# Patient Record
Sex: Male | Born: 1965 | Race: Black or African American | Hispanic: No | Smoking: Current every day smoker
Health system: Southern US, Community
[De-identification: ages and names within clinical notes are randomized; demographics above are authoritative.]

## PROBLEM LIST (undated history)

## (undated) DIAGNOSIS — J449 Chronic obstructive pulmonary disease, unspecified: Secondary | ICD-10-CM

## (undated) DIAGNOSIS — F32A Depression, unspecified: Secondary | ICD-10-CM

## (undated) DIAGNOSIS — M75101 Unspecified rotator cuff tear or rupture of right shoulder, not specified as traumatic: Secondary | ICD-10-CM

## (undated) DIAGNOSIS — J189 Pneumonia, unspecified organism: Secondary | ICD-10-CM

## (undated) DIAGNOSIS — M7750 Other enthesopathy of unspecified foot: Secondary | ICD-10-CM

## (undated) DIAGNOSIS — I2510 Atherosclerotic heart disease of native coronary artery without angina pectoris: Secondary | ICD-10-CM

## (undated) DIAGNOSIS — K219 Gastro-esophageal reflux disease without esophagitis: Secondary | ICD-10-CM

## (undated) DIAGNOSIS — I10 Essential (primary) hypertension: Secondary | ICD-10-CM

## (undated) DIAGNOSIS — G4730 Sleep apnea, unspecified: Secondary | ICD-10-CM

## (undated) DIAGNOSIS — I219 Acute myocardial infarction, unspecified: Secondary | ICD-10-CM

## (undated) DIAGNOSIS — E119 Type 2 diabetes mellitus without complications: Secondary | ICD-10-CM

## (undated) DIAGNOSIS — M1990 Unspecified osteoarthritis, unspecified site: Secondary | ICD-10-CM

## (undated) DIAGNOSIS — IMO0001 Reserved for inherently not codable concepts without codable children: Secondary | ICD-10-CM

## (undated) DIAGNOSIS — I509 Heart failure, unspecified: Secondary | ICD-10-CM

## (undated) DIAGNOSIS — J45909 Unspecified asthma, uncomplicated: Secondary | ICD-10-CM

## (undated) DIAGNOSIS — F329 Major depressive disorder, single episode, unspecified: Secondary | ICD-10-CM

## (undated) DIAGNOSIS — E785 Hyperlipidemia, unspecified: Secondary | ICD-10-CM

## (undated) DIAGNOSIS — D649 Anemia, unspecified: Secondary | ICD-10-CM

## (undated) HISTORY — DX: Type 2 diabetes mellitus without complications: E11.9

## (undated) HISTORY — PX: CORONARY ANGIOPLASTY: SHX604

## (undated) HISTORY — DX: Other enthesopathy of unspecified foot and ankle: M77.50

## (undated) HISTORY — PX: KNEE SURGERY: SHX244

---

## 2002-12-15 ENCOUNTER — Emergency Department (HOSPITAL_COMMUNITY): Admission: EM | Admit: 2002-12-15 | Discharge: 2002-12-15 | Payer: Self-pay | Admitting: Emergency Medicine

## 2005-01-27 ENCOUNTER — Emergency Department (HOSPITAL_COMMUNITY): Admission: EM | Admit: 2005-01-27 | Discharge: 2005-01-28 | Payer: Self-pay | Admitting: Emergency Medicine

## 2006-05-20 ENCOUNTER — Emergency Department (HOSPITAL_COMMUNITY): Admission: EM | Admit: 2006-05-20 | Discharge: 2006-05-20 | Payer: Self-pay | Admitting: Emergency Medicine

## 2006-08-16 ENCOUNTER — Emergency Department (HOSPITAL_COMMUNITY): Admission: EM | Admit: 2006-08-16 | Discharge: 2006-08-16 | Payer: Self-pay | Admitting: Emergency Medicine

## 2006-09-18 ENCOUNTER — Emergency Department (HOSPITAL_COMMUNITY): Admission: EM | Admit: 2006-09-18 | Discharge: 2006-09-18 | Payer: Self-pay | Admitting: Emergency Medicine

## 2006-09-25 ENCOUNTER — Emergency Department (HOSPITAL_COMMUNITY): Admission: EM | Admit: 2006-09-25 | Discharge: 2006-09-25 | Payer: Self-pay | Admitting: Emergency Medicine

## 2013-09-15 ENCOUNTER — Emergency Department (HOSPITAL_COMMUNITY): Payer: 59

## 2013-09-15 ENCOUNTER — Emergency Department (HOSPITAL_COMMUNITY)
Admission: EM | Admit: 2013-09-15 | Discharge: 2013-09-15 | Disposition: A | Discharge status: Home / Self Care | Payer: 59 | Attending: Emergency Medicine | Admitting: Emergency Medicine

## 2013-09-15 ENCOUNTER — Encounter (HOSPITAL_COMMUNITY): Payer: Self-pay | Admitting: Emergency Medicine

## 2013-09-15 DIAGNOSIS — X500XXA Overexertion from strenuous movement or load, initial encounter: Secondary | ICD-10-CM | POA: Insufficient documentation

## 2013-09-15 DIAGNOSIS — M1710 Unilateral primary osteoarthritis, unspecified knee: Secondary | ICD-10-CM

## 2013-09-15 DIAGNOSIS — M179 Osteoarthritis of knee, unspecified: Secondary | ICD-10-CM

## 2013-09-15 DIAGNOSIS — IMO0002 Reserved for concepts with insufficient information to code with codable children: Secondary | ICD-10-CM | POA: Insufficient documentation

## 2013-09-15 DIAGNOSIS — Y939 Activity, unspecified: Secondary | ICD-10-CM | POA: Insufficient documentation

## 2013-09-15 DIAGNOSIS — S025XXA Fracture of tooth (traumatic), initial encounter for closed fracture: Secondary | ICD-10-CM | POA: Insufficient documentation

## 2013-09-15 DIAGNOSIS — K0889 Other specified disorders of teeth and supporting structures: Secondary | ICD-10-CM

## 2013-09-15 DIAGNOSIS — S8990XA Unspecified injury of unspecified lower leg, initial encounter: Secondary | ICD-10-CM | POA: Insufficient documentation

## 2013-09-15 DIAGNOSIS — Y929 Unspecified place or not applicable: Secondary | ICD-10-CM | POA: Insufficient documentation

## 2013-09-15 DIAGNOSIS — Z79899 Other long term (current) drug therapy: Secondary | ICD-10-CM | POA: Insufficient documentation

## 2013-09-15 DIAGNOSIS — R51 Headache: Secondary | ICD-10-CM | POA: Insufficient documentation

## 2013-09-15 MED ORDER — TRAMADOL HCL 50 MG PO TABS
ORAL_TABLET | ORAL | Status: DC
Start: 2013-09-15 — End: 2014-12-29

## 2013-09-15 MED ORDER — IBUPROFEN 800 MG PO TABS: 800.0000 mg | ORAL_TABLET | Freq: Three times a day (TID) | ORAL | Status: DC

## 2013-09-15 MED ORDER — AMOXICILLIN 500 MG PO CAPS: ORAL_CAPSULE | ORAL | Status: DC

## 2013-09-15 NOTE — ED Provider Notes (Signed)
CSN: [id number]     Arrival date & time 09/15/13  1030 History   First MD Initiated Contact with Patient 09/15/13 1111     Chief Complaint  Patient presents with  . Dental Injury  . Knee Injury   (Consider location/radiation/quality/duration/timing/severity/associated sxs/prior Treatment) HPI Comments: Patient states that he is here not only because of a tooth that had a cavity in and broke, but he also twisted his knee on yesterday December 23. It is of note that the patient has had 2 previous operations on the knee. Patient wants to be reassured that he did not do any new damage to his knee.  Patient is a 47 y.o. male presenting with dental injury. The history is provided by the patient.  Dental Injury The current episode started 1 to 4 weeks ago. The problem occurs intermittently. The problem has been gradually worsening. Associated symptoms include arthralgias and headaches. Pertinent negatives include no abdominal pain, chest pain, chills, coughing, fever or neck pain. Exacerbated by: Eating and chewing. He has tried nothing for the symptoms. The treatment provided no relief.    No past medical history on file. No past surgical history on file. No family history on file. History  Substance Use Topics  . Smoking status: Not on file  . Smokeless tobacco: Not on file  . Alcohol Use: Not on file    Review of Systems  Constitutional: Negative for fever, chills and activity change.       All ROS Neg except as noted in HPI  HENT: Negative for nosebleeds.   Eyes: Negative for photophobia and discharge.  Respiratory: Negative for cough, shortness of breath and wheezing.   Cardiovascular: Negative for chest pain and palpitations.  Gastrointestinal: Negative for abdominal pain and blood in stool.  Genitourinary: Negative for dysuria, frequency and hematuria.  Musculoskeletal: Positive for arthralgias. Negative for back pain and neck pain.  Skin: Negative.   Neurological: Positive for  headaches. Negative for dizziness, seizures and speech difficulty.  Psychiatric/Behavioral: Negative for hallucinations and confusion.    Allergies  Review of patient's allergies indicates no known allergies.  Home Medications   Current Outpatient Rx  Name  Route  Sig  Dispense  Refill  . cloNIDine (CATAPRES) 0.3 MG tablet   Oral   Take 0.3 mg by mouth 2 (two) times daily.         Marland Kitchen FLUoxetine (PROZAC) 20 MG capsule   Oral   Take 20 mg by mouth daily.         . hydrALAZINE (APRESOLINE) 25 MG tablet   Oral   Take 20 mg by mouth 2 (two) times daily.         . hydrochlorothiazide (HYDRODIURIL) 25 MG tablet   Oral   Take 25 mg by mouth daily.         Marland Kitchen lisinopril (PRINIVIL,ZESTRIL) 40 MG tablet   Oral   Take 40 mg by mouth daily.         . metoprolol tartrate (LOPRESSOR) 25 MG tablet   Oral   Take 25 mg by mouth 2 (two) times daily.         Marland Kitchen amoxicillin (AMOXIL) 500 MG capsule      2 po bid with food   28 capsule   0   . ibuprofen (ADVIL,MOTRIN) 800 MG tablet   Oral   Take 1 tablet (800 mg total) by mouth 3 (three) times daily.   21 tablet   0   . traMADol (ULTRAM)  50 MG tablet      1 or 2 po q6h prn pain   20 tablet   0    BP 168/101  Pulse 72  Temp(Src) 98.1 F (36.7 C) (Oral)  Resp 19  Ht 5\' 7"  (1.702 m)  Wt 330 lb (149.687 kg)  BMI 51.67 kg/m2  SpO2 98% Physical Exam  Nursing note and vitals reviewed. Constitutional: He is oriented to person, place, and time. He appears well-developed and well-nourished.  Non-toxic appearance.  HENT:  Head: Normocephalic.  Right Ear: Tympanic membrane and external ear normal.  Left Ear: Tympanic membrane and external ear normal.  The left first premolar is decayed to the gum line. There is mild swelling of the gum there, but no abscess present.  No swelling under the tongue. The airway is patent.  Eyes: EOM and lids are normal. Pupils are equal, round, and reactive to light.  Neck: Normal range of  motion. Neck supple. Carotid bruit is not present.  Cardiovascular: Normal rate, regular rhythm, normal heart sounds, intact distal pulses and normal pulses.   Pulmonary/Chest: Breath sounds normal. No respiratory distress.  Abdominal: Soft. Bowel sounds are normal. There is no tenderness. There is no guarding.  Musculoskeletal: Normal range of motion.  There is soreness with flexion and extension of the left knee. There is crepitus present. There is no noted deformity. The knee is not hot.  Lymphadenopathy:       Head (right side): No submandibular adenopathy present.       Head (left side): No submandibular adenopathy present.    He has no cervical adenopathy.  Neurological: He is alert and oriented to person, place, and time. He has normal strength. No cranial nerve deficit or sensory deficit.  Skin: Skin is warm and dry.  Psychiatric: He has a normal mood and affect. His speech is normal.    ED Course  Procedures (including critical care time) Labs Review Labs Reviewed - No data to display Imaging Review Dg Knee Complete 4 Views Left  09/15/2013   CLINICAL DATA:  Left knee pain following injury  EXAM: LEFT KNEE - COMPLETE 4+ VIEW  COMPARISON:  None.  FINDINGS: Postsurgical changes are noted consistent with prior anterior cruciate repair. Severe degenerative changes are noted with medial joint space narrowing and osteophytic change. No sizable effusion is noted.  IMPRESSION: Chronic changes without acute abnormality.   Electronically Signed   By: Alcide Clever M.D.   On: 09/15/2013 11:30    EKG Interpretation   None       MDM   1. 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 There is decayed to the gum line at the first premolar on the left lower jaw area. No visible abscess. No evidence forLudwig's  angina. The airway is patent.  The patient has had 2 previous surgeries on the left knee, the x-ray of the left knee reveals degenerative changes, but no fracture, dislocation, or effusion.  The patient is treated with Amoxil, ibuprofen, and Ultram. Patient is advised to see his dentist in his primary physician when he returns to [place].  Kathie Dike, PA-C 09/15/13 1222

## 2013-09-15 NOTE — ED Provider Notes (Signed)
Medical screening examination/treatment/procedure(s) were performed by non-physician practitioner and as supervising physician I was immediately available for consultation/collaboration.  EKG Interpretation   None         Renette Hsu L Iria Jamerson, MD 09/15/13 1541 

## 2013-09-15 NOTE — ED Notes (Signed)
Patient reports: -he broke his tooth 2 weeks ago -he has not sought treatment for his tooth -he twisted his knee yesterday -pain to his tooth is 12/10 -pain to his (L) knee is 8/10

## 2014-12-12 ENCOUNTER — Telehealth: Payer: Self-pay | Admitting: Family Medicine

## 2014-12-13 NOTE — Telephone Encounter (Signed)
First available new patient appointment with Chris Powell is in June. There are available appointments with other providers towards the end of April. Left message for patient to call back with his preference.

## 2014-12-15 NOTE — Telephone Encounter (Signed)
Pt given new pt appt with Jannifer Rodneyhristy hawks 5/4 at 10:25. Pt needs to establish care and he is aware to arrive 15 minutes prior to appt with a copy of insurance card and a current list of medications.

## 2014-12-21 ENCOUNTER — Encounter (HOSPITAL_COMMUNITY): Payer: Self-pay | Admitting: Emergency Medicine

## 2014-12-21 ENCOUNTER — Inpatient Hospital Stay (HOSPITAL_COMMUNITY)
Admission: EM | Admit: 2014-12-21 | Discharge: 2014-12-23 | DRG: 247 | Disposition: A | Discharge status: Home / Self Care | Payer: Medicaid Other | Attending: Cardiology | Admitting: Cardiology

## 2014-12-21 DIAGNOSIS — Z79899 Other long term (current) drug therapy: Secondary | ICD-10-CM

## 2014-12-21 DIAGNOSIS — Z6841 Body Mass Index (BMI) 40.0 and over, adult: Secondary | ICD-10-CM

## 2014-12-21 DIAGNOSIS — Z791 Long term (current) use of non-steroidal anti-inflammatories (NSAID): Secondary | ICD-10-CM

## 2014-12-21 DIAGNOSIS — J449 Chronic obstructive pulmonary disease, unspecified: Secondary | ICD-10-CM | POA: Diagnosis present

## 2014-12-21 DIAGNOSIS — F17200 Nicotine dependence, unspecified, uncomplicated: Secondary | ICD-10-CM | POA: Diagnosis present

## 2014-12-21 DIAGNOSIS — Z8249 Family history of ischemic heart disease and other diseases of the circulatory system: Secondary | ICD-10-CM

## 2014-12-21 DIAGNOSIS — I4729 Other ventricular tachycardia: Secondary | ICD-10-CM

## 2014-12-21 DIAGNOSIS — R079 Chest pain, unspecified: Secondary | ICD-10-CM

## 2014-12-21 DIAGNOSIS — J441 Chronic obstructive pulmonary disease with (acute) exacerbation: Secondary | ICD-10-CM | POA: Diagnosis present

## 2014-12-21 DIAGNOSIS — F17210 Nicotine dependence, cigarettes, uncomplicated: Secondary | ICD-10-CM | POA: Diagnosis present

## 2014-12-21 DIAGNOSIS — I249 Acute ischemic heart disease, unspecified: Secondary | ICD-10-CM | POA: Diagnosis present

## 2014-12-21 DIAGNOSIS — J45909 Unspecified asthma, uncomplicated: Secondary | ICD-10-CM | POA: Diagnosis present

## 2014-12-21 DIAGNOSIS — M1990 Unspecified osteoarthritis, unspecified site: Secondary | ICD-10-CM | POA: Diagnosis present

## 2014-12-21 DIAGNOSIS — I10 Essential (primary) hypertension: Secondary | ICD-10-CM | POA: Diagnosis present

## 2014-12-21 DIAGNOSIS — Z9861 Coronary angioplasty status: Secondary | ICD-10-CM

## 2014-12-21 DIAGNOSIS — I214 Non-ST elevation (NSTEMI) myocardial infarction: Principal | ICD-10-CM | POA: Diagnosis present

## 2014-12-21 DIAGNOSIS — E876 Hypokalemia: Secondary | ICD-10-CM | POA: Diagnosis not present

## 2014-12-21 DIAGNOSIS — I472 Ventricular tachycardia: Secondary | ICD-10-CM | POA: Diagnosis present

## 2014-12-21 DIAGNOSIS — Z955 Presence of coronary angioplasty implant and graft: Secondary | ICD-10-CM

## 2014-12-21 DIAGNOSIS — I2510 Atherosclerotic heart disease of native coronary artery without angina pectoris: Secondary | ICD-10-CM | POA: Diagnosis present

## 2014-12-21 HISTORY — DX: Unspecified asthma, uncomplicated: J45.909

## 2014-12-21 HISTORY — DX: Atherosclerotic heart disease of native coronary artery without angina pectoris: I25.10

## 2014-12-21 HISTORY — DX: Unspecified osteoarthritis, unspecified site: M19.90

## 2014-12-21 HISTORY — DX: Gastro-esophageal reflux disease without esophagitis: K21.9

## 2014-12-21 HISTORY — DX: Pneumonia, unspecified organism: J18.9

## 2014-12-21 HISTORY — DX: Reserved for inherently not codable concepts without codable children: IMO0001

## 2014-12-21 HISTORY — DX: Chronic obstructive pulmonary disease, unspecified: J44.9

## 2014-12-21 HISTORY — DX: Essential (primary) hypertension: I10

## 2014-12-21 HISTORY — DX: Depression, unspecified: F32.A

## 2014-12-21 HISTORY — DX: Major depressive disorder, single episode, unspecified: F32.9

## 2014-12-21 HISTORY — DX: Sleep apnea, unspecified: G47.30

## 2014-12-21 NOTE — ED Notes (Signed)
Pt. C/o chest pain starting Saturday. Pt. Reports starting on Vit D 50,000U x3 weeks that he is to take once a week. Pt. Reports being instructed by [facility] to stop taking medication.

## 2014-12-22 ENCOUNTER — Encounter (HOSPITAL_COMMUNITY)
Admission: EM | Disposition: A | Discharge status: Home / Self Care | Payer: Self-pay | Source: Home / Self Care | Attending: Cardiology

## 2014-12-22 ENCOUNTER — Emergency Department (HOSPITAL_COMMUNITY): Payer: Medicaid Other

## 2014-12-22 ENCOUNTER — Encounter (HOSPITAL_COMMUNITY): Payer: Self-pay | Admitting: Cardiovascular Disease

## 2014-12-22 DIAGNOSIS — R7989 Other specified abnormal findings of blood chemistry: Secondary | ICD-10-CM | POA: Diagnosis not present

## 2014-12-22 DIAGNOSIS — M1990 Unspecified osteoarthritis, unspecified site: Secondary | ICD-10-CM | POA: Diagnosis present

## 2014-12-22 DIAGNOSIS — Z8249 Family history of ischemic heart disease and other diseases of the circulatory system: Secondary | ICD-10-CM | POA: Diagnosis not present

## 2014-12-22 DIAGNOSIS — I2510 Atherosclerotic heart disease of native coronary artery without angina pectoris: Secondary | ICD-10-CM | POA: Diagnosis present

## 2014-12-22 DIAGNOSIS — Z791 Long term (current) use of non-steroidal anti-inflammatories (NSAID): Secondary | ICD-10-CM | POA: Diagnosis not present

## 2014-12-22 DIAGNOSIS — I2102 ST elevation (STEMI) myocardial infarction involving left anterior descending coronary artery: Secondary | ICD-10-CM | POA: Diagnosis not present

## 2014-12-22 DIAGNOSIS — Z79899 Other long term (current) drug therapy: Secondary | ICD-10-CM | POA: Diagnosis not present

## 2014-12-22 DIAGNOSIS — I214 Non-ST elevation (NSTEMI) myocardial infarction: Secondary | ICD-10-CM | POA: Diagnosis present

## 2014-12-22 DIAGNOSIS — E876 Hypokalemia: Secondary | ICD-10-CM | POA: Diagnosis present

## 2014-12-22 DIAGNOSIS — I472 Ventricular tachycardia: Secondary | ICD-10-CM | POA: Diagnosis present

## 2014-12-22 DIAGNOSIS — I209 Angina pectoris, unspecified: Secondary | ICD-10-CM

## 2014-12-22 DIAGNOSIS — R9431 Abnormal electrocardiogram [ECG] [EKG]: Secondary | ICD-10-CM

## 2014-12-22 DIAGNOSIS — J449 Chronic obstructive pulmonary disease, unspecified: Secondary | ICD-10-CM | POA: Diagnosis present

## 2014-12-22 DIAGNOSIS — F17210 Nicotine dependence, cigarettes, uncomplicated: Secondary | ICD-10-CM | POA: Diagnosis present

## 2014-12-22 DIAGNOSIS — I213 ST elevation (STEMI) myocardial infarction of unspecified site: Secondary | ICD-10-CM | POA: Diagnosis not present

## 2014-12-22 DIAGNOSIS — J45909 Unspecified asthma, uncomplicated: Secondary | ICD-10-CM | POA: Diagnosis present

## 2014-12-22 DIAGNOSIS — R079 Chest pain, unspecified: Secondary | ICD-10-CM | POA: Diagnosis present

## 2014-12-22 DIAGNOSIS — I249 Acute ischemic heart disease, unspecified: Secondary | ICD-10-CM | POA: Diagnosis present

## 2014-12-22 DIAGNOSIS — I10 Essential (primary) hypertension: Secondary | ICD-10-CM | POA: Diagnosis present

## 2014-12-22 DIAGNOSIS — Z6841 Body Mass Index (BMI) 40.0 and over, adult: Secondary | ICD-10-CM | POA: Diagnosis not present

## 2014-12-22 HISTORY — PX: CORONARY STENT PLACEMENT: SHX1402

## 2014-12-22 HISTORY — PX: CARDIAC CATHETERIZATION: SHX172

## 2014-12-22 HISTORY — PX: LEFT HEART CATHETERIZATION WITH CORONARY ANGIOGRAM: SHX5451

## 2014-12-22 LAB — CBC
HCT: 45.7 % (ref 39.0–52.0)
Hemoglobin: 15.5 g/dL (ref 13.0–17.0)
MCH: 30.8 pg (ref 26.0–34.0)
MCHC: 33.9 g/dL (ref 30.0–36.0)
MCV: 90.7 fL (ref 78.0–100.0)
PLATELETS: 265 10*3/uL (ref 150–400)
RBC: 5.04 MIL/uL (ref 4.22–5.81)
RDW: 14.3 % (ref 11.5–15.5)
WBC: 9.9 10*3/uL (ref 4.0–10.5)

## 2014-12-22 LAB — BASIC METABOLIC PANEL
Anion gap: 9 (ref 5–15)
BUN: 11 mg/dL (ref 6–23)
CHLORIDE: 107 mmol/L (ref 96–112)
CO2: 25 mmol/L (ref 19–32)
CREATININE: 1.15 mg/dL (ref 0.50–1.35)
Calcium: 8.9 mg/dL (ref 8.4–10.5)
GFR calc Af Amer: 85 mL/min — ABNORMAL LOW (ref >90)
GFR calc non Af Amer: 74 mL/min — ABNORMAL LOW (ref >90)
GLUCOSE: 118 mg/dL — AB (ref 70–99)
POTASSIUM: 3.3 mmol/L — AB (ref 3.5–5.1)
Sodium: 141 mmol/L (ref 135–145)

## 2014-12-22 LAB — PROTIME-INR
INR: 1.03 (ref 0.00–1.49)
Prothrombin Time: 13.7 seconds (ref 11.6–15.2)

## 2014-12-22 LAB — TROPONIN I
Troponin I: 0.05 ng/mL — ABNORMAL HIGH (ref <0.031)
Troponin I: 0.07 ng/mL — ABNORMAL HIGH (ref <0.031)

## 2014-12-22 LAB — PLATELET COUNT: PLATELETS: 284 10*3/uL (ref 150–400)

## 2014-12-22 LAB — POCT ACTIVATED CLOTTING TIME
ACTIVATED CLOTTING TIME: 245 s
Activated Clotting Time: 300 seconds

## 2014-12-22 SURGERY — LEFT HEART CATHETERIZATION WITH CORONARY ANGIOGRAM

## 2014-12-22 MED ORDER — VERAPAMIL HCL 2.5 MG/ML IV SOLN
INTRAVENOUS | Status: AC
  Filled 2014-12-22: qty 2

## 2014-12-22 MED ORDER — TIROFIBAN HCL IV 12.5 MG/250 ML
INTRAVENOUS | Status: AC
  Administered 2014-12-22: 12:00:00 22.92 ug/min via INTRAVENOUS
  Filled 2014-12-22: qty 250

## 2014-12-22 MED ORDER — SODIUM CHLORIDE 0.9 % IJ SOLN: 3.0000 mL | INTRAMUSCULAR | Status: DC | PRN

## 2014-12-22 MED ORDER — ASPIRIN 81 MG PO CHEW
81.0000 mg | CHEWABLE_TABLET | ORAL | Status: DC
Start: 2014-12-23 — End: 2014-12-22

## 2014-12-22 MED ORDER — HEPARIN SODIUM (PORCINE) 1000 UNIT/ML IJ SOLN
INTRAMUSCULAR | Status: AC
  Filled 2014-12-22: qty 1

## 2014-12-22 MED ORDER — ONDANSETRON HCL 4 MG/2ML IJ SOLN: 4.0000 mg | Freq: Four times a day (QID) | INTRAMUSCULAR | Status: DC | PRN

## 2014-12-22 MED ORDER — ACETAMINOPHEN 325 MG PO TABS: 650.0000 mg | ORAL_TABLET | ORAL | Status: DC | PRN

## 2014-12-22 MED ORDER — TRAMADOL HCL 50 MG PO TABS
50.0000 mg | ORAL_TABLET | Freq: Four times a day (QID) | ORAL | Status: DC | PRN
  Administered 2014-12-22 (×2): 50 mg via ORAL
  Filled 2014-12-22 (×2): qty 1

## 2014-12-22 MED ORDER — HYDRALAZINE HCL 20 MG/ML IJ SOLN
INTRAMUSCULAR | Status: AC
  Filled 2014-12-22: qty 1

## 2014-12-22 MED ORDER — CLONIDINE HCL 0.3 MG PO TABS
0.3000 mg | ORAL_TABLET | Freq: Two times a day (BID) | ORAL | Status: DC
  Administered 2014-12-22 – 2014-12-23 (×2): 0.3 mg via ORAL
  Filled 2014-12-22 (×3): qty 1

## 2014-12-22 MED ORDER — LIDOCAINE HCL (PF) 1 % IJ SOLN
INTRAMUSCULAR | Status: AC
  Filled 2014-12-22: qty 30

## 2014-12-22 MED ORDER — GI COCKTAIL ~~LOC~~
30.0000 mL | Freq: Once | ORAL | Status: AC
  Administered 2014-12-22: 30 mL via ORAL
  Filled 2014-12-22: qty 30

## 2014-12-22 MED ORDER — SODIUM CHLORIDE 0.9 % IV SOLN: 250.0000 mL | INTRAVENOUS | Status: DC | PRN

## 2014-12-22 MED ORDER — SODIUM CHLORIDE 0.9 % IV SOLN
1.0000 mL/kg/h | INTRAVENOUS | Status: AC
  Administered 2014-12-22: 12:00:00 0.636 mL/kg/h via INTRAVENOUS

## 2014-12-22 MED ORDER — TIROFIBAN HCL IV 5 MG/100ML
22.9200 ug/min | INTRAVENOUS | Status: AC
  Administered 2014-12-22: 22.92 ug/min via INTRAVENOUS
  Filled 2014-12-22: qty 100

## 2014-12-22 MED ORDER — FLUOXETINE HCL 20 MG PO CAPS: 20.0000 mg | ORAL_CAPSULE | Freq: Every day | ORAL | Status: DC

## 2014-12-22 MED ORDER — HEPARIN (PORCINE) IN NACL 2-0.9 UNIT/ML-% IJ SOLN
INTRAMUSCULAR | Status: AC
  Filled 2014-12-22: qty 1000

## 2014-12-22 MED ORDER — SODIUM CHLORIDE 0.9 % IJ SOLN: 3.0000 mL | Freq: Two times a day (BID) | INTRAMUSCULAR | Status: DC

## 2014-12-22 MED ORDER — MIDAZOLAM HCL 2 MG/2ML IJ SOLN
INTRAMUSCULAR | Status: AC
  Filled 2014-12-22: qty 2

## 2014-12-22 MED ORDER — HYDROCHLOROTHIAZIDE 25 MG PO TABS
25.0000 mg | ORAL_TABLET | Freq: Every day | ORAL | Status: DC
  Administered 2014-12-22 – 2014-12-23 (×2): 25 mg via ORAL
  Filled 2014-12-22 (×2): qty 1

## 2014-12-22 MED ORDER — PRASUGREL HCL 10 MG PO TABS
ORAL_TABLET | ORAL | Status: AC
  Filled 2014-12-22: qty 6

## 2014-12-22 MED ORDER — POTASSIUM CHLORIDE CRYS ER 20 MEQ PO TBCR: 40.0000 meq | EXTENDED_RELEASE_TABLET | Freq: Once | ORAL | Status: DC

## 2014-12-22 MED ORDER — FENTANYL CITRATE 0.05 MG/ML IJ SOLN
INTRAMUSCULAR | Status: AC
  Filled 2014-12-22: qty 2

## 2014-12-22 MED ORDER — ANGIOPLASTY BOOK
Freq: Once | Status: AC
  Administered 2014-12-22: 21:00:00
  Filled 2014-12-22: qty 1

## 2014-12-22 MED ORDER — HYDRALAZINE HCL 10 MG PO TABS
20.0000 mg | ORAL_TABLET | Freq: Two times a day (BID) | ORAL | Status: DC
  Administered 2014-12-22 – 2014-12-23 (×2): 20 mg via ORAL
  Filled 2014-12-22 (×3): qty 2

## 2014-12-22 MED ORDER — SODIUM CHLORIDE 0.9 % IV SOLN: INTRAVENOUS | Status: DC

## 2014-12-22 MED ORDER — PRASUGREL HCL 10 MG PO TABS
10.0000 mg | ORAL_TABLET | Freq: Every day | ORAL | Status: DC
  Administered 2014-12-23: 10 mg via ORAL
  Filled 2014-12-22: qty 1

## 2014-12-22 MED ORDER — METOPROLOL TARTRATE 25 MG PO TABS
25.0000 mg | ORAL_TABLET | Freq: Two times a day (BID) | ORAL | Status: DC
  Administered 2014-12-22 – 2014-12-23 (×3): 25 mg via ORAL
  Filled 2014-12-22 (×3): qty 1

## 2014-12-22 MED ORDER — ASPIRIN 81 MG PO CHEW
81.0000 mg | CHEWABLE_TABLET | Freq: Every day | ORAL | Status: DC
  Administered 2014-12-23: 11:00:00 81 mg via ORAL
  Filled 2014-12-22: qty 1

## 2014-12-22 MED ORDER — ASPIRIN 81 MG PO CHEW
162.0000 mg | CHEWABLE_TABLET | Freq: Once | ORAL | Status: AC
  Administered 2014-12-22: 162 mg via ORAL
  Filled 2014-12-22: qty 2

## 2014-12-22 MED ORDER — NITROGLYCERIN 1 MG/10 ML FOR IR/CATH LAB
INTRA_ARTERIAL | Status: AC
  Filled 2014-12-22: qty 10

## 2014-12-22 NOTE — Progress Notes (Signed)
Utilization review completed.  

## 2014-12-22 NOTE — ED Notes (Signed)
Pt. Sleeping. Even rise and fall of chest noted. No distress noted.

## 2014-12-22 NOTE — ED Notes (Signed)
Pt. Given urinal. 

## 2014-12-22 NOTE — ED Provider Notes (Signed)
CSN: [id number]     Arrival date & time 12/21/14  2351 History  This chart was scribed for Chris Racer, MD by [name], ED Scribe. This patient was seen in room [facility]04/[facility]04 and the patient's care was started at 12:11 AM.    Chief Complaint  Patient presents with  . Chest Pain   The history is provided by the patient. No language interpreter was used.    HPI Comments: Chris Powell is a 49 y.o. male with a history of HTN, COPD, asthma and arthritis who presents to the Emergency Department complaining of intermittent, gradually worsening, non-radiating, tight and stabbing chest pain that started 4 days ago and became worse tonight. Pt reports 5 episodes of CP PTA. He states SOB and feeling warm during the episodes as associated symptoms. Pt reports pain becomes worse with lying down. He also notes that initial episodes of pain lasted a few minutes before resolving without treatment, but that they have increased in length and severity in the last few days. Pt's last meal was a hamburger 8 hours ago. Pt reports that he was taking Vitamin D for 2 weeks, but stopped treatment 2 days ago because of the onset of chest tightness. He denies prior evaluations by a cardiologist. Pt denies a family history of CAD <55 y.o., recent surgeries or long travel. He smokes 1/2 ppd. Pt denies nausea as an associated symptoms.  Past Medical History  Diagnosis Date  . Hypertension   . COPD (chronic obstructive pulmonary disease)   . Asthma   . Arthritis    Past Surgical History  Procedure Laterality Date  . Knee surgery     No family history on file. History  Substance Use Topics  . Smoking status: Current Every Day Smoker -- 0.50 packs/day    Types: Cigarettes  . Smokeless tobacco: Not on file  . Alcohol Use: Yes     Comment: occ.    Review of Systems  Constitutional: Negative for fever and chills.  Respiratory: Positive for shortness of breath. Negative for cough.   Cardiovascular:  Positive for chest pain. Negative for palpitations and leg swelling.  Gastrointestinal: Positive for abdominal pain. Negative for nausea, vomiting and diarrhea.  Musculoskeletal: Negative for back pain, neck pain and neck stiffness.  Skin: Negative for wound.  Neurological: Negative for dizziness, weakness, light-headedness, numbness and headaches.  All other systems reviewed and are negative.     Allergies  Review of patient's allergies indicates no known allergies.  Home Medications   Prior to Admission medications   Medication Sig Start Date End Date Taking? Authorizing Provider  cloNIDine (CATAPRES) 0.3 MG tablet Take 0.3 mg by mouth 2 (two) times daily.   Yes Historical Provider, MD  hydrALAZINE (APRESOLINE) 25 MG tablet Take 20 mg by mouth 2 (two) times daily.   Yes Historical Provider, MD  hydrochlorothiazide (HYDRODIURIL) 25 MG tablet Take 25 mg by mouth daily.   Yes Historical Provider, MD  lisinopril (PRINIVIL,ZESTRIL) 40 MG tablet Take 40 mg by mouth daily.   Yes Historical Provider, MD  metoprolol tartrate (LOPRESSOR) 25 MG tablet Take 25 mg by mouth 2 (two) times daily.   Yes Historical Provider, MD  amoxicillin (AMOXIL) 500 MG capsule 2 po bid with food 09/15/13   [name], PA-C  FLUoxetine (PROZAC) 20 MG capsule Take 20 mg by mouth daily.    Historical Provider, MD  ibuprofen (ADVIL,MOTRIN) 800 MG tablet Take 1 tablet (800 mg total) by mouth 3 (three) times daily. 09/15/13  Ivery QualeHobson Bryant, PA-C  traMADol (ULTRAM) 50 MG tablet 1 or 2 po q6h prn pain 09/15/13   Ivery QualeHobson Bryant, PA-C   BP 122/82 mmHg  Pulse 60  Temp(Src) 98 F (36.7 C) (Oral)  Resp 21  Ht 5\' 7"  (1.702 m)  Wt 330 lb (149.687 kg)  BMI 51.67 kg/m2  SpO2 94% Physical Exam  Constitutional: He is oriented to person, place, and time. He appears well-developed and well-nourished. No distress.  Obese  HENT:  Head: Normocephalic and atraumatic.  Mouth/Throat: Oropharynx is clear and moist.  Eyes: EOM  are normal. Pupils are equal, round, and reactive to light.  Neck: Normal range of motion. Neck supple.  Cardiovascular: Normal rate and regular rhythm.   Pulmonary/Chest: Effort normal and breath sounds normal. No respiratory distress. He has no wheezes. He has no rales. He exhibits no tenderness.  Abdominal: Soft. Bowel sounds are normal. He exhibits no distension and no mass. There is tenderness (epigastric tenderness with palpation.). There is no rebound and no guarding.  Musculoskeletal: Normal range of motion. He exhibits no edema or tenderness.  No calf swelling or tenderness.  Neurological: He is alert and oriented to person, place, and time.  Moves all extremities without deficit. Sensation is grossly intact.  Skin: Skin is warm and dry. No rash noted. No erythema.  Psychiatric: He has a normal mood and affect. His behavior is normal.  Nursing note and vitals reviewed.   ED Course  Procedures   DIAGNOSTIC STUDIES: Oxygen Saturation is 96% on RA, normal by my interpretation.    COORDINATION OF CARE: 12:17 AM Discussed treatment plan with pt at bedside and pt agreed to plan.  Labs Review Labs Reviewed  BASIC METABOLIC PANEL - Abnormal; Notable for the following:    Potassium 3.3 (*)    Glucose, Bld 118 (*)    GFR calc non Af Amer 74 (*)    GFR calc Af Amer 85 (*)    All other components within normal limits  TROPONIN I - Abnormal; Notable for the following:    Troponin I 0.05 (*)    All other components within normal limits  TROPONIN I - Abnormal; Notable for the following:    Troponin I 0.07 (*)    All other components within normal limits  CBC    Imaging Review Dg Chest 2 View (if Patient Has Fever And/or Copd)  12/22/2014   CLINICAL DATA:  Sharp central chest pain for 1 week. Initial encounter.  EXAM: CHEST  2 VIEW  COMPARISON:  Chest radiograph performed 05/20/2006  FINDINGS: The lungs are well-aerated. Mild bibasilar opacities may reflect mild interstitial edema,  given underlying vascular congestion. There is no evidence of pleural effusion or pneumothorax.  The heart is borderline normal in size. No acute osseous abnormalities are seen.  IMPRESSION: Mild bibasilar airspace opacities may reflect mild interstitial edema, given underlying vascular congestion.   Electronically Signed   By: Roanna RaiderJeffery  Chang M.D.   On: 12/22/2014 01:14     EKG Interpretation   Date/Time:  Wednesday December 21 2014 23:55:08 EDT Ventricular Rate:  72 PR Interval:  127 QRS Duration: 103 QT Interval:  365 QTC Calculation: 399 R Axis:   64 Text Interpretation:  Sinus rhythm Borderline T wave abnormalities  Confirmed by Ranae PalmsYELVERTON  MD, Keyri Salberg ([id number]) on 12/22/2014 12:49:58 AM      MDM   Final diagnoses:  Chest pain, unspecified chest pain type    I personally performed the services described in this documentation,  which was scribed in my presence. The recorded information has been reviewed and is accurate.  Patient remains asymptomatic in the emergency department. Mild elevation in initial troponin and repeat at 3 hours. EKG with nonspecific T wave abnormalities. Patient has heart score of 5. Discuss with cardiology on-call, Dr. Tresa Endo. Will accept in transfer to telemetry bed for rule out and likely stress testing. Patient is in agreement with plan.   Chris Racer, MD 12/22/14 [id number]

## 2014-12-22 NOTE — Interval H&P Note (Signed)
Cath Lab Visit (complete for each Cath Lab visit)  Clinical Evaluation Leading to the Procedure:   ACS: Yes.    Non-ACS:    Anginal Classification: CCS IV  Anti-ischemic medical therapy: No Therapy  Non-Invasive Test Results: No non-invasive testing performed  Prior CABG: No previous CABG   TIMI Score  Patient Information:  TIMI Score is 4   UA/NSTEMI and intermediate-risk features (e.g., TIMI score 3-4) for short-term risk of death or nonfatal MI  Revascularization of the presumed culprit artery   A (8)  Indication: 10; Score: 8    History and Physical Interval Note:  12/22/2014 9:45 AM  Chris Powell  has presented today for surgery, with the diagnosis of cp  The various methods of treatment have been discussed with the patient and family. After consideration of risks, benefits and other options for treatment, the patient has consented to  Procedure(s): LEFT HEART CATHETERIZATION WITH CORONARY ANGIOGRAM (N/A) as a surgical intervention .  The patient's history has been reviewed, patient examined, no change in status, stable for surgery.  I have reviewed the patient's chart and labs.  Questions were answered to the patient's satisfaction.     [name] S.

## 2014-12-22 NOTE — H&P (Signed)
ADMISSION HISTORY AND PHYSICAL   Date: 12/22/2014               Patient Name:  Chris Powell MRN: [id number]  DOB: [birth year] Age / Sex: 49 y.o., male        PCP: No PCP Per Patient Primary Cardiologist: New / [place]         History of Present Illness: Patient is a 49 y.o. male with a PMHx of HTN, COPD, as, who was admitted to [facility] on 12/21/2014 for evaluation of CHEST PAIN   Pain started 4 days ago, worsened last night. Pain is worse with lying down.  Troponin levels minimally elevated.  ECG shows NSR with TWI in the anterior leads. .   Is on disability for HTN   Pain is worse over the past several days Not worsened with exertion - does not get any regular exercise Not pleuretic, Not positional  Thought it was due to indigestion.  Did not improve with antiacids.  Some increase dyspnea. No sweats.   Smokes 1 ppd - does not intend to quit + family hx of CAD - father died of mi.   Medications: Outpatient medications: Prescriptions prior to admission  Medication Sig Dispense Refill Last Dose  . cloNIDine (CATAPRES) 0.3 MG tablet Take 0.3 mg by mouth 2 (two) times daily.   12/21/2014 at Unknown time  . hydrALAZINE (APRESOLINE) 25 MG tablet Take 20 mg by mouth 2 (two) times daily.   12/21/2014 at Unknown time  . hydrochlorothiazide (HYDRODIURIL) 25 MG tablet Take 25 mg by mouth daily.   12/21/2014 at Unknown time  . lisinopril (PRINIVIL,ZESTRIL) 40 MG tablet Take 40 mg by mouth daily.   12/21/2014 at Unknown time  . metoprolol tartrate (LOPRESSOR) 25 MG tablet Take 25 mg by mouth 2 (two) times daily.   12/21/2014 at Unknown time  . amoxicillin (AMOXIL) 500 MG capsule 2 po bid with food 28 capsule 0   . FLUoxetine (PROZAC) 20 MG capsule Take 20 mg by mouth daily.   09/15/2013  . ibuprofen (ADVIL,MOTRIN) 800 MG tablet Take 1 tablet (800 mg total) by mouth 3 (three) times daily. 21 tablet 0   . traMADol (ULTRAM) 50 MG tablet 1 or 2 po q6h prn pain 20 tablet 0     No Known  Allergies   Past Medical History  Diagnosis Date  . Hypertension   . COPD (chronic obstructive pulmonary disease)   . Asthma   . Arthritis     Past Surgical History  Procedure Laterality Date  . Knee surgery      Family History  Problem Relation Age of Onset  . Heart attack Father     Social History:  reports that he has been smoking Cigarettes.  He has been smoking about 0.50 packs per day. He does not have any smokeless tobacco history on file. He reports that he drinks alcohol. He reports that he uses illicit drugs (Cocaine).   Review of Systems: Constitutional:  denies fever, chills, diaphoresis, appetite change and fatigue.  HEENT: denies photophobia, eye pain, redness, hearing loss, ear pain, congestion, sore throat, rhinorrhea, sneezing, neck pain, neck stiffness and tinnitus.  Respiratory: denies SOB, DOE, cough, chest tightness, and wheezing.  Cardiovascular: admits to chest pain,    Gastrointestinal: denies nausea, vomiting, abdominal pain, diarrhea, constipation, blood in stool.  Genitourinary: denies dysuria, urgency, frequency, hematuria, flank pain and difficulty urinating.  Musculoskeletal: denies  myalgias, back pain, joint swelling, arthralgias and  gait problem.   Skin: denies pallor, rash and wound.  Neurological: denies dizziness, seizures, syncope, weakness, light-headedness, numbness and headaches.   Hematological: denies adenopathy, easy bruising, personal or family bleeding history.  Psychiatric/ Behavioral: denies suicidal ideation, mood changes, confusion, nervousness, sleep disturbance and agitation.    Physical Exam: BP 150/94 mmHg  Pulse 65  Temp(Src) 98.6 F (37 C) (Oral)  Resp 22  Ht 5\' 7"  (1.702 m)  Wt 346 lb 12.8 oz (157.307 kg)  BMI 54.30 kg/m2  SpO2 100%  Wt Readings from Last 3 Encounters:  12/22/14 346 lb 12.8 oz (157.307 kg)  09/15/13 330 lb (149.687 kg)    General: Vital signs reviewed and noted. Well-developed,  well-nourished, in no acute distress; alert,   Head: Normocephalic, atraumatic, sclera anicteric, mucus membranes are moist   Neck: Supple. Negative for carotid bruits. JVD not elevated.   Lungs:  Clear bilaterally to auscultation without wheezes, rales, or rhonchi. Breathing is normal   Heart: RRR with S1 S2. No murmurs, rubs, or gallops.   Abdomen:  Soft, non-tender, non-distended with normoactive bowel sounds. No hepatomegaly. No rebound/guarding. No obvious abdominal masses   MSK: Strength and the appear normal for age.   Extremities: No clubbing or cyanosis. No edema.  Distal pedal pulses are 2+ and equal bilaterally .  Neurologic: Alert and oriented X 3. Moves all extremities spontaneously   Psych:  normal     Lab results: Basic Metabolic Panel:  Recent Labs Lab 12/22/14 0028  NA 141  K 3.3*  CL 107  CO2 25  GLUCOSE 118*  BUN 11  CREATININE 1.15  CALCIUM 8.9    Liver Function Tests: No results for input(s): AST, ALT, ALKPHOS, BILITOT, PROT, ALBUMIN in the last 168 hours. No results for input(s): LIPASE, AMYLASE in the last 168 hours.  CBC:  Recent Labs Lab 12/22/14 0028  WBC 9.9  HGB 15.5  HCT 45.7  MCV 90.7  PLT 265    Cardiac Enzymes:  Recent Labs Lab 12/22/14 0028 12/22/14 0328  TROPONINI 0.05* 0.07*    BNP: Invalid input(s): POCBNP  CBG: No results for input(s): GLUCAP in the last 168 hours.  Coagulation Studies: No results for input(s): LABPROT, INR in the last 72 hours.   Other results: EKG :  Reviewed by me NSR,  TWI in the anterior leads.   Imaging: Dg Chest 2 View (if Patient Has Fever And/or Copd)  12/22/2014   CLINICAL DATA:  Sharp central chest pain for 1 week. Initial encounter.  EXAM: CHEST  2 VIEW  COMPARISON:  Chest radiograph performed 05/20/2006  FINDINGS: The lungs are well-aerated. Mild bibasilar opacities may reflect mild interstitial edema, given underlying vascular congestion. There is no evidence of pleural effusion or  pneumothorax.  The heart is borderline normal in size. No acute osseous abnormalities are seen.  IMPRESSION: Mild bibasilar airspace opacities may reflect mild interstitial edema, given underlying vascular congestion.   Electronically Signed   By: Roanna RaiderJeffery  Chang M.D.   On: 12/22/2014 01:14     Assessment & Plan:  1. Chest discomfort:  Pt has had progressive CP for the past several days.  Now is found to have a minimally + troponin level and mild ECG abn.  Risk factos include smoking, obesity, HTN, family hx.  doesnot get any exercise  I think our best option is to proceed with cath.  I've discussed risks, benefits, options.  He understands and agrees to proceed.   2. Hypokalemia:  Will replace  3. HTN:  Continue meds.  Will adjust as needed.  4. Smoking :  Advised cessation  5.  Morbid obesity:  Will need to work on an improved diet. Hb is on the high side, he may have OSA or obesity hypoventilation.    DVT PPX -    Alvia Grove., MD, [facility] 12/22/2014, 7:56 AM

## 2014-12-22 NOTE — ED Notes (Signed)
EDP at bedside  

## 2014-12-22 NOTE — CV Procedure (Addendum)
PROCEDURE:  Left heart catheterization with selective coronary angiography, PCI LAD.  INDICATIONS:  NSTEMI  The risks, benefits, and details of the procedure were explained to the patient.  The patient verbalized understanding and wanted to proceed.  Informed written consent was obtained.  PROCEDURE TECHNIQUE:  After Xylocaine anesthesia a 29F slender sheath was placed in the right radial artery with a single anterior needle wall stick.   IV Heparin was given.  Right coronary angiography was attempted using a Judkins R4 guide catheter.  We then tried a Williams right catheter but were unsuccessful. We then tried an AL-1 catheter which was unsuccessful. We then tried an AR 1 catheter which was unsuccessful. We then tried an AR-2 catheter which did engage the RCA.  Left coronary angiography was done using an AL1 guide catheter.  Left ventriculography was not done.  Left heart cath was done using the JL3.5 catheter.  The intervention was performed. Please see below for details. A TR band was used for hemostasis.   CONTRAST:  Total of 265 cc.  COMPLICATIONS:  None.    HEMODYNAMICS:  Aortic pressure was 140/94; LV pressure was 138/16; LVEDP 26.  There was no gradient between the left ventricle and aorta.    ANGIOGRAPHIC DATA:   The left main coronary artery is widely patent.  The left anterior descending artery is a large vessel proximally. In the mid vessel, there is a 95% stenosis. There is a segment of more normal vessel followed by an area of moderate stenosis at the origin of the second diagonal which is medium size. The distal LAD appears to have only mild atherosclerosis.  The left circumflex artery is  is a large vessel. There is a large atrial branch which has a mild proximal stenosis. The first significant obtuse marginal is large and widely patent. The remainder of the circumflex is medium-sized and patent.  There appears to be a moderate stenosis before the terminal OM.  The  right coronary artery is  large dominant vessel. It appears to originate from the left cusp. It arises from very close to the left main.  It was best engaged with an AR-2 catheter. There is mild proximal disease. The posterior descending artery is medium-sized and patent. There is only mild disease. The posterior lateral artery is medium-sized and patent  LEFT VENTRICULOGRAM:  Left ventricular angiogram was not done.  LVEDP was 26 mmHg.  IMPRESSIONS:  1. Widely patent  left main coronary artery. 2. Severe disease in the mid  left anterior descending artery with moderate disease further distal. The 95% stenosis was treated with a 2.5 x 24 Synergy drug-eluting stent, postdilated with a 3.0 noncompliant balloon. 3. Mild to moderate disease in the  left circumflex artery and its branches. 4.  Mild to moderate disease in the right coronary artery.  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 5. Left ventricular systolic function not assessed . Elevated LVEDP 26 mmHg.    RECOMMENDATION:  Overall difficult cardiac cath from the right radial approach due to difficulty in locating the left main and RCA. He has a short ascending aorta.  Successful PCI of the culprit vessel LAD. Will use Effient since compliance may be an issue. We stress risk factor modification including avoiding illegal drugs as well as tobacco. He'll need weight loss. He will need an echocardiogram to assess left ventricular function.  He may need Lasix in AM.  Shorter course of  post cath fluid due to elevated LVEDP.  Cardiology follow-up with Dr. Elease HashimotoNahser or at [place] office, depending on his convenience.

## 2014-12-23 ENCOUNTER — Other Ambulatory Visit: Payer: Self-pay | Admitting: Cardiology

## 2014-12-23 DIAGNOSIS — E876 Hypokalemia: Secondary | ICD-10-CM

## 2014-12-23 DIAGNOSIS — I249 Acute ischemic heart disease, unspecified: Secondary | ICD-10-CM

## 2014-12-23 DIAGNOSIS — I10 Essential (primary) hypertension: Secondary | ICD-10-CM | POA: Diagnosis present

## 2014-12-23 DIAGNOSIS — Z9861 Coronary angioplasty status: Secondary | ICD-10-CM

## 2014-12-23 DIAGNOSIS — Z720 Tobacco use: Secondary | ICD-10-CM

## 2014-12-23 DIAGNOSIS — I213 ST elevation (STEMI) myocardial infarction of unspecified site: Secondary | ICD-10-CM

## 2014-12-23 DIAGNOSIS — I4729 Other ventricular tachycardia: Secondary | ICD-10-CM

## 2014-12-23 DIAGNOSIS — I2510 Atherosclerotic heart disease of native coronary artery without angina pectoris: Secondary | ICD-10-CM

## 2014-12-23 DIAGNOSIS — I472 Ventricular tachycardia: Secondary | ICD-10-CM

## 2014-12-23 DIAGNOSIS — J441 Chronic obstructive pulmonary disease with (acute) exacerbation: Secondary | ICD-10-CM | POA: Diagnosis present

## 2014-12-23 DIAGNOSIS — F17200 Nicotine dependence, unspecified, uncomplicated: Secondary | ICD-10-CM | POA: Diagnosis present

## 2014-12-23 DIAGNOSIS — I214 Non-ST elevation (NSTEMI) myocardial infarction: Principal | ICD-10-CM

## 2014-12-23 LAB — BASIC METABOLIC PANEL
Anion gap: 7 (ref 5–15)
BUN: 9 mg/dL (ref 6–23)
CHLORIDE: 103 mmol/L (ref 96–112)
CO2: 29 mmol/L (ref 19–32)
CREATININE: 1.16 mg/dL (ref 0.50–1.35)
Calcium: 8.9 mg/dL (ref 8.4–10.5)
GFR calc Af Amer: 84 mL/min — ABNORMAL LOW (ref >90)
GFR calc non Af Amer: 73 mL/min — ABNORMAL LOW (ref >90)
GLUCOSE: 138 mg/dL — AB (ref 70–99)
Potassium: 2.8 mmol/L — ABNORMAL LOW (ref 3.5–5.1)
Sodium: 139 mmol/L (ref 135–145)

## 2014-12-23 LAB — CBC
HCT: 44.2 % (ref 39.0–52.0)
Hemoglobin: 14.9 g/dL (ref 13.0–17.0)
MCH: 30.3 pg (ref 26.0–34.0)
MCHC: 33.7 g/dL (ref 30.0–36.0)
MCV: 89.8 fL (ref 78.0–100.0)
Platelets: 234 10*3/uL (ref 150–400)
RBC: 4.92 MIL/uL (ref 4.22–5.81)
RDW: 14.4 % (ref 11.5–15.5)
WBC: 8 10*3/uL (ref 4.0–10.5)

## 2014-12-23 LAB — LIPID PANEL
Cholesterol: 114 mg/dL (ref 0–200)
HDL: 31 mg/dL — ABNORMAL LOW (ref >39)
LDL Cholesterol: 65 mg/dL (ref 0–99)
Total CHOL/HDL Ratio: 3.7 RATIO
Triglycerides: 91 mg/dL (ref <150)
VLDL: 18 mg/dL (ref 0–40)

## 2014-12-23 MED ORDER — POTASSIUM CHLORIDE CRYS ER 20 MEQ PO TBCR
40.0000 meq | EXTENDED_RELEASE_TABLET | Freq: Once | ORAL | Status: AC
  Administered 2014-12-23: 11:00:00 40 meq via ORAL
  Filled 2014-12-23: qty 2

## 2014-12-23 MED ORDER — FUROSEMIDE 40 MG PO TABS
40.0000 mg | ORAL_TABLET | Freq: Once | ORAL | Status: AC
  Administered 2014-12-23: 40 mg via ORAL
  Filled 2014-12-23: qty 1

## 2014-12-23 MED ORDER — ACETAMINOPHEN 325 MG PO TABS: 650.0000 mg | ORAL_TABLET | ORAL | Status: DC | PRN

## 2014-12-23 MED ORDER — ASPIRIN 81 MG PO CHEW: 81.0000 mg | CHEWABLE_TABLET | Freq: Every day | ORAL | Status: DC

## 2014-12-23 MED ORDER — POTASSIUM CHLORIDE CRYS ER 20 MEQ PO TBCR
40.0000 meq | EXTENDED_RELEASE_TABLET | Freq: Once | ORAL | Status: AC
Start: 2014-12-23 — End: 2014-12-23
  Administered 2014-12-23: 14:00:00 40 meq via ORAL
  Filled 2014-12-23: qty 2

## 2014-12-23 MED ORDER — IBUPROFEN 800 MG PO TABS: 400.0000 mg | ORAL_TABLET | Freq: Three times a day (TID) | ORAL | Status: DC

## 2014-12-23 MED ORDER — LIVING WELL WITH DIABETES BOOK
Freq: Once | Status: DC
  Filled 2014-12-23: qty 1

## 2014-12-23 MED ORDER — ATORVASTATIN CALCIUM 80 MG PO TABS: 80.0000 mg | ORAL_TABLET | Freq: Every day | ORAL | Status: DC

## 2014-12-23 MED ORDER — NITROGLYCERIN 0.4 MG SL SUBL: 0.4000 mg | SUBLINGUAL_TABLET | SUBLINGUAL | Status: DC | PRN

## 2014-12-23 MED ORDER — ATORVASTATIN CALCIUM 80 MG PO TABS
80.0000 mg | ORAL_TABLET | Freq: Every day | ORAL | Status: DC
  Filled 2014-12-23: qty 1

## 2014-12-23 MED ORDER — PRASUGREL HCL 10 MG PO TABS: 10.0000 mg | ORAL_TABLET | Freq: Every day | ORAL | Status: DC

## 2014-12-23 MED ORDER — POTASSIUM CHLORIDE CRYS ER 20 MEQ PO TBCR: 20.0000 meq | EXTENDED_RELEASE_TABLET | Freq: Two times a day (BID) | ORAL | Status: DC

## 2014-12-23 MED ORDER — LISINOPRIL 40 MG PO TABS
80.0000 mg | ORAL_TABLET | Freq: Every day | ORAL | Status: DC
Start: 2014-12-23 — End: 2015-11-22

## 2014-12-23 MED ORDER — FUROSEMIDE 10 MG/ML IJ SOLN: 40.0000 mg | Freq: Once | INTRAMUSCULAR | Status: DC

## 2014-12-23 MED ORDER — FUROSEMIDE 40 MG PO TABS: 40.0000 mg | ORAL_TABLET | Freq: Every day | ORAL | Status: DC

## 2014-12-23 NOTE — Discharge Instructions (Signed)
Coronary Angiogram With Stent, Care After Refer to this sheet in the next few weeks. These instructions provide you with information on caring for yourself after your procedure. Your health care provider may also give you more specific instructions. Your treatment has been planned according to current medical practices, but problems sometimes occur. Call your health care provider if you have any problems or questions after your procedure.  WHAT TO EXPECT AFTER THE PROCEDURE  The insertion site may be tender for a few days after your procedure. HOME CARE INSTRUCTIONS   Take medicines only as directed by your health care provider. Blood thinners may be prescribed after your procedure to improve blood flow through the stent.  Change any bandages (dressings) as directed by your health care provider.   Check your insertion site every day for redness, swelling, or fluid leaking from the insertion.   Do not take baths, swim, or use a hot tub until your health care provider approves. You may shower. Pat the insertion area dry. Do not rub the insertion area with a washcloth or towel.   Eat a heart-healthy diet. This should include plenty of fresh fruits and vegetables. Meat should be lean cuts. Avoid the following types of food:   Food that is high in salt.   Canned or highly processed food.   Food that is high in saturated fat or sugar.   Fried food.   Make any other lifestyle changes recommended by your health care provider. This may include:   Not using any tobacco products including cigarettes, chewing tobacco, or electronic cigarettes.  Managing your weight.   Getting regular exercise.   Managing your blood pressure.   Limiting your alcohol intake.   Managing other health problems, such as diabetes.   If you need an MRI after your heart stent was placed, be sure to tell the health care provider who orders the MRI that you have a heart stent.   Keep all follow-up  visits as directed by your health care provider.  SEEK IMMEDIATE MEDICAL CARE IF:   You develop chest pain, shortness of breath, feel faint, or pass out.  You have bleeding, swelling larger than a walnut, or drainage from the catheter insertion site.  You develop pain, discoloration, coldness, or severe bruising in the leg or arm that held the catheter.  You develop bleeding from any other place such as from the bowels. There may be bright red blood in the urine or stools, or it may appear as black, tarry stools.  You have a fever or chills. MAKE SURE YOU:  Understand these instructions.  Will watch your condition.  Will get help right away if you are not doing well or get worse. Document Released: 03/29/2005 Document Revised: 01/24/2014 Document Reviewed: 02/10/2013 [facility] Patient Information 2015 [employer] [place]. This information is not intended to replace advice given to you by your health care provider. Make sure you discuss any questions you have with your health care provider.   Have BMP drawn next Thursday

## 2014-12-23 NOTE — Discharge Summary (Signed)
Patient ID: Chris Powell,  MRN: [id number], DOB/AGE: 49/04/1966 49 y.o.  Admit date: 12/21/2014 Discharge date: 12/23/2014  Primary Care Provider: No PCP Per Patient Primary Cardiologist: Dr Elease HashimotoNahser  Discharge Diagnoses Principal Problem:   Acute coronary syndrome Active Problems:   CAD - S/P LAD DES 3/11/22/14   Morbid obesity-BMI 51   COPD (chronic obstructive pulmonary disease)   HTN (hypertension)   Non-sustained ventricular tachycardia   Hypokalemia   Smoking    Procedures: Coronary angiogram and PCI 12/22/14   Hospital Course:  49 y.o. morbidly obese, AA male with a PMHx of HTN, COPD, smoker, admitted 12/21/14 with ACS. Troponin peak 0.07. Cath done 12/22/14 revealed LAD disease treated with DES.  Echo done showed moderate LVH with an EF of 55-65%. Post cath his K+ was low and this was treated. His medications were adjusted and Dr Eldridge DaceVaranasi feels he can be discharged 12/23/14. He'll need a BMP in a week. At some point we'll need to consider an OP sleep study.   Discharge Vitals:  Blood pressure 179/77, pulse 57, temperature 98.4 F (36.9 C), temperature source Oral, resp. rate 18, height 5\' 7"  (1.702 m), weight 346 lb 9 oz (157.2 kg), SpO2 99 %.    Labs: Results for orders placed or performed during the hospital encounter of 12/21/14 (from the past 24 hour(s))  Platelet count     Status: None   Collection Time: 12/22/14  5:21 PM  Result Value Ref Range   Platelets 284 150 - 400 K/uL  Basic metabolic panel     Status: Abnormal   Collection Time: 12/23/14  3:18 AM  Result Value Ref Range   Sodium 139 135 - 145 mmol/L   Potassium 2.8 (L) 3.5 - 5.1 mmol/L   Chloride 103 96 - 112 mmol/L   CO2 29 19 - 32 mmol/L   Glucose, Bld 138 (H) 70 - 99 mg/dL   BUN 9 6 - 23 mg/dL   Creatinine, Ser 1.911.16 0.50 - 1.35 mg/dL   Calcium 8.9 8.4 - 47.810.5 mg/dL   GFR calc non Af Amer 73 (L) >90 mL/min   GFR calc Af Amer 84 (L) >90 mL/min   Anion gap 7 5 - 15  CBC     Status: None   Collection Time: 12/23/14  3:18 AM  Result Value Ref Range   WBC 8.0 4.0 - 10.5 K/uL   RBC 4.92 4.22 - 5.81 MIL/uL   Hemoglobin 14.9 13.0 - 17.0 g/dL   HCT 29.544.2 62.139.0 - 30.852.0 %   MCV 89.8 78.0 - 100.0 fL   MCH 30.3 26.0 - 34.0 pg   MCHC 33.7 30.0 - 36.0 g/dL   RDW 65.714.4 84.611.5 - 96.215.5 %   Platelets 234 150 - 400 K/uL  Lipid panel     Status: Abnormal   Collection Time: 12/23/14  7:30 AM  Result Value Ref Range   Cholesterol 114 0 - 200 mg/dL   Triglycerides 91 <952<150 mg/dL   HDL 31 (L) >84>39 mg/dL   Total CHOL/HDL Ratio 3.7 RATIO   VLDL 18 0 - 40 mg/dL   LDL Cholesterol 65 0 - 99 mg/dL    Disposition:  Follow-up Information    Follow up with Nahser, Deloris PingPhilip J, MD.   Specialty:  Cardiology   Why:  office will call you   Contact information:   [address] [place] [address] [phone]       Discharge Medications:  Medication List    STOP taking these medications        amoxicillin 500 MG capsule  Commonly known as:  AMOXIL     hydrochlorothiazide 25 MG tablet  Commonly known as:  HYDRODIURIL      TAKE these medications        acetaminophen 325 MG tablet  Commonly known as:  TYLENOL  Take 2 tablets (650 mg total) by mouth every 4 (four) hours as needed for headache or mild pain.     aspirin 81 MG chewable tablet  Chew 1 tablet (81 mg total) by mouth daily.     atorvastatin 80 MG tablet  Commonly known as:  LIPITOR  Take 1 tablet (80 mg total) by mouth daily at 6 PM.     cloNIDine 0.3 MG tablet  Commonly known as:  CATAPRES  Take 0.3 mg by mouth 2 (two) times daily.     furosemide 40 MG tablet  Commonly known as:  LASIX  Take 1 tablet (40 mg total) by mouth daily.  Start taking on:  12/24/2014     hydrALAZINE 25 MG tablet  Commonly known as:  APRESOLINE  Take 20 mg by mouth 2 (two) times daily.     ibuprofen 800 MG tablet  Commonly known as:  ADVIL,MOTRIN  Take 0.5 tablets (400 mg total) by mouth 3 (three) times daily.     lisinopril  40 MG tablet  Commonly known as:  PRINIVIL,ZESTRIL  Take 2 tablets (80 mg total) by mouth daily.     metoprolol tartrate 25 MG tablet  Commonly known as:  LOPRESSOR  Take 25 mg by mouth 2 (two) times daily.     nitroGLYCERIN 0.4 MG SL tablet  Commonly known as:  NITROSTAT  Place 1 tablet (0.4 mg total) under the tongue every 5 (five) minutes as needed for chest pain.     potassium chloride SA 20 MEQ tablet  Commonly known as:  K-DUR,KLOR-CON  Take 1 tablet (20 mEq total) by mouth 2 (two) times daily.  Start taking on:  12/24/2014     prasugrel 10 MG Tabs tablet  Commonly known as:  EFFIENT  Take 1 tablet (10 mg total) by mouth daily.     traMADol 50 MG tablet  Commonly known as:  ULTRAM  1 or 2 po q6h prn pain         Duration of Discharge Encounter: Greater than 30 minutes including physician time.  Signed, Corine Shelter PA-C 12/23/2014 12:46 PM    I have examined the patient and reviewed assessment and plan and discussed with patient.  Agree with above as stated.  Cr. Stable post cath. No CP.  Replace potassium. He'll need aggressive secondary prevention. We spoke about the importance of dual antiplatelet therapy. He does not appear volume overloaded. 2+ right radial pulse.  Switch diuretic to Lasix.   Saphyre Cillo S.

## 2014-12-23 NOTE — Progress Notes (Signed)
CARDIAC REHAB PHASE I   PRE:  Rate/Rhythm: 65 SR  BP:  Supine: 171/85  Sitting:   Standing:    SaO2: 98 RA  MODE:  Ambulation: 1000 ft   POST:  Rate/Rhythm: 86 SR  BP:  Supine:   Sitting: 203/108 recheck 187/107  Standing:    SaO2: 98 RA 0930-1043 Pt tolerated ambulation well without c/o of cp or SOB. BP elevated before and after walk. Completed MI and stent education with pt. He voices understanding. Pt agrees to Outpt. CRP in [place], will send referral.Discussed smoking cessation with pt. I gave him cessation information. Pt has been nicotine patches for the last 3 months. He states that he was down to 5 cigarettes per day. He took off the patch and would wait a hour before he smoked. Pt admits that he knows he needs to quit. I strongly encouraged cessation.  Beatrix FettersHughes, Rene Gonsoulin G, RN 12/23/2014 10:51 AM    Melina CopaLisa Cru Kritikos RN 12/23/2014 10:44 AM

## 2014-12-23 NOTE — Progress Notes (Signed)
  Echocardiogram 2D Echocardiogram has been performed.  Chris Powell 12/23/2014, 9:19 AM

## 2014-12-23 NOTE — Progress Notes (Addendum)
    Subjective:  Up in chair, no complaints this am  Objective:  Vital Signs in the last 24 hours: Temp:  [98.2 F (36.8 C)-98.5 F (36.9 C)] 98.5 F (36.9 C) (04/01 91470637) Pulse Rate:  [55-72] 61 (04/01 0637) Resp:  [15-25] 20 (04/01 0637) BP: (149-187)/(73-99) 156/83 mmHg (04/01 0637) SpO2:  [96 %-100 %] 99 % (04/01 0637) Weight:  [346 lb 9 oz (157.2 kg)] 346 lb 9 oz (157.2 kg) (04/01 0034)  Intake/Output from previous day:  Intake/Output Summary (Last 24 hours) at 12/23/14 0652 Last data filed at 12/23/14 0035  Gross per 24 hour  Intake 2374.6 ml  Output   1500 ml  Net  874.6 ml    Physical Exam: General appearance: alert, cooperative, no distress and morbidly obese Lungs: decreased breath sounds, scattered wheezing Heart: regular rate and rhythm Extremities: Rt radial site without hematoma   Rate: 60  Rhythm: normal sinus rhythm and 17 beat run of WCT  Lab Results:  Recent Labs  12/22/14 0028 12/22/14 1721 12/23/14 0318  WBC 9.9  --  8.0  HGB 15.5  --  14.9  PLT 265 284 234    Recent Labs  12/22/14 0028 12/23/14 0318  NA 141 139  K 3.3* 2.8*  CL 107 103  CO2 25 29  GLUCOSE 118* 138*  BUN 11 9  CREATININE 1.15 1.16    Recent Labs  12/22/14 0028 12/22/14 0328  TROPONINI 0.05* 0.07*    Recent Labs  12/22/14 0903  INR 1.03    Imaging: Imaging results have been reviewed  Cardiac Studies:  Assessment/Plan:  49 y.o. Morbidly obese, AA male with a PMHx of HTN, COPD, smoker, admitted 12/21/14 with ACS. Troponin peak 0.07. Cath done 12/22/14 revealed LAD disease treated with DES.    Principal Problem:   Acute coronary syndrome Active Problems:   CAD - S/P LAD DES 3/11/22/14   Morbid obesity-BMI 51   COPD (chronic obstructive pulmonary disease)   HTN (hypertension)   Non-sustained ventricular tachycardia   Hypokalemia   Smoking   PLAN: Pt has K+ 2.8 this am- replacement ordered. Echo pending (no LV gram done at cath). IV Lasix was  mentioned for elevated LVEDP at cath but I don't see that it was ordered. Hard to tell by exam if he if fluid overloaded. Run of NSWCT on telemetry in setting of low K+. He is on beta blocker. Will add statin and check lipids. Possibly home later today after echo reviewed. BS elevated, check Hgb A1c  Quintella BatonLuke Kilroy PA-C [facility] [phone] 12/23/2014, 6:52 AM   I have examined the patient and reviewed assessment and plan and discussed with patient.  Agree with above as stated.  Cr. Stable post cath. No CP. Walk with rehab.  Await echo results. If he does have low EF, we'll give a dose of IV Lasix. Replace potassium. He'll need aggressive secondary prevention. We spoke about the importance of dual antiplatelet therapy. He does not appear volume overloaded.  2+ right radial pulse.  Melissa Tomaselli S.

## 2014-12-24 LAB — HEMOGLOBIN A1C
Hgb A1c MFr Bld: 5.8 % — ABNORMAL HIGH (ref 4.8–5.6)
Mean Plasma Glucose: 120 mg/dL

## 2014-12-29 ENCOUNTER — Encounter (HOSPITAL_COMMUNITY)
Admission: RE | Admit: 2014-12-29 | Discharge: 2014-12-29 | Disposition: A | Discharge status: Home / Self Care | Payer: Medicaid Other | Source: Ambulatory Visit | Attending: Cardiovascular Disease | Admitting: Cardiovascular Disease

## 2014-12-29 ENCOUNTER — Encounter (HOSPITAL_COMMUNITY): Payer: Self-pay

## 2014-12-29 VITALS — BP 116/74 | HR 62 | Ht 67.0 in | Wt 346.4 lb

## 2014-12-29 DIAGNOSIS — Z955 Presence of coronary angioplasty implant and graft: Secondary | ICD-10-CM

## 2014-12-29 DIAGNOSIS — I214 Non-ST elevation (NSTEMI) myocardial infarction: Secondary | ICD-10-CM

## 2014-12-29 NOTE — Progress Notes (Signed)
Cardiac/Pulmonary Rehab Medication Review by a Pharmacist  Does the patient  feel that his/her medications are working for him/her?  yes  Has the patient been experiencing any side effects to the medications prescribed?  no  Does the patient measure his/her own blood pressure or blood glucose at home?  yes   Does the patient have any problems obtaining medications due to transportation or finances?   no  Understanding of regimen: excellent Understanding of indications: excellent Potential of compliance: excellent  Questions asked to Determine Patient Understanding of Medication Regimen:  1. What is the name of the medication?  2. What is the medication used for?  3. When should it be taken?  4. How much should be taken?  5. How will you take it?  6. What side effects should you report?  Understanding Defined as: Excellent: All questions above are correct Good: Questions 1-4 are correct Fair: Questions 1-2 are correct  Poor: 1 or none of the above questions are correct   Pharmacist comments: Pt states he is not having any problems with medication.  No side effects reported.    Valrie HartHall, Lerin Jech A 12/29/2014 3:38 PM

## 2014-12-29 NOTE — Progress Notes (Signed)
Patient referred to Cardiac Rehab by Dr. Elease HashimotoNahser due to s/p stent placement, I 21.4 and NSTEMI Z 95.5.  Dr. Elease HashimotoNahser is his cardiologist and Dr. Christell ConstantMoore is his PCP.  During orientation advised patient on arrival and appointment times what to wear, what to do before, during and after exercise.  Reviewed attendance and class policy.  Talked about inclement weather and class consultation policy. Patient is scheduled to start cardiac Rehab on 01/02/15 at 0930.  Patient was advised to come to class 15 minutes before class starts.  He was also given instructions on meeting with the dietician and attending the Family Structure classes. Pt is eager to get started.  Pt was able to complete 6 min walk test.

## 2014-12-29 NOTE — Progress Notes (Signed)
Pt has finished orientation and is scheduled to start CR on 01/02/15 at 0930. Pt has been instructed to arrive to class 15 minutes early for scheduled class. Pt has been instructed to wear comfortable clothing and shoes with rubber soles. Pt has been told to take their medications 1 hour prior to coming to class.  If the patient is not going to attend class, he has been instructed to call.

## 2015-01-02 ENCOUNTER — Encounter (HOSPITAL_COMMUNITY)
Admission: RE | Admit: 2015-01-02 | Discharge: 2015-01-02 | Disposition: A | Discharge status: Home / Self Care | Payer: Medicaid Other | Source: Ambulatory Visit | Attending: Cardiovascular Disease | Admitting: Cardiovascular Disease

## 2015-01-02 DIAGNOSIS — I214 Non-ST elevation (NSTEMI) myocardial infarction: Secondary | ICD-10-CM | POA: Diagnosis not present

## 2015-01-04 ENCOUNTER — Encounter (HOSPITAL_COMMUNITY)
Admission: RE | Admit: 2015-01-04 | Discharge: 2015-01-04 | Disposition: A | Discharge status: Home / Self Care | Payer: Medicaid Other | Source: Ambulatory Visit | Attending: Cardiovascular Disease | Admitting: Cardiovascular Disease

## 2015-01-04 DIAGNOSIS — I214 Non-ST elevation (NSTEMI) myocardial infarction: Secondary | ICD-10-CM | POA: Diagnosis not present

## 2015-01-06 ENCOUNTER — Encounter (HOSPITAL_COMMUNITY)
Admission: RE | Admit: 2015-01-06 | Discharge: 2015-01-06 | Disposition: A | Discharge status: Home / Self Care | Payer: Medicaid Other | Source: Ambulatory Visit | Attending: Cardiovascular Disease | Admitting: Cardiovascular Disease

## 2015-01-06 DIAGNOSIS — I214 Non-ST elevation (NSTEMI) myocardial infarction: Secondary | ICD-10-CM | POA: Diagnosis not present

## 2015-01-06 NOTE — Progress Notes (Signed)
Cardiac Rehabilitation Program Outcomes Report   Orientation:  12/29/14 Graduate Date:  tbd Discharge Date:  tbd # of sessions completed: 3  Cardiologist: Nahser Family MD:  Keene BreathMoore Class Time:  0930  A.  Exercise Program:  Tolerates exercise @ 3.40 METS for 15 minutes and Walk Test Results:  Pre: 2.89 mets  B.  Mental Health:  Good mental attitude  C.  Education/Instruction/Skills  Knows THR for exercise  Uses Perceived Exertion Scale and/or Dyspnea Scale  D.  Nutrition/Weight Control/Body Composition:  Adherence to prescribed nutrition program: fair    E.  Blood Lipids    Lab Results  Component Value Date   CHOL 114 12/23/2014   HDL 31* 12/23/2014   LDLCALC 65 12/23/2014   TRIG 91 12/23/2014   CHOLHDL 3.7 12/23/2014    F.  Lifestyle Changes:  Continues to smoke  G.  Symptoms noted with exercise:  Asymptomatic  Report Completed By:  Doretha Sou Garland Smouse RN   Comments:  This is patients first week progress note.

## 2015-01-09 ENCOUNTER — Encounter (HOSPITAL_COMMUNITY)
Admission: RE | Admit: 2015-01-09 | Discharge: 2015-01-09 | Disposition: A | Discharge status: Home / Self Care | Payer: Medicaid Other | Source: Ambulatory Visit | Attending: Cardiovascular Disease | Admitting: Cardiovascular Disease

## 2015-01-09 DIAGNOSIS — I214 Non-ST elevation (NSTEMI) myocardial infarction: Secondary | ICD-10-CM | POA: Diagnosis not present

## 2015-01-11 ENCOUNTER — Encounter (HOSPITAL_COMMUNITY)
Admission: RE | Admit: 2015-01-11 | Discharge: 2015-01-11 | Disposition: A | Discharge status: Home / Self Care | Payer: Medicaid Other | Source: Ambulatory Visit | Attending: Cardiovascular Disease | Admitting: Cardiovascular Disease

## 2015-01-11 DIAGNOSIS — I214 Non-ST elevation (NSTEMI) myocardial infarction: Secondary | ICD-10-CM | POA: Diagnosis not present

## 2015-01-13 ENCOUNTER — Encounter (HOSPITAL_COMMUNITY): Payer: Medicaid Other

## 2015-01-16 ENCOUNTER — Encounter (HOSPITAL_COMMUNITY): Payer: Medicaid Other

## 2015-01-16 ENCOUNTER — Ambulatory Visit (INDEPENDENT_AMBULATORY_CARE_PROVIDER_SITE_OTHER): Payer: Medicaid Other | Admitting: Physician Assistant

## 2015-01-16 ENCOUNTER — Encounter: Payer: Self-pay | Admitting: Physician Assistant

## 2015-01-16 VITALS — BP 140/98 | HR 67 | Ht 67.0 in | Wt 339.0 lb

## 2015-01-16 DIAGNOSIS — I25811 Atherosclerosis of native coronary artery of transplanted heart without angina pectoris: Secondary | ICD-10-CM

## 2015-01-16 DIAGNOSIS — I25769 Atherosclerosis of bypass graft of coronary artery of transplanted heart with unspecified angina pectoris: Secondary | ICD-10-CM

## 2015-01-16 DIAGNOSIS — E876 Hypokalemia: Secondary | ICD-10-CM | POA: Diagnosis not present

## 2015-01-16 DIAGNOSIS — I10 Essential (primary) hypertension: Secondary | ICD-10-CM | POA: Diagnosis not present

## 2015-01-16 LAB — BASIC METABOLIC PANEL
BUN: 12 mg/dL (ref 6–23)
CO2: 30 mEq/L (ref 19–32)
Calcium: 9 mg/dL (ref 8.4–10.5)
Chloride: 105 mEq/L (ref 96–112)
Creatinine, Ser: 1.05 mg/dL (ref 0.40–1.50)
GFR: 96.66 mL/min (ref >60.00)
GLUCOSE: 88 mg/dL (ref 70–99)
Potassium: 3.3 mEq/L — ABNORMAL LOW (ref 3.5–5.1)
Sodium: 139 mEq/L (ref 135–145)

## 2015-01-16 MED ORDER — HYDRALAZINE HCL 25 MG PO TABS: 25.0000 mg | ORAL_TABLET | Freq: Three times a day (TID) | ORAL | Status: DC

## 2015-01-16 NOTE — Progress Notes (Signed)
Cardiology Office Note   Date:  01/16/2015   ID:  MACLAIN COHRON, DOB Apr 25, 1966, MRN [id number]  PCP:  Rudi Heap, MD  Cardiologist:  Leodis Sias, M.D.  Chief Complaint: High blood pressure    History of Present Illness: Chris Powell is a 49 y.o. male who presents for post hospital follow-up. He was admitted to the hospital with NSTEMI 12/21/14 treated with drug-eluting stent to the LAD. Echo showed moderate LVH EF 55-65%. He had hypokalemia that was treated. He also has history of COPD, hypertension, obstructive sleep apnea on CPap.  Patient comes in today for follow-up. His blood pressure is elevated. He thinks it's related to allergies. He is doing cardiac rehabilitation at [facility] and has lost 9 pounds. He is having trouble quitting smoking. He is down to half a pack a day but it is very hard for him. He is trying to use nicotine patches. Overall he feels so much better. He denies chest pain, palpitations, dyspnea, dyspnea on exertion, dizziness or presyncope.    Past Medical History  Diagnosis Date  . Hypertension   . COPD (chronic obstructive pulmonary disease)   . Asthma   . Arthritis   . Coronary artery disease   . Sleep apnea     USES CPAP  . Shortness of breath dyspnea   . Pneumonia     HX OF PNA  . Depression   . GERD (gastroesophageal reflux disease)     Past Surgical History  Procedure Laterality Date  . Knee surgery    . Coronary stent placement  12/22/2014    LAD  . Cardiac catheterization  12/22/2014  . Left heart catheterization with coronary angiogram N/A 12/22/2014    Procedure: LEFT HEART CATHETERIZATION WITH CORONARY ANGIOGRAM;  Surgeon: Corky Crafts, MD;  Location: [facility] CATH LAB;  Service: Cardiovascular;  Laterality: N/A;     Current Outpatient Prescriptions  Medication Sig Dispense Refill  . acetaminophen (TYLENOL) 325 MG tablet Take 2 tablets (650 mg total) by mouth every 4 (four) hours as needed for headache or mild pain.    Marland Kitchen  aspirin 81 MG chewable tablet Chew 1 tablet (81 mg total) by mouth daily.    Marland Kitchen atorvastatin (LIPITOR) 80 MG tablet Take 1 tablet (80 mg total) by mouth daily at 6 PM. (Patient taking differently: Take 40 mg by mouth daily at 6 PM. ) 30 tablet 11  . cloNIDine (CATAPRES) 0.3 MG tablet Take 0.3 mg by mouth 2 (two) times daily.    . furosemide (LASIX) 40 MG tablet Take 1 tablet (40 mg total) by mouth daily. 30 tablet 11  . hydrALAZINE (APRESOLINE) 10 MG tablet Take 20 mg by mouth 2 (two) times daily.    Marland Kitchen ibuprofen (ADVIL,MOTRIN) 800 MG tablet Take 0.5 tablets (400 mg total) by mouth 3 (three) times daily. 21 tablet 0  . lisinopril (PRINIVIL,ZESTRIL) 40 MG tablet Take 2 tablets (80 mg total) by mouth daily.    . metoprolol tartrate (LOPRESSOR) 25 MG tablet Take 25 mg by mouth 2 (two) times daily.    . nitroGLYCERIN (NITROSTAT) 0.4 MG SL tablet Place 1 tablet (0.4 mg total) under the tongue every 5 (five) minutes as needed for chest pain. 25 tablet 2  . potassium chloride SA (K-DUR,KLOR-CON) 20 MEQ tablet Take 1 tablet (20 mEq total) by mouth 2 (two) times daily. 30 tablet 11  . prasugrel (EFFIENT) 10 MG TABS tablet Take 1 tablet (10 mg total) by mouth daily. 30 tablet 11  .  tiotropium (SPIRIVA) 18 MCG inhalation capsule Place 18 mcg into inhaler and inhale daily as needed (Pt states he does not use daily but uses as he feels he needs it.).     No current facility-administered medications for this visit.    Allergies:   Review of patient's allergies indicates no known allergies.    Social History:  The patient  reports that he has been smoking Cigarettes.  He has a 15 pack-year smoking history. He has never used smokeless tobacco. He reports that he drinks alcohol. He reports that he uses illicit drugs (Cocaine).   Family History:  The patient's family history includes Cancer in his mother; Heart attack in his father.    ROS:  Please see the history of present illness.   Otherwise, review of  systems are positive for none.   All other systems are reviewed and negative.    PHYSICAL EXAM: VS:  BP 140/98 mmHg  Pulse 67  Ht 5\' 7"  (1.702 m)  Wt 339 lb (153.769 kg)  BMI 53.08 kg/m2 , BMI Body mass index is 53.08 kg/(m^2). GEN: Obese, well developed, in no acute distress Neck: no JVD, HJR, carotid bruits, or masses Cardiac: RRR; distant heart sounds, no murmurs,gallop, rubs, thrill or heave,  Respiratory:  clear to auscultation bilaterally, normal work of breathing GI: soft, nontender, nondistended, + BS MS: no deformity or atrophy Extremities: Right arm at cath site without hematoma or hemorrhage, good radial and brachial pulses, lower extremities without cyanosis, clubbing, edema, good distal pulses bilaterally.  Skin: warm and dry, no rash Neuro:  Strength and sensation are intact    EKG:  EKG is ordered today. The ekg ordered today demonstrates normal sinus rhythm with nonspecific T-wave abnormality, no acute change   Recent Labs: 12/23/2014: BUN 9; Creatinine 1.16; Hemoglobin 14.9; Platelets 234; Potassium 2.8*; Sodium 139    Lipid Panel    Component Value Date/Time   CHOL 114 12/23/2014 0730   TRIG 91 12/23/2014 0730   HDL 31* 12/23/2014 0730   CHOLHDL 3.7 12/23/2014 0730   VLDL 18 12/23/2014 0730   LDLCALC 65 12/23/2014 0730      Wt Readings from Last 3 Encounters:  01/16/15 339 lb (153.769 kg)  12/23/14 346 lb 9 oz (157.2 kg)  09/15/13 330 lb (149.687 kg)      Other studies Reviewed: Additional studies/ records that were reviewed today include and review of the records demonstrates:  2-D echo 12/23/14 Study Conclusions  - Left ventricle: The cavity size was normal. Wall thickness was   increased in a pattern of moderate LVH. Systolic function was   normal. The estimated ejection fraction was in the range of 55%   to 65%. - Left atrium: The atrium was moderately dilated.  Cardiac catheterization 12/22/14 IMPRESSIONS:    1. Widely patent  left main  coronary artery. 2. Severe disease in the mid  left anterior descending artery with moderate disease further distal. The 95% stenosis was treated with a 2.5 x 24 Synergy drug-eluting stent, postdilated with a 3.0 noncompliant balloon. 3. Mild to moderate disease in the  left circumflex artery and its branches. 4. Mild to moderate disease in the right coronary artery.  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 5. Left ventricular systolic function not assessed . Elevated LVEDP 26 mmHg.   RECOMMENDATION:  Overall difficult cardiac cath from the right radial approach due to difficulty in locating the left main and RCA. He has  a short ascending aorta.  Successful PCI of the culprit vessel LAD. Will use Effient since compliance may be an issue. We stress risk factor modification including avoiding illegal drugs as well as tobacco. He'll need weight loss. He will need an echocardiogram to assess left ventricular function.  He may need Lasix in AM.  Shorter course of post cath fluid due to elevated LVEDP.    ASSESSMENT AND PLAN: CAD - S/P LAD DES 3/11/22/14 Patient is doing well after recent MI. He denies any chest pain or cardiac symptoms. He is in cardiac rehabilitation. He has lost 9 pounds. He needs to continue her weight loss program. He also needs to quit smoking. Check fasting lipid panel and LFTs in 6 weeks. Follow-up with Dr.Nahser 2 months.   HTN (hypertension) Blood pressure elevated. Increase hydralazine to 25 mg 3 times a day.   Smoking Smoking cessation discussed. This is very difficult for the patient. He is using nicotine patches and is down to half a pack per day.   Hypokalemia Recheck today   Morbid obesity-BMI 51 Patient is doing cardiac rehabilitation and trying to lose weight.      Elson Clan, PA-C  01/16/2015 10:00 AM    [facility] [address] [place], [place], [place]  [address] Phone: [phone]; Fax: [phone]

## 2015-01-16 NOTE — Assessment & Plan Note (Signed)
Patient is doing well after recent MI. He denies any chest pain or cardiac symptoms. He is in cardiac rehabilitation. He has lost 9 pounds. He needs to continue her weight loss program. He also needs to quit smoking. Check fasting lipid panel and LFTs in 6 weeks. Follow-up with Dr.Nahser 2 months.

## 2015-01-16 NOTE — Assessment & Plan Note (Signed)
Patient is doing cardiac rehabilitation and trying to lose weight.

## 2015-01-16 NOTE — Assessment & Plan Note (Signed)
Smoking cessation discussed. This is very difficult for the patient. He is using nicotine patches and is down to half a pack per day.

## 2015-01-16 NOTE — Assessment & Plan Note (Signed)
Recheck today. 

## 2015-01-16 NOTE — Patient Instructions (Signed)
Medication Instructions:  START TAKING HYDRALAZINE 25 MG THREE TIMES A DAY   Labwork: BMET TODAY IN  6 WEEKS FASTING LFT AND LIPIDS   Testing/Procedures: NONE TODAY   Follow-Up: WITH DR Janece CanterburyNAUSER IN 3 MONTHS   Any Other Special Instructions Will Be Listed Below (If Applicable).  Smoking Cessation Quitting smoking is important to your health and has many advantages. However, it is not always easy to quit since nicotine is a very addictive drug. Oftentimes, people try 3 times or more before being able to quit. This document explains the best ways for you to prepare to quit smoking. Quitting takes hard work and a lot of effort, but you can do it. ADVANTAGES OF QUITTING SMOKING  You will live longer, feel better, and live better.  Your body will feel the impact of quitting smoking almost immediately.  Within 20 minutes, blood pressure decreases. Your pulse returns to its normal level.  After 8 hours, carbon monoxide levels in the blood return to normal. Your oxygen level increases.  After 24 hours, the chance of having a heart attack starts to decrease. Your breath, hair, and body stop smelling like smoke.  After 48 hours, damaged nerve endings begin to recover. Your sense of taste and smell improve.  After 72 hours, the body is virtually free of nicotine. Your bronchial tubes relax and breathing becomes easier.  After 2 to 12 weeks, lungs can hold more air. Exercise becomes easier and circulation improves.  The risk of having a heart attack, stroke, cancer, or lung disease is greatly reduced.  After 1 year, the risk of coronary heart disease is cut in half.  After 5 years, the risk of stroke falls to the same as a nonsmoker.  After 10 years, the risk of lung cancer is cut in half and the risk of other cancers decreases significantly.  After 15 years, the risk of coronary heart disease drops, usually to the level of a nonsmoker.  If you are pregnant, quitting smoking will improve  your chances of having a healthy baby.  The people you live with, especially any children, will be healthier.  You will have extra money to spend on things other than cigarettes. QUESTIONS TO THINK ABOUT BEFORE ATTEMPTING TO QUIT You may want to talk about your answers with your health care provider.  Why do you want to quit?  If you tried to quit in the past, what helped and what did not?  What will be the most difficult situations for you after you quit? How will you plan to handle them?  Who can help you through the tough times? Your family? Friends? A health care provider?  What pleasures do you get from smoking? What ways can you still get pleasure if you quit? Here are some questions to ask your health care provider:  How can you help me to be successful at quitting?  What medicine do you think would be best for me and how should I take it?  What should I do if I need more help?  What is smoking withdrawal like? How can I get information on withdrawal? GET READY  Set a quit date.  Change your environment by getting rid of all cigarettes, ashtrays, matches, and lighters in your home, car, or work. Do not let people smoke in your home.  Review your past attempts to quit. Think about what worked and what did not. GET SUPPORT AND ENCOURAGEMENT You have a better chance of being successful if you  have help. You can get support in many ways.  Tell your family, friends, and coworkers that you are going to quit and need their support. Ask them not to smoke around you.  Get individual, group, or telephone counseling and support. Programs are available at [employer] and health centers. Call your local health department for information about programs in your area.  Spiritual beliefs and practices may help some smokers quit.  Download a "quit meter" on your computer to keep track of quit statistics, such as how long you have gone without smoking, cigarettes not smoked, and  money saved.  Get a self-help book about quitting smoking and staying off tobacco. Summit yourself from urges to smoke. Talk to someone, go for a walk, or occupy your time with a task.  Change your normal routine. Take a different route to work. Drink tea instead of coffee. Eat breakfast in a different place.  Reduce your stress. Take a hot bath, exercise, or read a book.  Plan something enjoyable to do every day. Reward yourself for not smoking.  Explore interactive web-based programs that specialize in helping you quit. GET MEDICINE AND USE IT CORRECTLY Medicines can help you stop smoking and decrease the urge to smoke. Combining medicine with the above behavioral methods and support can greatly increase your chances of successfully quitting smoking.  Nicotine replacement therapy helps deliver nicotine to your body without the negative effects and risks of smoking. Nicotine replacement therapy includes nicotine gum, lozenges, inhalers, nasal sprays, and skin patches. Some may be available over-the-counter and others require a prescription.  Antidepressant medicine helps people abstain from smoking, but how this works is unknown. This medicine is available by prescription.  Nicotinic receptor partial agonist medicine simulates the effect of nicotine in your brain. This medicine is available by prescription. Ask your health care provider for advice about which medicines to use and how to use them based on your health history. Your health care provider will tell you what side effects to look out for if you choose to be on a medicine or therapy. Carefully read the information on the package. Do not use any other product containing nicotine while using a nicotine replacement product.  RELAPSE OR DIFFICULT SITUATIONS Most relapses occur within the first 3 months after quitting. Do not be discouraged if you start smoking again. Remember, most people try several  times before finally quitting. You may have symptoms of withdrawal because your body is used to nicotine. You may crave cigarettes, be irritable, feel very hungry, cough often, get headaches, or have difficulty concentrating. The withdrawal symptoms are only temporary. They are strongest when you first quit, but they will go away within 10-14 days. To reduce the chances of relapse, try to:  Avoid drinking alcohol. Drinking lowers your chances of successfully quitting.  Reduce the amount of caffeine you consume. Once you quit smoking, the amount of caffeine in your body increases and can give you symptoms, such as a rapid heartbeat, sweating, and anxiety.  Avoid smokers because they can make you want to smoke.  Do not let weight gain distract you. Many smokers will gain weight when they quit, usually less than 10 pounds. Eat a healthy diet and stay active. You can always lose the weight gained after you quit.  Find ways to improve your mood other than smoking. FOR MORE INFORMATION  www.smokefree.gov  Document Released: 09/03/2001 Document Revised: 01/24/2014 Document Reviewed: 12/19/2011 [facility] Patient Information 2015 [employer]. This information is not intended to replace advice given to you by your health care provider. Make sure you discuss any questions you have with your health care provider.  

## 2015-01-16 NOTE — Assessment & Plan Note (Signed)
Blood pressure elevated. Increase hydralazine to 25 mg 3 times a day.

## 2015-01-18 ENCOUNTER — Encounter (HOSPITAL_COMMUNITY): Payer: Medicaid Other

## 2015-01-19 ENCOUNTER — Other Ambulatory Visit: Payer: Self-pay | Admitting: *Deleted

## 2015-01-19 ENCOUNTER — Telehealth: Payer: Self-pay | Admitting: *Deleted

## 2015-01-19 DIAGNOSIS — E876 Hypokalemia: Secondary | ICD-10-CM

## 2015-01-20 ENCOUNTER — Other Ambulatory Visit: Payer: Self-pay

## 2015-01-20 ENCOUNTER — Encounter (HOSPITAL_COMMUNITY): Payer: Self-pay

## 2015-01-20 ENCOUNTER — Emergency Department (HOSPITAL_COMMUNITY): Payer: Medicaid Other

## 2015-01-20 ENCOUNTER — Encounter (HOSPITAL_COMMUNITY): Admission: RE | Admit: 2015-01-20 | Payer: Medicaid Other | Source: Ambulatory Visit

## 2015-01-20 ENCOUNTER — Emergency Department (HOSPITAL_COMMUNITY)
Admission: EM | Admit: 2015-01-20 | Discharge: 2015-01-20 | Discharge status: Against Medical Advice | Payer: Medicaid Other | Attending: Emergency Medicine | Admitting: Emergency Medicine

## 2015-01-20 DIAGNOSIS — Z9981 Dependence on supplemental oxygen: Secondary | ICD-10-CM | POA: Diagnosis not present

## 2015-01-20 DIAGNOSIS — J441 Chronic obstructive pulmonary disease with (acute) exacerbation: Secondary | ICD-10-CM | POA: Diagnosis not present

## 2015-01-20 DIAGNOSIS — R079 Chest pain, unspecified: Secondary | ICD-10-CM | POA: Diagnosis present

## 2015-01-20 DIAGNOSIS — Z7982 Long term (current) use of aspirin: Secondary | ICD-10-CM | POA: Diagnosis not present

## 2015-01-20 DIAGNOSIS — Z9889 Other specified postprocedural states: Secondary | ICD-10-CM | POA: Insufficient documentation

## 2015-01-20 DIAGNOSIS — Z8701 Personal history of pneumonia (recurrent): Secondary | ICD-10-CM | POA: Insufficient documentation

## 2015-01-20 DIAGNOSIS — Z8719 Personal history of other diseases of the digestive system: Secondary | ICD-10-CM | POA: Insufficient documentation

## 2015-01-20 DIAGNOSIS — I10 Essential (primary) hypertension: Secondary | ICD-10-CM | POA: Insufficient documentation

## 2015-01-20 DIAGNOSIS — I2510 Atherosclerotic heart disease of native coronary artery without angina pectoris: Secondary | ICD-10-CM | POA: Diagnosis not present

## 2015-01-20 DIAGNOSIS — G4730 Sleep apnea, unspecified: Secondary | ICD-10-CM | POA: Diagnosis not present

## 2015-01-20 DIAGNOSIS — Z79899 Other long term (current) drug therapy: Secondary | ICD-10-CM | POA: Insufficient documentation

## 2015-01-20 DIAGNOSIS — F329 Major depressive disorder, single episode, unspecified: Secondary | ICD-10-CM | POA: Diagnosis not present

## 2015-01-20 LAB — CBC WITH DIFFERENTIAL/PLATELET
BASOS ABS: 0.1 10*3/uL (ref 0.0–0.1)
Basophils Relative: 1 % (ref 0–1)
EOS ABS: 0.1 10*3/uL (ref 0.0–0.7)
Eosinophils Relative: 2 % (ref 0–5)
HCT: 44 % (ref 39.0–52.0)
Hemoglobin: 14.6 g/dL (ref 13.0–17.0)
Lymphocytes Relative: 23 % (ref 12–46)
Lymphs Abs: 1.7 10*3/uL (ref 0.7–4.0)
MCH: 30.5 pg (ref 26.0–34.0)
MCHC: 33.2 g/dL (ref 30.0–36.0)
MCV: 91.9 fL (ref 78.0–100.0)
MONO ABS: 0.5 10*3/uL (ref 0.1–1.0)
Monocytes Relative: 7 % (ref 3–12)
Neutro Abs: 5.2 10*3/uL (ref 1.7–7.7)
Neutrophils Relative %: 67 % (ref 43–77)
PLATELETS: 213 10*3/uL (ref 150–400)
RBC: 4.79 MIL/uL (ref 4.22–5.81)
RDW: 14.5 % (ref 11.5–15.5)
WBC: 7.6 10*3/uL (ref 4.0–10.5)

## 2015-01-20 LAB — BASIC METABOLIC PANEL
Anion gap: 7 (ref 5–15)
BUN: 13 mg/dL (ref 6–23)
CALCIUM: 8.7 mg/dL (ref 8.4–10.5)
CHLORIDE: 108 mmol/L (ref 96–112)
CO2: 26 mmol/L (ref 19–32)
Creatinine, Ser: 1.24 mg/dL (ref 0.50–1.35)
GFR calc Af Amer: 78 mL/min — ABNORMAL LOW (ref >90)
GFR, EST NON AFRICAN AMERICAN: 67 mL/min — AB (ref >90)
GLUCOSE: 128 mg/dL — AB (ref 70–99)
Potassium: 3.5 mmol/L (ref 3.5–5.1)
Sodium: 141 mmol/L (ref 135–145)

## 2015-01-20 LAB — TROPONIN I: Troponin I: 0.08 ng/mL — ABNORMAL HIGH (ref <0.031)

## 2015-01-20 LAB — BRAIN NATRIURETIC PEPTIDE: B Natriuretic Peptide: 55 pg/mL (ref 0.0–100.0)

## 2015-01-20 MED ORDER — ASPIRIN 81 MG PO CHEW
324.0000 mg | CHEWABLE_TABLET | Freq: Once | ORAL | Status: AC
  Administered 2015-01-20: 324 mg via ORAL
  Filled 2015-01-20: qty 4

## 2015-01-20 NOTE — ED Notes (Signed)
Pt here from cardiac rehab for evaluation of left sided chest pain. Pt was walking on the treadmill when his pain began and was short of breath. Pt rated his pain 5/10 at that time. He took 1 of his NTG prior to coming over to the ED. Pain is 0/10 now

## 2015-01-20 NOTE — Progress Notes (Signed)
Consulted on patient in ER, patient left AMA before I was able to see him. We will have him added on to our clinic schedule for next week.   Dominga FerryJ [place] MD

## 2015-01-20 NOTE — ED Provider Notes (Signed)
CSN: [id number]     Arrival date & time 01/20/15  1001 History   First MD Initiated Contact with Patient 01/20/15 1026     Chief Complaint  Patient presents with  . Chest Pain     (Consider location/radiation/quality/duration/timing/severity/associated sxs/prior Treatment) HPI Comments: Patient presents to the ER for evaluation of chest discomfort. Patient was in cardiac rehabilitation when he developed a "awkwardness" in his left chest. He felt short of breath. He took a single nitroglycerin and is 5 out of 10 pain resolved. Patient feels like he is breathing better now as well. He reports that he had blood work performed as an outpatient was called yesterday, told his potassium was still low. He took extra potassium yesterday. Patient is without complaints at arrival to the ER.  Patient is a 49 y.o. male presenting with chest pain.  Chest Pain Associated symptoms: shortness of breath     Past Medical History  Diagnosis Date  . Hypertension   . COPD (chronic obstructive pulmonary disease)   . Asthma   . Arthritis   . Coronary artery disease   . Sleep apnea     USES CPAP  . Shortness of breath dyspnea   . Pneumonia     HX OF PNA  . Depression   . GERD (gastroesophageal reflux disease)    Past Surgical History  Procedure Laterality Date  . Knee surgery    . Coronary stent placement  12/22/2014    LAD  . Cardiac catheterization  12/22/2014  . Left heart catheterization with coronary angiogram N/A 12/22/2014    Procedure: LEFT HEART CATHETERIZATION WITH CORONARY ANGIOGRAM;  Surgeon: Corky Crafts, MD;  Location: [facility] CATH LAB;  Service: Cardiovascular;  Laterality: N/A;   Family History  Problem Relation Age of Onset  . Heart attack Father   . Cancer Mother    History  Substance Use Topics  . Smoking status: Current Every Day Smoker -- 0.50 packs/day for 30 years    Types: Cigarettes  . Smokeless tobacco: Never Used     Comment: " I have  tried vapor cigarettes"  .  Alcohol Use: Yes     Comment: occ.    Review of Systems  Respiratory: Positive for shortness of breath.   Cardiovascular: Positive for chest pain.  All other systems reviewed and are negative.     Allergies  Review of patient's allergies indicates no known allergies.  Home Medications   Prior to Admission medications   Medication Sig Start Date End Date Taking? Authorizing Provider  acetaminophen (TYLENOL) 325 MG tablet Take 2 tablets (650 mg total) by mouth every 4 (four) hours as needed for headache or mild pain. 12/23/14  Yes Abelino Derrick, PA-C  aspirin 81 MG chewable tablet Chew 1 tablet (81 mg total) by mouth daily. 12/23/14  Yes Luke K Kilroy, PA-C  atorvastatin (LIPITOR) 80 MG tablet Take 1 tablet (80 mg total) by mouth daily at 6 PM. Patient taking differently: Take 40 mg by mouth daily at 6 PM.  12/23/14  Yes Abelino Derrick, PA-C  cloNIDine (CATAPRES) 0.3 MG tablet Take 0.3 mg by mouth 2 (two) times daily.   Yes Historical Provider, MD  furosemide (LASIX) 40 MG tablet Take 1 tablet (40 mg total) by mouth daily. 12/24/14  Yes Luke K Kilroy, PA-C  hydrALAZINE (APRESOLINE) 25 MG tablet Take 1 tablet (25 mg total) by mouth 3 (three) times daily. 01/16/15  Yes Dyann Kief, PA-C  ibuprofen (ADVIL,MOTRIN) 800 MG  tablet Take 0.5 tablets (400 mg total) by mouth 3 (three) times daily. 12/23/14  Yes Luke K Kilroy, PA-C  lisinopril (PRINIVIL,ZESTRIL) 40 MG tablet Take 2 tablets (80 mg total) by mouth daily. 12/23/14  Yes Luke K Kilroy, PA-C  metoprolol tartrate (LOPRESSOR) 25 MG tablet Take 25 mg by mouth 2 (two) times daily.   Yes Historical Provider, MD  nitroGLYCERIN (NITROSTAT) 0.4 MG SL tablet Place 1 tablet (0.4 mg total) under the tongue every 5 (five) minutes as needed for chest pain. 12/23/14  Yes Luke K Kilroy, PA-C  potassium chloride SA (K-DUR,KLOR-CON) 20 MEQ tablet Take 1 tablet (20 mEq total) by mouth 2 (two) times daily. 12/24/14  Yes Abelino Derrick, PA-C  prasugrel (EFFIENT) 10 MG TABS  tablet Take 1 tablet (10 mg total) by mouth daily. 12/23/14  Yes Luke K Kilroy, PA-C  tiotropium (SPIRIVA) 18 MCG inhalation capsule Place 18 mcg into inhaler and inhale daily as needed (Pt states he does not use daily but uses as he feels he needs it.).   Yes Historical Provider, MD   BP 124/62 mmHg  Pulse 65  Temp(Src) 98.6 F (37 C) (Oral)  Resp 15  SpO2 96% Physical Exam  Constitutional: He is oriented to person, place, and time. He appears well-developed and well-nourished. No distress.  HENT:  Head: Normocephalic and atraumatic.  Right Ear: Hearing normal.  Left Ear: Hearing normal.  Nose: Nose normal.  Mouth/Throat: Oropharynx is clear and moist and mucous membranes are normal.  Eyes: Conjunctivae and EOM are normal. Pupils are equal, round, and reactive to light.  Neck: Normal range of motion. Neck supple.  Cardiovascular: Regular rhythm, S1 normal and S2 normal.  Exam reveals no gallop and no friction rub.   No murmur heard. Pulmonary/Chest: Effort normal and breath sounds normal. No respiratory distress. He exhibits no tenderness.  Abdominal: Soft. Normal appearance and bowel sounds are normal. There is no hepatosplenomegaly. There is no tenderness. There is no rebound, no guarding, no tenderness at McBurney's point and negative Murphy's sign. No hernia.  Musculoskeletal: Normal range of motion.  Neurological: He is alert and oriented to person, place, and time. He has normal strength. No cranial nerve deficit or sensory deficit. Coordination normal. GCS eye subscore is 4. GCS verbal subscore is 5. GCS motor subscore is 6.  Skin: Skin is warm, dry and intact. No rash noted. No cyanosis.  Psychiatric: He has a normal mood and affect. His speech is normal and behavior is normal. Thought content normal.  Nursing note and vitals reviewed.   ED Course  Procedures (including critical care time) Labs Review Labs Reviewed  CBC WITH DIFFERENTIAL/PLATELET  BASIC METABOLIC PANEL   TROPONIN I  BRAIN NATRIURETIC PEPTIDE    Imaging Review No results found.   EKG Interpretation   Date/Time:  Friday January 20 2015 10:05:01 EDT Ventricular Rate:  64 PR Interval:  126 QRS Duration: 93 QT Interval:  439 QTC Calculation: 453 R Axis:   68 Text Interpretation:  Sinus rhythm No significant change since last  tracing Confirmed by POLLINA  MD, CHRISTOPHER [phone]) on 01/20/2015  10:51:08 AM      MDM   Final diagnoses:  Chest pain   Patient presents to the ER for evaluation of chest pain. Patient was on the treadmill at cardiac rehabilitation when he developed left-sided chest pain. This resolved after nitroglycerin. At arrival to the ER, he is pain-free. He has not had any recurrence of his symptoms. Patient's EKG does  not show any significant change from previous. Troponin is slightly elevated at 0.08. Reviewing his records reveals that he did have a mild elevation one month ago when he had as an STEMI. Cardiac catheterization at that time did show 95% LAD blockage which was stented. I discussed this briefly with Dr. Wyline MoodBranch, cardiology. He did feel the patient would benefit from hospitalization and consultation by cardiology. Unfortunately, however, patient has now declined admission. Patient was informed that we are concerned that his pain is secondary to his heart and he could have another heart attack. He understands he can become permanently disabled or die, he left AGAINST MEDICAL ADVICE.    Gilda Creasehristopher J Pollina, MD 01/20/15 [phone]

## 2015-01-20 NOTE — ED Notes (Signed)
Pain free at present, states he is an hour late for work and ready to go.

## 2015-01-20 NOTE — ED Notes (Signed)
Pt states that he is not having pain currently and feels that he got the pain due to his potassium being low.

## 2015-01-23 ENCOUNTER — Encounter (HOSPITAL_COMMUNITY)
Admission: RE | Admit: 2015-01-23 | Discharge: 2015-01-23 | Disposition: A | Discharge status: Home / Self Care | Payer: Medicaid Other | Source: Ambulatory Visit | Attending: Cardiovascular Disease | Admitting: Cardiovascular Disease

## 2015-01-23 DIAGNOSIS — Z955 Presence of coronary angioplasty implant and graft: Secondary | ICD-10-CM | POA: Insufficient documentation

## 2015-01-23 DIAGNOSIS — I214 Non-ST elevation (NSTEMI) myocardial infarction: Secondary | ICD-10-CM | POA: Diagnosis present

## 2015-01-24 ENCOUNTER — Encounter (HOSPITAL_COMMUNITY): Payer: Self-pay

## 2015-01-24 ENCOUNTER — Other Ambulatory Visit (HOSPITAL_COMMUNITY): Payer: Self-pay

## 2015-01-24 ENCOUNTER — Inpatient Hospital Stay (HOSPITAL_COMMUNITY)
Admission: EM | Admit: 2015-01-24 | Discharge: 2015-01-25 | DRG: 281 | Disposition: A | Discharge status: Home / Self Care | Payer: Medicaid Other | Attending: Cardiovascular Disease | Admitting: Cardiovascular Disease

## 2015-01-24 ENCOUNTER — Encounter (HOSPITAL_COMMUNITY)
Admission: EM | Disposition: A | Discharge status: Home / Self Care | Payer: Self-pay | Source: Home / Self Care | Attending: Cardiovascular Disease

## 2015-01-24 DIAGNOSIS — Z9861 Coronary angioplasty status: Secondary | ICD-10-CM | POA: Diagnosis not present

## 2015-01-24 DIAGNOSIS — I10 Essential (primary) hypertension: Secondary | ICD-10-CM | POA: Diagnosis not present

## 2015-01-24 DIAGNOSIS — F17210 Nicotine dependence, cigarettes, uncomplicated: Secondary | ICD-10-CM | POA: Diagnosis present

## 2015-01-24 DIAGNOSIS — Z79899 Other long term (current) drug therapy: Secondary | ICD-10-CM | POA: Diagnosis not present

## 2015-01-24 DIAGNOSIS — J45909 Unspecified asthma, uncomplicated: Secondary | ICD-10-CM | POA: Diagnosis present

## 2015-01-24 DIAGNOSIS — J449 Chronic obstructive pulmonary disease, unspecified: Secondary | ICD-10-CM | POA: Diagnosis present

## 2015-01-24 DIAGNOSIS — M1990 Unspecified osteoarthritis, unspecified site: Secondary | ICD-10-CM | POA: Diagnosis present

## 2015-01-24 DIAGNOSIS — I214 Non-ST elevation (NSTEMI) myocardial infarction: Secondary | ICD-10-CM

## 2015-01-24 DIAGNOSIS — Z955 Presence of coronary angioplasty implant and graft: Secondary | ICD-10-CM

## 2015-01-24 DIAGNOSIS — F1410 Cocaine abuse, uncomplicated: Secondary | ICD-10-CM | POA: Diagnosis present

## 2015-01-24 DIAGNOSIS — K219 Gastro-esophageal reflux disease without esophagitis: Secondary | ICD-10-CM | POA: Diagnosis present

## 2015-01-24 DIAGNOSIS — Z7982 Long term (current) use of aspirin: Secondary | ICD-10-CM

## 2015-01-24 DIAGNOSIS — I2510 Atherosclerotic heart disease of native coronary artery without angina pectoris: Secondary | ICD-10-CM | POA: Diagnosis present

## 2015-01-24 DIAGNOSIS — F329 Major depressive disorder, single episode, unspecified: Secondary | ICD-10-CM | POA: Diagnosis present

## 2015-01-24 DIAGNOSIS — R0789 Other chest pain: Secondary | ICD-10-CM | POA: Diagnosis not present

## 2015-01-24 DIAGNOSIS — I252 Old myocardial infarction: Secondary | ICD-10-CM | POA: Diagnosis present

## 2015-01-24 DIAGNOSIS — Z6841 Body Mass Index (BMI) 40.0 and over, adult: Secondary | ICD-10-CM | POA: Diagnosis not present

## 2015-01-24 DIAGNOSIS — G4730 Sleep apnea, unspecified: Secondary | ICD-10-CM | POA: Diagnosis present

## 2015-01-24 DIAGNOSIS — J441 Chronic obstructive pulmonary disease with (acute) exacerbation: Secondary | ICD-10-CM | POA: Diagnosis present

## 2015-01-24 DIAGNOSIS — R079 Chest pain, unspecified: Secondary | ICD-10-CM | POA: Diagnosis not present

## 2015-01-24 HISTORY — DX: Acute myocardial infarction, unspecified: I21.9

## 2015-01-24 LAB — I-STAT CHEM 8, ED
BUN: 12 mg/dL (ref 6–20)
Calcium, Ion: 1.1 mmol/L — ABNORMAL LOW (ref 1.12–1.23)
Chloride: 103 mmol/L (ref 101–111)
Creatinine, Ser: 1.1 mg/dL (ref 0.61–1.24)
Glucose, Bld: 99 mg/dL (ref 70–99)
HCT: 48 % (ref 39.0–52.0)
Hemoglobin: 16.3 g/dL (ref 13.0–17.0)
Potassium: 3.2 mmol/L — ABNORMAL LOW (ref 3.5–5.1)
Sodium: 143 mmol/L (ref 135–145)
TCO2: 24 mmol/L (ref 0–100)

## 2015-01-24 LAB — HEPARIN LEVEL (UNFRACTIONATED)
HEPARIN UNFRACTIONATED: 0.33 [IU]/mL (ref 0.30–0.70)
Heparin Unfractionated: 0.2 IU/mL — ABNORMAL LOW (ref 0.30–0.70)

## 2015-01-24 LAB — TROPONIN I
TROPONIN I: 0.8 ng/mL — AB (ref <0.031)
Troponin I: 0.7 ng/mL (ref <0.031)
Troponin I: 1.12 ng/mL (ref <0.031)
Troponin I: 0.77 ng/mL (ref <0.031)

## 2015-01-24 LAB — I-STAT TROPONIN, ED: Troponin i, poc: 0.46 ng/mL (ref 0.00–0.08)

## 2015-01-24 SURGERY — LEFT HEART CATH AND CORONARY ANGIOGRAPHY
Anesthesia: LOCAL

## 2015-01-24 MED ORDER — PRASUGREL HCL 10 MG PO TABS
10.0000 mg | ORAL_TABLET | Freq: Every day | ORAL | Status: DC
  Administered 2015-01-24 – 2015-01-25 (×2): 10 mg via ORAL
  Filled 2015-01-24 (×4): qty 1

## 2015-01-24 MED ORDER — ONDANSETRON HCL 4 MG/2ML IJ SOLN: 4.0000 mg | Freq: Four times a day (QID) | INTRAMUSCULAR | Status: DC | PRN

## 2015-01-24 MED ORDER — LISINOPRIL 40 MG PO TABS
80.0000 mg | ORAL_TABLET | Freq: Every day | ORAL | Status: DC
  Administered 2015-01-24 – 2015-01-25 (×2): 80 mg via ORAL
  Filled 2015-01-24: qty 4
  Filled 2015-01-24: qty 2

## 2015-01-24 MED ORDER — POTASSIUM CHLORIDE CRYS ER 20 MEQ PO TBCR
40.0000 meq | EXTENDED_RELEASE_TABLET | Freq: Once | ORAL | Status: AC
  Administered 2015-01-24: 40 meq via ORAL
  Filled 2015-01-24: qty 2

## 2015-01-24 MED ORDER — ATORVASTATIN CALCIUM 80 MG PO TABS
80.0000 mg | ORAL_TABLET | Freq: Every day | ORAL | Status: DC
  Administered 2015-01-24: 80 mg via ORAL
  Filled 2015-01-24 (×2): qty 1

## 2015-01-24 MED ORDER — HYDRALAZINE HCL 25 MG PO TABS
25.0000 mg | ORAL_TABLET | Freq: Three times a day (TID) | ORAL | Status: DC
  Administered 2015-01-24 – 2015-01-25 (×4): 25 mg via ORAL
  Filled 2015-01-24 (×7): qty 1

## 2015-01-24 MED ORDER — HEPARIN (PORCINE) IN NACL 100-0.45 UNIT/ML-% IJ SOLN
1950.0000 [IU]/h | INTRAMUSCULAR | Status: DC
  Administered 2015-01-24: 1500 [IU]/h via INTRAVENOUS
  Administered 2015-01-24: 1700 [IU]/h via INTRAVENOUS
  Administered 2015-01-25: 1950 [IU]/h via INTRAVENOUS
  Filled 2015-01-24 (×4): qty 250

## 2015-01-24 MED ORDER — HEPARIN BOLUS VIA INFUSION
4000.0000 [IU] | Freq: Once | INTRAVENOUS | Status: AC
  Administered 2015-01-24: 4000 [IU] via INTRAVENOUS
  Filled 2015-01-24: qty 4000

## 2015-01-24 MED ORDER — ACETAMINOPHEN 325 MG PO TABS
650.0000 mg | ORAL_TABLET | Freq: Once | ORAL | Status: AC
  Administered 2015-01-24: 650 mg via ORAL
  Filled 2015-01-24: qty 2

## 2015-01-24 MED ORDER — METOPROLOL TARTRATE 25 MG PO TABS
25.0000 mg | ORAL_TABLET | Freq: Two times a day (BID) | ORAL | Status: DC
  Administered 2015-01-24 – 2015-01-25 (×3): 25 mg via ORAL
  Filled 2015-01-24 (×3): qty 1

## 2015-01-24 MED ORDER — HEPARIN BOLUS VIA INFUSION
1500.0000 [IU] | Freq: Once | INTRAVENOUS | Status: AC
  Administered 2015-01-24: 1500 [IU] via INTRAVENOUS
  Filled 2015-01-24: qty 1500

## 2015-01-24 MED ORDER — TIOTROPIUM BROMIDE MONOHYDRATE 18 MCG IN CAPS: 18.0000 ug | ORAL_CAPSULE | Freq: Every day | RESPIRATORY_TRACT | Status: DC | PRN

## 2015-01-24 MED ORDER — NITROGLYCERIN 0.4 MG SL SUBL: 0.4000 mg | SUBLINGUAL_TABLET | SUBLINGUAL | Status: DC | PRN

## 2015-01-24 MED ORDER — ASPIRIN 81 MG PO CHEW
81.0000 mg | CHEWABLE_TABLET | Freq: Every day | ORAL | Status: DC
  Administered 2015-01-25: 81 mg via ORAL
  Filled 2015-01-24 (×2): qty 1

## 2015-01-24 MED ORDER — ACETAMINOPHEN 325 MG PO TABS
650.0000 mg | ORAL_TABLET | ORAL | Status: DC | PRN
  Administered 2015-01-24: 650 mg via ORAL
  Filled 2015-01-24: qty 2

## 2015-01-24 MED ORDER — MORPHINE SULFATE 4 MG/ML IJ SOLN
4.0000 mg | Freq: Once | INTRAMUSCULAR | Status: AC
  Administered 2015-01-24: 4 mg via INTRAVENOUS
  Filled 2015-01-24: qty 1

## 2015-01-24 NOTE — Progress Notes (Signed)
No further angina since topical nitrates were applied. Cocaine use last Friday with a couple of episodes of angina at rest since then. Troponin increased to 1.1 Normal ECG. Suspect cocaine related vasospasm. Observe on nitrates. Cath if symptoms recur on vasodilators.  Thurmon FairMihai Hilary Pundt, MD, [facility] [phone] office [phone] pager

## 2015-01-24 NOTE — Progress Notes (Signed)
ANTICOAGULATION CONSULT NOTE - Follow Up Consult  Pharmacy Consult for heparin Indication: NSTEMI  No Known Allergies  Patient Measurements: Weight: 154 kg Height: 67" Heparin Dosing Weight: 104 kg  Vital Signs: Temp: 98.3 F (36.8 C) (05/03 1205) Temp Source: Oral (05/03 1205) BP: 175/107 mmHg (05/03 1300) Pulse Rate: 73 (05/03 1300)  Labs:  Recent Labs  01/24/15 0320 01/24/15 0327 01/24/15 0730 01/24/15 1053  HGB  --  16.3  --   --   HCT  --  48.0  --   --   HEPARINUNFRC  --   --   --  0.33  CREATININE  --  1.10  --   --   TROPONINI 0.70*  --  1.12*  --     Estimated Creatinine Clearance: 117.6 mL/min (by C-G formula based on Cr of 1.1).   Medications:  Infusions:  . heparin 1,500 Units/hr (01/24/15 [id number])    Assessment: 49 y/o male who presented to ED with chest pain. He had recent PCI with DES to mLAD. Also with recent cocaine use. He continues on IV heparin for NSTEMI with a therapeutic level of 0.33 on 1500 units/hr. No bleeding noted, H/H are normal, platelets normal from 4/29.  Goal of Therapy:  Heparin level 0.3-0.7 units/ml Monitor platelets by anticoagulation protocol: Yes   Plan:  - Continue heparin drip at 1500 units/hr - 6 hr confirmatory heparin level - Daily heparin level and CBC - Observation vs cath  [facility] IncJennifer [place], Pharm.D., BCPS Clinical Pharmacist Pager: [phone] 01/24/2015 2:08 PM

## 2015-01-24 NOTE — ED Notes (Signed)
Dr. Bebe ShaggyWickline made aware of elevated troponin

## 2015-01-24 NOTE — Progress Notes (Signed)
01/24/15  Pharmacy- heparin 1740  Heparin level 0.2 on 1500 units/hr  A/P:  49yo male with NSTEMI and recent cocaine use.  Heparin level is now subtherapeutic vs initial level of 0.33 with no change in rate.  There have been no issues with the pump and the pt has had no bleeding per d/w RN.  1-  Heparin 1500 units IV x 1 and increase to 1700 units/hr (2 units/kg/hr) 2-  Repeat heparin level in 6hr 3-  Monitor for s/s of bleeding  Marisue HumbleKendra Jeliyah Middlebrooks, PharmD Clinical Pharmacist [facility]

## 2015-01-24 NOTE — ED Notes (Signed)
Lab called with critical value:  Troponin 0.70.  Dr Bebe ShaggyWickline made aware

## 2015-01-24 NOTE — Progress Notes (Signed)
Received pt from ER via stretcher. Pt assisted to bed.VS obtained. Denies c/o CP at this time. Heparin gtt infusing via L anticubital at 15cc/hr/

## 2015-01-24 NOTE — H&P (Cosign Needed)
Patient ID: Chris HugerMaurice D Jablonowski MRN: [id number], DOB/AGE: 49/04/1966   Admit date: 01/24/2015   Primary Physician: Rudi HeapMOORE, DONALD, MD Primary Cardiologist: Nahser  CC: NSTEMI  Problem List  Past Medical History  Diagnosis Date  . Hypertension   . COPD (chronic obstructive pulmonary disease)   . Asthma   . Arthritis   . Coronary artery disease   . Sleep apnea     USES CPAP  . Shortness of breath dyspnea   . Pneumonia     HX OF PNA  . Depression   . GERD (gastroesophageal reflux disease)     Past Surgical History  Procedure Laterality Date  . Knee surgery    . Coronary stent placement  12/22/2014    LAD  . Cardiac catheterization  12/22/2014  . Left heart catheterization with coronary angiogram N/A 12/22/2014    Procedure: LEFT HEART CATHETERIZATION WITH CORONARY ANGIOGRAM;  Surgeon: Corky CraftsJayadeep S Varanasi, MD;  [facility];  Service: Cardiovascular;  Laterality: N/A;     Allergies  No Known Allergies  HPI 50M history of HTN and CAD s/p recent PCI with DES to mLAD on 12/22/2014 now presenting with NSTEMI. He reports that since his last PCI he has been participating in cardiac rehab but on Friday (4d ago) he started developing some chest discomfort [facility]. He took SL NTG x 1, went to the ED but pain resolved and wait was too long so he left AMA. On Saturday he had intermittent chest pain lasting 1 hour, slightly worse w/ activity but he was pretty sedentary that day. He used cocaine that day and felt well Sunday but Monday/Tues AM (5 hrs ago) he woke from sleep with severe substernal CP. He reports that he has been taking his medications. Given the severity of the pain he called EMS.  En route he received more SL NTG and nitro paste. In the ED VS 61 124/64. EKG did not show concerning abnormalities but TnI was 0.70. His CP now resolved with NTG but he is complaining of severe HA.  Home Medications  Prior to Admission medications     Medication Sig Start Date End Date Taking? Authorizing Provider  acetaminophen (TYLENOL) 325 MG tablet Take 2 tablets (650 mg total) by mouth every 4 (four) hours as needed for headache or mild pain. 12/23/14  Yes Abelino DerrickLuke K Kilroy, PA-C  aspirin 81 MG chewable tablet Chew 1 tablet (81 mg total) by mouth daily. 12/23/14  Yes Luke K Kilroy, PA-C  atorvastatin (LIPITOR) 80 MG tablet Take 1 tablet (80 mg total) by mouth daily at 6 PM. Patient taking differently: Take 40 mg by mouth daily at 6 PM.  12/23/14  Yes Abelino DerrickLuke K Kilroy, PA-C  cloNIDine (CATAPRES) 0.3 MG tablet Take 0.3 mg by mouth 2 (two) times daily.   Yes Historical Provider, MD  furosemide (LASIX) 40 MG tablet Take 1 tablet (40 mg total) by mouth daily. 12/24/14  Yes Luke K Kilroy, PA-C  hydrALAZINE (APRESOLINE) 25 MG tablet Take 1 tablet (25 mg total) by mouth 3 (three) times daily. 01/16/15  Yes Dyann KiefMichele M Lenze, PA-C  ibuprofen (ADVIL,MOTRIN) 800 MG tablet Take 0.5 tablets (400 mg total) by mouth 3 (three) times daily. 12/23/14  Yes Luke K Kilroy, PA-C  lisinopril (PRINIVIL,ZESTRIL) 40 MG tablet Take 2 tablets (80 mg total) by mouth daily. 12/23/14  Yes Luke K Kilroy, PA-C  metoprolol tartrate (LOPRESSOR) 25 MG tablet Take 25 mg by mouth 2 (  two) times daily.   Yes Historical Provider, MD  nitroGLYCERIN (NITROSTAT) 0.4 MG SL tablet Place 1 tablet (0.4 mg total) under the tongue every 5 (five) minutes as needed for chest pain. 12/23/14  Yes Luke K Kilroy, PA-C  potassium chloride SA (K-DUR,KLOR-CON) 20 MEQ tablet Take 1 tablet (20 mEq total) by mouth 2 (two) times daily. Patient taking differently: Take 30 mEq by mouth 2 (two) times daily.  12/24/14  Yes Abelino Derrick, PA-C  prasugrel (EFFIENT) 10 MG TABS tablet Take 1 tablet (10 mg total) by mouth daily. 12/23/14  Yes Luke K Kilroy, PA-C  tiotropium (SPIRIVA) 18 MCG inhalation capsule Place 18 mcg into inhaler and inhale daily as needed (Pt states he does not use daily but uses as he feels he needs it.).   Yes  Historical Provider, MD    Family History  Family History  Problem Relation Age of Onset  . Heart attack Father   . Cancer Mother     Social History  History   Social History  . Marital Status: Legally Separated    Spouse Name: N/A  . Number of Children: N/A  . Years of Education: N/A   Occupational History  . Not on file.   Social History Main Topics  . Smoking status: Current Every Day Smoker -- 0.50 packs/day for 30 years    Types: Cigarettes  . Smokeless tobacco: Never Used     Comment: " I have  tried vapor cigarettes"  . Alcohol Use: Yes     Comment: occ.  . Drug Use: Yes    Special: Cocaine  . Sexual Activity: Not on file   Other Topics Concern  . Not on file   Social History Narrative     Review of Systems General:  No chills, fever, night sweats or weight changes.  Cardiovascular:  +chest pain, +dyspnea on exertion, -edema, -orthopnea, -palpitations, -paroxysmal nocturnal dyspnea. Dermatological: No rash, lesions/masses Respiratory: No cough, dyspnea Urologic: No hematuria, dysuria Abdominal:   No nausea, vomiting, diarrhea, bright red blood per rectum, melena, or hematemesis Neurologic:  No visual changes, wkns, changes in mental status. All other systems reviewed and are otherwise negative except as noted above.  Physical Exam  Blood pressure 124/64, pulse 61, temperature 98.4 F (36.9 C), temperature source Oral, resp. rate 8, SpO2 98 %.  General: Pleasant, NAD, morbidly obese Psych: Normal affect. Neuro: Alert and oriented X 3. Moves all extremities spontaneously. HEENT: Normal  Neck: Supple without bruits or JVD. Lungs:  Resp regular and unlabored, CTA. Heart: RRR no s3, s4, or murmurs. Abdomen: Soft, non-tender, non-distended, BS + x 4.  Extremities: No clubbing, cyanosis or edema. DP/PT/Radials 2+ and equal bilaterally.  Labs  Troponin [facility] of Care Test)  Recent Labs  01/24/15 0324  TROPIPOC 0.46*    Recent Labs   01/24/15 0320  TROPONINI 0.70*   Lab Results  Component Value Date   WBC 7.6 01/20/2015   HGB 16.3 01/24/2015   HCT 48.0 01/24/2015   MCV 91.9 01/20/2015   PLT 213 01/20/2015    Recent Labs Lab 01/20/15 1100 01/24/15 0327  NA 141 143  K 3.5 3.2*  CL 108 103  CO2 26  --   BUN 13 12  CREATININE 1.24 1.10  CALCIUM 8.7  --   GLUCOSE 128* 99   Lab Results  Component Value Date   CHOL 114 12/23/2014   HDL 31* 12/23/2014   LDLCALC 65 12/23/2014   TRIG 91 12/23/2014  No results found for: DDIMER   Radiology/Studies  Dg Chest 2 View  01/20/2015   CLINICAL DATA:  Left-sided chest pain. The patient became short of breath while walking on treadmill.  EXAM: CHEST  2 VIEW  COMPARISON:  12/22/2014  FINDINGS: Mildly tortuous thoracic aorta. Mild enlargement of the cardiopericardial silhouette. Upper zone pulmonary vascular indistinctness and vascular plethora without overt edema. No airspace opacity or pleural effusion identified.  IMPRESSION: 1. Mild enlargement of the cardiopericardial silhouette and suspected pulmonary venous hypertension, without overt edema.   Electronically Signed   By: Gaylyn Rong M.D.   On: 01/20/2015 11:38    ECG NSR@71bpm , no acute ST-TW changes concerning for acute ischemia.  TTE 12/23/2014 Study Conclusions  - Left ventricle: The cavity size was normal. Wall thickness was increased in a pattern of moderate LVH. Systolic function was normal. The estimated ejection fraction was in the range of 55% to 65%. - Left atrium: The atrium was moderately dilated.  Transthoracic echocardiography. M-mode, complete 2D, spectral Doppler, and color Doppler. Birthdate: Patient birthdate: [birth year]. Age: Patient is 49 yr old. Sex: Gender: male. BMI: 52.3 kg/m^2. Blood pressure:   179/77 Patient status: Inpatient. Study date: Study date: 12/23/2014. Study time: 08:47 AM. Location: Echo laboratory.  Cath 12/22/2014 HEMODYNAMICS: Aortic  pressure was 140/94; LV pressure was 138/16; LVEDP 26. There was no gradient between the left ventricle and aorta.   ANGIOGRAPHIC DATA: The left main coronary artery is widely patent.  The left anterior descending artery is a large vessel proximally. In the mid vessel, there is a 95% stenosis. There is a segment of more normal vessel followed by an area of moderate stenosis at the origin of the second diagonal which is medium size. The distal LAD appears to have only mild atherosclerosis.  The left circumflex artery is is a large vessel. There is a large atrial branch which has a mild proximal stenosis. The first significant obtuse marginal is large and widely patent. The remainder of the circumflex is medium-sized and patent. There appears to be a moderate stenosis before the terminal OM.  The right coronary artery is large dominant vessel. It appears to originate from the left cusp. It arises from very close to the left main. It was best engaged with an AR-2 catheter. There is mild proximal disease. The posterior descending artery is medium-sized and patent. There is only mild disease. The posterior lateral artery is medium-sized and patent  LEFT VENTRICULOGRAM: Left ventricular angiogram was not done. LVEDP was 26 mmHg.  IMPRESSIONS:  1. Widely patent left main coronary artery. 2. Severe disease in the mid left anterior descending artery with moderate disease further distal. The 95% stenosis was treated with a 2.5 x 24 Synergy drug-eluting stent, postdilated with a 3.0 noncompliant balloon. 3. Mild to moderate disease in the left circumflex artery and its branches. 4. Mild to moderate disease in the right coronary artery. 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 5. Left ventricular systolic function not assessed . Elevated LVEDP 26 mmHg.  RECOMMENDATION: Overall difficult cardiac cath from the right radial approach due to difficulty in  locating the left main and RCA. He has a short ascending aorta.  Successful PCI of the culprit vessel LAD. Will use Effient since compliance may be an issue. We stress risk factor modification including avoiding illegal drugs as well as tobacco. He'll need weight loss. He will need an echocardiogram to assess left ventricular function. He may need Lasix  in AM. Shorter course of post cath fluid due to elevated LVEDP.  ASSESSMENT AND PLAN 1. NSTEMI 2. HTN 3. Cocaine use  55M history of HTN and CAD s/p recent PCI with DES to mLAD on 12/22/2014 now presenting with NSTEMI. He reports taking all of his medications so ddx for include plaque rupture vs vasospasm in one of the prior nonculprit vessels versus subacute stent thrombosis in the setting of cocaine use vs. Med noncompliance. He also continues to smoke at least 1/2 ppd -ASA 324, then 81mg  -Prasugrel 10mg  daily -Heparin gtt started -Continue BB, ACEi, statin -Defer repeat TTE for now -Serial enzymes.  -NPO for possible relook cath. 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

## 2015-01-24 NOTE — ED Notes (Signed)
Appointment time @ 14:15p

## 2015-01-24 NOTE — Care Management Note (Signed)
Case Management Note  Patient Details  Name: Chris Powell MRN: [id number] Date of Birth: 06/04/1966  Subjective/Objective:   Pt admitted for CP.  Pt suffered documented NSTEMI.      Action/Plan:  Pt could benefit from cessation of substance abuse information.  CM will continue to monitor for disposition needs.   Expected Discharge Date:  01/26/15               Expected Discharge Plan:  Home/Self Care  In-House Referral:  Clinical Social Work  Discharge planning Services  CM Consult  Post Acute Care Choice:    Choice offered to:     DME Arranged:    DME Agency:     HH Arranged:    HH Agency:     Status of Service:     Medicare Important Message Given:    Date Medicare IM Given:    Medicare IM give by:    Date Additional Medicare IM Given:    Additional Medicare Important Message give by:     If discussed at Long Length of Stay Meetings, dates discussed:    Additional Comments:  CM consulted social work for cocaine cessation.  Pt presented in ED 01/20/15 with similar complaints, pt left AMA.  No CM needs at this time.  Cherylann ParrClaxton, Jane Broughton S, RN 01/24/2015, 4:12 PM

## 2015-01-24 NOTE — ED Provider Notes (Signed)
CSN: [id number]     Arrival date & time 01/24/15  0235 History  This chart was scribed for Zadie Rhine, MD by Annye Asa, ED Scribe. This patient was seen in room A11C/A11C and the patient's care was started at 3:06 AM.    Chief Complaint  Patient presents with  . Chest Pain   Patient is a 49 y.o. male presenting with chest pain. The history is provided by the patient, medical records and the EMS personnel. No language interpreter was used.  Chest Pain Pain location:  Substernal area Pain quality: pressure   Pain radiates to:  Does not radiate Pain radiates to the back: no   Pain severity:  Moderate Onset quality:  Sudden Duration:  2 hours Timing:  Constant Progression:  Unchanged Chronicity:  New Context comment:  Waking from sleep Relieved by:  Nothing Worsened by:  Nothing tried Ineffective treatments:  Nitroglycerin and aspirin Associated symptoms: shortness of breath   Associated symptoms: no abdominal pain, no back pain, no cough, no fever, no nausea and not vomiting   Risk factors: coronary artery disease and hypertension   Risk factors: no prior DVT/PE     HPI Comments: ANISH VANA is a 49 y.o. male who presents to the Emergency Department via EMS complaining of substernal chest pain and slight SOB, described as nonradiating "pressure", waking him from sleep around 01:00 this morning. His pain is rated 7/10 at present; it is unaffected by deep breathing. He describes his current symptoms as similar to prior MI (1 month PTA). Patient took 2 NTG at home without relief. Per EMS, patient was given 324 ASA, 2 NTG, 1.5 in nitro paste and 3 rounds of nitro spray en route without relief. He denies fevers, vomiting, coughing, syncope, abdominal pain, back pain. He denies prior history of blood clots in his legs or lungs.   Per records, patient was seen by Dr. Blinda Leatherwood at [facility] yesterday, at which time patient presented with similar pain that resolved after NTG. Patient  states he "got tired of waiting" and left AMA.   Patient had stents placed one month PTA (12/22/14). Cardiologist is Dr. Elease Hashimoto. Records indicate that patient is a .5ppd smoker  Past Medical History  Diagnosis Date  . Hypertension   . COPD (chronic obstructive pulmonary disease)   . Asthma   . Arthritis   . Coronary artery disease   . Sleep apnea     USES CPAP  . Shortness of breath dyspnea   . Pneumonia     HX OF PNA  . Depression   . GERD (gastroesophageal reflux disease)    Past Surgical History  Procedure Laterality Date  . Knee surgery    . Coronary stent placement  12/22/2014    LAD  . Cardiac catheterization  12/22/2014  . Left heart catheterization with coronary angiogram N/A 12/22/2014    Procedure: LEFT HEART CATHETERIZATION WITH CORONARY ANGIOGRAM;  Surgeon: Corky Crafts, MD;  Location: [facility] CATH LAB;  Service: Cardiovascular;  Laterality: N/A;   Family History  Problem Relation Age of Onset  . Heart attack Father   . Cancer Mother    History  Substance Use Topics  . Smoking status: Current Every Day Smoker -- 0.50 packs/day for 30 years    Types: Cigarettes  . Smokeless tobacco: Never Used     Comment: " I have  tried vapor cigarettes"  . Alcohol Use: Yes     Comment: occ.    Review of Systems  Constitutional: Negative for fever.  Respiratory: Positive for shortness of breath. Negative for cough.   Cardiovascular: Positive for chest pain.  Gastrointestinal: Negative for nausea, vomiting, abdominal pain and blood in stool.  Musculoskeletal: Negative for back pain.  Neurological: Negative for syncope.  All other systems reviewed and are negative.   Allergies  Review of patient's allergies indicates no known allergies.  Home Medications   Prior to Admission medications   Medication Sig Start Date End Date Taking? Authorizing Provider  acetaminophen (TYLENOL) 325 MG tablet Take 2 tablets (650 mg total) by mouth every 4 (four) hours as needed for  headache or mild pain. 12/23/14   Abelino DerrickLuke K Kilroy, PA-C  aspirin 81 MG chewable tablet Chew 1 tablet (81 mg total) by mouth daily. 12/23/14   Abelino DerrickLuke K Kilroy, PA-C  atorvastatin (LIPITOR) 80 MG tablet Take 1 tablet (80 mg total) by mouth daily at 6 PM. Patient taking differently: Take 40 mg by mouth daily at 6 PM.  12/23/14   Abelino DerrickLuke K Kilroy, PA-C  cloNIDine (CATAPRES) 0.3 MG tablet Take 0.3 mg by mouth 2 (two) times daily.    Historical Provider, MD  furosemide (LASIX) 40 MG tablet Take 1 tablet (40 mg total) by mouth daily. 12/24/14   Abelino DerrickLuke K Kilroy, PA-C  hydrALAZINE (APRESOLINE) 25 MG tablet Take 1 tablet (25 mg total) by mouth 3 (three) times daily. 01/16/15   Dyann KiefMichele M Lenze, PA-C  ibuprofen (ADVIL,MOTRIN) 800 MG tablet Take 0.5 tablets (400 mg total) by mouth 3 (three) times daily. 12/23/14   Abelino DerrickLuke K Kilroy, PA-C  lisinopril (PRINIVIL,ZESTRIL) 40 MG tablet Take 2 tablets (80 mg total) by mouth daily. 12/23/14   Abelino DerrickLuke K Kilroy, PA-C  metoprolol tartrate (LOPRESSOR) 25 MG tablet Take 25 mg by mouth 2 (two) times daily.    Historical Provider, MD  nitroGLYCERIN (NITROSTAT) 0.4 MG SL tablet Place 1 tablet (0.4 mg total) under the tongue every 5 (five) minutes as needed for chest pain. 12/23/14   Abelino DerrickLuke K Kilroy, PA-C  potassium chloride SA (K-DUR,KLOR-CON) 20 MEQ tablet Take 1 tablet (20 mEq total) by mouth 2 (two) times daily. 12/24/14   Abelino DerrickLuke K Kilroy, PA-C  prasugrel (EFFIENT) 10 MG TABS tablet Take 1 tablet (10 mg total) by mouth daily. 12/23/14   Abelino DerrickLuke K Kilroy, PA-C  tiotropium (SPIRIVA) 18 MCG inhalation capsule Place 18 mcg into inhaler and inhale daily as needed (Pt states he does not use daily but uses as he feels he needs it.).    Historical Provider, MD   BP 128/76 mmHg  Pulse 78  Temp(Src) 98.4 F (36.9 C) (Oral)  Resp 26  SpO2 97% Physical Exam  Nursing note and vitals reviewed.  CONSTITUTIONAL: Well developed/well nourished HEAD: Normocephalic/atraumatic EYES: EOMI/PERRL ENMT: Mucous membranes  moist NECK: supple no meningeal signs SPINE/BACK:entire spine nontender CV: S1/S2 noted, no murmurs/rubs/gallops noted LUNGS: Lungs are clear to auscultation bilaterally, no apparent distress ABDOMEN: soft, nontender, no rebound or guarding, bowel sounds noted throughout abdomen GU:no cva tenderness NEURO: Pt is awake/alert/appropriate, moves all extremitiesx4.  No facial droop.   EXTREMITIES: pulses normal/equal, full ROM SKIN: warm, color normal PSYCH: no abnormalities of mood noted, alert and oriented to situation  ED Course  Procedures  CRITICAL CARE Performed by: Joya GaskinsWICKLINE,Tyrann Donaho W Total critical care time: 33 Critical care time was exclusive of separately billable procedures and treating other patients. Critical care was necessary to treat or prevent imminent or life-threatening deterioration. Critical care was time spent personally by me on  the following activities: development of treatment plan with patient and/or surrogate as well as nursing, discussions with consultants, evaluation of patient's response to treatment, examination of patient, obtaining history from patient or surrogate, ordering and performing treatments and interventions, ordering and review of laboratory studies, ordering and review of radiographic studies, pulse oximetry and re-evaluation of patient's condition. PATIENT NONSTEMI REQUIRING IV HEPARIN AND CARDIOLOGY CONSULTATION  DIAGNOSTIC STUDIES: Oxygen Saturation is 97% on RA, adequate by my interpretation.    COORDINATION OF CARE: 3:11 AM Discussed treatment plan with pt at bedside and pt agreed to plan.  3:56 AM Pt with non-stemi He is still having CP Will admit Denies recent GI bleed, heparin ordered 5:26 AM D/w cardiology fellow Will evaluate for admission Pt had reported some improvement in chest pain while in the ED BP 124/64 mmHg  Pulse 61  Temp(Src) 98.4 F (36.9 C) (Oral)  Resp 8  SpO2 98%  Labs Review Labs Reviewed  TROPONIN I -  Abnormal; Notable for the following:    Troponin I 0.70 (*)    All other components within normal limits  I-STAT CHEM 8, ED - Abnormal; Notable for the following:    Potassium 3.2 (*)    Calcium, Ion 1.10 (*)    All other components within normal limits  I-STAT TROPOININ, ED - Abnormal; Notable for the following:    Troponin i, poc 0.46 (*)    All other components within normal limits  HEPARIN LEVEL (UNFRACTIONATED)       ED ECG REPORT   Date: 01/24/2015 0247am  Rate: 71  Rhythm: normal sinus rhythm  QRS Axis: normal  Intervals: normal  ST/T Wave abnormalities: nonspecific ST changes  Conduction Disutrbances:none  Medications  heparin ADULT infusion 100 units/mL (25000 units/250 mL) (1,500 Units/hr Intravenous New Bag/Given 01/24/15 0508)  morphine 4 MG/ML injection 4 mg (4 mg Intravenous Given 01/24/15 0419)  heparin bolus via infusion 4,000 Units (4,000 Units Intravenous Given 01/24/15 0508)  acetaminophen (TYLENOL) tablet 650 mg (650 mg Oral Given 01/24/15 0520)   Pt already took ASA He has nitropaste on at this time   MDM   Final diagnoses:  Non-STEMI (non-ST elevated myocardial infarction)    Nursing notes including past medical history and social history reviewed and considered in documentation Labs/vital reviewed myself and considered during evaluation   I personally performed the services described in this documentation, which was scribed in my presence. The recorded information has been reviewed and is accurate.       Zadie Rhine, MD 01/24/15 [phone]

## 2015-01-24 NOTE — Progress Notes (Signed)
UR Completed. Kurtiss Wence, RN, BSN.  [phone] 

## 2015-01-24 NOTE — Progress Notes (Signed)
RT took CPAP to patient's room and set it up. RT added water and set pressure to 4.5cmH2O. Patient stated he did not need help putting himself on and off. RT informed patient to have RN call RT if he needed any help.

## 2015-01-24 NOTE — Progress Notes (Signed)
ANTICOAGULATION CONSULT NOTE - Initial Consult  Pharmacy Consult for Heparin  Indication: chest pain/ACS  No Known Allergies  Patient Measurements: ~153 kg  Vital Signs: Temp: 98.4 F (36.9 C) (05/03 0248) Temp Source: Oral (05/03 0248) BP: 141/77 mmHg (05/03 0345) Pulse Rate: 62 (05/03 0345)  Labs:  Recent Labs  01/24/15 0327  HGB 16.3  HCT 48.0  CREATININE 1.10    Estimated Creatinine Clearance: 117.6 mL/min (by C-G formula based on Cr of 1.1).   Medical History: Past Medical History  Diagnosis Date  . Hypertension   . COPD (chronic obstructive pulmonary disease)   . Asthma   . Arthritis   . Coronary artery disease   . Sleep apnea     USES CPAP  . Shortness of breath dyspnea   . Pneumonia     HX OF PNA  . Depression   . GERD (gastroesophageal reflux disease)     Assessment: Heparin for CP, recent h/o stent placement on ASA/effient PTA, CBC/renal function good, mildly elevated troponin, other labs as above.    Goal of Therapy:  Heparin level 0.3-0.7 units/ml Monitor platelets by anticoagulation protocol: Yes   Plan:  -Heparin 4000 units BOLUS -Start heparin drip at 1500 units/hr -1100 HL -Daily CBC/HL -Monitor for bleeding  Abran DukeLedford, Shaunette Gassner 01/24/2015,4:18 AM

## 2015-01-24 NOTE — Progress Notes (Signed)
Patient in [facility] today. Was on TM for 7 minutes and started complaining of left sided chest squeezing. Patient denied any other symptoms.  Patient was nondiaphoretic and no SOB noted. Patient had no increased pain with palpation or deep breathing.  Patient advised to take one of his ntg sl.  Patients pain was rating 5 then went to 0 after taking one ntg. Patient was taken to ED.

## 2015-01-24 NOTE — ED Notes (Signed)
Per EMS: Pt began having chest pain ~1am this morning. Had stents placed ~ 1 month ago. Pt took Nitro x 2. No relief. EMS gave 324 ASA, Nitro x 2, 1.5in nitro paste and 3 rounds of nitro spray. No relief from pain. Pt hypertensive, 180's. NSR in the 70's on monitor. Rates pain 8/10.

## 2015-01-25 ENCOUNTER — Encounter (HOSPITAL_COMMUNITY): Payer: Medicaid Other

## 2015-01-25 ENCOUNTER — Telehealth: Payer: Self-pay | Admitting: Family

## 2015-01-25 ENCOUNTER — Inpatient Hospital Stay (HOSPITAL_COMMUNITY): Payer: Medicaid Other

## 2015-01-25 ENCOUNTER — Ambulatory Visit: Payer: Self-pay | Admitting: Family

## 2015-01-25 ENCOUNTER — Encounter: Payer: Medicaid Other | Admitting: Physician Assistant

## 2015-01-25 DIAGNOSIS — I214 Non-ST elevation (NSTEMI) myocardial infarction: Principal | ICD-10-CM

## 2015-01-25 DIAGNOSIS — F1410 Cocaine abuse, uncomplicated: Secondary | ICD-10-CM

## 2015-01-25 DIAGNOSIS — Z9861 Coronary angioplasty status: Secondary | ICD-10-CM

## 2015-01-25 DIAGNOSIS — I2510 Atherosclerotic heart disease of native coronary artery without angina pectoris: Secondary | ICD-10-CM

## 2015-01-25 DIAGNOSIS — R079 Chest pain, unspecified: Secondary | ICD-10-CM

## 2015-01-25 LAB — BASIC METABOLIC PANEL
ANION GAP: 11 (ref 5–15)
BUN: 15 mg/dL (ref 6–20)
CHLORIDE: 102 mmol/L (ref 101–111)
CO2: 26 mmol/L (ref 22–32)
Calcium: 9.1 mg/dL (ref 8.9–10.3)
Creatinine, Ser: 1.18 mg/dL (ref 0.61–1.24)
GFR calc Af Amer: >60 mL/min (ref >60)
GFR calc non Af Amer: >60 mL/min (ref >60)
Glucose, Bld: 128 mg/dL — ABNORMAL HIGH (ref 70–99)
POTASSIUM: 3.9 mmol/L (ref 3.5–5.1)
Sodium: 139 mmol/L (ref 135–145)

## 2015-01-25 LAB — CBC
HCT: 45.7 % (ref 39.0–52.0)
Hemoglobin: 15.4 g/dL (ref 13.0–17.0)
MCH: 30.5 pg (ref 26.0–34.0)
MCHC: 33.7 g/dL (ref 30.0–36.0)
MCV: 90.5 fL (ref 78.0–100.0)
Platelets: 236 10*3/uL (ref 150–400)
RBC: 5.05 MIL/uL (ref 4.22–5.81)
RDW: 14.6 % (ref 11.5–15.5)
WBC: 7.5 10*3/uL (ref 4.0–10.5)

## 2015-01-25 LAB — LIPID PANEL
Cholesterol: 113 mg/dL (ref 0–200)
HDL: 33 mg/dL — ABNORMAL LOW (ref >40)
LDL CALC: 64 mg/dL (ref 0–99)
Total CHOL/HDL Ratio: 3.4 RATIO
Triglycerides: 82 mg/dL (ref <150)
VLDL: 16 mg/dL (ref 0–40)

## 2015-01-25 LAB — MAGNESIUM: Magnesium: 2.2 mg/dL (ref 1.7–2.4)

## 2015-01-25 LAB — HEPARIN LEVEL (UNFRACTIONATED)
HEPARIN UNFRACTIONATED: 0.25 [IU]/mL — AB (ref 0.30–0.70)
Heparin Unfractionated: 0.55 IU/mL (ref 0.30–0.70)

## 2015-01-25 MED ORDER — IBUPROFEN 800 MG PO TABS: 400.0000 mg | ORAL_TABLET | Freq: Three times a day (TID) | ORAL | Status: DC | PRN

## 2015-01-25 MED ORDER — HEPARIN BOLUS VIA INFUSION
2000.0000 [IU] | Freq: Once | INTRAVENOUS | Status: AC
  Administered 2015-01-25: 2000 [IU] via INTRAVENOUS
  Filled 2015-01-25: qty 2000

## 2015-01-25 MED ORDER — AMLODIPINE BESYLATE 5 MG PO TABS
5.0000 mg | ORAL_TABLET | Freq: Every day | ORAL | Status: DC
  Administered 2015-01-25: 5 mg via ORAL
  Filled 2015-01-25: qty 1

## 2015-01-25 MED ORDER — POTASSIUM CHLORIDE CRYS ER 20 MEQ PO TBCR: 20.0000 meq | EXTENDED_RELEASE_TABLET | Freq: Two times a day (BID) | ORAL | Status: DC

## 2015-01-25 MED ORDER — AMLODIPINE BESYLATE 5 MG PO TABS: 5.0000 mg | ORAL_TABLET | Freq: Every day | ORAL | Status: DC

## 2015-01-25 NOTE — Progress Notes (Signed)
Subjective:  No chest pain  Objective:  Vital Signs in the last 24 hours: Temp:  [98.3 F (36.8 C)-99 F (37.2 C)] 99 F (37.2 C) (05/04 1114) Pulse Rate:  [54-73] 65 (05/04 1114) Resp:  [13-22] 16 (05/04 1114) BP: (153-183)/(79-107) 158/88 mmHg (05/04 1114) SpO2:  [96 %-99 %] 97 % (05/04 1114) Weight:  [333 lb 8.9 oz (151.3 kg)-337 lb 8 oz (153.089 kg)] 337 lb 8 oz (153.089 kg) (05/04 0629)  Intake/Output from previous day:  Intake/Output Summary (Last 24 hours) at 01/25/15 1119 Last data filed at 01/25/15 0756  Gross per 24 hour  Intake    888 ml  Output    300 ml  Net    588 ml    Physical Exam: General appearance: alert, cooperative, no distress and morbidly obese Lungs: clear to auscultation bilaterally Heart: regular rate and rhythm Abdomen: obese Extremities: no edema   Rate: 65  Rhythm: normal sinus rhythm  Lab Results:  Recent Labs  01/24/15 0327 01/25/15 0015  WBC  --  7.5  HGB 16.3 15.4  PLT  --  236    Recent Labs  01/24/15 0327 01/25/15 0015  NA 143 139  K 3.2* 3.9  CL 103 102  CO2  --  26  GLUCOSE 99 128*  BUN 12 15  CREATININE 1.10 1.18    Recent Labs  01/24/15 1349 01/24/15 1839  TROPONINI 0.80* 0.77*   No results for input(s): INR in the last 72 hours.  Scheduled Meds: . aspirin  81 mg Oral Daily  . atorvastatin  80 mg Oral q1800  . hydrALAZINE  25 mg Oral TID  . lisinopril  80 mg Oral Daily  . metoprolol tartrate  25 mg Oral BID  . prasugrel  10 mg Oral Daily   Continuous Infusions: . heparin 1,950 Units/hr (01/25/15 0720)   PRN Meds:.acetaminophen, nitroGLYCERIN, ondansetron (ZOFRAN) IV, tiotropium   Imaging: No results found.  Cardiac Studies: Echo 12/23/14 Study Conclusions  - Left ventricle: The cavity size was normal. Wall thickness was increased in a pattern of moderate LVH. Systolic function was normal. The estimated ejection fraction was in the range of 55% to 65%. - Left atrium: The atrium  was moderately dilated.   Assessment/Plan:  49 y/o obese AA male with a history of HTN and CAD s/p recent PCI with DES to mLAD on 12/22/2014 admitted 01/24/15 with NSTEMI. He reports that since his last PCI he has been participating in cardiac rehab but on Friday 01/20/15 he started developing some chest discomfort while at cardiac rehab at [facility]. He took SL NTG x 1, went to the ED but pain resolved and wait was too long so he left AMA. On Saturday 01/21/15 he had intermittent chest pain lasting 1 hour, slightly worse w/ activity but he was pretty sedentary that day. He used cocaine that day and felt well Sunday but Monday/Tues AM 01/24/15 he woke from sleep with severe substernal CP and presented to the ED. He reports that he has been taking his medications. His Troponin was trending down by 01/20/15 but has again gone up to 1.12.   Principal Problem:   NSTEMI (non-ST elevated myocardial infarction) Active Problems:   Cocaine abuse   CAD - S/P LAD DES 3/11/22/14   Morbid obesity-BMI 51   COPD (chronic obstructive pulmonary disease)   HTN (hypertension)   PLAN: Will review with MD. His Troponin suggest another event in the setting of cocaine use. He is on  Heparin. He is hypertensive. Will add Norvasc to Hydralyzine, high dose ACE, and Lopressor 25 mg BID.   Corine ShelterLuke Kilroy PA-C 01/25/2015, 11:19 AM [phone]  I have seen and examined the patient along with Corine ShelterLuke Kilroy PA-C.  I have reviewed the chart, notes and new data.  I agree with PA/NP's note.  Key new complaints: no further angina Key examination changes: no CHF or arrhythmia Key new findings / data: decreasing tropnin  PLAN: Critical to avoid future cocaine use - the likely cause of his NSTEMI. Add vasodilator. Reinforce DAPT compliance.  Thurmon FairMihai Maanvi Lecompte, MD, [facility] CenterFACC [facility] [phone] 01/25/2015, 1:21 PM

## 2015-01-25 NOTE — Progress Notes (Signed)
Pt's assessment unchanged from this am. Pt in stable condition. Discharge instructions given and education done. Pt d/c'd to private vehicle with brother

## 2015-01-25 NOTE — Discharge Instructions (Signed)
Angina Pectoris  Angina pectoris, often just called angina, is extreme discomfort in your chest, neck, or arm caused by a lack of blood in the middle and thickest layer of your heart wall (myocardium). It may feel like tightness or heavy pressure. It may feel like a crushing or squeezing pain. Some people say it feels like gas or indigestion. It may go down your shoulders, back, and arms. Some people may have symptoms other than pain. These symptoms include fatigue, shortness of breath, cold sweats, or nausea. There are four different types of angina:  · Stable angina--Stable angina usually occurs in episodes of predictable frequency and duration. It usually is brought on by physical activity, emotional stress, or excitement. These are all times when the myocardium needs more oxygen. Stable angina usually lasts a few minutes and often is relieved by taking a medicine that can be taken under your tongue (sublingually). The medicine is called nitroglycerin. Stable angina is caused by a buildup of plaque inside the arteries, which restricts blood flow to the heart muscle (atherosclerosis).  · Unstable angina--Unstable angina can occur even when your body experiences little or no physical exertion. It can occur during sleep. It can also occur at rest. It can suddenly increase in severity or frequency. It might not be relieved by sublingual nitroglycerin. It can last up to 30 minutes. The most common cause of unstable angina is a blood clot that has developed on the top of plaque buildup inside a coronary artery. It can lead to a heart attack if the blood clot completely blocks the artery.  · Microvascular angina--This type of angina is caused by a disorder of tiny blood vessels called arterioles. Microvascular angina is more common in women. The pain may be more severe and last longer than other types of angina pectoris.  · Prinzmetal or variant angina--This type of angina pectoris usually occurs when your body  experiences little or no physical exertion. It especially occurs in the early morning hours. It is caused by a spasm of your coronary artery.  HOME CARE INSTRUCTIONS   · Only take over-the-counter and prescription medicines as directed by your health care provider.  · Stay active or increase your exercise as directed by your health care provider.  · Limit strenuous activity as directed by your health care provider.  · Limit heavy lifting as directed by your health care provider.  · Maintain a healthy weight.  · Learn about and eat heart-healthy foods.  · Do not use any tobacco products including cigarettes, chewing tobacco or electronic cigarettes.  SEEK IMMEDIATE MEDICAL CARE IF:   You experience the following symptoms:  · Chest, neck, deep shoulder, or arm pain or discomfort that lasts more than a few minutes.  · Chest, neck, deep shoulder, or arm pain or discomfort that goes away and comes back, repeatedly.  · Heavy sweating with discomfort, without a noticeable cause.  · Shortness of breath or difficulty breathing.  · Angina that does not get better after a few minutes of rest or after taking sublingual nitroglycerin.  These can all be symptoms of a heart attack, which is a medical emergency! Get medical help at once. Call your local emergency service (911 in [place] immediately. Do not  drive yourself to the hospital and do not  wait to for your symptoms to go away.  MAKE SURE YOU:  · Understand these instructions.  · Will watch your condition.  · Will get help right away if you are not   doing well or get worse.  Document Released: 09/09/2005 Document Revised: 09/14/2013 Document Reviewed: 01/11/2014  ExitCare® Patient Information ©2015 [employer]. This information is not intended to replace advice given to you by your health care provider. Make sure you discuss any questions you have with your health care provider.

## 2015-01-25 NOTE — Progress Notes (Signed)
ANTICOAGULATION CONSULT NOTE - Follow Up Consult  Pharmacy Consult for Heparin Indication: NSTEMI  No Known Allergies  Patient Measurements: Height: 5\' 7"  (170.2 cm) Weight: (!) 337 lb 8 oz (153.089 kg) IBW/kg (Calculated) : 66.1 Heparin Dosing Weight: 104 kg  Vital Signs: Temp: 98.4 F (36.9 C) (05/04 0753) Temp Source: Oral (05/04 0753) BP: 168/80 mmHg (05/04 0826) Pulse Rate: 70 (05/04 0753)  Labs:  Recent Labs  01/24/15 0327 01/24/15 0730  01/24/15 1349 01/24/15 1630 01/24/15 1839 01/25/15 0015 01/25/15 0809  HGB 16.3  --   --   --   --   --  15.4  --   HCT 48.0  --   --   --   --   --  45.7  --   PLT  --   --   --   --   --   --  236  --   HEPARINUNFRC  --   --   < >  --  0.20*  --  0.25* 0.55  CREATININE 1.10  --   --   --   --   --  1.18  --   TROPONINI  --  1.12*  --  0.80*  --  0.77*  --   --   < > = values in this interval not displayed.  Estimated Creatinine Clearance: 109.3 mL/min (by C-G formula based on Cr of 1.18).  Assessment:   NSTEMI, recent cocaine use, hx PCI with DES of LAD 12/22/14.   Heparin level is now therapeutic (0.55) on 1950 units/hr, after several reboluses and rate increases.   Goal of Therapy:  Heparin level 0.3-0.7 units/ml Monitor platelets by anticoagulation protocol: Yes   Plan:   Continue heparin drip at 1950 units/hr.  Daily heparin level and CBC while on heparin.  Dennie Fettersgan, Leiana Rund Donovan, [place] Pager: [phone] 01/25/2015,11:08 AM

## 2015-01-25 NOTE — Progress Notes (Addendum)
ANTICOAGULATION CONSULT NOTE   Pharmacy Consult for heparin Indication: NSTEMI  No Known Allergies  Patient Measurements: Weight: 154 kg Height: 67" Heparin Dosing Weight: 104 kg  Vital Signs: Temp: 98.7 F (37.1 C) (05/04 0000) Temp Source: Oral (05/04 0000) BP: 165/83 mmHg (05/04 0000) Pulse Rate: 66 (05/04 0000)  Labs:  Recent Labs  01/24/15 0327 01/24/15 0730 01/24/15 1053 01/24/15 1349 01/24/15 1630 01/24/15 1839 01/25/15 0015  HGB 16.3  --   --   --   --   --  15.4  HCT 48.0  --   --   --   --   --  45.7  PLT  --   --   --   --   --   --  236  HEPARINUNFRC  --   --  0.33  --  0.20*  --  0.25*  CREATININE 1.10  --   --   --   --   --  1.18  TROPONINI  --  1.12*  --  0.80*  --  0.77*  --     Estimated Creatinine Clearance: 108.5 mL/min (by C-G formula based on Cr of 1.18).   Assessment: 49 y.o. male with chest pain for heparin  Goal of Therapy:  Heparin level 0.3-0.7 units/ml Monitor platelets by anticoagulation protocol: Yes   Plan:  Heparin 2000 units IV bolus, then increase heparin 1950 units/hr Check heparin level in 6 hours.   Geannie RisenGreg Kathyjo Briere, PharmD, BCPS  01/25/2015 1:59 AM

## 2015-01-25 NOTE — Discharge Summary (Signed)
Patient ID: Chris Powell,  MRN: [id number], DOB/AGE: 49/04/1966 49 y.o.  Admit date: 01/24/2015 DischLloyd Hugerarge date: 01/25/2015  Primary Care Provider: Rudi HeapMOORE, DONALD, MD Primary Cardiologist: Dr Elease HashimotoNahser  Discharge Diagnoses Principal Problem:   NSTEMI (non-ST elevated myocardial infarction) Active Problems:   Cocaine abuse   CAD - S/P LAD DES 3/11/22/14   Morbid obesity-BMI 51   COPD (chronic obstructive pulmonary disease)   HTN (hypertension)    Hospital Course:  49 y/o obese AA male with a history of HTN and CAD s/p recent PCI with DES to mLAD on 12/22/2014 admitted 01/24/15 with NSTEMI. He reports that since his last PCI he has been participating in cardiac rehab but on Friday 01/20/15 he started developing some chest discomfort while at cardiac rehab at [facility]. He took SL NTG x 1, went to the ED but pain resolved and wait was too long so he left AMA. On Saturday 01/21/15 he had intermittent chest pain lasting 1 hour, slightly worse w/ activity but he was pretty sedentary that day. He used cocaine that day and felt well Sunday but Monday/Tues AM 01/24/15 he woke from sleep with severe substernal CP and presented to the ED. He reports that he has been taking his medications. His Troponin was trending down by 01/20/15 but has again gone up to 1.12. He is pain free the morning of 5/4. An echo has been done, results are pending at discharge. Dr Royann Shiversroitoru feels he can go home today. We added Norvasc 5 mg at discharge for HTN. He will need a TOC f/u in 7 days. He was strongly encouraged to avoid cocaine.   Discharge Vitals:  Blood pressure 158/88, pulse 65, temperature 99 F (37.2 C), temperature source Oral, resp. rate 16, height 5\' 7"  (1.702 m), weight 337 lb 8 oz (153.089 kg), SpO2 97 %.    Labs: Results for orders placed or performed during the hospital encounter of 01/24/15 (from the past 24 hour(s))  Troponin I     Status: Abnormal   Collection Time: 01/24/15  1:49 PM  Result Value Ref  Range   Troponin I 0.80 (HH) <0.031 ng/mL  Heparin level (unfractionated)     Status: Abnormal   Collection Time: 01/24/15  4:30 PM  Result Value Ref Range   Heparin Unfractionated 0.20 (L) 0.30 - 0.70 IU/mL  Troponin I     Status: Abnormal   Collection Time: 01/24/15  6:39 PM  Result Value Ref Range   Troponin I 0.77 (HH) <0.031 ng/mL  Heparin level (unfractionated)     Status: Abnormal   Collection Time: 01/25/15 12:15 AM  Result Value Ref Range   Heparin Unfractionated 0.25 (L) 0.30 - 0.70 IU/mL  Basic metabolic panel     Status: Abnormal   Collection Time: 01/25/15 12:15 AM  Result Value Ref Range   Sodium 139 135 - 145 mmol/L   Potassium 3.9 3.5 - 5.1 mmol/L   Chloride 102 101 - 111 mmol/L   CO2 26 22 - 32 mmol/L   Glucose, Bld 128 (H) 70 - 99 mg/dL   BUN 15 6 - 20 mg/dL   Creatinine, Ser 4.091.18 0.61 - 1.24 mg/dL   Calcium 9.1 8.9 - 81.110.3 mg/dL   GFR calc non Af Amer >60 >60 mL/min   GFR calc Af Amer >60 >60 mL/min   Anion gap 11 5 - 15  Magnesium     Status: None   Collection Time: 01/25/15 12:15 AM  Result Value Ref Range  Magnesium 2.2 1.7 - 2.4 mg/dL  CBC     Status: None   Collection Time: 01/25/15 12:15 AM  Result Value Ref Range   WBC 7.5 4.0 - 10.5 K/uL   RBC 5.05 4.22 - 5.81 MIL/uL   Hemoglobin 15.4 13.0 - 17.0 g/dL   HCT 16.145.7 09.639.0 - 04.552.0 %   MCV 90.5 78.0 - 100.0 fL   MCH 30.5 26.0 - 34.0 pg   MCHC 33.7 30.0 - 36.0 g/dL   RDW 40.914.6 81.111.5 - 91.415.5 %   Platelets 236 150 - 400 K/uL  Lipid panel     Status: Abnormal   Collection Time: 01/25/15 12:15 AM  Result Value Ref Range   Cholesterol 113 0 - 200 mg/dL   Triglycerides 82 <782<150 mg/dL   HDL 33 (L) >95>40 mg/dL   Total CHOL/HDL Ratio 3.4 RATIO   VLDL 16 0 - 40 mg/dL   LDL Cholesterol 64 0 - 99 mg/dL  Heparin level (unfractionated)     Status: None   Collection Time: 01/25/15  8:09 AM  Result Value Ref Range   Heparin Unfractionated 0.55 0.30 - 0.70 IU/mL    Disposition:      Follow-up Information     Follow up with Nahser, Deloris PingPhilip J, MD.   Specialty:  Cardiology   Why:  offcie will call you for a f/u in a week   Contact information:   [address] [place] [address] [phone]       Discharge Medications:    Medication List    STOP taking these medications        cloNIDine 0.3 MG tablet  Commonly known as:  CATAPRES      TAKE these medications        acetaminophen 325 MG tablet  Commonly known as:  TYLENOL  Take 2 tablets (650 mg total) by mouth every 4 (four) hours as needed for headache or mild pain.     amLODipine 5 MG tablet  Commonly known as:  NORVASC  Take 1 tablet (5 mg total) by mouth daily.     aspirin 81 MG chewable tablet  Chew 1 tablet (81 mg total) by mouth daily.     atorvastatin 80 MG tablet  Commonly known as:  LIPITOR  Take 1 tablet (80 mg total) by mouth daily at 6 PM.     furosemide 40 MG tablet  Commonly known as:  LASIX  Take 1 tablet (40 mg total) by mouth daily.     hydrALAZINE 25 MG tablet  Commonly known as:  APRESOLINE  Take 1 tablet (25 mg total) by mouth 3 (three) times daily.     ibuprofen 800 MG tablet  Commonly known as:  ADVIL,MOTRIN  Take 0.5 tablets (400 mg total) by mouth every 8 (eight) hours as needed.     lisinopril 40 MG tablet  Commonly known as:  PRINIVIL,ZESTRIL  Take 2 tablets (80 mg total) by mouth daily.     metoprolol tartrate 25 MG tablet  Commonly known as:  LOPRESSOR  Take 25 mg by mouth 2 (two) times daily.     nitroGLYCERIN 0.4 MG SL tablet  Commonly known as:  NITROSTAT  Place 1 tablet (0.4 mg total) under the tongue every 5 (five) minutes as needed for chest pain.     potassium chloride SA 20 MEQ tablet  Commonly known as:  K-DUR,KLOR-CON  Take 1 tablet (20 mEq total) by mouth 2 (two) times daily.  prasugrel 10 MG Tabs tablet  Commonly known as:  EFFIENT  Take 1 tablet (10 mg total) by mouth daily.     tiotropium 18 MCG inhalation capsule  Commonly known as:  SPIRIVA    Place 18 mcg into inhaler and inhale daily as needed (Pt states he does not use daily but uses as he feels he needs it.).         Duration of Discharge Encounter: Greater than 30 minutes including physician time.  Jolene Provost PA-C 01/25/2015 12:08 PM

## 2015-01-25 NOTE — Telephone Encounter (Signed)
[facility], advised pt to CB once he is released from the hospital as they are going to advise him to follow up with Primary Care. Pt voiced understanding.

## 2015-01-25 NOTE — Progress Notes (Signed)
  Echocardiogram 2D Echocardiogram limited has been performed.  Jolinda Pinkstaff FRANCES 01/25/2015, 12:48 PM

## 2015-01-26 ENCOUNTER — Other Ambulatory Visit: Payer: Self-pay | Admitting: Cardiology

## 2015-01-27 ENCOUNTER — Encounter (HOSPITAL_COMMUNITY): Payer: Medicaid Other

## 2015-01-27 ENCOUNTER — Other Ambulatory Visit: Payer: Self-pay | Admitting: Cardiology

## 2015-01-27 ENCOUNTER — Telehealth: Payer: Self-pay | Admitting: Physician Assistant

## 2015-01-27 MED ORDER — NITROGLYCERIN 0.4 MG SL SUBL: 0.4000 mg | SUBLINGUAL_TABLET | SUBLINGUAL | Status: DC | PRN

## 2015-01-27 NOTE — Telephone Encounter (Signed)
Spoke with patient. He lost 1 bottle of NTG and has had to use quite a bit since getting Rx - has been to hospital due to cardiac issues this week. Is feeling better, states he was doing too much.   [facility] [place] [facility] [place]

## 2015-01-27 NOTE — Telephone Encounter (Signed)
New Message       Pt calling stating that [facility] his [facility]itroglycerin. Pt states that he has spoken to the refill department at our office and doesn't wish to talk to them again and only wants to speak to [facility]. Pt is requesting a call back.

## 2015-01-30 ENCOUNTER — Encounter (HOSPITAL_COMMUNITY): Payer: Medicaid Other

## 2015-01-30 ENCOUNTER — Telehealth: Payer: Self-pay | Admitting: Family

## 2015-01-30 NOTE — Telephone Encounter (Signed)
Appointment scheduled for 6/7 with Chris Rodneyhristy Hawks, FNP.

## 2015-01-30 NOTE — Telephone Encounter (Signed)
Please call in a new Rx for Kdur 20 meq 2 tablets [facility] [place].Pt is out of it.

## 2015-02-01 ENCOUNTER — Encounter (HOSPITAL_COMMUNITY): Payer: Medicaid Other

## 2015-02-02 ENCOUNTER — Other Ambulatory Visit: Payer: Medicaid Other

## 2015-02-03 ENCOUNTER — Encounter (HOSPITAL_COMMUNITY): Payer: Medicaid Other

## 2015-02-06 ENCOUNTER — Other Ambulatory Visit (INDEPENDENT_AMBULATORY_CARE_PROVIDER_SITE_OTHER): Payer: Medicaid Other | Admitting: *Deleted

## 2015-02-06 ENCOUNTER — Encounter (HOSPITAL_COMMUNITY): Payer: Medicaid Other

## 2015-02-06 DIAGNOSIS — E876 Hypokalemia: Secondary | ICD-10-CM | POA: Diagnosis not present

## 2015-02-06 LAB — BASIC METABOLIC PANEL
BUN: 19 mg/dL (ref 6–23)
CALCIUM: 9.7 mg/dL (ref 8.4–10.5)
CO2: 31 mEq/L (ref 19–32)
CREATININE: 1.37 mg/dL (ref 0.40–1.50)
Chloride: 103 mEq/L (ref 96–112)
GFR: 71.09 mL/min (ref >60.00)
GLUCOSE: 106 mg/dL — AB (ref 70–99)
Potassium: 3.8 mEq/L (ref 3.5–5.1)
Sodium: 137 mEq/L (ref 135–145)

## 2015-02-08 ENCOUNTER — Encounter (HOSPITAL_COMMUNITY)
Admission: RE | Admit: 2015-02-08 | Discharge: 2015-02-08 | Disposition: A | Discharge status: Home / Self Care | Payer: Medicaid Other | Source: Ambulatory Visit | Attending: Cardiovascular Disease | Admitting: Cardiovascular Disease

## 2015-02-08 DIAGNOSIS — I214 Non-ST elevation (NSTEMI) myocardial infarction: Secondary | ICD-10-CM | POA: Diagnosis not present

## 2015-02-08 DIAGNOSIS — Z955 Presence of coronary angioplasty implant and graft: Secondary | ICD-10-CM | POA: Diagnosis not present

## 2015-02-10 ENCOUNTER — Encounter (HOSPITAL_COMMUNITY): Payer: Medicaid Other

## 2015-02-13 ENCOUNTER — Encounter (HOSPITAL_COMMUNITY): Payer: Medicaid Other

## 2015-02-15 ENCOUNTER — Encounter (HOSPITAL_COMMUNITY): Payer: Medicaid Other

## 2015-02-17 ENCOUNTER — Encounter (HOSPITAL_COMMUNITY): Payer: Medicaid Other

## 2015-02-20 ENCOUNTER — Encounter (HOSPITAL_COMMUNITY): Payer: Medicaid Other

## 2015-02-22 ENCOUNTER — Encounter (HOSPITAL_COMMUNITY): Payer: Medicaid Other

## 2015-02-24 ENCOUNTER — Encounter (HOSPITAL_COMMUNITY): Payer: Medicaid Other

## 2015-02-27 ENCOUNTER — Encounter (HOSPITAL_COMMUNITY): Payer: Medicaid Other

## 2015-02-28 ENCOUNTER — Encounter: Payer: Self-pay | Admitting: Family

## 2015-02-28 ENCOUNTER — Ambulatory Visit (INDEPENDENT_AMBULATORY_CARE_PROVIDER_SITE_OTHER): Payer: Medicaid Other | Admitting: Family

## 2015-02-28 VITALS — BP 136/81 | HR 54 | Temp 98.0°F | Ht 67.0 in | Wt 329.0 lb

## 2015-02-28 DIAGNOSIS — I249 Acute ischemic heart disease, unspecified: Secondary | ICD-10-CM

## 2015-02-28 DIAGNOSIS — E785 Hyperlipidemia, unspecified: Secondary | ICD-10-CM | POA: Diagnosis not present

## 2015-02-28 DIAGNOSIS — E876 Hypokalemia: Secondary | ICD-10-CM

## 2015-02-28 DIAGNOSIS — E559 Vitamin D deficiency, unspecified: Secondary | ICD-10-CM | POA: Diagnosis not present

## 2015-02-28 DIAGNOSIS — I10 Essential (primary) hypertension: Secondary | ICD-10-CM | POA: Diagnosis not present

## 2015-02-28 DIAGNOSIS — F17200 Nicotine dependence, unspecified, uncomplicated: Secondary | ICD-10-CM

## 2015-02-28 DIAGNOSIS — K219 Gastro-esophageal reflux disease without esophagitis: Secondary | ICD-10-CM | POA: Diagnosis not present

## 2015-02-28 DIAGNOSIS — M25569 Pain in unspecified knee: Secondary | ICD-10-CM | POA: Diagnosis not present

## 2015-02-28 DIAGNOSIS — J42 Unspecified chronic bronchitis: Secondary | ICD-10-CM

## 2015-02-28 DIAGNOSIS — G8929 Other chronic pain: Secondary | ICD-10-CM

## 2015-02-28 DIAGNOSIS — Z720 Tobacco use: Secondary | ICD-10-CM | POA: Diagnosis not present

## 2015-02-28 DIAGNOSIS — E782 Mixed hyperlipidemia: Secondary | ICD-10-CM | POA: Insufficient documentation

## 2015-02-28 MED ORDER — FUROSEMIDE 40 MG PO TABS: 40.0000 mg | ORAL_TABLET | Freq: Every day | ORAL | Status: DC

## 2015-02-28 MED ORDER — HYDROCODONE-ACETAMINOPHEN 10-325 MG PO TABS: 1.0000 | ORAL_TABLET | Freq: Four times a day (QID) | ORAL | Status: DC | PRN

## 2015-02-28 MED ORDER — POTASSIUM CHLORIDE CRYS ER 20 MEQ PO TBCR: 20.0000 meq | EXTENDED_RELEASE_TABLET | Freq: Two times a day (BID) | ORAL | Status: DC

## 2015-02-28 NOTE — Progress Notes (Addendum)
Subjective:    Patient ID: Chris Powell, male    DOB: [birth year], 49 y.o.   MRN: [id number]  Pt presents to the office today to establish care. Pt states he had a MI on March 30 this year. He currently doing Rehab and is followed by his Cardiologists.  Hypertension This is a chronic problem. The current episode started more than 1 year ago. The problem has been resolved since onset. The problem is controlled. Pertinent negatives include no anxiety, headaches, palpitations, peripheral edema or shortness of breath. Risk factors for coronary artery disease include dyslipidemia, male gender, obesity, sedentary lifestyle, smoking/tobacco exposure and family history. Past treatments include ACE inhibitors, calcium channel blockers, diuretics, beta blockers and central alpha agonists. The current treatment provides significant improvement. Hypertensive end-organ damage includes CAD/MI and heart failure. There is no history of kidney disease, CVA or a thyroid problem. Identifiable causes of hypertension include sleep apnea.  Hyperlipidemia This is a chronic problem. The current episode started more than 1 year ago. The problem is controlled. Recent lipid tests were reviewed and are normal. He has no history of diabetes or hypothyroidism. Factors aggravating his hyperlipidemia include smoking. Pertinent negatives include no leg pain or shortness of breath. Current antihyperlipidemic treatment includes statins. The current treatment provides significant improvement of lipids. Risk factors for coronary artery disease include dyslipidemia, family history, hypertension, male sex and obesity.  Gastrophageal Reflux He reports no belching, no coughing or no heartburn. This is a chronic problem. The current episode started more than 1 year ago. The problem occurs rarely. The problem has been resolved. He has tried a PPI for the symptoms. The treatment provided significant relief.  Knee Pain  The incident occurred  more than 1 week ago. There was no injury mechanism. The pain is present in the right knee and left knee. The quality of the pain is described as aching. The pain is at a severity of 7/10. The pain is moderate. Pertinent negatives include no loss of motion or muscle weakness. The symptoms are aggravated by weight bearing. He has tried NSAIDs for the symptoms. The treatment provided mild relief.  COPD Pt states he uses his spiriva inhaler daily. Pt states his COPD is well controlled. States he has to use his albuterol inhaler every "once in awhile". PT states he is trying to quit smoking is he is "down to half a pack a day".     Review of Systems  Constitutional: Negative.   HENT: Negative.   Respiratory: Negative.  Negative for cough and shortness of breath.   Cardiovascular: Negative.  Negative for palpitations.  Gastrointestinal: Negative.  Negative for heartburn.  Endocrine: Negative.   Genitourinary: Negative.   Musculoskeletal: Positive for gait problem.  Neurological: Negative.  Negative for headaches.  Hematological: Negative.   Psychiatric/Behavioral: Negative.   All other systems reviewed and are negative.      Objective:   Physical Exam  Constitutional: He is oriented to person, place, and time. He appears well-developed and well-nourished. No distress.  HENT:  Head: Normocephalic.  Right Ear: External ear normal.  Left Ear: External ear normal.  Nose: Nose normal.  Mouth/Throat: Oropharynx is clear and moist.  Eyes: Pupils are equal, round, and reactive to light. Right eye exhibits no discharge. Left eye exhibits no discharge.  Neck: Normal range of motion. Neck supple. No thyromegaly present.  Cardiovascular: Normal rate, regular rhythm, normal heart sounds and intact distal pulses.   No murmur heard. Pulmonary/Chest: Effort normal and breath  sounds normal. No respiratory distress. He has no wheezes.  Abdominal: Soft. Bowel sounds are normal. He exhibits no distension.  There is no tenderness.  Musculoskeletal: Normal range of motion. He exhibits no edema or tenderness.  Neurological: He is alert and oriented to person, place, and time. He has normal reflexes. No cranial nerve deficit.  Skin: Skin is warm and dry. No rash noted. No erythema.  Psychiatric: He has a normal mood and affect. His behavior is normal. Judgment and thought content normal.  Vitals reviewed.     BP 136/81 mmHg  Pulse 54  Temp(Src) 98 F (36.7 C) (Oral)  Ht 5' 7" (1.702 m)  Wt 329 lb (149.233 kg)  BMI 51.52 kg/m2     Assessment & Plan:  1. Essential hypertension - CMP14+EGFR - furosemide (LASIX) 40 MG tablet; Take 1 tablet (40 mg total) by mouth daily.  Dispense: 30 tablet; Refill: 11  2. Acute coronary syndrome - CMP14+EGFR - Lipid panel - furosemide (LASIX) 40 MG tablet; Take 1 tablet (40 mg total) by mouth daily.  Dispense: 30 tablet; Refill: 11  3. Chronic bronchitis, unspecified chronic bronchitis type - CMP14+EGFR  4. Hypokalemia - CMP14+EGFR - potassium chloride SA (K-DUR,KLOR-CON) 20 MEQ tablet; Take 1 tablet (20 mEq total) by mouth 2 (two) times daily.  Dispense: 30 tablet; Refill: 11  5. Gastroesophageal reflux disease, esophagitis presence not specified - CMP14+EGFR  6. Vitamin D deficiency - CMP14+EGFR - Vit D  25 hydroxy (rtn osteoporosis monitoring)  7. Smoking - CMP14+EGFR  8. Morbid obesity-BMI 51 - CMP14+EGFR  9. Hyperlipidemia - CMP14+EGFR - Lipid panel  10. Chronic knee pain, unspecified laterality - CMP14+EGFR - HYDROcodone-acetaminophen (NORCO) 10-325 MG per tablet; Take 1 tablet by mouth every 6 (six) hours as needed.  Dispense: 60 tablet; Refill: 0   Continue all meds Labs pending Health Maintenance reviewed Diet and exercise encouraged RTO 6 month   Evelina Dun, FNP

## 2015-02-28 NOTE — Patient Instructions (Signed)

## 2015-03-01 ENCOUNTER — Encounter (HOSPITAL_COMMUNITY): Payer: Medicaid Other

## 2015-03-01 LAB — CMP14+EGFR
ALT: 20 IU/L (ref 0–44)
AST: 15 IU/L (ref 0–40)
Albumin/Globulin Ratio: 1.3 (ref 1.1–2.5)
Albumin: 3.9 g/dL (ref 3.5–5.5)
Alkaline Phosphatase: 77 IU/L (ref 39–117)
BUN/Creatinine Ratio: 13 (ref 9–20)
BUN: 15 mg/dL (ref 6–24)
Bilirubin Total: 0.2 mg/dL (ref 0.0–1.2)
CALCIUM: 9 mg/dL (ref 8.7–10.2)
CO2: 26 mmol/L (ref 18–29)
Chloride: 100 mmol/L (ref 97–108)
Creatinine, Ser: 1.17 mg/dL (ref 0.76–1.27)
GFR calc Af Amer: 85 mL/min/{1.73_m2} (ref >59)
GFR, EST NON AFRICAN AMERICAN: 73 mL/min/{1.73_m2} (ref >59)
GLOBULIN, TOTAL: 3.1 g/dL (ref 1.5–4.5)
Glucose: 94 mg/dL (ref 65–99)
Potassium: 3.7 mmol/L (ref 3.5–5.2)
Sodium: 142 mmol/L (ref 134–144)
Total Protein: 7 g/dL (ref 6.0–8.5)

## 2015-03-01 LAB — LIPID PANEL
Chol/HDL Ratio: 3.8 ratio units (ref 0.0–5.0)
Cholesterol, Total: 133 mg/dL (ref 100–199)
HDL: 35 mg/dL — ABNORMAL LOW (ref >39)
LDL Calculated: 69 mg/dL (ref 0–99)
Triglycerides: 143 mg/dL (ref 0–149)
VLDL Cholesterol Cal: 29 mg/dL (ref 5–40)

## 2015-03-01 LAB — VITAMIN D 25 HYDROXY (VIT D DEFICIENCY, FRACTURES): Vit D, 25-Hydroxy: 30.4 ng/mL (ref 30.0–100.0)

## 2015-03-03 ENCOUNTER — Encounter (HOSPITAL_COMMUNITY)
Admission: RE | Admit: 2015-03-03 | Discharge: 2015-03-03 | Disposition: A | Discharge status: Home / Self Care | Payer: Medicaid Other | Source: Ambulatory Visit | Attending: Cardiovascular Disease | Admitting: Cardiovascular Disease

## 2015-03-03 DIAGNOSIS — Z955 Presence of coronary angioplasty implant and graft: Secondary | ICD-10-CM | POA: Diagnosis not present

## 2015-03-03 DIAGNOSIS — I214 Non-ST elevation (NSTEMI) myocardial infarction: Secondary | ICD-10-CM | POA: Insufficient documentation

## 2015-03-06 ENCOUNTER — Encounter (HOSPITAL_COMMUNITY)
Admission: RE | Admit: 2015-03-06 | Discharge: 2015-03-06 | Disposition: A | Discharge status: Home / Self Care | Payer: Medicaid Other | Source: Ambulatory Visit | Attending: Cardiovascular Disease | Admitting: Cardiovascular Disease

## 2015-03-06 DIAGNOSIS — I214 Non-ST elevation (NSTEMI) myocardial infarction: Secondary | ICD-10-CM | POA: Diagnosis not present

## 2015-03-08 ENCOUNTER — Encounter (HOSPITAL_COMMUNITY): Payer: Medicaid Other

## 2015-03-10 ENCOUNTER — Encounter (HOSPITAL_COMMUNITY)
Admission: RE | Admit: 2015-03-10 | Discharge: 2015-03-10 | Disposition: A | Discharge status: Home / Self Care | Payer: Medicaid Other | Source: Ambulatory Visit | Attending: Cardiovascular Disease | Admitting: Cardiovascular Disease

## 2015-03-10 DIAGNOSIS — I214 Non-ST elevation (NSTEMI) myocardial infarction: Secondary | ICD-10-CM | POA: Diagnosis not present

## 2015-03-13 ENCOUNTER — Encounter (HOSPITAL_COMMUNITY)
Admission: RE | Admit: 2015-03-13 | Discharge: 2015-03-13 | Disposition: A | Discharge status: Home / Self Care | Payer: Medicaid Other | Source: Ambulatory Visit | Attending: Cardiovascular Disease | Admitting: Cardiovascular Disease

## 2015-03-13 DIAGNOSIS — I214 Non-ST elevation (NSTEMI) myocardial infarction: Secondary | ICD-10-CM | POA: Diagnosis not present

## 2015-03-14 ENCOUNTER — Other Ambulatory Visit: Payer: Self-pay | Admitting: Family

## 2015-03-14 DIAGNOSIS — M25569 Pain in unspecified knee: Secondary | ICD-10-CM

## 2015-03-14 DIAGNOSIS — G8929 Other chronic pain: Secondary | ICD-10-CM

## 2015-03-14 NOTE — Telephone Encounter (Signed)
Pt was given 60 tabs on 02/28/15. Instructions states every 6 hours as needed. Does not mean for pt to take every 6 hours. That rx should last 30 days

## 2015-03-14 NOTE — Telephone Encounter (Signed)
Advised patient of message from [facility], NP - he is requesting a pain clinic referral because he states he needs more than 2 hydrocodone per day to function due to his knee and ankle pain.

## 2015-03-15 ENCOUNTER — Encounter (HOSPITAL_COMMUNITY)
Admission: RE | Admit: 2015-03-15 | Discharge: 2015-03-15 | Disposition: A | Discharge status: Home / Self Care | Payer: Medicaid Other | Source: Ambulatory Visit | Attending: Cardiovascular Disease | Admitting: Cardiovascular Disease

## 2015-03-15 DIAGNOSIS — I214 Non-ST elevation (NSTEMI) myocardial infarction: Secondary | ICD-10-CM | POA: Diagnosis not present

## 2015-03-15 MED ORDER — HYDROCODONE-ACETAMINOPHEN 10-325 MG PO TABS: 1.0000 | ORAL_TABLET | Freq: Four times a day (QID) | ORAL | Status: DC | PRN

## 2015-03-15 NOTE — Telephone Encounter (Signed)
Pain medication refilled until pt can get pain clinic appointment. Pain clinic referral sent.  Rx ready for pick up.

## 2015-03-15 NOTE — Telephone Encounter (Signed)
Patient aware that rx is ready to be picked up and that pain clinic referral has been put in.

## 2015-03-17 ENCOUNTER — Encounter (HOSPITAL_COMMUNITY): Payer: Medicaid Other

## 2015-03-20 ENCOUNTER — Encounter (HOSPITAL_COMMUNITY): Payer: Medicaid Other

## 2015-03-22 ENCOUNTER — Encounter (HOSPITAL_COMMUNITY): Payer: Medicaid Other

## 2015-03-24 ENCOUNTER — Encounter (HOSPITAL_COMMUNITY): Payer: Medicaid Other

## 2015-03-29 ENCOUNTER — Ambulatory Visit: Payer: Medicaid Other | Admitting: Cardiovascular Disease

## 2015-03-29 ENCOUNTER — Encounter (HOSPITAL_COMMUNITY): Payer: Medicaid Other

## 2015-03-31 ENCOUNTER — Encounter (HOSPITAL_COMMUNITY): Payer: Medicaid Other

## 2015-04-03 ENCOUNTER — Encounter (HOSPITAL_COMMUNITY): Payer: Medicaid Other

## 2015-04-05 ENCOUNTER — Encounter (HOSPITAL_COMMUNITY): Payer: Medicaid Other

## 2015-04-07 ENCOUNTER — Encounter (HOSPITAL_COMMUNITY): Payer: Medicaid Other

## 2015-04-10 ENCOUNTER — Encounter (HOSPITAL_COMMUNITY): Payer: Medicaid Other

## 2015-04-12 ENCOUNTER — Encounter (HOSPITAL_COMMUNITY): Payer: Medicaid Other

## 2015-04-12 NOTE — Progress Notes (Signed)
Patient is discharged from [facility] today, April 10, 2015 due to transportation issues. Resources had been given prior to discharge.  Patient finished 13 out of 36 sessions.

## 2015-04-12 NOTE — Addendum Note (Signed)
Encounter addended by: Andrey Cotahristy M Ahliya Glatt, RN on: 04/12/2015  3:33 PM<BR>     Documentation filed: Notes Section

## 2015-04-13 ENCOUNTER — Telehealth: Payer: Self-pay

## 2015-04-13 DIAGNOSIS — G8929 Other chronic pain: Secondary | ICD-10-CM

## 2015-04-13 DIAGNOSIS — M25569 Pain in unspecified knee: Principal | ICD-10-CM

## 2015-04-13 MED ORDER — HYDROCODONE-ACETAMINOPHEN 10-325 MG PO TABS: 1.0000 | ORAL_TABLET | Freq: Four times a day (QID) | ORAL | Status: DC | PRN

## 2015-04-13 NOTE — Telephone Encounter (Signed)
RX ready for pick up 

## 2015-04-13 NOTE — Telephone Encounter (Signed)
Patient awar rx is ready to be picked up

## 2015-04-13 NOTE — Telephone Encounter (Signed)
Pt is out of pain medication; He has been waiting on referral to Pain Management but will not be able to get in to be seen until the end of August opr the first of September. I spoke with the Pain clinic today and that will be the soonest he will be seen. Can you refill his hydrocodone? [facility] [place]

## 2015-04-14 ENCOUNTER — Encounter (HOSPITAL_COMMUNITY): Payer: Medicaid Other

## 2015-04-17 ENCOUNTER — Encounter (HOSPITAL_COMMUNITY): Payer: Medicaid Other

## 2015-04-19 ENCOUNTER — Encounter (HOSPITAL_COMMUNITY): Payer: Medicaid Other

## 2015-04-24 ENCOUNTER — Encounter: Payer: Self-pay | Admitting: *Deleted

## 2015-05-09 ENCOUNTER — Telehealth: Payer: Self-pay

## 2015-05-09 DIAGNOSIS — M25569 Pain in unspecified knee: Principal | ICD-10-CM

## 2015-05-09 DIAGNOSIS — G8929 Other chronic pain: Secondary | ICD-10-CM

## 2015-05-09 MED ORDER — HYDROCODONE-ACETAMINOPHEN 10-325 MG PO TABS: 1.0000 | ORAL_TABLET | Freq: Four times a day (QID) | ORAL | Status: DC | PRN

## 2015-05-09 NOTE — Telephone Encounter (Signed)
Patient aware that rx is ready to be picked up.  

## 2015-05-09 NOTE — Telephone Encounter (Signed)
RX ready for pick up 

## 2015-05-09 NOTE — Telephone Encounter (Signed)
Needs pain meds refilled  Has not been seen by pain management yet

## 2015-05-24 ENCOUNTER — Encounter: Payer: Self-pay | Admitting: *Deleted

## 2015-06-26 ENCOUNTER — Telehealth: Payer: Self-pay | Admitting: Family

## 2015-06-28 NOTE — Telephone Encounter (Signed)
Left message.  We do not have numbers for chiropractors.  You must look them up in phone book or on line.  Physical therapy is usually done through your doctor's referral.  Please call back with more details and we will try to help.

## 2015-07-06 ENCOUNTER — Ambulatory Visit: Payer: Medicaid Other | Attending: Anesthesiology | Admitting: Physical Therapy

## 2015-07-06 DIAGNOSIS — M5442 Lumbago with sciatica, left side: Secondary | ICD-10-CM | POA: Insufficient documentation

## 2015-07-06 DIAGNOSIS — G8929 Other chronic pain: Secondary | ICD-10-CM

## 2015-07-06 NOTE — Therapy (Signed)
[facility] [address] [name], [place], [address] Phone: [phone]   Fax:  [phone]  Physical Therapy Evaluation  Patient Details  Name: Chris HugerMaurice D Plancarte MRN: [id number] Date of Birth: 05/16/1966 Referring Provider:  Tonye RoyaltyGyarteng-Dakwa, Kwadwo,*  Encounter Date: 07/06/2015      PT End of Session - 07/06/15 1012    Visit Number 1   Number of Visits 1   Date for PT Re-Evaluation 07/06/15   PT Start Time 0945   PT Stop Time 1010   PT Time Calculation (min) 25 min   Activity Tolerance Patient tolerated treatment well   Behavior During Therapy [facility] for tasks assessed/performed      Past Medical History  Diagnosis Date  . Hypertension   . COPD (chronic obstructive pulmonary disease)   . Asthma   . Arthritis   . Coronary artery disease   . Sleep apnea     USES CPAP  . Shortness of breath dyspnea   . Pneumonia     HX OF PNA  . Depression   . GERD (gastroesophageal reflux disease)   . Myocardial infarction     Past Surgical History  Procedure Laterality Date  . Knee surgery    . Coronary stent placement  12/22/2014    LAD  . Cardiac catheterization  12/22/2014  . Left heart catheterization with coronary angiogram N/A 12/22/2014    Procedure: LEFT HEART CATHETERIZATION WITH CORONARY ANGIOGRAM;  Surgeon: Corky CraftsJayadeep S Varanasi, MD;  [facility];  Service: Cardiovascular;  Laterality: N/A;    There were no vitals filed for this visit.  Visit Diagnosis:  Chronic left-sided low back pain with left-sided sciatica - Plan: PT plan of care cert/re-cert      Subjective Assessment - 07/06/15 1009    Subjective I hurt all the time.   Limitations Sitting;Lifting;Standing;Walking   How long can you sit comfortably? 5-10 minutes.   How long can you stand comfortably? 5-10 minutes.   How long can you walk comfortably? 5-10 minutes.   Patient Stated Goals I would like to reduce my pain so I can do more.   Currently in Pain?  Yes   Pain Score 8    Pain Location Back   Pain Orientation Left   Pain Descriptors / Indicators Aching   Pain Type Chronic pain   Pain Frequency Constant   Aggravating Factors  "Everyday life."   Pain Relieving Factors Medication.            [facility] PT Assessment - 07/06/15 0001    Assessment   Medical Diagnosis Lumbago.   Onset Date/Surgical Date --  Ongoing.   Precautions   Precautions None   Restrictions   Weight Bearing Restrictions No   Balance Screen   Has the patient fallen in the past 6 months No   Has the patient had a decrease in activity level because of a fear of falling?  Yes   Is the patient reluctant to leave their home because of a fear of falling?  No   Home Environment   Living Environment Private residence   Prior Function   Level of Independence Independent   Posture/Postural Control   Posture Comments Obese abdominal region rssulting in an increase in lumbar lordosis.   ROM / Strength   AROM / PROM / Strength AROM;Strength   AROM   Overall AROM Comments Active lumbar extension is limited to 11 degrees and flexion is decreased by 75%.   Strength   Overall  Strength Comments Norml bilateral lE strength.   Palpation   Palpation comment Very tender to palpation over left lumbar musculature and in region of left SIJ.   Special Tests    Special Tests --  Bil LE hyporeflexia.  (-) bil SLR testing.   Ambulation/Gait   Gait Comments Slow and purpuseful with a wide BOS.                                PT Long Term Goals - 07/06/15 1014    PT LONG TERM GOAL #1   Title Evaluation only.               Plan - 07/06/15 0952    Clinical Impression Statement The patient hss a long h/o low back pain.  Pain rated at 7-8/10.  Pain radiation from left low back down back of leg to foot.  He states that all ADL's increase his pain and his sleep is disturbed by pain.  Medication decrease his pain.   Pt will benefit from skilled  therapeutic intervention in order to improve on the following deficits Pain;Decreased activity tolerance   Rehab Potential Poor   PT Frequency --  Evaluation only.   PT Next Visit Plan Evaluation only.         Problem List Patient Active Problem List   Diagnosis Date Noted  . GERD (gastroesophageal reflux disease) 02/28/2015  . Vitamin D deficiency 02/28/2015  . Hyperlipidemia 02/28/2015  . Chronic knee pain 02/28/2015  . Cocaine abuse 01/25/2015  . NSTEMI (non-ST elevated myocardial infarction) (HCC) 01/24/2015  . CAD - S/P LAD DES 3/11/22/14 12/23/2014  . Morbid obesity-BMI 51 12/23/2014  . COPD (chronic obstructive pulmonary disease) (HCC) 12/23/2014  . HTN (hypertension) 12/23/2014  . Smoking 12/23/2014  . Non-sustained ventricular tachycardia (HCC) 12/23/2014  . Hypokalemia 12/23/2014  . Acute coronary syndrome (HCC) 12/22/2014    Radin Raptis, [place] MPT 07/06/2015, 10:23 AM  [facility] [address] [place], [place], [address] Phone: [phone]   Fax:  [phone]

## 2015-07-07 ENCOUNTER — Encounter (HOSPITAL_COMMUNITY): Payer: Self-pay | Admitting: Emergency Medicine

## 2015-07-07 ENCOUNTER — Emergency Department (HOSPITAL_COMMUNITY)
Admission: EM | Admit: 2015-07-07 | Discharge: 2015-07-07 | Disposition: A | Discharge status: Home / Self Care | Payer: Medicaid Other | Attending: Emergency Medicine | Admitting: Emergency Medicine

## 2015-07-07 ENCOUNTER — Emergency Department (HOSPITAL_COMMUNITY): Payer: Medicaid Other

## 2015-07-07 DIAGNOSIS — K219 Gastro-esophageal reflux disease without esophagitis: Secondary | ICD-10-CM | POA: Diagnosis not present

## 2015-07-07 DIAGNOSIS — Z7982 Long term (current) use of aspirin: Secondary | ICD-10-CM | POA: Diagnosis not present

## 2015-07-07 DIAGNOSIS — Z9861 Coronary angioplasty status: Secondary | ICD-10-CM | POA: Diagnosis not present

## 2015-07-07 DIAGNOSIS — Z7901 Long term (current) use of anticoagulants: Secondary | ICD-10-CM | POA: Insufficient documentation

## 2015-07-07 DIAGNOSIS — Z8659 Personal history of other mental and behavioral disorders: Secondary | ICD-10-CM | POA: Diagnosis not present

## 2015-07-07 DIAGNOSIS — M25531 Pain in right wrist: Secondary | ICD-10-CM | POA: Diagnosis not present

## 2015-07-07 DIAGNOSIS — I2510 Atherosclerotic heart disease of native coronary artery without angina pectoris: Secondary | ICD-10-CM | POA: Insufficient documentation

## 2015-07-07 DIAGNOSIS — Z8701 Personal history of pneumonia (recurrent): Secondary | ICD-10-CM | POA: Insufficient documentation

## 2015-07-07 DIAGNOSIS — Z720 Tobacco use: Secondary | ICD-10-CM | POA: Diagnosis not present

## 2015-07-07 DIAGNOSIS — Z79899 Other long term (current) drug therapy: Secondary | ICD-10-CM | POA: Diagnosis not present

## 2015-07-07 DIAGNOSIS — Z9981 Dependence on supplemental oxygen: Secondary | ICD-10-CM | POA: Insufficient documentation

## 2015-07-07 DIAGNOSIS — G4730 Sleep apnea, unspecified: Secondary | ICD-10-CM | POA: Diagnosis not present

## 2015-07-07 DIAGNOSIS — I10 Essential (primary) hypertension: Secondary | ICD-10-CM | POA: Insufficient documentation

## 2015-07-07 DIAGNOSIS — Z9889 Other specified postprocedural states: Secondary | ICD-10-CM | POA: Diagnosis not present

## 2015-07-07 DIAGNOSIS — M1990 Unspecified osteoarthritis, unspecified site: Secondary | ICD-10-CM | POA: Insufficient documentation

## 2015-07-07 DIAGNOSIS — I252 Old myocardial infarction: Secondary | ICD-10-CM | POA: Diagnosis not present

## 2015-07-07 DIAGNOSIS — J449 Chronic obstructive pulmonary disease, unspecified: Secondary | ICD-10-CM | POA: Diagnosis not present

## 2015-07-07 MED ORDER — HYDROCODONE-ACETAMINOPHEN 5-325 MG PO TABS: 1.0000 | ORAL_TABLET | Freq: Four times a day (QID) | ORAL | Status: DC | PRN

## 2015-07-07 MED ORDER — HYDROCODONE-ACETAMINOPHEN 5-325 MG PO TABS
2.0000 | ORAL_TABLET | Freq: Once | ORAL | Status: AC
  Administered 2015-07-07: 2 via ORAL
  Filled 2015-07-07: qty 2

## 2015-07-07 MED ORDER — IBUPROFEN 600 MG PO TABS: 600.0000 mg | ORAL_TABLET | Freq: Four times a day (QID) | ORAL | Status: DC | PRN

## 2015-07-07 NOTE — ED Notes (Signed)
C/o right wrist pain x 2 months. Started swelling last pm. No known injury. Right wrist appears swollen and slightly red.

## 2015-07-07 NOTE — ED Notes (Signed)
PT IS ON COUMADIN

## 2015-07-07 NOTE — ED Notes (Signed)
PT IN XRAY AT THIS TIME.

## 2015-07-07 NOTE — Discharge Instructions (Signed)
Wrist Pain There are many things that can cause wrist pain. Some common causes include:  An injury to the wrist area, such as a sprain, strain, or fracture.  Overuse of the joint.  A condition that causes increased pressure on a nerve in the wrist (carpal tunnel syndrome).  Wear and tear of the joints that occurs with aging (osteoarthritis).  A variety of other types of arthritis. Sometimes, the cause of wrist pain is not known. The pain often goes away when you follow your health care provider's instructions for relieving pain at home. If your wrist pain continues, tests may need to be done to diagnose your condition. HOME CARE INSTRUCTIONS Pay attention to any changes in your symptoms. Take these actions to help with your pain:  Rest the wrist area for at least 48 hours or as told by your health care provider.  If directed, apply ice to the injured area:  Put ice in a plastic bag.  Place a towel between your skin and the bag.  Leave the ice on for 20 minutes, 2-3 times per day.  Keep your arm raised (elevated) above the level of your heart while you are sitting or lying down.  If a splint or elastic bandage has been applied, use it as told by your health care provider.  Remove the splint or bandage only as told by your health care provider.  Loosen the splint or bandage if your fingers become numb or have a tingling feeling, or if they turn cold or blue.  Take over-the-counter and prescription medicines only as told by your health care provider.  Keep all follow-up visits as told by your health care provider. This is important. SEEK MEDICAL CARE IF:  Your pain is not helped by treatment.  Your pain gets worse. SEEK IMMEDIATE MEDICAL CARE IF:  Your fingers become swollen.  Your fingers turn white, very red, or cold and blue.  Your fingers are numb or have a tingling feeling.  You have difficulty moving your fingers.   This information is not intended to replace  advice given to you by your health care provider. Make sure you discuss any questions you have with your health care provider.   Document Released: 06/19/2005 Document Revised: 05/31/2015 Document Reviewed: 01/25/2015 Elsevier Interactive Patient Education 2016 Elsevier Inc. Osteoarthritis Osteoarthritis is a disease that causes soreness and inflammation of a joint. It occurs when the cartilage at the affected joint wears down. Cartilage acts as a cushion, covering the ends of bones where they meet to form a joint. Osteoarthritis is the most common form of arthritis. It often occurs in older people. The joints affected most often by this condition include those in the:  Ends of the fingers.  Thumbs.  Neck.  Lower back.  Knees.  Hips. CAUSES  Over time, the cartilage that covers the ends of bones begins to wear away. This causes bone to rub on bone, producing pain and stiffness in the affected joints.  RISK FACTORS Certain factors can increase your chances of having osteoarthritis, including:  Older age.  Excessive body weight.  Overuse of joints.  Previous joint injury. SIGNS AND SYMPTOMS   Pain, swelling, and stiffness in the joint.  Over time, the joint may lose its normal shape.  Small deposits of bone (osteophytes) may grow on the edges of the joint.  Bits of bone or cartilage can break off and float inside the joint space. This may cause more pain and damage. DIAGNOSIS  Your health  care provider will do a physical exam and ask about your symptoms. Various tests may be ordered, such as:  X-rays of the affected joint.  Blood tests to rule out other types of arthritis. Additional tests may be used to diagnose your condition. TREATMENT  Goals of treatment are to control pain and improve joint function. Treatment plans may include:  A prescribed exercise program that allows for rest and joint relief.  A weight control plan.  Pain relief techniques, such  as:  Properly applied heat and cold.  Electric pulses delivered to nerve endings under the skin (transcutaneous electrical nerve stimulation [TENS]).  Massage.  Certain nutritional supplements.  Medicines to control pain, such as:  Acetaminophen.  Nonsteroidal anti-inflammatory drugs (NSAIDs), such as naproxen.  Narcotic or central-acting agents, such as tramadol.  Corticosteroids. These can be given orally or as an injection.  Surgery to reposition the bones and relieve pain (osteotomy) or to remove loose pieces of bone and cartilage. Joint replacement may be needed in advanced states of osteoarthritis. HOME CARE INSTRUCTIONS   Take medicines only as directed by your health care provider.  Maintain a healthy weight. Follow your health care provider's instructions for weight control. This may include dietary instructions.  Exercise as directed. Your health care provider can recommend specific types of exercise. These may include:  Strengthening exercises. These are done to strengthen the muscles that support joints affected by arthritis. They can be performed with weights or with exercise bands to add resistance.  Aerobic activities. These are exercises, such as brisk walking or low-impact aerobics, that get your heart pumping.  Range-of-motion activities. These keep your joints limber.  Balance and agility exercises. These help you maintain daily living skills.  Rest your affected joints as directed by your health care provider.  Keep all follow-up visits as directed by your health care provider. SEEK MEDICAL CARE IF:   Your skin turns red.  You develop a rash in addition to your joint pain.  You have worsening joint pain.  You have a fever along with joint or muscle aches. SEEK IMMEDIATE MEDICAL CARE IF:  You have a significant loss of weight or appetite.  You have night sweats. FOR MORE INFORMATION   National Institute of Arthritis and Musculoskeletal and  Skin Diseases: www.niams.http://www.myers.net/  [employer] on Aging: https://walker.com/  American College of Rheumatology: www.rheumatology.org   This information is not intended to replace advice given to you by your health care provider. Make sure you discuss any questions you have with your health care provider.   Document Released: 09/09/2005 Document Revised: 09/30/2014 Document Reviewed: 05/17/2013 Elsevier Interactive Patient Education [employer]

## 2015-07-07 NOTE — ED Provider Notes (Signed)
CSN: [id number]     Arrival date & time 07/07/15  1346 History  By signing my name below, I, Chris Powell, attest that this documentation has been prepared under the direction and in the presence of [name], PA-C Electronically Signed: Soijett Powell, ED Scribe. 07/07/2015. 2:30 PM.  Chief Complaint  Patient presents with  . Wrist Pain      The history is provided by the patient. No language interpreter was used.    Chris Powell is a 49 y.o. male with a medical hx of HTN and arthritis, who presents to the Emergency Department complaining of right wrist pain onset 1 months. He reports that he was laying some brick and that his right wrist began to swell last night and that is new for him. He states that it feels like there is a knot to the inside of his wrist. He denies ever having a medical hx of gout or trauma/injury to the area. Pt is having associated symptoms of joint swelling. He notes that he has not tried any medications for the relief of his symptoms. He denies color change, wound, rash, and any other symptoms. He reports that he is on coumadin at this time for his stents. He notes that he is borderline diabetic, but he has not been dx. Denies allergies to medications.    Past Medical History  Diagnosis Date  . Hypertension   . COPD (chronic obstructive pulmonary disease) (HCC)   . Asthma   . Arthritis   . Coronary artery disease   . Sleep apnea     USES CPAP  . Shortness of breath dyspnea   . Pneumonia     HX OF PNA  . Depression   . GERD (gastroesophageal reflux disease)   . Myocardial infarction [facility])    Past Surgical History  Procedure Laterality Date  . Knee surgery    . Coronary stent placement  12/22/2014    LAD  . Cardiac catheterization  12/22/2014  . Left heart catheterization with coronary angiogram N/A 12/22/2014    Procedure: LEFT HEART CATHETERIZATION WITH CORONARY ANGIOGRAM;  Surgeon: Corky Crafts, MD;  Location: [facility] CATH LAB;  Service:  Cardiovascular;  Laterality: N/A;   Family History  Problem Relation Age of Onset  . Heart attack Father   . Cancer Mother    Social History  Substance Use Topics  . Smoking status: Current Every Day Smoker -- 0.50 packs/day for 30 years    Types: Cigarettes  . Smokeless tobacco: Never Used     Comment: " I have  tried vapor cigarettes"  . Alcohol Use: Yes     Comment: occ.    Review of Systems  Constitutional: Negative for fever and chills.  Respiratory: Negative for shortness of breath.   Cardiovascular: Negative for chest pain.  Gastrointestinal: Negative for nausea, vomiting, diarrhea and constipation.  Genitourinary: Negative for dysuria.  Musculoskeletal: Positive for joint swelling and arthralgias.  Skin: Negative for color change, rash and wound.      Allergies  Review of patient's allergies indicates no known allergies.  Home Medications   Prior to Admission medications   Medication Sig Start Date End Date Taking? Authorizing Provider  acetaminophen (TYLENOL) 325 MG tablet Take 2 tablets (650 mg total) by mouth every 4 (four) hours as needed for headache or mild pain. Patient not taking: Reported on 02/28/2015 12/23/14   Abelino Derrick, PA-C  amLODipine (NORVASC) 5 MG tablet Take 1 tablet (5 mg total) by mouth  daily. 01/25/15   Abelino DerrickLuke K Kilroy, PA-C  aspirin 81 MG chewable tablet Chew 1 tablet (81 mg total) by mouth daily. 12/23/14   Abelino DerrickLuke K Kilroy, PA-C  atorvastatin (LIPITOR) 80 MG tablet Take 1 tablet (80 mg total) by mouth daily at 6 PM. Patient taking differently: Take 40 mg by mouth daily at 6 PM.  12/23/14   Abelino DerrickLuke K Kilroy, PA-C  Cholecalciferol 2000 UNITS CAPS Take by mouth.    Historical Provider, MD  cloNIDine (CATAPRES) 0.3 MG tablet Take 0.3 mg by mouth 3 (three) times daily.    Historical Provider, MD  furosemide (LASIX) 40 MG tablet Take 1 tablet (40 mg total) by mouth daily. 02/28/15   Junie Spencerhristy A Hawks, FNP  hydrALAZINE (APRESOLINE) 25 MG tablet Take 1 tablet (25 mg  total) by mouth 3 (three) times daily. 01/16/15   Dyann KiefMichele M Lenze, PA-C  hydrALAZINE (APRESOLINE) 50 MG tablet Take 50 mg by mouth 3 (three) times daily.    Historical Provider, MD  HYDROcodone-acetaminophen (NORCO) 10-325 MG per tablet Take 1 tablet by mouth every 6 (six) hours as needed. 05/09/15   Junie Spencerhristy A Hawks, FNP  ibuprofen (ADVIL,MOTRIN) 800 MG tablet Take 0.5 tablets (400 mg total) by mouth every 8 (eight) hours as needed. 01/25/15   Abelino DerrickLuke K Kilroy, PA-C  lisinopril (PRINIVIL,ZESTRIL) 40 MG tablet Take 2 tablets (80 mg total) by mouth daily. 12/23/14   Abelino DerrickLuke K Kilroy, PA-C  methocarbamol (ROBAXIN) 750 MG tablet Take 750 mg by mouth every 8 (eight) hours as needed for muscle spasms.    Historical Provider, MD  metoprolol tartrate (LOPRESSOR) 25 MG tablet Take 25 mg by mouth 2 (two) times daily.    Historical Provider, MD  nicotine (NICODERM CQ - DOSED IN MG/24 HOURS) 14 mg/24hr patch Place 14 mg onto the skin daily.    Historical Provider, MD  nitroGLYCERIN (NITROSTAT) 0.4 MG SL tablet Place 1 tablet (0.4 mg total) under the tongue every 5 (five) minutes as needed for chest pain. Patient not taking: Reported on 07/06/2015 01/27/15   Abelino DerrickLuke K Kilroy, PA-C  omeprazole (PRILOSEC) 20 MG capsule Take 20 mg by mouth daily.    Historical Provider, MD  potassium chloride SA (K-DUR,KLOR-CON) 20 MEQ tablet Take 1 tablet (20 mEq total) by mouth 2 (two) times daily. 02/28/15   Junie Spencerhristy A Hawks, FNP  prasugrel (EFFIENT) 10 MG TABS tablet Take 1 tablet (10 mg total) by mouth daily. 12/23/14   Abelino DerrickLuke K Kilroy, PA-C  tiotropium (SPIRIVA) 18 MCG inhalation capsule Place 18 mcg into inhaler and inhale daily as needed (Pt states he does not use daily but uses as he feels he needs it.).    Historical Provider, MD   BP 179/99 mmHg  Pulse 75  Temp(Src) 98.6 F (37 C) (Oral)  Resp 20  Ht 5\' 7"  (1.702 m)  Wt 330 lb (149.687 kg)  BMI 51.67 kg/m2  SpO2 98% Physical Exam  Constitutional: He is oriented to person, place, and  time. He appears well-developed and well-nourished. No distress.  HENT:  Head: Normocephalic and atraumatic.  Eyes: EOM are normal.  Neck: Neck supple.  Cardiovascular: Normal rate and intact distal pulses.   Intact distal pulses. Brisk cap refill.   Pulmonary/Chest: Effort normal. No respiratory distress.  Musculoskeletal: Normal range of motion.  Right wrist: moderately swollen and TTP. No bony abnormality or deformity. ROM and strength 4/5 limited by pain. No erythema or excessive warmth. Doubt septic joint.   Neurological: He is alert and  oriented to person, place, and time. No sensory deficit.  Sensation intact.  Skin: Skin is warm and dry.  Psychiatric: He has a normal mood and affect. His behavior is normal.  Nursing note and vitals reviewed.   ED Course  Procedures (including critical care time) DIAGNOSTIC STUDIES: Oxygen Saturation is 98% on RA, nl by my interpretation.    COORDINATION OF CARE: 2:28 PM Discussed treatment plan with pt at bedside which includes right wrist xray, right hand xray, and norco and pt agreed to plan.    Labs Review Labs Reviewed - No data to display  Imaging Review Dg Wrist Complete Right  07/07/2015  CLINICAL DATA:  Wrist and hand pain with progression. EXAM: RIGHT WRIST - COMPLETE 3+ VIEW COMPARISON:  None. FINDINGS: Mild degenerative changes are present at the first [facility] joint. No acute bone or soft tissue abnormality is evident. There is no evidence for healing fracture. The distal radius and ulna are intact. IMPRESSION: 1. Minimal degenerative change. 2. No acute abnormality. Electronically Signed   By: Marin Roberts M.D.   On: 07/07/2015 14:56   Dg Hand Complete Right  07/07/2015  CLINICAL DATA:  Wrist and hand pain, soft tissue swelling. No known injury. EXAM: RIGHT HAND - COMPLETE 3+ VIEW COMPARISON:  None. FINDINGS: Mild degenerative changes in the DIP joints and first carpometacarpal joint. No bony erosions. Remainder the joint  spaces are maintained. No acute bony abnormality. Specifically, no fracture, subluxation, or dislocation. Soft tissues are intact. IMPRESSION: Mild osteoarthritic changes as above.  No acute bony abnormality. Electronically Signed   By: Charlett Nose M.D.   On: 07/07/2015 14:57   I have personally reviewed and evaluated these images as part of my medical decision-making.   MDM   Final diagnoses:  Wrist pain, acute, right    Patient with right wrist pain. Plain films are negative save for osteoarthritic changes. I suspect the patient's symptoms have been exacerbated by laying brick yesterday. He denies any injuries or bug bites. His wrist is not red or hot. It is mildly swollen. Patient is able to range his wrist without great difficulty. I highly doubt septic wrist. We had a thorough discussion regarding signs and symptoms of septic arthritis. At this time, patient would like to follow-up with a hand specialist, rather than undergo further blood work in the emergency department. I feel this is appropriate at this time, as he has no evidence of septic joint. Will recommend follow-up with hand surgery. Again I feel that his symptoms are most likely consistent with overuse injury from laying brick yesterday. I will give the patient a wrist splint, treat his pain, and discharge patient home. Return precautions have been discussed. Patient is stable and ready for discharge.  I, Kyrra Prada, personally performed the services described in this documentation. All medical record entries made by the scribe were at my direction and in my presence.  I have reviewed the chart and discharge instructions and agree that the record reflects my personal performance and is accurate and complete. Denarius Sesler.  07/07/2015. 3:14 PM.     [name], PA-C 07/07/15 1514  Gerhard Munch, MD 07/08/15 1757

## 2015-07-13 ENCOUNTER — Ambulatory Visit (INDEPENDENT_AMBULATORY_CARE_PROVIDER_SITE_OTHER): Payer: Medicaid Other | Admitting: Family Medicine

## 2015-07-13 ENCOUNTER — Ambulatory Visit (INDEPENDENT_AMBULATORY_CARE_PROVIDER_SITE_OTHER): Payer: Medicaid Other

## 2015-07-13 ENCOUNTER — Encounter: Payer: Self-pay | Admitting: Family Medicine

## 2015-07-13 VITALS — BP 148/96 | HR 69 | Temp 98.4°F | Ht 67.0 in | Wt 341.2 lb

## 2015-07-13 DIAGNOSIS — M25531 Pain in right wrist: Secondary | ICD-10-CM

## 2015-07-13 MED ORDER — HYDROCODONE-ACETAMINOPHEN 5-325 MG PO TABS: 1.0000 | ORAL_TABLET | Freq: Four times a day (QID) | ORAL | Status: DC | PRN

## 2015-07-13 NOTE — Progress Notes (Signed)
   HPI  Patient presents today here to follow-up from an ER visit for right wrist pain.  Patient was seen 6 days ago in the ER after laying brick and applying stucco with right wrist pain.  He states that the pain has worsened since he was seen. He seems to have multiple swollen area that's very tender. He has pain with moving his fingers, flexing his fingers, or extending his fingers. He can use his hand completely by he has lots of pain with any use.  He's been using a wrist splint to protect his hand. Denies fever, chills, sweats  PMH: Smoking status noted ROS: Per HPI  Objective: BP 148/96 mmHg  Pulse 69  Temp(Src) 98.4 F (36.9 C) (Oral)  Ht 5\' 7"  (1.702 m)  Wt 341 lb 3.2 oz (154.767 kg)  BMI 53.43 kg/m2 Gen: NAD, alert, cooperative with exam HEENT: NCAT CV: RRR, good S1/S2, no murmur Resp: CTABL, no wheezes, non-labored Ext: R Wrist and proximal hand pain with palpation with tender swelling around the area of th proximal second metacarpal or scaphoid, tenderness with flex/ext of fingers and hand but worst is extension at wrist Neuro: Alert and oriented, No gross deficits  Assessment and plan:  # Wrist pain X-rays today with no acute fracture that I can appreciate With his area of tenderness I am concerned for scaphoid fracture, placed in a thumb spica splint and referred to hand surgery Short course of narcotics given for pain relief, I explained very clearly that this is the only prescription to be given for this acute illness, he is pursuing placement in a pain management clinic for chronic knee and back pain  Orders Placed This Encounter  Procedures  . DG Hand Complete Right    Standing Status: Future     Number of Occurrences:      Standing Expiration Date: 09/11/2016    Order Specific Question:  Reason for Exam (SYMPTOM  OR DIAGNOSIS REQUIRED)    Answer:  R proximal hand pain, over first metacarpal    Order Specific Question:  Preferred imaging location?   Answer:  Internal     Murtis SinkSam Bradshaw, MD [facility] 07/13/2015, 11:22 AM

## 2015-07-13 NOTE — Patient Instructions (Signed)
Great to meet you!  We will arrange the visit with the hand surgeon  Wear the wrist splint until you see him, you may take it off to bathe

## 2015-07-17 ENCOUNTER — Other Ambulatory Visit: Payer: Self-pay | Admitting: Orthopedic Surgery

## 2015-07-20 ENCOUNTER — Encounter (HOSPITAL_BASED_OUTPATIENT_CLINIC_OR_DEPARTMENT_OTHER): Payer: Self-pay | Admitting: *Deleted

## 2015-07-20 NOTE — Pre-Procedure Instructions (Signed)
Spoke with Huntley DecSara at Dr. Dionicio StallKevin Kuzma's office. She will f/u with pt and make sure he has clear direction regarding Effient and ASA use.

## 2015-07-20 NOTE — Pre-Procedure Instructions (Signed)
Reviewed pt's pmh, meds and BMI with Dr. Ivin Bootyrews. Pt to come in to PAT for labs. Will get anesthesia consult at that time.  Pt to call pcp and get clarify use of blood thinner and asa today for surgery 07/27/15.

## 2015-07-21 ENCOUNTER — Telehealth: Payer: Self-pay | Admitting: Cardiovascular Disease

## 2015-07-21 NOTE — Telephone Encounter (Signed)
Maralyn SagoSarah is calling to find out if he can stop his Effient 5 days prior to his surgery November 3rd . Please call    Thanks

## 2015-07-21 NOTE — Telephone Encounter (Signed)
error 

## 2015-07-21 NOTE — Telephone Encounter (Signed)
Spoke to Chris Powell Patient schedule to have cyst removed his hand. Would like to stop effient on Sunday.

## 2015-07-21 NOTE — Telephone Encounter (Signed)
Reviewed with Dr Royann Shiversroitoru,  No can not stop EFFIENT-- until march 2017 ,  Drug -eluding stent in 12/22/14  [facility]

## 2015-07-27 ENCOUNTER — Ambulatory Visit (HOSPITAL_BASED_OUTPATIENT_CLINIC_OR_DEPARTMENT_OTHER): Admission: RE | Admit: 2015-07-27 | Payer: Medicaid Other | Source: Ambulatory Visit | Admitting: Orthopedic Surgery

## 2015-07-27 SURGERY — EXCISION, GANGLION CYST, WRIST
Anesthesia: Choice | Site: Wrist | Laterality: Right

## 2015-08-04 ENCOUNTER — Telehealth: Payer: Self-pay | Admitting: Family

## 2015-08-04 NOTE — Telephone Encounter (Signed)
Stp and he has seen ortho for his wrist and they moved surgery back due to cardiologist stating he needs to stay on his blood thinners for a year. Pt is c/o increased pain and swelling in the wrist. Advised pt to call ortho to see if they could give him something or recommend some type of treatment for his wrist. Pt voiced understanding.

## 2015-08-09 DIAGNOSIS — M67431 Ganglion, right wrist: Secondary | ICD-10-CM | POA: Insufficient documentation

## 2015-08-09 DIAGNOSIS — M24231 Disorder of ligament, right wrist: Secondary | ICD-10-CM | POA: Insufficient documentation

## 2015-08-09 DIAGNOSIS — M19041 Primary osteoarthritis, right hand: Secondary | ICD-10-CM | POA: Insufficient documentation

## 2015-08-15 ENCOUNTER — Ambulatory Visit (INDEPENDENT_AMBULATORY_CARE_PROVIDER_SITE_OTHER): Payer: Medicaid Other | Admitting: Family Medicine

## 2015-08-15 ENCOUNTER — Encounter: Payer: Self-pay | Admitting: Family Medicine

## 2015-08-15 VITALS — BP 158/86 | HR 81 | Temp 97.0°F | Ht 67.0 in | Wt 344.0 lb

## 2015-08-15 DIAGNOSIS — G8929 Other chronic pain: Secondary | ICD-10-CM | POA: Diagnosis not present

## 2015-08-15 DIAGNOSIS — R109 Unspecified abdominal pain: Secondary | ICD-10-CM | POA: Diagnosis not present

## 2015-08-15 DIAGNOSIS — M25569 Pain in unspecified knee: Secondary | ICD-10-CM

## 2015-08-15 DIAGNOSIS — Z23 Encounter for immunization: Secondary | ICD-10-CM | POA: Diagnosis not present

## 2015-08-15 NOTE — Progress Notes (Signed)
   HPI  Patient presents today here for evaluation of abdominal pain.  Our last visit I gave him a small amount of Norco, he states that he took that prescription to this pain clinic and get them to shredded. He explains that he neglected to tell me that he has the pain clinic last time but did not fill that prescription. He had a cyst on his right wrist which she could not get it removed due to not being able to stop his blood thinners as he has a recent stent placement.  He's here with abdominal pain He states that he's had a similar abdominal pain for about 4 months which improved completely within started back up about 2 days ago. Started after he was reaching over his head lifting a heavy pressure washer with his left hand. He describes left sided abdominal wall pain worse with movement, also worse with lying down and trying to roll over in bed. He denies any diarrhea, fever, malaise, history of diverticulitis, oral intolerance, or difficulty breathing. He is on chronic narcotics from his pain clinic, [facility], he would like to change pain clinics due to the distance and difficulty working with this clinic. He does occasionally have radiation of the left lower abdominal wall pain into his left testicle.   PMH: Smoking status noted ROS: Per HPI  Objective: BP 158/86 mmHg  Pulse 81  Temp(Src) 97 F (36.1 C) (Oral)  Ht 5\' 7"  (1.702 m)  Wt 344 lb (156.037 kg)  BMI 53.87 kg/m2 Gen: NAD, alert, cooperative with exam HEENT: NCAT CV: RRR, good S1/S2, no murmur Resp: CTABL, no wheezes, non-labored Abd: Soft, tenderness to palpation of his left lower quadrant, no groin pain, no tenderness to palpation in the other quadrants, no guarding Ext: No edema, warm Neuro: Alert and oriented, No gross deficits  Assessment and plan:  # Left-sided abdominal wall pain Likely due to muscle strain Recommended rest, his usual pain medications Discussed red flags for seeking emergent care  or return to the clinic.   # Chronic knee pain He would like to change pain clinics, regular referral for him I also signed a form stating that he is informed me of his pain contract and that I do not plan to continue giving him narcotics. I reviewed the state database and he did not fill the prescription that I gave him.    Orders Placed This Encounter  Procedures  . Flu Vaccine QUAD 36+ mos IM  . Ambulatory referral to Pain Clinic    Referral Priority:  Routine    Referral Type:  Consultation    Referral Reason:  Specialty Services Required    Requested Specialty:  Pain Medicine    Number of Visits Requested:  1    No orders of the defined types were placed in this encounter.    Murtis SinkSam Janai Maudlin, MD [facility] 08/15/2015, 10:01 AM

## 2015-08-15 NOTE — Patient Instructions (Signed)
Great to see you!  Keep your shoulder moving,.   Abdominal Pain, Adult Many things can cause abdominal pain. Usually, abdominal pain is not caused by a disease and will improve without treatment. It can often be observed and treated at home. Your health care provider will do a physical exam and possibly order blood tests and X-rays to help determine the seriousness of your pain. However, in many cases, more time must pass before a clear cause of the pain can be found. Before that point, your health care provider may not know if you need more testing or further treatment. HOME CARE INSTRUCTIONS Monitor your abdominal pain for any changes. The following actions may help to alleviate any discomfort you are experiencing:  Only take over-the-counter or prescription medicines as directed by your health care provider.  Do not take laxatives unless directed to do so by your health care provider.  Try a clear liquid diet (broth, tea, or water) as directed by your health care provider. Slowly move to a bland diet as tolerated. SEEK MEDICAL CARE IF:  You have unexplained abdominal pain.  You have abdominal pain associated with nausea or diarrhea.  You have pain when you urinate or have a bowel movement.  You experience abdominal pain that wakes you in the night.  You have abdominal pain that is worsened or improved by eating food.  You have abdominal pain that is worsened with eating fatty foods.  You have a fever. SEEK IMMEDIATE MEDICAL CARE IF:  Your pain does not go away within 2 hours.  You keep throwing up (vomiting).  Your pain is felt only in portions of the abdomen, such as the right side or the left lower portion of the abdomen.  You pass bloody or black tarry stools. MAKE SURE YOU:  Understand these instructions.  Will watch your condition.  Will get help right away if you are not doing well or get worse.   This information is not intended to replace advice given to you  by your health care provider. Make sure you discuss any questions you have with your health care provider.   Document Released: 06/19/2005 Document Revised: 05/31/2015 Document Reviewed: 05/19/2013 Elsevier Interactive Patient Education [employer] Elsevier Inc.

## 2015-08-21 ENCOUNTER — Telehealth: Payer: Self-pay

## 2015-08-21 ENCOUNTER — Telehealth: Payer: Self-pay | Admitting: Family Medicine

## 2015-08-21 NOTE — Telephone Encounter (Signed)
Note made in error

## 2015-08-21 NOTE — Telephone Encounter (Signed)
Patient aware.

## 2015-08-21 NOTE — Telephone Encounter (Signed)
Called in and asked about referral to Pain Clinc- Dr Weldon Inchestoole  Told him that he needed to get the records from [facility] and he said she was there and would get them.  Called back and said they made him sign a paper and said it could take up to 30 days to get them.  Wants to know if you will supply his pain meds inbetween him getting in to Dr Caro Larochetoole's office   ( Note- Dr Weldon Inchestoole declines seeing patients a lot of the time)

## 2015-08-21 NOTE — Telephone Encounter (Signed)
I would recommend chronic pain meds being managed by pain management as there is no guarantee that the new clinic will except him.   Murtis SinkSam Bradshaw, MD [facility] 08/21/2015, 12:51 PM

## 2015-08-22 ENCOUNTER — Telehealth: Payer: Self-pay | Admitting: Family Medicine

## 2015-08-22 MED ORDER — IBUPROFEN 800 MG PO TABS
800.0000 mg | ORAL_TABLET | Freq: Three times a day (TID) | ORAL | Status: DC | PRN
Start: 2015-08-22 — End: 2015-09-12

## 2015-08-22 NOTE — Telephone Encounter (Signed)
Patient aware.

## 2015-08-22 NOTE — Telephone Encounter (Signed)
Refilled 800 mg motrin, patient seems to be dealing with difficulty with pain clinic.   Murtis SinkSam Bradshaw, MD [facility] 08/22/2015, 11:17 AM

## 2015-08-22 NOTE — Telephone Encounter (Signed)
Patient would like Ibuprofen 800 mg [facility]

## 2015-08-29 ENCOUNTER — Encounter: Payer: Self-pay | Admitting: Family

## 2015-08-29 ENCOUNTER — Ambulatory Visit (INDEPENDENT_AMBULATORY_CARE_PROVIDER_SITE_OTHER): Payer: Medicaid Other | Admitting: Family

## 2015-08-29 VITALS — BP 142/91 | HR 70 | Temp 97.4°F | Ht 67.0 in | Wt 340.6 lb

## 2015-08-29 DIAGNOSIS — IMO0001 Reserved for inherently not codable concepts without codable children: Secondary | ICD-10-CM

## 2015-08-29 DIAGNOSIS — J42 Unspecified chronic bronchitis: Secondary | ICD-10-CM

## 2015-08-29 DIAGNOSIS — K219 Gastro-esophageal reflux disease without esophagitis: Secondary | ICD-10-CM

## 2015-08-29 DIAGNOSIS — I249 Acute ischemic heart disease, unspecified: Secondary | ICD-10-CM

## 2015-08-29 DIAGNOSIS — E559 Vitamin D deficiency, unspecified: Secondary | ICD-10-CM

## 2015-08-29 DIAGNOSIS — E876 Hypokalemia: Secondary | ICD-10-CM

## 2015-08-29 DIAGNOSIS — E8881 Metabolic syndrome: Secondary | ICD-10-CM

## 2015-08-29 DIAGNOSIS — E785 Hyperlipidemia, unspecified: Secondary | ICD-10-CM | POA: Diagnosis not present

## 2015-08-29 DIAGNOSIS — I10 Essential (primary) hypertension: Secondary | ICD-10-CM

## 2015-08-29 DIAGNOSIS — I214 Non-ST elevation (NSTEMI) myocardial infarction: Secondary | ICD-10-CM

## 2015-08-29 DIAGNOSIS — Z720 Tobacco use: Secondary | ICD-10-CM

## 2015-08-29 DIAGNOSIS — F17200 Nicotine dependence, unspecified, uncomplicated: Secondary | ICD-10-CM

## 2015-08-29 MED ORDER — PRASUGREL HCL 10 MG PO TABS: 10.0000 mg | ORAL_TABLET | Freq: Every day | ORAL | Status: DC

## 2015-08-29 MED ORDER — METOPROLOL SUCCINATE ER 50 MG PO TB24: 50.0000 mg | ORAL_TABLET | Freq: Every day | ORAL | Status: DC

## 2015-08-29 NOTE — Patient Instructions (Signed)
Hypertension Hypertension, commonly called high blood pressure, is when the force of blood pumping through your arteries is too strong. Your arteries are the blood vessels that carry blood from your heart throughout your body. A blood pressure reading consists of a higher number over a lower number, such as 110/72. The higher number (systolic) is the pressure inside your arteries when your heart pumps. The lower number (diastolic) is the pressure inside your arteries when your heart relaxes. Ideally you want your blood pressure below 120/80. Hypertension forces your heart to work harder to pump blood. Your arteries may become narrow or stiff. Having untreated or uncontrolled hypertension can cause heart attack, stroke, kidney disease, and other problems. RISK FACTORS Some risk factors for high blood pressure are controllable. Others are not.  Risk factors you cannot control include:   Race. You may be at higher risk if you are African American.  Age. Risk increases with age.  Gender. Men are at higher risk than women before age 45 years. After age 65, women are at higher risk than men. Risk factors you can control include:  Not getting enough exercise or physical activity.  Being overweight.  Getting too much fat, sugar, calories, or salt in your diet.  Drinking too much alcohol. SIGNS AND SYMPTOMS Hypertension does not usually cause signs or symptoms. Extremely high blood pressure (hypertensive crisis) may cause headache, anxiety, shortness of breath, and nosebleed. DIAGNOSIS To check if you have hypertension, your health care provider will measure your blood pressure while you are seated, with your arm held at the level of your heart. It should be measured at least twice using the same arm. Certain conditions can cause a difference in blood pressure between your right and left arms. A blood pressure reading that is higher than normal on one occasion does not mean that you need treatment. If  it is not clear whether you have high blood pressure, you may be asked to return on a different day to have your blood pressure checked again. Or, you may be asked to monitor your blood pressure at home for 1 or more weeks. TREATMENT Treating high blood pressure includes making lifestyle changes and possibly taking medicine. Living a healthy lifestyle can help lower high blood pressure. You may need to change some of your habits. Lifestyle changes may include:  Following the DASH diet. This diet is high in fruits, vegetables, and whole grains. It is low in salt, red meat, and added sugars.  Keep your sodium intake below 2,300 mg per day.  Getting at least 30-45 minutes of aerobic exercise at least 4 times per week.  Losing weight if necessary.  Not smoking.  Limiting alcoholic beverages.  Learning ways to reduce stress. Your health care provider may prescribe medicine if lifestyle changes are not enough to get your blood pressure under control, and if one of the following is true:  You are 18-59 years of age and your systolic blood pressure is above 140.  You are 60 years of age or older, and your systolic blood pressure is above 150.  Your diastolic blood pressure is above 90.  You have diabetes, and your systolic blood pressure is over 140 or your diastolic blood pressure is over 90.  You have kidney disease and your blood pressure is above 140/90.  You have heart disease and your blood pressure is above 140/90. Your personal target blood pressure may vary depending on your medical conditions, your age, and other factors. HOME CARE INSTRUCTIONS    Have your blood pressure rechecked as directed by your health care provider.   Take medicines only as directed by your health care provider. Follow the directions carefully. Blood pressure medicines must be taken as prescribed. The medicine does not work as well when you skip doses. Skipping doses also puts you at risk for  problems.  Do not smoke.   Monitor your blood pressure at home as directed by your health care provider. SEEK MEDICAL CARE IF:   You think you are having a reaction to medicines taken.  You have recurrent headaches or feel dizzy.  You have swelling in your ankles.  You have trouble with your vision. SEEK IMMEDIATE MEDICAL CARE IF:  You develop a severe headache or confusion.  You have unusual weakness, numbness, or feel faint.  You have severe chest or abdominal pain.  You vomit repeatedly.  You have trouble breathing. MAKE SURE YOU:   Understand these instructions.  Will watch your condition.  Will get help right away if you are not doing well or get worse.   This information is not intended to replace advice given to you by your health care provider. Make sure you discuss any questions you have with your health care provider.   Document Released: 09/09/2005 Document Revised: 01/24/2015 Document Reviewed: 07/02/2013 Elsevier Interactive Patient Education 2016 Elsevier Inc. Insomnia Insomnia is a sleep disorder that makes it difficult to fall asleep or to stay asleep. Insomnia can cause tiredness (fatigue), low energy, difficulty concentrating, mood swings, and poor performance at work or school.  There are three different ways to classify insomnia:  Difficulty falling asleep.  Difficulty staying asleep.  Waking up too early in the morning. Any type of insomnia can be long-term (chronic) or short-term (acute). Both are common. Short-term insomnia usually lasts for three months or less. Chronic insomnia occurs at least three times a week for longer than three months. CAUSES  Insomnia may be caused by another condition, situation, or substance, such as:  Anxiety.  Certain medicines.  Gastroesophageal reflux disease (GERD) or other gastrointestinal conditions.  Asthma or other breathing conditions.  Restless legs syndrome, sleep apnea, or other sleep  disorders.  Chronic pain.  Menopause. This may include hot flashes.  Stroke.  Abuse of alcohol, tobacco, or illegal drugs.  Depression.  Caffeine.   Neurological disorders, such as Alzheimer disease.  An overactive thyroid (hyperthyroidism). The cause of insomnia may not be known. RISK FACTORS Risk factors for insomnia include:  Gender. Women are more commonly affected than men.  Age. Insomnia is more common as you get older.  Stress. This may involve your professional or personal life.  Income. Insomnia is more common in people with lower income.  Lack of exercise.   Irregular work schedule or night shifts.  Traveling between different time zones. SIGNS AND SYMPTOMS If you have insomnia, trouble falling asleep or trouble staying asleep is the main symptom. This may lead to other symptoms, such as:  Feeling fatigued.  Feeling nervous about going to sleep.  Not feeling rested in the morning.  Having trouble concentrating.  Feeling irritable, anxious, or depressed. TREATMENT  Treatment for insomnia depends on the cause. If your insomnia is caused by an underlying condition, treatment will focus on addressing the condition. Treatment may also include:   Medicines to help you sleep.  Counseling or therapy.  Lifestyle adjustments. HOME CARE INSTRUCTIONS   Take medicines only as directed by your health care provider.  Keep regular sleeping and waking hours.   Avoid naps.  Keep a sleep diary to help you and your health care provider figure out what could be causing your insomnia. Include:   When you sleep.  When you wake up during the night.  How well you sleep.   How rested you feel the next day.  Any side effects of medicines you are taking.  What you eat and drink.   Make your bedroom a comfortable place where it is easy to fall asleep:  Put up shades or special blackout curtains to block light from outside.  Use a white noise machine to  block noise.  Keep the temperature cool.   Exercise regularly as directed by your health care provider. Avoid exercising right before bedtime.  Use relaxation techniques to manage stress. Ask your health care provider to suggest some techniques that may work well for you. These may include:  Breathing exercises.  Routines to release muscle tension.  Visualizing peaceful scenes.  Cut back on alcohol, caffeinated beverages, and cigarettes, especially close to bedtime. These can disrupt your sleep.  Do not overeat or eat spicy foods right before bedtime. This can lead to digestive discomfort that can make it hard for you to sleep.  Limit screen use before bedtime. This includes:  Watching TV.  Using your smartphone, tablet, and computer.  Stick to a routine. This can help you fall asleep faster. Try to do a quiet activity, brush your teeth, and go to bed at the same time each night.  Get out of bed if you are still awake after 15 minutes of trying to sleep. Keep the lights down, but try reading or doing a quiet activity. When you feel sleepy, go back to bed.  Make sure that you drive carefully. Avoid driving if you feel very sleepy.  Keep all follow-up appointments as directed by your health care provider. This is important. SEEK MEDICAL CARE IF:   You are tired throughout the day or have trouble in your daily routine due to sleepiness.  You continue to have sleep problems or your sleep problems get worse. SEEK IMMEDIATE MEDICAL CARE IF:   You have serious thoughts about hurting yourself or someone else.   This information is not intended to replace advice given to you by your health care provider. Make sure you discuss any questions you have with your health care provider.   Document Released: 09/06/2000 Document Revised: 05/31/2015 Document Reviewed: 06/10/2014 Elsevier Interactive Patient Education 2016 Elsevier Inc.  

## 2015-08-29 NOTE — Progress Notes (Addendum)
Subjective:    Patient ID: Chris Powell, male    DOB: [birth year], 49 y.o.   MRN: [id number]  Pt presents to the office today for chronic follow up. Pt states he had a MI on December 21 2014. Pt states he has not seen  his Cardiologists in about 4 months. Hypertension This is a chronic problem. The current episode started more than 1 year ago. The problem has been waxing and waning since onset. The problem is uncontrolled. Pertinent negatives include no anxiety, headaches, palpitations, peripheral edema or shortness of breath. Risk factors for coronary artery disease include dyslipidemia, male gender, obesity, sedentary lifestyle, smoking/tobacco exposure and family history. Past treatments include ACE inhibitors, calcium channel blockers, diuretics, beta blockers and central alpha agonists. The current treatment provides significant improvement. Hypertensive end-organ damage includes CAD/MI and heart failure. There is no history of kidney disease, CVA or a thyroid problem. Identifiable causes of hypertension include sleep apnea.  Hyperlipidemia This is a chronic problem. The current episode started more than 1 year ago. The problem is controlled. Recent lipid tests were reviewed and are normal. He has no history of diabetes or hypothyroidism. Factors aggravating his hyperlipidemia include smoking. Pertinent negatives include no leg pain or shortness of breath. Current antihyperlipidemic treatment includes statins. The current treatment provides significant improvement of lipids. Risk factors for coronary artery disease include dyslipidemia, family history, hypertension, male sex and obesity.  Gastroesophageal Reflux He reports no belching, no coughing or no heartburn. This is a chronic problem. The current episode started more than 1 year ago. The problem occurs rarely. The problem has been resolved. He has tried a PPI for the symptoms. The treatment provided significant relief.  Knee Pain  The  incident occurred more than 1 week ago. There was no injury mechanism. The pain is present in the right knee and left knee. The quality of the pain is described as aching. The pain is at a severity of 7/10. The pain is moderate. Pertinent negatives include no loss of motion or muscle weakness. The symptoms are aggravated by weight bearing. He has tried NSAIDs for the symptoms. The treatment provided mild relief.      Review of Systems  Constitutional: Negative.   HENT: Negative.   Respiratory: Negative.  Negative for cough and shortness of breath.   Cardiovascular: Negative.  Negative for palpitations.  Gastrointestinal: Negative.  Negative for heartburn.  Endocrine: Negative.   Genitourinary: Negative.   Musculoskeletal: Negative.   Neurological: Negative.  Negative for headaches.  Hematological: Negative.   Psychiatric/Behavioral: Negative.   All other systems reviewed and are negative.      Objective:   Physical Exam  Constitutional: He is oriented to person, place, and time. He appears well-developed and well-nourished. No distress.  HENT:  Head: Normocephalic.  Right Ear: External ear normal.  Left Ear: External ear normal.  Nose: Nose normal.  Mouth/Throat: Oropharynx is clear and moist.  Eyes: Pupils are equal, round, and reactive to light. Right eye exhibits no discharge. Left eye exhibits no discharge.  Neck: Normal range of motion. Neck supple. No thyromegaly present.  Cardiovascular: Normal rate, regular rhythm, normal heart sounds and intact distal pulses.   No murmur heard. Pulmonary/Chest: Effort normal and breath sounds normal. No respiratory distress. He has no wheezes.  Abdominal: Soft. Bowel sounds are normal. He exhibits no distension. There is no tenderness.  Musculoskeletal: Normal range of motion. He exhibits no edema or tenderness.  Neurological: He is alert and oriented to  person, place, and time. He has normal reflexes. No cranial nerve deficit.  Skin:  Skin is warm and dry. No rash noted. No erythema.  Psychiatric: He has a normal mood and affect. His behavior is normal. Judgment and thought content normal.  Vitals reviewed.     BP 156/88 mmHg  Pulse 75  Temp(Src) 97.4 F (36.3 C) (Oral)  Ht 5' 7" (1.702 m)  Wt 340 lb 9.6 oz (154.495 kg)  BMI 53.33 kg/m2     Assessment & Plan:  1. Acute coronary syndrome (HCC) - CMP14+EGFR  2. Essential hypertension -PT's metoprolol increased to 50 mg from 25 mg -Daily blood pressure log given with instructions on how to fill out and told to bring to next visit -Dash diet information given -Exercise encouraged - Stress Management  -Continue current meds -RTO in 2 weeks - CMP14+EGFR - metoprolol succinate (TOPROL-XL) 50 MG 24 hr tablet; Take 1 tablet (50 mg total) by mouth daily. Take with or immediately following a meal.  Dispense: 90 tablet; Refill: 3  3. Chronic bronchitis, unspecified chronic bronchitis type (HCC) - CMP14+EGFR  4. Gastroesophageal reflux disease, esophagitis presence not specified - CMP14+EGFR  5. NSTEMI (non-ST elevated myocardial infarction) (HCC) - CMP14+EGFR  6. Hyperlipidemia - CMP14+EGFR - Lipid panel  7. Hypokalemia - CMP14+EGFR  8. Morbid obesity, unspecified obesity type ([place]) - CMP14+EGFR  9. Vitamin D deficiency - CMP14+EGFR - VITAMIN D 25 Hydroxy (Vit-D Deficiency, Fractures)  10. Smoking  - CMP14+EGFR  11. Metabolic syndrome - FKC12+XNTZ   Continue all meds,pt told to make follow up appt with Cardiologists  Labs pending Health Maintenance reviewed Diet and exercise encouraged RTO 2 weeks to recheck HTN  Evelina Dun, FNP

## 2015-08-30 ENCOUNTER — Other Ambulatory Visit: Payer: Self-pay | Admitting: Family

## 2015-08-30 ENCOUNTER — Ambulatory Visit: Payer: Medicaid Other | Admitting: Family

## 2015-08-30 LAB — CMP14+EGFR
A/G RATIO: 1.3 (ref 1.1–2.5)
ALK PHOS: 78 IU/L (ref 39–117)
ALT: 22 IU/L (ref 0–44)
AST: 13 IU/L (ref 0–40)
Albumin: 4.1 g/dL (ref 3.5–5.5)
BILIRUBIN TOTAL: 0.4 mg/dL (ref 0.0–1.2)
BUN / CREAT RATIO: 10 (ref 9–20)
BUN: 10 mg/dL (ref 6–24)
CHLORIDE: 100 mmol/L (ref 97–106)
CO2: 24 mmol/L (ref 18–29)
Calcium: 9 mg/dL (ref 8.7–10.2)
Creatinine, Ser: 0.99 mg/dL (ref 0.76–1.27)
GFR calc non Af Amer: 89 mL/min/{1.73_m2} (ref >59)
GFR, EST AFRICAN AMERICAN: 103 mL/min/{1.73_m2} (ref >59)
Globulin, Total: 3.1 g/dL (ref 1.5–4.5)
Glucose: 112 mg/dL — ABNORMAL HIGH (ref 65–99)
POTASSIUM: 3.6 mmol/L (ref 3.5–5.2)
Sodium: 141 mmol/L (ref 136–144)
TOTAL PROTEIN: 7.2 g/dL (ref 6.0–8.5)

## 2015-08-30 LAB — LIPID PANEL
Chol/HDL Ratio: 4.7 ratio units (ref 0.0–5.0)
Cholesterol, Total: 149 mg/dL (ref 100–199)
HDL: 32 mg/dL — AB (ref >39)
LDL Calculated: 87 mg/dL (ref 0–99)
Triglycerides: 149 mg/dL (ref 0–149)
VLDL Cholesterol Cal: 30 mg/dL (ref 5–40)

## 2015-08-30 LAB — VITAMIN D 25 HYDROXY (VIT D DEFICIENCY, FRACTURES): VIT D 25 HYDROXY: 29 ng/mL — AB (ref 30.0–100.0)

## 2015-08-30 MED ORDER — VITAMIN D (ERGOCALCIFEROL) 1.25 MG (50000 UNIT) PO CAPS: 50000.0000 [IU] | ORAL_CAPSULE | ORAL | Status: DC

## 2015-09-06 DIAGNOSIS — S43401A Unspecified sprain of right shoulder joint, initial encounter: Secondary | ICD-10-CM | POA: Insufficient documentation

## 2015-09-06 DIAGNOSIS — M19019 Primary osteoarthritis, unspecified shoulder: Secondary | ICD-10-CM | POA: Insufficient documentation

## 2015-09-11 ENCOUNTER — Telehealth: Payer: Self-pay | Admitting: Family Medicine

## 2015-09-11 MED ORDER — GABAPENTIN 300 MG PO CAPS: 300.0000 mg | ORAL_CAPSULE | Freq: Every day | ORAL | Status: DC

## 2015-09-11 NOTE — Telephone Encounter (Signed)
Called and discussed with the patient, he is having a flare of sciatic nerve pain and would like a refill of gabapentin.  He takes 300 mg before bed, this is usually refill by his pain clinic. However I think it's reasonable to refill this at this time. We have discussed the past but we will not prescribe narcotics as he is on a pain contract.  He requests ibuprofen refill as well, I have recommended that he try Tylenol today. He has had a heart attack and there are increased risk of heart attack with NSAIDs, however he needs a refill tomorrow that would be reasonable, but if he can get relief with gabapentin and tylenol that would be preferable.   Murtis SinkSam Zygmund Passero, MD [facility] 09/11/2015, 12:35 PM

## 2015-09-12 ENCOUNTER — Ambulatory Visit (INDEPENDENT_AMBULATORY_CARE_PROVIDER_SITE_OTHER): Payer: Medicaid Other | Admitting: Family

## 2015-09-12 ENCOUNTER — Encounter: Payer: Self-pay | Admitting: Family

## 2015-09-12 VITALS — BP 149/94 | HR 67 | Temp 97.3°F | Ht 67.0 in | Wt 346.7 lb

## 2015-09-12 DIAGNOSIS — M5442 Lumbago with sciatica, left side: Secondary | ICD-10-CM

## 2015-09-12 DIAGNOSIS — E876 Hypokalemia: Secondary | ICD-10-CM

## 2015-09-12 DIAGNOSIS — I10 Essential (primary) hypertension: Secondary | ICD-10-CM

## 2015-09-12 MED ORDER — AMLODIPINE BESYLATE 10 MG PO TABS: 10.0000 mg | ORAL_TABLET | Freq: Every day | ORAL | Status: DC

## 2015-09-12 MED ORDER — IBUPROFEN 800 MG PO TABS: 800.0000 mg | ORAL_TABLET | Freq: Three times a day (TID) | ORAL | Status: DC | PRN

## 2015-09-12 MED ORDER — CYCLOBENZAPRINE HCL 10 MG PO TABS: 10.0000 mg | ORAL_TABLET | Freq: Three times a day (TID) | ORAL | Status: DC | PRN

## 2015-09-12 MED ORDER — POTASSIUM CHLORIDE CRYS ER 20 MEQ PO TBCR: 20.0000 meq | EXTENDED_RELEASE_TABLET | Freq: Two times a day (BID) | ORAL | Status: DC

## 2015-09-12 NOTE — Patient Instructions (Signed)
Sciatica With Rehab The sciatic nerve runs from the back down the leg and is responsible for sensation and control of the muscles in the back (posterior) side of the thigh, lower leg, and foot. Sciatica is a condition that is characterized by inflammation of this nerve.  SYMPTOMS   Signs of nerve damage, including numbness and/or weakness along the posterior side of the lower extremity.  Pain in the back of the thigh that may also travel down the leg.  Pain that worsens when sitting for long periods of time.  Occasionally, pain in the back or buttock. CAUSES  Inflammation of the sciatic nerve is the cause of sciatica. The inflammation is due to something irritating the nerve. Common sources of irritation include:  Sitting for long periods of time.  Direct trauma to the nerve.  Arthritis of the spine.  Herniated or ruptured disk.  Slipping of the vertebrae (spondylolisthesis).  Pressure from soft tissues, such as muscles or ligament-like tissue (fascia). RISK INCREASES WITH:  Sports that place pressure or stress on the spine (football or weightlifting).  Poor strength and flexibility.  Failure to warm up properly before activity.  Family history of low back pain or disk disorders.  Previous back injury or surgery.  Poor body mechanics, especially when lifting, or poor posture. PREVENTION   Warm up and stretch properly before activity.  Maintain physical fitness:  Strength, flexibility, and endurance.  Cardiovascular fitness.  Learn and use proper technique, especially with posture and lifting. When possible, have coach correct improper technique.  Avoid activities that place stress on the spine. PROGNOSIS If treated properly, then sciatica usually resolves within 6 weeks. However, occasionally surgery is necessary.  RELATED COMPLICATIONS   Permanent nerve damage, including pain, numbness, tingle, or weakness.  Chronic back pain.  Risks of surgery: infection,  bleeding, nerve damage, or damage to surrounding tissues. TREATMENT Treatment initially involves resting from any activities that aggravate your symptoms. The use of ice and medication may help reduce pain and inflammation. The use of strengthening and stretching exercises may help reduce pain with activity. These exercises may be performed at home or with referral to a therapist. A therapist may recommend further treatments, such as transcutaneous electronic nerve stimulation (TENS) or ultrasound. Your caregiver may recommend corticosteroid injections to help reduce inflammation of the sciatic nerve. If symptoms persist despite non-surgical (conservative) treatment, then surgery may be recommended. MEDICATION  If pain medication is necessary, then nonsteroidal anti-inflammatory medications, such as aspirin and ibuprofen, or other minor pain relievers, such as acetaminophen, are often recommended.  Do not take pain medication for 7 days before surgery.  Prescription pain relievers may be given if deemed necessary by your caregiver. Use only as directed and only as much as you need.  Ointments applied to the skin may be helpful.  Corticosteroid injections may be given by your caregiver. These injections should be reserved for the most serious cases, because they may only be given a certain number of times. HEAT AND COLD  Cold treatment (icing) relieves pain and reduces inflammation. Cold treatment should be applied for 10 to 15 minutes every 2 to 3 hours for inflammation and pain and immediately after any activity that aggravates your symptoms. Use ice packs or massage the area with a piece of ice (ice massage).  Heat treatment may be used prior to performing the stretching and strengthening activities prescribed by your caregiver, physical therapist, or athletic trainer. Use a heat pack or soak the injury in warm water.   SEEK MEDICAL CARE IF:  Treatment seems to offer no benefit, or the condition  worsens.  Any medications produce adverse side effects. EXERCISES  RANGE OF MOTION (ROM) AND STRETCHING EXERCISES - Sciatica Most people with sciatic will find that their symptoms worsen with either excessive bending forward (flexion) or arching at the low back (extension). The exercises which will help resolve your symptoms will focus on the opposite motion. Your physician, physical therapist or athletic trainer will help you determine which exercises will be most helpful to resolve your low back pain. Do not complete any exercises without first consulting with your clinician. Discontinue any exercises which worsen your symptoms until you speak to your clinician. If you have pain, numbness or tingling which travels down into your buttocks, leg or foot, the goal of the therapy is for these symptoms to move closer to your back and eventually resolve. Occasionally, these leg symptoms will get better, but your low back pain may worsen; this is typically an indication of progress in your rehabilitation. Be certain to be very alert to any changes in your symptoms and the activities in which you participated in the 24 hours prior to the change. Sharing this information with your clinician will allow him/her to most efficiently treat your condition. These exercises may help you when beginning to rehabilitate your injury. Your symptoms may resolve with or without further involvement from your physician, physical therapist or athletic trainer. While completing these exercises, remember:   Restoring tissue flexibility helps normal motion to return to the joints. This allows healthier, less painful movement and activity.  An effective stretch should be held for at least 30 seconds.  A stretch should never be painful. You should only feel a gentle lengthening or release in the stretched tissue. FLEXION RANGE OF MOTION AND STRETCHING EXERCISES: STRETCH - Flexion, Single Knee to Chest   Lie on a firm bed or floor  with both legs extended in front of you.  Keeping one leg in contact with the floor, bring your opposite knee to your chest. Hold your leg in place by either grabbing behind your thigh or at your knee.  Pull until you feel a gentle stretch in your low back. Hold __________ seconds.  Slowly release your grasp and repeat the exercise with the opposite side. Repeat __________ times. Complete this exercise __________ times per day.  STRETCH - Flexion, Double Knee to Chest  Lie on a firm bed or floor with both legs extended in front of you.  Keeping one leg in contact with the floor, bring your opposite knee to your chest.  Tense your stomach muscles to support your back and then lift your other knee to your chest. Hold your legs in place by either grabbing behind your thighs or at your knees.  Pull both knees toward your chest until you feel a gentle stretch in your low back. Hold __________ seconds.  Tense your stomach muscles and slowly return one leg at a time to the floor. Repeat __________ times. Complete this exercise __________ times per day.  STRETCH - Low Trunk Rotation   Lie on a firm bed or floor. Keeping your legs in front of you, bend your knees so they are both pointed toward the ceiling and your feet are flat on the floor.  Extend your arms out to the side. This will stabilize your upper body by keeping your shoulders in contact with the floor.  Gently and slowly drop both knees together to one side until   you feel a gentle stretch in your low back. Hold for __________ seconds.  Tense your stomach muscles to support your low back as you bring your knees back to the starting position. Repeat the exercise to the other side. Repeat __________ times. Complete this exercise __________ times per day  EXTENSION RANGE OF MOTION AND FLEXIBILITY EXERCISES: STRETCH - Extension, Prone on Elbows  Lie on your stomach on the floor, a bed will be too soft. Place your palms about shoulder  width apart and at the height of your head.  Place your elbows under your shoulders. If this is too painful, stack pillows under your chest.  Allow your body to relax so that your hips drop lower and make contact more completely with the floor.  Hold this position for __________ seconds.  Slowly return to lying flat on the floor. Repeat __________ times. Complete this exercise __________ times per day.  RANGE OF MOTION - Extension, Prone Press Ups  Lie on your stomach on the floor, a bed will be too soft. Place your palms about shoulder width apart and at the height of your head.  Keeping your back as relaxed as possible, slowly straighten your elbows while keeping your hips on the floor. You may adjust the placement of your hands to maximize your comfort. As you gain motion, your hands will come more underneath your shoulders.  Hold this position __________ seconds.  Slowly return to lying flat on the floor. Repeat __________ times. Complete this exercise __________ times per day.  STRENGTHENING EXERCISES - Sciatica  These exercises may help you when beginning to rehabilitate your injury. These exercises should be done near your "sweet spot." This is the neutral, low-back arch, somewhere between fully rounded and fully arched, that is your least painful position. When performed in this safe range of motion, these exercises can be used for people who have either a flexion or extension based injury. These exercises may resolve your symptoms with or without further involvement from your physician, physical therapist or athletic trainer. While completing these exercises, remember:   Muscles can gain both the endurance and the strength needed for everyday activities through controlled exercises.  Complete these exercises as instructed by your physician, physical therapist or athletic trainer. Progress with the resistance and repetition exercises only as your caregiver advises.  You may  experience muscle soreness or fatigue, but the pain or discomfort you are trying to eliminate should never worsen during these exercises. If this pain does worsen, stop and make certain you are following the directions exactly. If the pain is still present after adjustments, discontinue the exercise until you can discuss the trouble with your clinician. STRENGTHENING - Deep Abdominals, Pelvic Tilt   Lie on a firm bed or floor. Keeping your legs in front of you, bend your knees so they are both pointed toward the ceiling and your feet are flat on the floor.  Tense your lower abdominal muscles to press your low back into the floor. This motion will rotate your pelvis so that your tail bone is scooping upwards rather than pointing at your feet or into the floor.  With a gentle tension and even breathing, hold this position for __________ seconds. Repeat __________ times. Complete this exercise __________ times per day.  STRENGTHENING - Abdominals, Crunches   Lie on a firm bed or floor. Keeping your legs in front of you, bend your knees so they are both pointed toward the ceiling and your feet are flat on the   floor. Cross your arms over your chest.  Slightly tip your chin down without bending your neck.  Tense your abdominals and slowly lift your trunk high enough to just clear your shoulder blades. Lifting higher can put excessive stress on the low back and does not further strengthen your abdominal muscles.  Control your return to the starting position. Repeat __________ times. Complete this exercise __________ times per day.  STRENGTHENING - Quadruped, Opposite UE/LE Lift  Assume a hands and knees position on a firm surface. Keep your hands under your shoulders and your knees under your hips. You may place padding under your knees for comfort.  Find your neutral spine and gently tense your abdominal muscles so that you can maintain this position. Your shoulders and hips should form a rectangle  that is parallel with the floor and is not twisted.  Keeping your trunk steady, lift your right hand no higher than your shoulder and then your left leg no higher than your hip. Make sure you are not holding your breath. Hold this position __________ seconds.  Continuing to keep your abdominal muscles tense and your back steady, slowly return to your starting position. Repeat with the opposite arm and leg. Repeat __________ times. Complete this exercise __________ times per day.  STRENGTHENING - Abdominals and Quadriceps, Straight Leg Raise   Lie on a firm bed or floor with both legs extended in front of you.  Keeping one leg in contact with the floor, bend the other knee so that your foot can rest flat on the floor.  Find your neutral spine, and tense your abdominal muscles to maintain your spinal position throughout the exercise.  Slowly lift your straight leg off the floor about 6 inches for a count of 15, making sure to not hold your breath.  Still keeping your neutral spine, slowly lower your leg all the way to the floor. Repeat this exercise with each leg __________ times. Complete this exercise __________ times per day. POSTURE AND BODY MECHANICS CONSIDERATIONS - Sciatica Keeping correct posture when sitting, standing or completing your activities will reduce the stress put on different body tissues, allowing injured tissues a chance to heal and limiting painful experiences. The following are general guidelines for improved posture. Your physician or physical therapist will provide you with any instructions specific to your needs. While reading these guidelines, remember:  The exercises prescribed by your provider will help you have the flexibility and strength to maintain correct postures.  The correct posture provides the optimal environment for your joints to work. All of your joints have less wear and tear when properly supported by a spine with good posture. This means you will  experience a healthier, less painful body.  Correct posture must be practiced with all of your activities, especially prolonged sitting and standing. Correct posture is as important when doing repetitive low-stress activities (typing) as it is when doing a single heavy-load activity (lifting). RESTING POSITIONS Consider which positions are most painful for you when choosing a resting position. If you have pain with flexion-based activities (sitting, bending, stooping, squatting), choose a position that allows you to rest in a less flexed posture. You would want to avoid curling into a fetal position on your side. If your pain worsens with extension-based activities (prolonged standing, working overhead), avoid resting in an extended position such as sleeping on your stomach. Most people will find more comfort when they rest with their spine in a more neutral position, neither too rounded nor too   arched. Lying on a non-sagging bed on your side with a pillow between your knees, or on your back with a pillow under your knees will often provide some relief. Keep in mind, being in any one position for a prolonged period of time, no matter how correct your posture, can still lead to stiffness. PROPER SITTING POSTURE In order to minimize stress and discomfort on your spine, you must sit with correct posture Sitting with good posture should be effortless for a healthy body. Returning to good posture is a gradual process. Many people can work toward this most comfortably by using various supports until they have the flexibility and strength to maintain this posture on their own. When sitting with proper posture, your ears will fall over your shoulders and your shoulders will fall over your hips. You should use the back of the chair to support your upper back. Your low back will be in a neutral position, just slightly arched. You may place a small pillow or folded towel at the base of your low back for support.  When  working at a desk, create an environment that supports good, upright posture. Without extra support, muscles fatigue and lead to excessive strain on joints and other tissues. Keep these recommendations in mind: CHAIR:   A chair should be able to slide under your desk when your back makes contact with the back of the chair. This allows you to work closely.  The chair's height should allow your eyes to be level with the upper part of your monitor and your hands to be slightly lower than your elbows. BODY POSITION  Your feet should make contact with the floor. If this is not possible, use a foot rest.  Keep your ears over your shoulders. This will reduce stress on your neck and low back. INCORRECT SITTING POSTURES   If you are feeling tired and unable to assume a healthy sitting posture, do not slouch or slump. This puts excessive strain on your back tissues, causing more damage and pain. Healthier options include:  Using more support, like a lumbar pillow.  Switching tasks to something that requires you to be upright or walking.  Talking a brief walk.  Lying down to rest in a neutral-spine position. PROLONGED STANDING WHILE SLIGHTLY LEANING FORWARD  When completing a task that requires you to lean forward while standing in one place for a long time, place either foot up on a stationary 2-4 inch high object to help maintain the best posture. When both feet are on the ground, the low back tends to lose its slight inward curve. If this curve flattens (or becomes too large), then the back and your other joints will experience too much stress, fatigue more quickly and can cause pain.  CORRECT STANDING POSTURES Proper standing posture should be assumed with all daily activities, even if they only take a few moments, like when brushing your teeth. As in sitting, your ears should fall over your shoulders and your shoulders should fall over your hips. You should keep a slight tension in your abdominal  muscles to brace your spine. Your tailbone should point down to the ground, not behind your body, resulting in an over-extended swayback posture.  INCORRECT STANDING POSTURES  Common incorrect standing postures include a forward head, locked knees and/or an excessive swayback. WALKING Walk with an upright posture. Your ears, shoulders and hips should all line-up. PROLONGED ACTIVITY IN A FLEXED POSITION When completing a task that requires you to bend forward   at your waist or lean over a low surface, try to find a way to stabilize 3 of 4 of your limbs. You can place a hand or elbow on your thigh or rest a knee on the surface you are reaching across. This will provide you more stability so that your muscles do not fatigue as quickly. By keeping your knees relaxed, or slightly bent, you will also reduce stress across your low back. CORRECT LIFTING TECHNIQUES DO :   Assume a wide stance. This will provide you more stability and the opportunity to get as close as possible to the object which you are lifting.  Tense your abdominals to brace your spine; then bend at the knees and hips. Keeping your back locked in a neutral-spine position, lift using your leg muscles. Lift with your legs, keeping your back straight.  Test the weight of unknown objects before attempting to lift them.  Try to keep your elbows locked down at your sides in order get the best strength from your shoulders when carrying an object.  Always ask for help when lifting heavy or awkward objects. INCORRECT LIFTING TECHNIQUES DO NOT:   Lock your knees when lifting, even if it is a small object.  Bend and twist. Pivot at your feet or move your feet when needing to change directions.  Assume that you cannot safely pick up a paperclip without proper posture.   This information is not intended to replace advice given to you by your health care provider. Make sure you discuss any questions you have with your health care provider.     Document Released: 09/09/2005 Document Revised: 01/24/2015 Document Reviewed: 12/22/2008 [employer] Interactive Patient Education 2016 [employer]  

## 2015-09-12 NOTE — Progress Notes (Signed)
Subjective:    Patient ID: Chris Powell, male    DOB: 1966-04-11, 49 y.o.   MRN: [id number]  PT presents to the office today to recheck HTN. PT's BP is not at goal. Pt states his sciatic has "flared up" and is in pain today. Pt states he is having constant pain of 8 out 10 that radiates down his left leg. Pt states he is taking gabapentin at night, oxycodone 10 mg TID, and Motrin 800 mg TID. PT goes to a pain clinic every month.  Hypertension This is a chronic problem. The current episode started more than 1 year ago. The problem has been waxing and waning since onset. The problem is uncontrolled. Pertinent negatives include no anxiety, headaches, palpitations, peripheral edema or shortness of breath. Risk factors for coronary artery disease include obesity, male gender, sedentary lifestyle and family history. Past treatments include ACE inhibitors, calcium channel blockers, beta blockers and diuretics. The current treatment provides moderate improvement. Hypertensive end-organ damage includes CAD/MI. There is no history of kidney disease, heart failure or a thyroid problem.      Review of Systems  Constitutional: Negative.   HENT: Negative.   Respiratory: Negative.  Negative for shortness of breath.   Cardiovascular: Negative.  Negative for palpitations.  Gastrointestinal: Negative.   Endocrine: Negative.   Genitourinary: Negative.   Musculoskeletal: Negative.   Neurological: Negative.  Negative for headaches.  Hematological: Negative.   Psychiatric/Behavioral: Negative.   All other systems reviewed and are negative.      Objective:   Physical Exam  Constitutional: He is oriented to person, place, and time. He appears well-developed and well-nourished. No distress.  HENT:  Head: Normocephalic.  Right Ear: External ear normal.  Left Ear: External ear normal.  Nose: Nose normal.  Mouth/Throat: Oropharynx is clear and moist.  Eyes: Pupils are equal, round, and reactive to  light. Right eye exhibits no discharge. Left eye exhibits no discharge.  Neck: Normal range of motion. Neck supple. No thyromegaly present.  Cardiovascular: Normal rate, regular rhythm, normal heart sounds and intact distal pulses.   No murmur heard. Pulmonary/Chest: Effort normal and breath sounds normal. No respiratory distress. He has no wheezes.  Abdominal: Soft. Bowel sounds are normal. He exhibits no distension. There is no tenderness.  Musculoskeletal: Normal range of motion. He exhibits no edema or tenderness.  Neurological: He is alert and oriented to person, place, and time. He has normal reflexes. No cranial nerve deficit.  Skin: Skin is warm and dry. No rash noted. No erythema.  Psychiatric: He has a normal mood and affect. His behavior is normal. Judgment and thought content normal.  Vitals reviewed.     BP 149/94 mmHg  Pulse 67  Temp(Src) 97.3 F (36.3 C) (Oral)  Ht _0  (1.702 m)  Wt 346 lb 11.2 oz (157.262 kg)  BMI 54.29 kg/m2     Assessment & Plan:  1. Essential hypertension -Norvasc increased to 10 mg today -Daily blood pressure log given with instructions on how to fill out and told to bring to next visit -Dash diet information given -Exercise encouraged - Stress Management  -Continue current meds -RTO in 2 weeks - amLODipine (NORVASC) 10 MG tablet; Take 1 tablet (10 mg total) by mouth daily.  Dispense: 90 tablet; Refill: 1 - BMP8+EGFR  2. Hypokalemia - potassium chloride SA (K-DUR,KLOR-CON) 20 MEQ tablet; Take 1 tablet (20 mEq total) by mouth 2 (two) times daily.  Dispense: 30 tablet; Refill: 11 - BMP8+EGFR  3.  Left-sided low back pain with left-sided sciatica -Rest -Exercises discussed -Motrin reordered today -Robaxin stopped and Flexeril 10 mg ordered today -Pt to keep pain clinic appts - ibuprofen (ADVIL,MOTRIN) 800 MG tablet; Take 1 tablet (800 mg total) by mouth every 8 (eight) hours as needed.  Dispense: 30 tablet; Refill: 0 - BMP8+EGFR -  cyclobenzaprine (FLEXERIL) 10 MG tablet; Take 1 tablet (10 mg total) by mouth 3 (three) times daily as needed for muscle spasms.  Dispense: 60 tablet; Refill: 3   Continue all meds Labs pending Health Maintenance reviewed Diet and exercise encouraged RTO 2 weeks  Evelina Dun, FNP

## 2015-09-12 NOTE — Addendum Note (Signed)
Addended by: Prescott GumLAND, Lillianna Sabel M on: 09/12/2015 09:33 AM   Modules accepted: Kipp BroodSmartSet

## 2015-09-13 LAB — BMP8+EGFR
BUN / CREAT RATIO: 9 (ref 9–20)
BUN: 8 mg/dL (ref 6–24)
CHLORIDE: 100 mmol/L (ref 96–106)
CO2: 25 mmol/L (ref 18–29)
Calcium: 9 mg/dL (ref 8.7–10.2)
Creatinine, Ser: 0.88 mg/dL (ref 0.76–1.27)
GFR calc Af Amer: 117 mL/min/{1.73_m2} (ref >59)
GFR calc non Af Amer: 101 mL/min/{1.73_m2} (ref >59)
GLUCOSE: 99 mg/dL (ref 65–99)
Potassium: 3.5 mmol/L (ref 3.5–5.2)
SODIUM: 140 mmol/L (ref 134–144)

## 2015-09-27 ENCOUNTER — Ambulatory Visit (HOSPITAL_COMMUNITY): Payer: Medicaid Other | Attending: Orthopedic Surgery | Admitting: Occupational Therapy

## 2015-09-27 ENCOUNTER — Encounter (HOSPITAL_COMMUNITY): Payer: Self-pay | Admitting: Occupational Therapy

## 2015-09-27 DIAGNOSIS — M6281 Muscle weakness (generalized): Secondary | ICD-10-CM | POA: Insufficient documentation

## 2015-09-27 DIAGNOSIS — S43401D Unspecified sprain of right shoulder joint, subsequent encounter: Secondary | ICD-10-CM

## 2015-09-27 DIAGNOSIS — X58XXXD Exposure to other specified factors, subsequent encounter: Secondary | ICD-10-CM | POA: Insufficient documentation

## 2015-09-27 DIAGNOSIS — M25511 Pain in right shoulder: Secondary | ICD-10-CM | POA: Insufficient documentation

## 2015-09-27 NOTE — Therapy (Signed)
[facility] [address] , [place], [address] Phone: [phone]   Fax:  [phone]  Occupational Therapy Evaluation  Patient Details  Name: Chris HugerMaurice D Neiss MRN: [id number] Date of Birth: 02/27/1966 Referring Provider: Dr. Merlyn LotKuzma  Encounter Date: 09/27/2015      OT End of Session - 09/27/15 1501    Visit Number 1   Number of Visits 1   Date for OT Re-Evaluation 11/26/15   Authorization Type Medicaid   OT Start Time 0930   OT Stop Time 1000   OT Time Calculation (min) 30 min   Activity Tolerance Patient tolerated treatment well   Behavior During Therapy [facility] for tasks assessed/performed      Past Medical History  Diagnosis Date  . Hypertension   . COPD (chronic obstructive pulmonary disease) (HCC)   . Asthma   . Arthritis   . Coronary artery disease   . Sleep apnea     USES CPAP  . Shortness of breath dyspnea   . Pneumonia     HX OF PNA  . Depression   . GERD (gastroesophageal reflux disease)   . Myocardial infarction [facility](HCC)     Past Surgical History  Procedure Laterality Date  . Knee surgery    . Coronary stent placement  12/22/2014    LAD  . Cardiac catheterization  12/22/2014  . Left heart catheterization with coronary angiogram N/A 12/22/2014    Procedure: LEFT HEART CATHETERIZATION WITH CORONARY ANGIOGRAM;  Surgeon: Corky CraftsJayadeep S Varanasi, MD;  [facility];  Service: Cardiovascular;  Laterality: N/A;    There were no vitals filed for this visit.  Visit Diagnosis:  Sprain of right shoulder, subsequent encounter  Pain in right shoulder  Muscle weakness of right upper extremity      Subjective Assessment - 09/27/15 1255    Subjective  S: My shoulder has been feeling better than when I originally hurt it.    Pertinent History Pt is a 50 y/o male presenting with right shoulder sprain which occured in November of 2016 while pt was completing yard work. Pt reports he threw his arms up to catch his  balance and his right shoulder has been hurting ever since. Pt take pain medication for another condition which also helps the shoulder pain. Dr. Merlyn LotKuzma referred pt to occupational therapy for evaluation and treatment.    Patient Stated Goals to get rid of the pain in my arm.    Currently in Pain? No/denies           [facility] - 09/27/15 0934    Assessment   Diagnosis Right shoulder strain   Referring Provider Dr. Merlyn LotKuzma   Onset Date 07/27/16   Prior Therapy none   Precautions   Precautions None   Restrictions   Weight Bearing Restrictions No   Balance Screen   Has the patient fallen in the past 6 months Yes   How many times? 1   Has the patient had a decrease in activity level because of a fear of falling?  No   Is the patient reluctant to leave their home because of a fear of falling?  No   Prior Function   Level of Independence Independent with basic ADLs   Vocation Unemployed   Leisure playing with grandkids, yardwork   ADL   ADL comments Pt reports he is able to complete all daily tasks with minimal difficulty, pain is the only complaint at this time.  Written Expression   Dominant Hand Right   Cognition   Overall Cognitive Status Within Functional Limits for tasks assessed   ROM / Strength   AROM / PROM / Strength AROM;PROM;Strength   Palpation   Palpation comment Pt has mod fascial restrictions in right upper arm, trapezius, and scapularis regions.    AROM   Overall AROM Comments Assessed seated, ER/IR adducted   AROM Assessment Site Shoulder   Right/Left Shoulder Right   Right Shoulder Flexion 163 Degrees   Right Shoulder ABduction 84 Degrees   Right Shoulder Internal Rotation 90 Degrees   Right Shoulder External Rotation 85 Degrees   PROM   Overall PROM Comments Assessed seated, ER/IR adducted    PROM Assessment Site Shoulder   Right/Left Shoulder Right   Right Shoulder Flexion 170 Degrees   Right Shoulder ABduction 98 Degrees   Right Shoulder  Internal Rotation 90 Degrees   Right Shoulder External Rotation 90 Degrees   Strength   Overall Strength Comments Assessed seated, ER/IR adducted   Strength Assessment Site Shoulder   Right/Left Shoulder Right   Right Shoulder Flexion 4/5   Right Shoulder ABduction 3-/5   Right Shoulder Internal Rotation 4/5   Right Shoulder External Rotation 4/5                           OT Short Term Goals - 09/27/15 1504    OT SHORT TERM GOAL #1   Title Pt will be educated on and independent in HEP.    Time 1   Period Days   Status Achieved                  Plan - 09/27/15 1501    Clinical Impression Statement A: Pt is a 50 y/o male presenting with pain, increased fasical restrictions, decreased strength, and decreased range of motion in the RUE due to a right shoulder sprain obtained in November. Discussed insurance coverage with pt, explaining that Medicaid with not pay for visits with his diagnosis. Educated pt on extensive, graduated HEP for shoulder range of motion and strength. Pt verbalizes understanding.    Pt will benefit from skilled therapeutic intervention in order to improve on the following deficits (Retired) Decreased strength;Pain;Impaired UE functional use;Decreased range of motion;Increased fascial restricitons;Impaired flexibility   Rehab Potential Good   OT Frequency 1x / week   OT Duration --  1 visit   OT Treatment/Interventions Patient/family education   Plan P: Pt educated on extensive, graduated HEP for right shoulder range of motion and strengthening. Pt verbalized understanding.    OT Home Exercise Plan AA/ROM, A/ROM, strengthening, shoulder stretches        Problem List Patient Active Problem List   Diagnosis Date Noted  . Metabolic syndrome 08/29/2015  . Abdominal wall pain 08/15/2015  . Right wrist pain 07/13/2015  . GERD (gastroesophageal reflux disease) 02/28/2015  . Vitamin D deficiency 02/28/2015  . Hyperlipidemia  02/28/2015  . Chronic knee pain 02/28/2015  . Cocaine abuse 01/25/2015  . NSTEMI (non-ST elevated myocardial infarction) (HCC) 01/24/2015  . CAD - S/P LAD DES 3/11/22/14 12/23/2014  . Morbid obesity-BMI 51 12/23/2014  . COPD (chronic obstructive pulmonary disease) (HCC) 12/23/2014  . HTN (hypertension) 12/23/2014  . Smoking 12/23/2014  . Non-sustained ventricular tachycardia (HCC) 12/23/2014  . Hypokalemia 12/23/2014  . Acute coronary syndrome [facility]) 12/22/2014    Ezra Sites, OTR/L  [phone]  09/27/2015, 3:06 PM  [facility] [address] [place], [place], [address] Phone: [phone]   Fax:  [phone]  Name: SERAFIN DECATUR MRN: [id number] Date of Birth: 12-08-1965

## 2015-09-27 NOTE — Telephone Encounter (Signed)
SPOKE TO PT ABOUT RESULTS AND TO TAKE TWO EXTRA POTASSIUM TODAY PILLS AND COME IN ON 02/02/15 FOR A BMET REDRAW

## 2015-09-27 NOTE — Patient Instructions (Signed)
Perform each exercise ________ reps. 2-3x days.    Shoulder ABDUCTION - STANDING  While holding a wand/cane palm face up on the injured side and palm face down on the uninjured side, slowly raise up your injured arm to the side.       Horizontal Abduction/Adduction      Straight arms holding cane at shoulder height, bring cane to right, center, left. Repeat starting to left.   Copyright  VHI. All rights reserved.     1) Shoulder Protraction    Begin with elbows by your side, slowly "punch" straight out in front of you keeping arms/elbows straight.    2) Shoulder Flexion  Supine:     Standing:         Begin with arms at your side with thumbs pointed up, slowly raise both arms up and forward towards overhead.       3) Horizontal abduction/adduction  Supine:   Standing:           Begin with arms straight out in front of you, bring out to the side in at "T" shape. Keep arms straight entire time.    4) Internal & External Rotation    *No band* -Stand with elbows at the side and elbows bent 90 degrees. Move your forearms away from your body, then bring back inward toward the body.     5) Shoulder Abduction  Supine:     Standing:       Lying on your back begin with your arms flat on the table next to your side. Slowly move your arms out to the side so that they go overhead, in a jumping jack or snow angel movement.      (Home) Extension: Isometric / Bilateral Arm Retraction - Sitting   Facing anchor, hold hands and elbow at shoulder height, with elbow bent.  Pull arms back to squeeze shoulder blades together. Repeat 10-15 times.  Copyright  VHI. All rights reserved.   (Home) Retraction: Row - Bilateral (Anchor)   Facing anchor, arms reaching forward, pull hands toward stomach, keeping elbows bent and at your sides and pinching shoulder blades together. Repeat 10-15 times.  Copyright  VHI. All rights reserved.   (Clinic) Extension  / Flexion (Assist)   Face anchor, pull arms back, keeping elbow straight, and squeze shoulder blades together. Repeat 10-15 times.   Copyright  VHI. All rights reserved.       1) Flexion Wall Stretch    Face wall, place affected handon wall in front of you. Slide hand up the wall  and lean body in towards the wall. Hold for 5 seconds. Repeat 10 times. 1-2 times/day.     2) Towel Stretch with Internal Rotation   Gently pull up your affected arm  behind your back with the assist of a towel    3) Corner Stretch    Stand at a corner of a wall, place your arms on the walls with elbows bent. Lean into the corner until a stretch is felt along the front of your chest and/or shoulders. Hold for 5 seconds. Repeat 10X, 1-2 times/day.    4) Posterior Capsule Stretch    Bring the involved arm across chest. Grasp elbow and pull toward chest until you feel a stretch in the back of the upper arm and shoulder. Hold 5 seconds. Repeat 10X. Complete 1-2 times/day.    5) Scapular Retraction    Tuck chin back as you pinch shoulder blades together.  Hold 5  seconds. Repeat 10X. Complete 1-2 times/day.

## 2015-09-29 ENCOUNTER — Ambulatory Visit: Payer: Medicaid Other | Admitting: Family

## 2015-10-03 ENCOUNTER — Encounter: Payer: Self-pay | Admitting: Family

## 2015-10-03 ENCOUNTER — Ambulatory Visit (INDEPENDENT_AMBULATORY_CARE_PROVIDER_SITE_OTHER): Payer: Medicaid Other | Admitting: Family

## 2015-10-03 VITALS — BP 133/82 | HR 77 | Temp 98.2°F | Ht 67.0 in | Wt 352.6 lb

## 2015-10-03 DIAGNOSIS — J309 Allergic rhinitis, unspecified: Secondary | ICD-10-CM | POA: Diagnosis not present

## 2015-10-03 DIAGNOSIS — I10 Essential (primary) hypertension: Secondary | ICD-10-CM

## 2015-10-03 DIAGNOSIS — M5442 Lumbago with sciatica, left side: Secondary | ICD-10-CM | POA: Diagnosis not present

## 2015-10-03 MED ORDER — IBUPROFEN 800 MG PO TABS: 800.0000 mg | ORAL_TABLET | Freq: Three times a day (TID) | ORAL | Status: DC | PRN

## 2015-10-03 MED ORDER — MOMETASONE FUROATE 50 MCG/ACT NA SUSP
2.0000 | Freq: Every day | NASAL | Status: DC
Start: 2015-10-03 — End: 2017-12-03

## 2015-10-03 NOTE — Progress Notes (Signed)
   Subjective:    Patient ID: Chris Powell, male    DOB: Oct 24, 1965, 50 y.o.   MRN: [id number]  Pt presents to the office today to recheck HTN.  Hypertension This is a chronic problem. The current episode started more than 1 year ago. The problem has been resolved since onset. The problem is controlled. Pertinent negatives include no blurred vision, headaches, malaise/fatigue, peripheral edema or shortness of breath. Risk factors for coronary artery disease include obesity, male gender, sedentary lifestyle, family history and dyslipidemia. Past treatments include diuretics, calcium channel blockers, beta blockers and ACE inhibitors. The current treatment provides significant improvement. Hypertensive end-organ damage includes CAD/MI. There is no history of kidney disease, CVA, heart failure or a thyroid problem.      Review of Systems  Constitutional: Negative.  Negative for malaise/fatigue.  HENT: Negative.   Eyes: Negative for blurred vision.  Respiratory: Negative.  Negative for shortness of breath.   Cardiovascular: Negative.   Gastrointestinal: Negative.   Endocrine: Negative.   Genitourinary: Negative.   Musculoskeletal: Negative.   Neurological: Negative.  Negative for headaches.  Hematological: Negative.   Psychiatric/Behavioral: Negative.   All other systems reviewed and are negative.      Objective:   Physical Exam  Constitutional: He is oriented to person, place, and time. He appears well-developed and well-nourished. No distress.  HENT:  Head: Normocephalic.  Eyes: Pupils are equal, round, and reactive to light. Right eye exhibits no discharge. Left eye exhibits no discharge.  Neck: Normal range of motion. Neck supple. No thyromegaly present.  Cardiovascular: Normal rate, regular rhythm, normal heart sounds and intact distal pulses.   No murmur heard. Pulmonary/Chest: Effort normal and breath sounds normal. No respiratory distress. He has no wheezes.  Abdominal:  Soft. Bowel sounds are normal. He exhibits no distension. There is no tenderness.  Musculoskeletal: Normal range of motion. He exhibits no edema or tenderness.  Neurological: He is alert and oriented to person, place, and time. He has normal reflexes. No cranial nerve deficit.  Skin: Skin is warm and dry. No rash noted. No erythema.  Psychiatric: He has a normal mood and affect. His behavior is normal. Judgment and thought content normal.  Vitals reviewed.     BP 133/82 mmHg  Pulse 77  Temp(Src) 98.2 F (36.8 C) (Oral)  Ht '5\' 7"'$  (1.702 m)  Wt 352 lb 9.6 oz (159.938 kg)  BMI 55.21 kg/m2     Assessment & Plan:  1. Essential hypertension --Daily blood pressure log given with instructions on how to fill out and told to bring to next visit -Dash diet information given -Exercise encouraged - Stress Management  -Continue current meds -RTO in 6 months - BMP8+EGFR  2. Allergic rhinitis, unspecified allergic rhinitis type - mometasone (NASONEX) 50 MCG/ACT nasal spray; Place 2 sprays into the nose daily.  Dispense: 17 g; Refill: 12  3. Left-sided low back pain with left-sided sciatica - ibuprofen (ADVIL,MOTRIN) 800 MG tablet; Take 1 tablet (800 mg total) by mouth every 8 (eight) hours as needed.  Dispense: 30 tablet; Refill: [place], FNP

## 2015-10-03 NOTE — Patient Instructions (Signed)
DASH Eating Plan °DASH stands for "Dietary Approaches to Stop Hypertension." The DASH eating plan is a healthy eating plan that has been shown to reduce high blood pressure (hypertension). Additional health benefits may include reducing the risk of type 2 diabetes mellitus, heart disease, and stroke. The DASH eating plan may also help with weight loss. °WHAT DO I NEED TO KNOW ABOUT THE DASH EATING PLAN? °For the DASH eating plan, you will follow these general guidelines: °· Choose foods with a percent daily value for sodium of less than 5% (as listed on the food label). °· Use salt-free seasonings or herbs instead of table salt or sea salt. °· Check with your health care provider or pharmacist before using salt substitutes. °· Eat lower-sodium products, often labeled as "lower sodium" or "no salt added." °· Eat fresh foods. °· Eat more vegetables, fruits, and low-fat dairy products. °· Choose whole grains. Look for the word "whole" as the first word in the ingredient list. °· Choose fish and skinless chicken or turkey more often than red meat. Limit fish, poultry, and meat to 6 oz (170 g) each day. °· Limit sweets, desserts, sugars, and sugary drinks. °· Choose heart-healthy fats. °· Limit cheese to 1 oz (28 g) per day. °· Eat more home-cooked food and less restaurant, buffet, and fast food. °· Limit fried foods. °· Cook foods using methods other than frying. °· Limit canned vegetables. If you do use them, rinse them well to decrease the sodium. °· When eating at a restaurant, ask that your food be prepared with less salt, or no salt if possible. °WHAT FOODS CAN I EAT? °Seek help from a dietitian for individual calorie needs. °Grains °Whole grain or whole wheat bread. Brown rice. Whole grain or whole wheat pasta. Quinoa, bulgur, and whole grain cereals. Low-sodium cereals. Corn or whole wheat flour tortillas. Whole grain cornbread. Whole grain crackers. Low-sodium crackers. °Vegetables °Fresh or frozen vegetables  (raw, steamed, roasted, or grilled). Low-sodium or reduced-sodium tomato and vegetable juices. Low-sodium or reduced-sodium tomato sauce and paste. Low-sodium or reduced-sodium canned vegetables.  °Fruits °All fresh, canned (in natural juice), or frozen fruits. °Meat and Other Protein Products °Ground beef (85% or leaner), grass-fed beef, or beef trimmed of fat. Skinless chicken or turkey. Ground chicken or turkey. Pork trimmed of fat. All fish and seafood. Eggs. Dried beans, peas, or lentils. Unsalted nuts and seeds. Unsalted canned beans. °Dairy °Low-fat dairy products, such as skim or 1% milk, 2% or reduced-fat cheeses, low-fat ricotta or cottage cheese, or plain low-fat yogurt. Low-sodium or reduced-sodium cheeses. °Fats and Oils °Tub margarines without trans fats. Light or reduced-fat mayonnaise and salad dressings (reduced sodium). Avocado. Safflower, olive, or canola oils. Natural peanut or almond butter. °Other °Unsalted popcorn and pretzels. °The items listed above may not be a complete list of recommended foods or beverages. Contact your dietitian for more options. °WHAT FOODS ARE NOT RECOMMENDED? °Grains °White bread. White pasta. White rice. Refined cornbread. Bagels and croissants. Crackers that contain trans fat. °Vegetables °Creamed or fried vegetables. Vegetables in a cheese sauce. Regular canned vegetables. Regular canned tomato sauce and paste. Regular tomato and vegetable juices. °Fruits °Dried fruits. Canned fruit in light or heavy syrup. Fruit juice. °Meat and Other Protein Products °Fatty cuts of meat. Ribs, chicken wings, bacon, sausage, bologna, salami, chitterlings, fatback, hot dogs, bratwurst, and packaged luncheon meats. Salted nuts and seeds. Canned beans with salt. °Dairy °Whole or 2% milk, cream, half-and-half, and cream cheese. Whole-fat or sweetened yogurt. Full-fat   cheeses or blue cheese. Nondairy creamers and whipped toppings. Processed cheese, cheese spreads, or cheese  curds. °Condiments °Onion and garlic salt, seasoned salt, table salt, and sea salt. Canned and packaged gravies. Worcestershire sauce. Tartar sauce. Barbecue sauce. Teriyaki sauce. Soy sauce, including reduced sodium. Steak sauce. Fish sauce. Oyster sauce. Cocktail sauce. Horseradish. Ketchup and mustard. Meat flavorings and tenderizers. Bouillon cubes. Hot sauce. Tabasco sauce. Marinades. Taco seasonings. Relishes. °Fats and Oils °Butter, stick margarine, lard, shortening, ghee, and bacon fat. Coconut, palm kernel, or palm oils. Regular salad dressings. °Other °Pickles and olives. Salted popcorn and pretzels. °The items listed above may not be a complete list of foods and beverages to avoid. Contact your dietitian for more information. °WHERE CAN I FIND MORE INFORMATION? °National Heart, Lung, and Blood Institute: www.nhlbi.nih.gov/health/health-topics/topics/dash/ °  °This information is not intended to replace advice given to you by your health care provider. Make sure you discuss any questions you have with your health care provider. °  °Document Released: 08/29/2011 Document Revised: 09/30/2014 Document Reviewed: 07/14/2013 °Elsevier Interactive Patient Education ©2016 Elsevier Inc. ° °Hypertension °Hypertension, commonly called high blood pressure, is when the force of blood pumping through your arteries is too strong. Your arteries are the blood vessels that carry blood from your heart throughout your body. A blood pressure reading consists of a higher number over a lower number, such as 110/72. The higher number (systolic) is the pressure inside your arteries when your heart pumps. The lower number (diastolic) is the pressure inside your arteries when your heart relaxes. Ideally you want your blood pressure below 120/80. °Hypertension forces your heart to work harder to pump blood. Your arteries may become narrow or stiff. Having untreated or uncontrolled hypertension can cause heart attack, stroke, kidney  disease, and other problems. °RISK FACTORS °Some risk factors for high blood pressure are controllable. Others are not.  °Risk factors you cannot control include:  °· Race. You may be at higher risk if you are African American. °· Age. Risk increases with age. °· Gender. Men are at higher risk than women before age 45 years. After age 65, women are at higher risk than men. °Risk factors you can control include: °· Not getting enough exercise or physical activity. °· Being overweight. °· Getting too much fat, sugar, calories, or salt in your diet. °· Drinking too much alcohol. °SIGNS AND SYMPTOMS °Hypertension does not usually cause signs or symptoms. Extremely high blood pressure (hypertensive crisis) may cause headache, anxiety, shortness of breath, and nosebleed. °DIAGNOSIS °To check if you have hypertension, your health care provider will measure your blood pressure while you are seated, with your arm held at the level of your heart. It should be measured at least twice using the same arm. Certain conditions can cause a difference in blood pressure between your right and left arms. A blood pressure reading that is higher than normal on one occasion does not mean that you need treatment. If it is not clear whether you have high blood pressure, you may be asked to return on a different day to have your blood pressure checked again. Or, you may be asked to monitor your blood pressure at home for 1 or more weeks. °TREATMENT °Treating high blood pressure includes making lifestyle changes and possibly taking medicine. Living a healthy lifestyle can help lower high blood pressure. You may need to change some of your habits. °Lifestyle changes may include: °· Following the DASH diet. This diet is high in fruits, vegetables, and whole   grains. It is low in salt, red meat, and added sugars. °· Keep your sodium intake below 2,300 mg per day. °· Getting at least 30-45 minutes of aerobic exercise at least 4 times per  week. °· Losing weight if necessary. °· Not smoking. °· Limiting alcoholic beverages. °· Learning ways to reduce stress. °Your health care provider may prescribe medicine if lifestyle changes are not enough to get your blood pressure under control, and if one of the following is true: °· You are 18-59 years of age and your systolic blood pressure is above 140. °· You are 60 years of age or older, and your systolic blood pressure is above 150. °· Your diastolic blood pressure is above 90. °· You have diabetes, and your systolic blood pressure is over 140 or your diastolic blood pressure is over 90. °· You have kidney disease and your blood pressure is above 140/90. °· You have heart disease and your blood pressure is above 140/90. °Your personal target blood pressure may vary depending on your medical conditions, your age, and other factors. °HOME CARE INSTRUCTIONS °· Have your blood pressure rechecked as directed by your health care provider.   °· Take medicines only as directed by your health care provider. Follow the directions carefully. Blood pressure medicines must be taken as prescribed. The medicine does not work as well when you skip doses. Skipping doses also puts you at risk for problems. °· Do not smoke.   °· Monitor your blood pressure at home as directed by your health care provider.  °SEEK MEDICAL CARE IF:  °· You think you are having a reaction to medicines taken. °· You have recurrent headaches or feel dizzy. °· You have swelling in your ankles. °· You have trouble with your vision. °SEEK IMMEDIATE MEDICAL CARE IF: °· You develop a severe headache or confusion. °· You have unusual weakness, numbness, or feel faint. °· You have severe chest or abdominal pain. °· You vomit repeatedly. °· You have trouble breathing. °MAKE SURE YOU:  °· Understand these instructions. °· Will watch your condition. °· Will get help right away if you are not doing well or get worse. °  °This information is not intended to  replace advice given to you by your health care provider. Make sure you discuss any questions you have with your health care provider. °  °Document Released: 09/09/2005 Document Revised: 01/24/2015 Document Reviewed: 07/02/2013 °Elsevier Interactive Patient Education ©2016 Elsevier Inc. ° °

## 2015-10-03 NOTE — Addendum Note (Signed)
Addended by: Prescott GumLAND, Jonah Gingras M on: 10/03/2015 10:38 AM   Modules accepted: Kipp BroodSmartSet

## 2015-10-04 LAB — BMP8+EGFR
BUN/Creatinine Ratio: 10 (ref 9–20)
BUN: 9 mg/dL (ref 6–24)
CHLORIDE: 99 mmol/L (ref 96–106)
CO2: 25 mmol/L (ref 18–29)
Calcium: 9.4 mg/dL (ref 8.7–10.2)
Creatinine, Ser: 0.87 mg/dL (ref 0.76–1.27)
GFR calc Af Amer: 117 mL/min/{1.73_m2} (ref >59)
GFR calc non Af Amer: 101 mL/min/{1.73_m2} (ref >59)
GLUCOSE: 104 mg/dL — AB (ref 65–99)
POTASSIUM: 3.9 mmol/L (ref 3.5–5.2)
SODIUM: 141 mmol/L (ref 134–144)

## 2015-10-09 ENCOUNTER — Other Ambulatory Visit: Payer: Self-pay | Admitting: Orthopedic Surgery

## 2015-10-09 DIAGNOSIS — M25531 Pain in right wrist: Secondary | ICD-10-CM

## 2015-10-11 ENCOUNTER — Ambulatory Visit: Payer: Medicaid Other | Admitting: Cardiovascular Disease

## 2015-10-12 ENCOUNTER — Ambulatory Visit (INDEPENDENT_AMBULATORY_CARE_PROVIDER_SITE_OTHER): Payer: Medicaid Other | Admitting: Cardiovascular Disease

## 2015-10-12 ENCOUNTER — Encounter: Payer: Self-pay | Admitting: Cardiovascular Disease

## 2015-10-12 VITALS — BP 168/120 | HR 62 | Ht 67.0 in | Wt 347.0 lb

## 2015-10-12 DIAGNOSIS — I2510 Atherosclerotic heart disease of native coronary artery without angina pectoris: Secondary | ICD-10-CM

## 2015-10-12 DIAGNOSIS — Z9861 Coronary angioplasty status: Secondary | ICD-10-CM

## 2015-10-12 DIAGNOSIS — I10 Essential (primary) hypertension: Secondary | ICD-10-CM

## 2015-10-12 MED ORDER — SILDENAFIL CITRATE 100 MG PO TABS: 100.0000 mg | ORAL_TABLET | Freq: Every day | ORAL | Status: DC | PRN

## 2015-10-12 MED ORDER — SPIRONOLACTONE 25 MG PO TABS: 25.0000 mg | ORAL_TABLET | Freq: Every day | ORAL | Status: DC

## 2015-10-12 NOTE — Patient Instructions (Addendum)
Medication Instructions:  START Aldactone (spironolactone) 25 mg once daily - take in the morning You may take Viagra 100 mg as needed - do not take with nitroglycerin   Labwork: Your physician recommends that you return for lab work in: 1 week for basic metabolic panel   Testing/Procedures: None Ordered   Follow-Up: Your physician recommends that you schedule a follow-up appointment in: 1 month for nurse visit/BP Check on a day Dr. Elease Hashimoto is in office   Your physician wants you to follow-up in: 6 months with Dr. Elease Hashimoto.  You will receive a reminder letter in the mail two months in advance. If you don't receive a letter, please call our office to schedule the follow-up appointment.   If you need a refill on your cardiac medications before your next appointment, please call your pharmacy.   Thank you for choosing [facility]! Eligha Bridegroom, RN [phone]

## 2015-10-12 NOTE — Progress Notes (Signed)
Cardiology Office Note   Date:  10/12/2015   ID:  Chris Powell, DOB [birth year], MRN [id number]  PCP:  Chris Rodney, FNP  Cardiologist:  Chris Powell, M.D.  Chief Complaint: High blood pressure  Problem list: 1. Coronary artery disease-status post PTCA and stenting 2. Hypertension 3. COPD 4. Obstructive sleep apnea    History of Present Illness: Chris Powell is a 50 y.o. male who presents for post hospital follow-up. He was admitted to the hospital with NSTEMI 12/21/14 treated with drug-eluting stent to the LAD. Echo showed moderate LVH EF 55-65%. He had hypokalemia that was treated. He also has history of COPD, hypertension, obstructive sleep apnea on CPap.  Patient comes in today for follow-up. His blood pressure is elevated. He thinks it's related to allergies. He is doing cardiac rehabilitation at [facility] and has lost 9 pounds. He is having trouble quitting smoking. He is down to half a pack a day but it is very hard for him. He is trying to use nicotine patches. Overall he feels so much better. He denies chest pain, palpitations, dyspnea, dyspnea on exertion, dizziness or presyncope.  Jan. 19, 2017:  Doing well.  Is not sleeping well at night. Has to urinate all night  - 3-5 times a night.    Past Medical History  Diagnosis Date  . Hypertension   . COPD (chronic obstructive pulmonary disease) (HCC)   . Asthma   . Arthritis   . Coronary artery disease   . Sleep apnea     USES CPAP  . Shortness of breath dyspnea   . Pneumonia     HX OF PNA  . Depression   . GERD (gastroesophageal reflux disease)   . Myocardial infarction [facility])     Past Surgical History  Procedure Laterality Date  . Knee surgery    . Coronary stent placement  12/22/2014    LAD  . Cardiac catheterization  12/22/2014  . Left heart catheterization with coronary angiogram N/A 12/22/2014    Procedure: LEFT HEART CATHETERIZATION WITH CORONARY ANGIOGRAM;  Surgeon: Chris Crafts, MD;   Location: [facility] CATH LAB;  Service: Cardiovascular;  Laterality: N/A;     Current Outpatient Prescriptions  Medication Sig Dispense Refill  . amLODipine (NORVASC) 10 MG tablet Take 1 tablet (10 mg total) by mouth daily. 90 tablet 1  . aspirin 81 MG chewable tablet Chew 1 tablet (81 mg total) by mouth daily.    Marland Kitchen atorvastatin (LIPITOR) 80 MG tablet Take 1 tablet (80 mg total) by mouth daily at 6 PM. (Patient taking differently: Take 40 mg by mouth daily at 6 PM. ) 30 tablet 11  . Cholecalciferol 2000 UNITS CAPS Take by mouth.    . cloNIDine (CATAPRES) 0.3 MG tablet Take 0.3 mg by mouth 3 (three) times daily.    . cyclobenzaprine (FLEXERIL) 10 MG tablet Take 1 tablet (10 mg total) by mouth 3 (three) times daily as needed for muscle spasms. 60 tablet 3  . furosemide (LASIX) 40 MG tablet Take 1 tablet (40 mg total) by mouth daily. 30 tablet 11  . gabapentin (NEURONTIN) 300 MG capsule Take 1 capsule (300 mg total) by mouth at bedtime. 30 capsule 0  . hydrALAZINE (APRESOLINE) 25 MG tablet Take 1 tablet (25 mg total) by mouth 3 (three) times daily. 90 tablet 3  . ibuprofen (ADVIL,MOTRIN) 800 MG tablet Take 1 tablet (800 mg total) by mouth every 8 (eight) hours as needed. 30 tablet 1  . lisinopril (PRINIVIL,ZESTRIL)  40 MG tablet Take 2 tablets (80 mg total) by mouth daily.    . metoprolol succinate (TOPROL-XL) 50 MG 24 hr tablet Take 1 tablet (50 mg total) by mouth daily. Take with or immediately following a meal. 90 tablet 3  . mometasone (NASONEX) 50 MCG/ACT nasal spray Place 2 sprays into the nose daily. 17 g 12  . nicotine (NICODERM CQ - DOSED IN MG/24 HOURS) 14 mg/24hr patch Place 14 mg onto the skin daily. Reported on 10/03/2015    . nitroGLYCERIN (NITROSTAT) 0.4 MG SL tablet Place 1 tablet (0.4 mg total) under the tongue every 5 (five) minutes as needed for chest pain. 25 tablet 2  . omeprazole (PRILOSEC) 20 MG capsule Take 20 mg by mouth daily.    Marland Kitchen oxycodone (OXY-IR) 5 MG capsule Take 10 mg by  mouth 3 (three) times daily as needed. Reported on 09/27/2015    . potassium chloride SA (K-DUR,KLOR-CON) 20 MEQ tablet Take 1 tablet (20 mEq total) by mouth 2 (two) times daily. 30 tablet 11  . prasugrel (EFFIENT) 10 MG TABS tablet Take 1 tablet (10 mg total) by mouth daily. 30 tablet 1  . tiotropium (SPIRIVA) 18 MCG inhalation capsule Place 18 mcg into inhaler and inhale daily as needed (Pt states he does not use daily but uses as he feels he needs it.).    Marland Kitchen Vitamin D, Ergocalciferol, (DRISDOL) 50000 UNITS CAPS capsule Take 1 capsule (50,000 Units total) by mouth every 7 (seven) days. 12 capsule 3   No current facility-administered medications for this visit.    Allergies:   Review of patient's allergies indicates no known allergies.    Social History:  The patient  reports that he has been smoking Cigarettes.  He has a 15 pack-year smoking history. He has never used smokeless tobacco. He reports that he drinks alcohol. He reports that he uses illicit drugs (Cocaine).   Family History:  The patient's family history includes Cancer in his mother; Heart attack in his father.    ROS:  Please see the history of present illness.   Otherwise, review of systems are positive for none.   All other systems are reviewed and negative.    PHYSICAL EXAM: VS:  BP 168/120 mmHg  Pulse 62  Ht  (1.702 m)  Wt 347 lb (157.398 kg)  BMI 54.34 kg/m2 , BMI Body mass index is 54.34 kg/(m^2). GEN: Obese, well developed, in no acute distress Neck: no JVD, HJR, carotid bruits, or masses Cardiac: RRR; distant heart sounds, no murmurs,gallop, rubs, thrill or heave,  Respiratory:  clear to auscultation bilaterally, normal work of breathing GI: soft, nontender, nondistended, + BS MS: no deformity or atrophy Extremities: Right arm at cath site without hematoma or hemorrhage, good radial and brachial pulses, lower extremities without cyanosis, clubbing, edema, good distal pulses bilaterally.  Skin: warm and dry,  no rash Neuro:  Strength and sensation are intact    EKG:  EKG is ordered today. The ekg ordered today demonstrates normal sinus rhythm with nonspecific T-wave abnormality, no acute change   Recent Labs: 01/20/2015: B Natriuretic Peptide 55.0 01/25/2015: Hemoglobin 15.4; Magnesium 2.2; Platelets 236 08/29/2015: ALT 22 10/03/2015: BUN 9; Creatinine, Ser 0.87; Potassium 3.9; Sodium 141    Lipid Panel    Component Value Date/Time   CHOL 149 08/29/2015 1107   CHOL 113 01/25/2015 0015   TRIG 149 08/29/2015 1107   HDL 32* 08/29/2015 1107   HDL 33* 01/25/2015 0015   CHOLHDL 4.7 08/29/2015  1107   CHOLHDL 3.4 01/25/2015 0015   VLDL 16 01/25/2015 0015   LDLCALC 87 08/29/2015 1107   LDLCALC 64 01/25/2015 0015      Wt Readings from Last 3 Encounters:  10/12/15 347 lb (157.398 kg)  10/03/15 352 lb 9.6 oz (159.938 kg)  09/12/15 346 lb 11.2 oz (157.262 kg)      Other studies Reviewed: Additional studies/ records that were reviewed today include and review of the records demonstrates:  2-D echo 12/23/14 Study Conclusions  - Left ventricle: The cavity size was normal. Wall thickness was   increased in a pattern of moderate LVH. Systolic function was   normal. The estimated ejection fraction was in the range of 55%   to 65%. - Left atrium: The atrium was moderately dilated.  Cardiac catheterization 12/22/14 IMPRESSIONS:    1. Widely patent  left main coronary artery. 2. Severe disease in the mid  left anterior descending artery with moderate disease further distal. The 95% stenosis was treated with a 2.5 x 24 Synergy drug-eluting stent, postdilated with a 3.0 noncompliant balloon. 3. Mild to moderate disease in the  left circumflex artery and its branches. 4. Mild to moderate disease in the right coronary artery.  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 5. Left ventricular systolic function not assessed . Elevated LVEDP 26 mmHg.    Overall  difficult cardiac cath from the right radial approach due to difficulty in locating the left main and RCA. He has a short ascending aorta.  1. CAD:   No angina , doing well.    2. Essential HTN:  BP remains very elevated. I suspect that this may be because he is not sleeping well at night. He has to get up to go to the bathroom quite a few times. Is not using cocaine.     He takes Lasix 40 mg every morning but really has minimal urinary output following that. Will add Aldactone 25 mg a day .   Check BMP in 1 week. I've stressed to him that the permanent solution to his BP. Will have return for a nurse visit in 1 month.  2. ED:   At his request, we have refilled his Viagra.  He understands that he cannot take it if he has taken NTG       Signed, Breyer Tejera, Deloris Ping, MD  10/12/2015 11:16 AM    [facility] [address] [place], [place], [place]  [address] Phone: [phone]; Fax: [phone]

## 2015-10-17 ENCOUNTER — Ambulatory Visit
Admission: RE | Admit: 2015-10-17 | Discharge: 2015-10-17 | Disposition: A | Discharge status: Home / Self Care | Payer: Medicaid Other | Source: Ambulatory Visit | Attending: Orthopedic Surgery | Admitting: Orthopedic Surgery

## 2015-10-17 DIAGNOSIS — M25531 Pain in right wrist: Secondary | ICD-10-CM

## 2015-10-17 MED ORDER — IOHEXOL 180 MG/ML  SOLN: 4.0000 mL | Freq: Once | INTRAMUSCULAR | Status: DC | PRN

## 2015-10-19 ENCOUNTER — Other Ambulatory Visit (INDEPENDENT_AMBULATORY_CARE_PROVIDER_SITE_OTHER): Payer: Medicaid Other | Admitting: *Deleted

## 2015-10-19 DIAGNOSIS — I10 Essential (primary) hypertension: Secondary | ICD-10-CM | POA: Diagnosis not present

## 2015-10-19 DIAGNOSIS — I2510 Atherosclerotic heart disease of native coronary artery without angina pectoris: Secondary | ICD-10-CM

## 2015-10-19 LAB — BASIC METABOLIC PANEL
BUN: 11 mg/dL (ref 7–25)
CALCIUM: 9.4 mg/dL (ref 8.6–10.3)
CO2: 22 mmol/L (ref 20–31)
CREATININE: 1.12 mg/dL (ref 0.60–1.35)
Chloride: 101 mmol/L (ref 98–110)
GLUCOSE: 96 mg/dL (ref 65–99)
Potassium: 3.8 mmol/L (ref 3.5–5.3)
Sodium: 138 mmol/L (ref 135–146)

## 2015-10-19 NOTE — Addendum Note (Signed)
Addended by: Tonita Phoenix on: 10/19/2015 10:18 AM   Modules accepted: Orders

## 2015-10-25 ENCOUNTER — Encounter: Payer: Self-pay | Admitting: Nurse Practitioner

## 2015-10-25 ENCOUNTER — Telehealth: Payer: Self-pay | Admitting: Cardiovascular Disease

## 2015-10-25 NOTE — Telephone Encounter (Signed)
Spoke with patient who states he needs a letter from Dr. Elease Hashimoto stating that he cannot stop his blood thinner for 1 year after DES which was placed 12/22/14.  He requests that I mail the letter.  I agreed and advised him to call back with questions or concerns.

## 2015-10-25 NOTE — Telephone Encounter (Signed)
Please call,he needs a letter stating that he can not come off his blood thinner.He needs injections in his back,but he can not stop his blood thinner.

## 2015-11-01 ENCOUNTER — Telehealth: Payer: Self-pay | Admitting: Cardiovascular Disease

## 2015-11-01 NOTE — Telephone Encounter (Signed)
The referral has been signed and placed in HIM for faxing

## 2015-11-01 NOTE — Telephone Encounter (Signed)
New message     FYI Pt want to go to cardiac rehab in [place].  Calling to let the doctor know they will be sending a referral for him to sign.

## 2015-11-02 ENCOUNTER — Telehealth: Payer: Self-pay | Admitting: Family

## 2015-11-02 DIAGNOSIS — M25519 Pain in unspecified shoulder: Secondary | ICD-10-CM

## 2015-11-03 NOTE — Telephone Encounter (Signed)
Referral for what? Test or to see a provider? And what is wrong with shoulder?

## 2015-11-03 NOTE — Telephone Encounter (Signed)
Spoke with patient, he has seen Dr. Merlyn Lot because of pain in his right shoulder.  He states he started to slip and fall a while back and he threw his arms up to balance himself.  Since then he has been having pain in this right shoulder.  Dr Merlyn Lot had sent him for physical therapy and there were several positions he was unable to get into because of the pain in his shoulder.  Dr. Merlyn Lot had told him he wanted to send him to a "shoulder specialist" but he cannot do the referral because he is not the patient's PCP.  The patient is unsure of what doctor they wanted to refer him to.  The patient is going to contact Dr. Merrilee Seashore office and have them fax over to the attention of Baruch Gouty their notes and recommendation of the doctor they would like him to see.  This information has been passed along to [employer] so she will be aware when she receives this fax.

## 2015-11-07 ENCOUNTER — Other Ambulatory Visit: Payer: Self-pay | Admitting: Family

## 2015-11-07 NOTE — Telephone Encounter (Signed)
Ortho referral ordered.

## 2015-11-07 NOTE — Telephone Encounter (Signed)
Pt aware referral ordered. 

## 2015-11-16 ENCOUNTER — Telehealth: Payer: Self-pay

## 2015-11-16 NOTE — Telephone Encounter (Signed)
Stp and he has appt scheduled tomorrow with [facility], advised pt to call and confirm his appt. Pt voiced understanding.

## 2015-11-16 NOTE — Telephone Encounter (Signed)
Dr [facility] has declined seeing this patient

## 2015-11-16 NOTE — Telephone Encounter (Signed)
Please let patent know that [facility] has declined seeing this patient.

## 2015-11-17 ENCOUNTER — Other Ambulatory Visit: Payer: Self-pay | Admitting: Sports Medicine

## 2015-11-17 DIAGNOSIS — M75101 Unspecified rotator cuff tear or rupture of right shoulder, not specified as traumatic: Secondary | ICD-10-CM

## 2015-11-20 ENCOUNTER — Telehealth: Payer: Self-pay | Admitting: Cardiovascular Disease

## 2015-11-20 NOTE — Telephone Encounter (Signed)
Spoke with patient and reviewed Dr. Harvie Bridge advice.  I advised patient to call back or have dentist call or fax clearance if needed.  He verbalized understanding and agreement.

## 2015-11-20 NOTE — Telephone Encounter (Signed)
If the tooth is already loose, I doubt that it will bleed much. It has been about a year so he can hold for 7 days if the dentist recommneds

## 2015-11-20 NOTE — Telephone Encounter (Signed)
New message   Pt called returning rn call

## 2015-11-20 NOTE — Telephone Encounter (Signed)
Left message for patient to call back  

## 2015-11-20 NOTE — Telephone Encounter (Signed)
New message      Pt is on effient.  He needs to have an extraction.  Will he be abe to get it pulled?

## 2015-11-20 NOTE — Telephone Encounter (Signed)
Spoke with patient who states he has a wisdom tooth that is now loose and needs to be pulled.  He wants to know if he can hold effient.  I advised that the dentist usually sends a clearance form for permission to hold blood thinners if necessary for extraction.  He states he would like to have this information prior to going to dentist appointment so that he doesn't have to make 2 different appointments.  Pt had DES on 12/21/14.  I advised him that I will discuss with Dr. Elease Hashimoto who is in the office and will call him back.

## 2015-11-21 ENCOUNTER — Ambulatory Visit: Payer: Medicaid Other | Admitting: Pharmacist

## 2015-11-22 ENCOUNTER — Ambulatory Visit (INDEPENDENT_AMBULATORY_CARE_PROVIDER_SITE_OTHER): Payer: Medicaid Other | Admitting: Pharmacist

## 2015-11-22 ENCOUNTER — Encounter: Payer: Self-pay | Admitting: Pharmacist

## 2015-11-22 VITALS — BP 152/102 | HR 80 | Wt 345.5 lb

## 2015-11-22 DIAGNOSIS — I10 Essential (primary) hypertension: Secondary | ICD-10-CM

## 2015-11-22 DIAGNOSIS — F17200 Nicotine dependence, unspecified, uncomplicated: Secondary | ICD-10-CM

## 2015-11-22 DIAGNOSIS — Z720 Tobacco use: Secondary | ICD-10-CM | POA: Diagnosis not present

## 2015-11-22 LAB — BASIC METABOLIC PANEL
BUN: 10 mg/dL (ref 7–25)
CHLORIDE: 104 mmol/L (ref 98–110)
CO2: 24 mmol/L (ref 20–31)
CREATININE: 0.95 mg/dL (ref 0.60–1.35)
Calcium: 9.1 mg/dL (ref 8.6–10.3)
Glucose, Bld: 89 mg/dL (ref 65–99)
Potassium: 4 mmol/L (ref 3.5–5.3)
Sodium: 138 mmol/L (ref 135–146)

## 2015-11-22 MED ORDER — LISINOPRIL 40 MG PO TABS: 40.0000 mg | ORAL_TABLET | Freq: Every day | ORAL | Status: DC

## 2015-11-22 MED ORDER — SPIRONOLACTONE 50 MG PO TABS: 50.0000 mg | ORAL_TABLET | Freq: Every day | ORAL | Status: DC

## 2015-11-22 MED ORDER — HYDRALAZINE HCL 50 MG PO TABS: 50.0000 mg | ORAL_TABLET | Freq: Three times a day (TID) | ORAL | Status: DC

## 2015-11-22 NOTE — Patient Instructions (Addendum)
Increase spironolactone to  daily - you can take 2 of your  tablets until you run out. I will send a prescription for the higher dose to your pharmacy.  Cut back on your lisinopril to 1 tablet of the  in the evening.  Increase your dose of hydralazine to  3 times a day.  Bring your afternoon doses of clonidine and hydralazine with you when you are out during the day.  Cut back on your ibuprofen as much as you can - the more you take, the higher your blood pressure can get.  Check with the pharmacy to see if they can fill for a different flavor of nicotine gum to help you quit smoking. Keep your patch on until you go to bed at night.  Try to eat smaller, more frequent meals throughout the day.  Blood pressure check again in 3 weeks.

## 2015-11-22 NOTE — Progress Notes (Signed)
Patient ID: Chris Powell                 DOB: May 29, 1966, 50 yo                         MRN: [id number]     HPI: Chris Powell is a 50 y.o. male referred by Dr. Elease Hashimoto to HTN clinic. [facility] is significant for NSTEMI in March 2016 s/p PCI with DES, COPD, HTN, and OSA on CPAP. Pt has a history of HTN that is difficult to control. He was seen by Dr. Elease Hashimoto in January and spironolactone was added as his 6th BP medication. He presents today for f/u for his HTN.  Pt reports that he has been using ibuprofen  TID about 3x/week. He also reports that he misses the middle dose of hydralazine and clonidine about 5x/week. He has been trying to quit smoking and is down to about 5 cigarettes a day. He takes his patch off around 2pm when he smokes a cigarette and does not like the flavor of the nicorette gum he has been getting. He is also taking his potassium daily rather than BID as prescribed.  Current HTN meds: amlodipine  daily, clonidine 0.3mg  TID, Lasix  daily, hydralazine  TID, lisinopril  BID, spironolactone , Toprol  daily Previously tried: n/a BP goal: <140/73mmHg  Family History: Cancer in his mother, heart attack in his father.  Social History: Patient reports that he has been smoking cigarettes. He has a 15 pack year smoking history. He reports that he drinks alcohol and uses illicit drugs (cocaine).  Diet: Does not eat breakfast or lunch. First and only meal around 2pm. Cut out starches, eating more protein and vegetables. Wants to lose weight.   Exercise: Cardiac rehab starting in a few weeks.  Home BP readings: Does not have a cuff to check his BP at home.  Wt Readings from Last 3 Encounters:  10/12/15 347 lb (157.398 kg)  10/03/15 352 lb 9.6 oz (159.938 kg)  09/12/15 346 lb 11.2 oz (157.262 kg)   BP Readings from Last 3 Encounters:  10/12/15 168/120  10/03/15 133/82  09/12/15 149/94   Pulse Readings from Last 3 Encounters:  10/12/15 62  10/03/15 77   09/12/15 67    Renal function: CrCl cannot be calculated (Unknown ideal weight.).  Past Medical History  Diagnosis Date  . Hypertension   . COPD (chronic obstructive pulmonary disease) (HCC)   . Asthma   . Arthritis   . Coronary artery disease   . Sleep apnea     USES CPAP  . Shortness of breath dyspnea   . Pneumonia     HX OF PNA  . Depression   . GERD (gastroesophageal reflux disease)   . Myocardial infarction [facility])     Current Outpatient Prescriptions on File Prior to Visit  Medication Sig Dispense Refill  . amLODipine (NORVASC) 10 MG tablet Take 1 tablet (10 mg total) by mouth daily. 90 tablet 1  . aspirin 81 MG chewable tablet Chew 1 tablet (81 mg total) by mouth daily.    Marland Kitchen atorvastatin (LIPITOR) 80 MG tablet Take 1 tablet (80 mg total) by mouth daily at 6 PM. (Patient taking differently: Take 40 mg by mouth daily at 6 PM. ) 30 tablet 11  . Cholecalciferol 2000 UNITS CAPS Take by mouth.    . cloNIDine (CATAPRES) 0.3 MG tablet Take 0.3 mg by mouth 3 (three) times daily.    Marland Kitchen  cyclobenzaprine (FLEXERIL) 10 MG tablet Take 1 tablet (10 mg total) by mouth 3 (three) times daily as needed for muscle spasms. 60 tablet 3  . furosemide (LASIX) 40 MG tablet Take 1 tablet (40 mg total) by mouth daily. 30 tablet 11  . gabapentin (NEURONTIN) 300 MG capsule Take 1 capsule (300 mg total) by mouth at bedtime. 30 capsule 0  . hydrALAZINE (APRESOLINE) 25 MG tablet Take 1 tablet (25 mg total) by mouth 3 (three) times daily. 90 tablet 3  . ibuprofen (ADVIL,MOTRIN) 800 MG tablet Take 1 tablet (800 mg total) by mouth every 8 (eight) hours as needed. 30 tablet 1  . lisinopril (PRINIVIL,ZESTRIL) 40 MG tablet Take 2 tablets (80 mg total) by mouth daily.    . metoprolol succinate (TOPROL-XL) 50 MG 24 hr tablet Take 1 tablet (50 mg total) by mouth daily. Take with or immediately following a meal. 90 tablet 3  . mometasone (NASONEX) 50 MCG/ACT nasal spray Place 2 sprays into the nose daily. 17 g 12    . nicotine (NICODERM CQ - DOSED IN MG/24 HOURS) 14 mg/24hr patch Place 14 mg onto the skin daily. Reported on 10/03/2015    . nitroGLYCERIN (NITROSTAT) 0.4 MG SL tablet Place 1 tablet (0.4 mg total) under the tongue every 5 (five) minutes as needed for chest pain. 25 tablet 2  . omeprazole (PRILOSEC) 20 MG capsule Take 20 mg by mouth daily.    Marland Kitchen oxycodone (OXY-IR) 5 MG capsule Take 10 mg by mouth 3 (three) times daily as needed. Reported on 09/27/2015    . potassium chloride SA (K-DUR,KLOR-CON) 20 MEQ tablet Take 1 tablet (20 mEq total) by mouth 2 (two) times daily. 30 tablet 11  . prasugrel (EFFIENT) 10 MG TABS tablet Take 1 tablet (10 mg total) by mouth daily. 30 tablet 1  . sildenafil (VIAGRA) 100 MG tablet Take 1 tablet (100 mg total) by mouth daily as needed for erectile dysfunction. 10 tablet 6  . spironolactone (ALDACTONE) 25 MG tablet Take 1 tablet (25 mg total) by mouth daily. 30 tablet 11  . tiotropium (SPIRIVA) 18 MCG inhalation capsule Place 18 mcg into inhaler and inhale daily as needed (Pt states he does not use daily but uses as he feels he needs it.).    Marland Kitchen Vitamin D, Ergocalciferol, (DRISDOL) 50000 UNITS CAPS capsule Take 1 capsule (50,000 Units total) by mouth every 7 (seven) days. 12 capsule 3   No current facility-administered medications on file prior to visit.    No Known Allergies   Assessment/Plan:  1. Hypertension - BP above goal ,140/33mmHg at 152/102. Will increase spironolactone to  daily and increase hydralazine to  TID. Will cut back lisinopril from  to  daily d/t lack of extra efficacy but more side effects with higher dose. Pt will work on bringing his afternoon doses of clonidine and hydralazine with him when he is out for the day. He will try to cut back on his ibuprofen use. He will try to eat smaller more frequent meals rather than 1 large meal a day and will become more active with cardiac rehab soon. Checking BMET today. Will f/u in BP clinic in  3 weeks.  2. Smoking cessation - Pt has cut back to ~5 cigarettes a day. He has been taking his patch off at 2pm when he smokes and not applying another one until the next morning. Advised him to keep it on until he goes to bed. He will also ask about a different  flavor of nicotine gum that he would actually use. Encouraged him to keep up his efforts with smoking cessation.    Kinzley Savell E. Anthonia Monger, PharmD, CPP [facility] [address] [place], [place] [address] Phone: [phone]; Fax: [phone] 11/22/2015 11:52 AM

## 2015-11-26 ENCOUNTER — Ambulatory Visit
Admission: RE | Admit: 2015-11-26 | Discharge: 2015-11-26 | Disposition: A | Discharge status: Home / Self Care | Payer: Medicaid Other | Source: Ambulatory Visit | Attending: Sports Medicine | Admitting: Sports Medicine

## 2015-11-26 DIAGNOSIS — M75101 Unspecified rotator cuff tear or rupture of right shoulder, not specified as traumatic: Secondary | ICD-10-CM

## 2015-11-27 NOTE — Telephone Encounter (Signed)
spoke with dentist office, will fax this note to [phone].

## 2015-12-04 NOTE — Therapy (Signed)
[facility] [place], [place], [address] Phone: [phone]   Fax:  [phone]  Physical Therapy Treatment  Patient Details  Name: Chris Powell MRN: [id number] Date of Birth: 1966-08-12 No Data Recorded  Encounter Date: 07/06/2015    Past Medical History  Diagnosis Date  . Hypertension   . COPD (chronic obstructive pulmonary disease) ([place])   . Asthma   . Arthritis   . Coronary artery disease   . Sleep apnea     USES CPAP  . Shortness of breath dyspnea   . Pneumonia     HX OF PNA  . Depression   . GERD (gastroesophageal reflux disease)   . Myocardial infarction [facility])     Past Surgical History  Procedure Laterality Date  . Knee surgery    . Coronary stent placement  12/22/2014    LAD  . Cardiac catheterization  12/22/2014  . Left heart catheterization with coronary angiogram N/A 12/22/2014    Procedure: LEFT HEART CATHETERIZATION WITH CORONARY ANGIOGRAM;  Surgeon: Jettie Booze, MD;  Location: [facility] C[facility] LAB;  Service: Cardiovascular;  Laterality: N/A;    There were no vitals filed for this visit.  Visit Diagnosis:  Chronic left-sided low back pain with left-sided sciatica - Plan: PT plan of care cert/re-cert                                    PT Long Term Goals - 07/06/15 1014    PT LONG TERM GOAL #1   Title Evaluation only.               Problem List Patient Active Problem List   Diagnosis Date Noted  . Metabolic syndrome 88/87/5797  . Abdominal wall pain 08/15/2015  . Right wrist pain 07/13/2015  . GERD (gastroesophageal reflux disease) 02/28/2015  . Vitamin D deficiency 02/28/2015  . Hyperlipidemia 02/28/2015  . Chronic knee pain 02/28/2015  . Cocaine abuse 01/25/2015  . NSTEMI (non-ST elevated myocardial infarction) ([place]) 01/24/2015  . CAD - S/P LAD DES 3/11/22/14 12/23/2014  . Morbid obesity-BMI 51 12/23/2014  . COPD (chronic obstructive pulmonary  disease) ([place]) 12/23/2014  . HTN (hypertension) 12/23/2014  . Smoking 12/23/2014  . Non-sustained ventricular tachycardia ([place]) 12/23/2014  . Hypokalemia 12/23/2014  . Acute coronary syndrome ([place]) 12/22/2014   PHYSICAL THERAPY DISCHARGE SUMMARY  Visits from Start of Care: 1  Current functional level related to goals / functional outcomes: Please see above.   Remaining deficits: Eval only.   Education / Equipment:  Plan: Patient agrees to discharge.  Patient goals were met. Patient is being discharged due to meeting the stated rehab goals.  ?????      Akacia Boltz, [place] MPT 12/04/2015, 5:18 PM  [facility] [address] [place], [place], [address] Phone: [phone]   Fax:  [phone]  Name: DARRAGH NAY MRN: [id number] Date of Birth: July 10, 1966

## 2015-12-05 ENCOUNTER — Encounter (HOSPITAL_COMMUNITY)
Admission: RE | Admit: 2015-12-05 | Discharge: 2015-12-05 | Disposition: A | Discharge status: Home / Self Care | Payer: Medicaid Other | Source: Ambulatory Visit | Attending: Cardiovascular Disease | Admitting: Cardiovascular Disease

## 2015-12-05 ENCOUNTER — Encounter (HOSPITAL_COMMUNITY): Payer: Self-pay

## 2015-12-05 VITALS — BP 140/80 | HR 94 | Ht 67.0 in | Wt 347.0 lb

## 2015-12-05 DIAGNOSIS — I2510 Atherosclerotic heart disease of native coronary artery without angina pectoris: Secondary | ICD-10-CM | POA: Diagnosis present

## 2015-12-05 DIAGNOSIS — I214 Non-ST elevation (NSTEMI) myocardial infarction: Secondary | ICD-10-CM | POA: Insufficient documentation

## 2015-12-05 DIAGNOSIS — Z955 Presence of coronary angioplasty implant and graft: Secondary | ICD-10-CM | POA: Diagnosis present

## 2015-12-05 HISTORY — DX: Hyperlipidemia, unspecified: E78.5

## 2015-12-05 NOTE — Progress Notes (Signed)
Cardiac Individual Treatment Plan  Patient Details  Name: Chris Powell MRN: [id number] Date of Birth: 01/23/1966 Referring Provider:  Vesta Mixer, MD  Initial Encounter Date:       CARDIAC REHAB PHASE II ORIENTATION from 12/05/2015 in [place] CARDIAC REHABILITATION   Date  12/05/15      Visit Diagnosis: NSTEMI (non-ST elevation myocardial infarction) [facility])  Stented coronary artery  Patient's Home Medications on Admission:  Current outpatient prescriptions:  .  amLODipine (NORVASC) 10 MG tablet, Take 1 tablet (10 mg total) by mouth daily., Disp: 90 tablet, Rfl: 1 .  aspirin EC 81 MG tablet, Take 81 mg by mouth daily., Disp: , Rfl:  .  atorvastatin (LIPITOR) 80 MG tablet, Take 1 tablet (80 mg total) by mouth daily at 6 PM., Disp: 30 tablet, Rfl: 11 .  Cholecalciferol 2000 UNITS CAPS, Take 2,000 Units by mouth daily. , Disp: , Rfl:  .  cloNIDine (CATAPRES) 0.3 MG tablet, Take 0.3 mg by mouth 2 (two) times daily. , Disp: , Rfl:  .  cyclobenzaprine (FLEXERIL) 10 MG tablet, Take 1 tablet (10 mg total) by mouth 3 (three) times daily as needed for muscle spasms., Disp: 60 tablet, Rfl: 3 .  furosemide (LASIX) 40 MG tablet, Take 1 tablet (40 mg total) by mouth daily., Disp: 30 tablet, Rfl: 11 .  gabapentin (NEURONTIN) 300 MG capsule, Take 1 capsule (300 mg total) by mouth at bedtime. (Patient taking differently: Take 300 mg by mouth at bedtime as needed (For back pain.). ), Disp: 30 capsule, Rfl: 0 .  hydrALAZINE (APRESOLINE) 50 MG tablet, Take 1 tablet (50 mg total) by mouth 3 (three) times daily. (Patient taking differently: Take 50 mg by mouth 3 (three) times daily. Takes two to three times daily (3 times daily if at home).), Disp: 90 tablet, Rfl: 3 .  ibuprofen (ADVIL,MOTRIN) 800 MG tablet, Take 1 tablet (800 mg total) by mouth every 8 (eight) hours as needed. (Patient taking differently: Take 800 mg by mouth every 8 (eight) hours as needed for moderate pain. ), Disp: 30 tablet, Rfl:  1 .  lisinopril (PRINIVIL,ZESTRIL) 40 MG tablet, Take 1 tablet (40 mg total) by mouth daily. (Patient taking differently: Take 40 mg by mouth 2 (two) times daily. ), Disp: 90 tablet, Rfl: 3 .  metoprolol succinate (TOPROL-XL) 50 MG 24 hr tablet, Take 1 tablet (50 mg total) by mouth daily. Take with or immediately following a meal., Disp: 90 tablet, Rfl: 3 .  mometasone (NASONEX) 50 MCG/ACT nasal spray, Place 2 sprays into the nose daily. (Patient taking differently: Place 2 sprays into the nose daily as needed (Allergies). ), Disp: 17 g, Rfl: 12 .  Nicotine Polacrilex (RA NICOTINE GUM MT), Use as directed 1 each in the mouth or throat as needed (Smoking Cessation)., Disp: , Rfl:  .  nitroGLYCERIN (NITROSTAT) 0.4 MG SL tablet, Place 1 tablet (0.4 mg total) under the tongue every 5 (five) minutes as needed for chest pain., Disp: 25 tablet, Rfl: 2 .  omeprazole (PRILOSEC) 20 MG capsule, Take 20 mg by mouth daily., Disp: , Rfl:  .  oxycodone (OXY-IR) 5 MG capsule, Take 10 mg by mouth 3 (three) times daily as needed. Reported on 09/27/2015, Disp: , Rfl:  .  potassium chloride SA (K-DUR,KLOR-CON) 20 MEQ tablet, Take 1 tablet (20 mEq total) by mouth 2 (two) times daily. (Patient taking differently: Take 20 mEq by mouth daily. ), Disp: 30 tablet, Rfl: 11 .  prasugrel (EFFIENT) 10  MG TABS tablet, Take 1 tablet (10 mg total) by mouth daily., Disp: 30 tablet, Rfl: 1 .  sildenafil (VIAGRA) 100 MG tablet, Take 1 tablet (100 mg total) by mouth daily as needed for erectile dysfunction., Disp: 10 tablet, Rfl: 6 .  spironolactone (ALDACTONE) 50 MG tablet, Take 1 tablet (50 mg total) by mouth daily., Disp: 90 tablet, Rfl: 3 .  tiotropium (SPIRIVA) 18 MCG inhalation capsule, Place 18 mcg into inhaler and inhale daily as needed (Pt states he does not use daily but uses as he feels he needs it.)., Disp: , Rfl:   Past Medical History: Past Medical History  Diagnosis Date  . Hypertension   . COPD (chronic obstructive  pulmonary disease) (HCC)   . Asthma   . Arthritis   . Coronary artery disease   . Sleep apnea     USES CPAP  . Shortness of breath dyspnea   . Pneumonia     HX OF PNA  . Depression   . GERD (gastroesophageal reflux disease)   . Myocardial infarction (HCC)   . Hyperlipidemia     Tobacco Use: History  Smoking status  . Current Every Day Smoker -- 0.50 packs/day for 30 years  . Types: Cigarettes  Smokeless tobacco  . Never Used    Comment: " I have  tried vapor cigarettes"    Labs:     Recent Review Flowsheet Data    Labs for ITP Cardiac and Pulmonary Rehab Latest Ref Rng 12/23/2014 01/24/2015 01/25/2015 02/28/2015 08/29/2015   Cholestrol 100 - 199 mg/dL 536 - 644 034 742   LDLCALC 0 - 99 mg/dL 65 - 64 69 87   HDL >59 mg/dL 56(L) - 87(F) 64(P) 32(R)   Trlycerides 0 - 149 mg/dL 91 - 82 518 841   Hemoglobin A1c 4.8 - 5.6 % 5.8(H) - - - -   TCO2 0 - 100 mmol/L - 24 - - -      Capillary Blood Glucose: No results found for: GLUCAP   Exercise Target Goals: Date: 12/05/15  Exercise Program Goal: Individual exercise prescription set with THRR, safety & activity barriers. Participant demonstrates ability to understand and report RPE using BORG scale, to self-measure pulse accurately, and to acknowledge the importance of the exercise prescription.  Exercise Prescription Goal: Starting with aerobic activity 30 plus minutes a day, 3 days per week for initial exercise prescription. Provide home exercise prescription and guidelines that participant acknowledges understanding prior to discharge.  Activity Barriers & Risk Stratification:     Activity Barriers & Cardiac Risk Stratification - 12/05/15 0845    Activity Barriers & Cardiac Risk Stratification   Activity Barriers --  chronic r and l knee pain   Cardiac Risk Stratification High      6 Minute Walk:     6 Minute Walk      12/05/15 0822       6 Minute Walk   Phase Initial     Distance 1450 feet     Walk Time 6  minutes     # of Rest Breaks 0     MPH 2.75     METS 3.11     RPE 14     Perceived Dyspnea  13     VO2 Peak 11.64     Symptoms Yes (comment)     Comments Chronic knee pain. Patient gave rating of 10 on scale of 1 to 10. Patient stated pain was not exercise limiting.     Resting  HR 94 bpm     Resting BP 140/80 mmHg     Max Ex. HR 121 bpm     Max Ex. BP 172/94 mmHg     Pre/Post BP   Baseline BP 140/80 mmHg     6 Minute BP 172/94 mmHg     2 Minute Post BP 142/88 mmHg     Pre/Post BP? Yes        Initial Exercise Prescription:     Initial Exercise Prescription - 12/05/15 1200    Date of Initial Exercise Prescription   Date 12/05/15   Treadmill   MPH 1.8   Grade 0   Minutes 15   METs 2.38   NuStep   Level 2   Minutes 15   METs 1.5   Arm Ergometer   Level 1.5   Minutes 15   METs 1.5   Prescription Details   Frequency (times per week) 3   Intensity   THRR REST +  30   THRR 40-80% of Max Heartrate 113-132   Ratings of Perceived Exertion 11-13   Progression   Progression Continue to progress workloads to maintain intensity without signs/symptoms of physical distress.   Resistance Training   Training Prescription Yes   Weight 1.0   Reps 10-12      Perform Capillary Blood Glucose checks as needed.  Exercise Prescription Changes:   Discharge Exercise Prescription (Final Exercise Prescription Changes):   Nutrition:  Target Goals: Understanding of nutrition guidelines, daily intake of sodium 1500mg , cholesterol 200mg , calories 30% from fat and 7% or less from saturated fats, daily to have 5 or more servings of fruits and vegetables.  Biometrics:     Pre Biometrics - 12/05/15 1246    Pre Biometrics   Height 5\' 7"  (1.702 m)   Weight (!) 347 lb (157.398 kg)   Waist Circumference 55 inches   Hip Circumference 60 inches   Waist to Hip Ratio 0.92 %   BMI (Calculated) 54.5   Triceps Skinfold 20 mm   % Body Fat 45.8 %   Grip Strength 132 kg   Flexibility 0  in   Single Leg Stand 25 seconds       Nutrition Therapy Plan and Nutrition Goals:     Nutrition Therapy & Goals - 12/05/15 0847    Intervention Plan   Intervention Nutrition handout(s) given to patient.   Expected Outcomes Short Term Goal: Understand basic principles of dietary content, such as calories, fat, sodium, cholesterol and nutrients.;Long Term Goal: Adherence to prescribed nutrition plan.      Nutrition Discharge: Rate Your Plate Scores:     Nutrition Assessments - 12/05/15 0848    MEDFICTS Scores   Pre Score 68      Nutrition Goals Re-Evaluation:   Psychosocial: Target Goals: Acknowledge presence or absence of depression, maximize coping skills, provide positive support system. Participant is able to verbalize types and ability to use techniques and skills needed for reducing stress and depression.  Initial Review & Psychosocial Screening:     Initial Psych Review & Screening - 12/05/15 0851    Initial Review   Current issues with Current Sleep Concerns   Family Dynamics   Good Support System? Yes   Concerns --  none   Barriers   Psychosocial barriers to participate in program There are no identifiable barriers or psychosocial needs.   Screening Interventions   Interventions Encouraged to exercise      Quality of Life Scores:  Quality of Life - 12/05/15 1307    Quality of Life Scores   Health/Function Pre 16.53 %   Socioeconomic Pre 14.06 %   Psych/Spiritual Pre 18.93 %   Family Pre 21.2 %   GLOBAL Pre 17.11 %      PHQ-9:     Recent Review Flowsheet Data    Depression screen [facility]/9 12/05/2015 09/12/2015 07/13/2015 02/28/2015 12/29/2014   Decreased Interest 1 0 1 0 0   Down, Depressed, Hopeless 0 0 1 0 0   PHQ - 2 Score 1 0 2 0 0   Altered sleeping 0 - 2 - -   Tired, decreased energy 1 - 1 - -   Change in appetite 0 - 1 - -   Feeling bad or failure about yourself  0 - 0 - -   Trouble concentrating 0 - 1 - -   Moving slowly or  fidgety/restless 0 - 0 - -   Suicidal thoughts 0 - 0 - -   PHQ-9 Score 2 - 7 - -   Difficult doing work/chores Somewhat difficult - - - -      Psychosocial Evaluation and Intervention:   Psychosocial Re-Evaluation:   Vocational Rehabilitation: Provide vocational rehab assistance to qualifying candidates.   Vocational Rehab Evaluation & Intervention:     Vocational Rehab - 12/05/15 0846    Initial Vocational Rehab Evaluation & Intervention   Assessment shows need for Vocational Rehabilitation No      Education: Education Goals: Education classes will be provided on a weekly basis, covering required topics. Participant will state understanding/return demonstration of topics presented.  Learning Barriers/Preferences:     Learning Barriers/Preferences - 12/05/15 0845    Learning Barriers/Preferences   Learning Barriers None   Learning Preferences Skilled Demonstration      Education Topics: Hypertension, Hypertension Reduction -Define heart disease and high blood pressure. Discus how high blood pressure affects the body and ways to reduce high blood pressure.   Exercise and Your Heart -Discuss why it is important to exercise, the FITT principles of exercise, normal and abnormal responses to exercise, and how to exercise safely.   Angina -Discuss definition of angina, causes of angina, treatment of angina, and how to decrease risk of having angina.   Cardiac Medications -Review what the following cardiac medications are used for, how they affect the body, and side effects that may occur when taking the medications.  Medications include Aspirin, Beta blockers, calcium channel blockers, ACE Inhibitors, angiotensin receptor blockers, diuretics, digoxin, and antihyperlipidemics.   Congestive Heart Failure -Discuss the definition of CHF, how to live with CHF, the signs and symptoms of CHF, and how keep track of weight and sodium intake.   Heart Disease and  Intimacy -Discus the effect sexual activity has on the heart, how changes occur during intimacy as we age, and safety during sexual activity.   Smoking Cessation / COPD -Discuss different methods to quit smoking, the health benefits of quitting smoking, and the definition of COPD.   Nutrition I: Fats -Discuss the types of cholesterol, what cholesterol does to the heart, and how cholesterol levels can be controlled.   Nutrition II: Labels -Discuss the different components of food labels and how to read food label   Heart Parts and Heart Disease -Discuss the anatomy of the heart, the pathway of blood circulation through the heart, and these are affected by heart disease.   Stress I: Signs and Symptoms -Discuss the causes of stress, how stress may  lead to anxiety and depression, and ways to limit stress.   Stress II: Relaxation -Discuss different types of relaxation techniques to limit stress.   Warning Signs of Stroke / TIA -Discuss definition of a stroke, what the signs and symptoms are of a stroke, and how to identify when someone is having stroke.   Knowledge Questionnaire Score:     Knowledge Questionnaire Score - 12/05/15 0846    Knowledge Questionnaire Score   Pre Score 21/28      Personal Goals and Risk Factors at Admission:     Personal Goals and Risk Factors at Admission - 12/05/15 0848    Core Components/Risk Factors/Patient Goals on Admission    Weight Management Yes;Obesity   Intervention Obesity: Provide education and appropriate resources to help participant work on and attain dietary goals.;Weight Management: Provide education and appropriate resources to help participant work on and attain dietary goals.   Admit Weight 347 lb (157.398 kg)   Goal Weight: Short Term 330 lb (149.687 kg)   Expected Outcomes Short Term: Continue to assess and modify interventions until short term weight is achieved.;Long Term: Adherence to nutrition and physical  activity/exercise program aimed toward attainment of established weight goal.   Tobacco Cessation Yes   Number of packs per day 0.5   Intervention Assist the participant in steps to quit. Provide individualized education and counseling about committing to Tobacco Cessation, relapse prevention, and pharmacological support that can be provided by physician.;Education officer, environmental, assist with locating and accessing local/national Quit Smoking programs, and support quit date choice.   Expected Outcomes Short Term: Will demonstrate readiness to quit, by selecting a quit date.   Improve shortness of breath with ADL's Yes   Intervention Provide education, individualized exercise plan and daily activity instruction to help decrease symptoms of SOB with activities of daily living.   Expected Outcomes Short Term: Achieves a reduction of symptoms when performing activities of daily living.   Hypertension Yes   Intervention Provide education on lifestyle modifcations including regular physical activity/exercise, weight management, moderate sodium restriction and increased consumption of fresh fruit, vegetables, and low fat dairy, alcohol moderation, and smoking cessation.;Monitor prescription use compliance.   Expected Outcomes Long Term: Maintenance of blood pressure at goal levels.   Lipids Yes   Intervention Provide education and support for participant on nutrition & aerobic/resistive exercise along with prescribed medications to achieve LDL 70mg , HDL >40mg .   Expected Outcomes Short Term: Participant states understanding of desired cholesterol values and is compliant with medications prescribed. Participant is following exercise prescription and nutrition guidelines.      Personal Goals and Risk Factors Review:    Personal Goals Discharge (Final Personal Goals and Risk Factors Review):    ITP Comments:     ITP Comments      12/05/15 0846           ITP Comments Patient is 50 year old  African American Male who lives with fiance.  Patient enjoys music.  patient still smokes. Patient not interested in smoking cessation classes. Patient does not exhibit any s/s of depression.           Comments:

## 2015-12-05 NOTE — Patient Instructions (Addendum)
Patient arrived for 1st visit/orientation/education at [id number]. Patient was referred to CR by Nahser due to MI (I21.4) and Stent (Z95.5). During orientation advised patient on arrival and appointment times what to wear, what to do before, during and after exercise. Reviewed attendance and class policy. Talked about inclement weather and class consultation policy. Pt is scheduled to return Cardiac Rehab on 12/11/15/at 0930. Pt was advised to come to class 5 minutes before class starts. He was also given instructions on meeting with a Registered Dietician attending the Family Structure classes. Pt is eager to get started. Patient scored 2 on entrance PHQ. Patient was able to complete 6 minute walk test. Patient was measured for the equipment. Discussed equipment safety with patient. Took patient pre-anthropometric measurements. Patient finished visit at 0915.     Pt has finished orientation and is scheduled to return to CR on 12/11/15 at 0930. Pt has been instructed to arrive to class 15 minutes early for scheduled class. Pt has been instructed to wear comfortable clothing and shoes with rubber soles. Pt has been told to take their medications 1 hour prior to coming to class.  If the patient is not going to attend class, he has been instructed to call.

## 2015-12-05 NOTE — Progress Notes (Signed)
Cardiac/Pulmonary Rehab Medication Review by a Pharmacist  Does the patient  feel that his/her medications are working for him/her?  yes  Has the patient been experiencing any side effects to the medications prescribed?  no  Does the patient measure his/her own blood pressure or blood glucose at home?  yes (blood sugar).  Would consider a blood pressure monitor at home.  Does the patient have any problems obtaining medications due to transportation or finances?   No (except limitation of four Viagra per month by [employer])  Understanding of regimen: excellent Understanding of indications: excellent Potential of compliance: good, misses some mid day doses of BP meds due to being at work.  Questions asked to Determine Patient Understanding of Medication Regimen:  1. What is the name of the medication?  2. What is the medication used for?  3. When should it be taken?  4. How much should be taken?  5. How will you take it?  6. What side effects should you report?  Understanding Defined as: Excellent: All questions above are correct Good: Questions 1-4 are correct Fair: Questions 1-2 are correct  Poor: 1 or none of the above questions are correct   Pharmacist comments: Good understanding or meds and regimen.  Some non-compliance with mid day medications due to work.  Mady GemmaHayes, Layla Gramm R 12/05/2015 9:20 AM

## 2015-12-08 ENCOUNTER — Ambulatory Visit (INDEPENDENT_AMBULATORY_CARE_PROVIDER_SITE_OTHER): Payer: Medicaid Other | Admitting: Pharmacist

## 2015-12-08 VITALS — BP 132/88 | HR 73

## 2015-12-08 DIAGNOSIS — I10 Essential (primary) hypertension: Secondary | ICD-10-CM | POA: Diagnosis not present

## 2015-12-08 NOTE — Progress Notes (Signed)
Patient ID: HOLGER SOKOLOWSKI DOB: 01/14/1966, 50 yo MRN: [id number]     HPI: Chris Powell is a 50 y.o. male who presents to HTN clinic for follow up. PMH is significant for NSTEMI in March 2016 s/p PCI with DES, COPD, HTN, and OSA on CPAP. Pt has a history of HTN that is difficult to control. At last HTN visit, spironolactone was increased to , lisinopril was decreased from  BID to daily, and hydralazine was increased from  to  TID.  Pt reports that he has been better at taking his middle dose of hydralazine and clonidine - he has a small pill box that he puts his doses in when he is out for the day. He is working on cutting back his cigarettes - still averaging about 6 a day.  Current HTN meds: amlodipine  daily, clonidine 0.3mg  TID, Lasix  daily, hydralazine  TID, lisinopril  daily, spironolactone , Toprol  daily Previously tried: n/a BP goal: <140/20mmHg Family History: Cancer in his mother, heart attack in his father.  Social History: Patient reports that he has been smoking cigarettes. He has a 15 pack year smoking history. He reports that he drinks alcohol and uses illicit drugs (cocaine).  Diet: Does not eat breakfast or lunch. First and only meal around 2pm. Cut out starches, eating more protein and vegetables. Wants to lose weight.   Exercise: Starting cardiac rehab in 3 days.  Home BP readings: Does not have a cuff to check his BP at home.  Wt Readings from Last 3 Encounters:  12/05/15 347 lb (157.398 kg)  11/22/15 345 lb 8 oz (156.718 kg)  10/12/15 347 lb (157.398 kg)   BP Readings from Last 3 Encounters:  12/05/15 140/80  11/22/15 152/102  10/12/15 168/120   Pulse Readings from Last 3 Encounters:  12/05/15 94  11/22/15 80  10/12/15 62    Renal function: Estimated Creatinine Clearance: 136.5 mL/min (by C-G formula based on Cr of 0.95).  Past Medical History  Diagnosis Date  .  Hypertension   . COPD (chronic obstructive pulmonary disease) (HCC)   . Asthma   . Arthritis   . Coronary artery disease   . Sleep apnea     USES CPAP  . Shortness of breath dyspnea   . Pneumonia     HX OF PNA  . Depression   . GERD (gastroesophageal reflux disease)   . Myocardial infarction (HCC)   . Hyperlipidemia     Current Outpatient Prescriptions on File Prior to Visit  Medication Sig Dispense Refill  . amLODipine (NORVASC) 10 MG tablet Take 1 tablet (10 mg total) by mouth daily. 90 tablet 1  . aspirin EC 81 MG tablet Take 81 mg by mouth daily.    Marland Kitchen atorvastatin (LIPITOR) 80 MG tablet Take 1 tablet (80 mg total) by mouth daily at 6 PM. 30 tablet 11  . Cholecalciferol 2000 UNITS CAPS Take 2,000 Units by mouth daily.     . cloNIDine (CATAPRES) 0.3 MG tablet Take 0.3 mg by mouth 2 (two) times daily.     . cyclobenzaprine (FLEXERIL) 10 MG tablet Take 1 tablet (10 mg total) by mouth 3 (three) times daily as needed for muscle spasms. 60 tablet 3  . furosemide (LASIX) 40 MG tablet Take 1 tablet (40 mg total) by mouth daily. 30 tablet 11  . gabapentin (NEURONTIN) 300 MG capsule Take 1 capsule (300 mg total) by mouth at bedtime. (Patient taking differently: Take 300 mg by mouth at  bedtime as needed (For back pain.). ) 30 capsule 0  . hydrALAZINE (APRESOLINE) 50 MG tablet Take 1 tablet (50 mg total) by mouth 3 (three) times daily. (Patient taking differently: Take 50 mg by mouth 3 (three) times daily. Takes two to three times daily (3 times daily if at home).) 90 tablet 3  . ibuprofen (ADVIL,MOTRIN) 800 MG tablet Take 1 tablet (800 mg total) by mouth every 8 (eight) hours as needed. (Patient taking differently: Take 800 mg by mouth every 8 (eight) hours as needed for moderate pain. ) 30 tablet 1  . lisinopril (PRINIVIL,ZESTRIL) 40 MG tablet Take 1 tablet (40 mg total) by mouth daily. (Patient taking differently: Take 40 mg by mouth 2 (two) times daily. ) 90 tablet 3  . metoprolol succinate  (TOPROL-XL) 50 MG 24 hr tablet Take 1 tablet (50 mg total) by mouth daily. Take with or immediately following a meal. 90 tablet 3  . mometasone (NASONEX) 50 MCG/ACT nasal spray Place 2 sprays into the nose daily. (Patient taking differently: Place 2 sprays into the nose daily as needed (Allergies). ) 17 g 12  . Nicotine Polacrilex (RA NICOTINE GUM MT) Use as directed 1 each in the mouth or throat as needed (Smoking Cessation).    . nitroGLYCERIN (NITROSTAT) 0.4 MG SL tablet Place 1 tablet (0.4 mg total) under the tongue every 5 (five) minutes as needed for chest pain. 25 tablet 2  . omeprazole (PRILOSEC) 20 MG capsule Take 20 mg by mouth daily.    Marland Kitchen. oxycodone (OXY-IR) 5 MG capsule Take 10 mg by mouth 3 (three) times daily as needed. Reported on 09/27/2015    . potassium chloride SA (K-DUR,KLOR-CON) 20 MEQ tablet Take 1 tablet (20 mEq total) by mouth 2 (two) times daily. (Patient taking differently: Take 20 mEq by mouth daily. ) 30 tablet 11  . prasugrel (EFFIENT) 10 MG TABS tablet Take 1 tablet (10 mg total) by mouth daily. 30 tablet 1  . sildenafil (VIAGRA) 100 MG tablet Take 1 tablet (100 mg total) by mouth daily as needed for erectile dysfunction. 10 tablet 6  . spironolactone (ALDACTONE) 50 MG tablet Take 1 tablet (50 mg total) by mouth daily. 90 tablet 3  . tiotropium (SPIRIVA) 18 MCG inhalation capsule Place 18 mcg into inhaler and inhale daily as needed (Pt states he does not use daily but uses as he feels he needs it.).     No current facility-administered medications on file prior to visit.    No Known Allergies   Assessment/Plan:  1. Hypertension - Pt's BP of 132/88 is now at goal <140/3990mmHg since increasing hydralazine and spironolactone. Will have patient continue current BP medications. He will continue to bring a small pill box out with him during the day to help him remember his afternoon doses of hydralazine and clonidine. Printed out a list of BP meds for pt to match up with when  he takes them during the day. Pt to follow up with Dr. Elease HashimotoNahser as needed.   Megan E. Supple, PharmD, CPP  [facility] [address], [place], [place] [address] Phone: [phone]; Fax: [phone]) [phone] 12/08/2015 9:49 AM

## 2015-12-08 NOTE — Patient Instructions (Signed)
Continue taking your blood pressure medications as follows:  Morning: -Clonidine 0.3mg  -Lasix 40mg  -Hydralazine 50mg  -Spironolactone 50mg   Afternoon: -Clonidine 0.3mg  -Hydralazine 50mg   Evening: -Amlodipine -Clonidine 0.3mg  -Hydralazine 50mg  -Lisinopril 40mg  -Metoprolol 50mg 

## 2015-12-11 ENCOUNTER — Encounter (HOSPITAL_COMMUNITY)
Admission: RE | Admit: 2015-12-11 | Discharge: 2015-12-11 | Disposition: A | Discharge status: Home / Self Care | Payer: Medicaid Other | Source: Ambulatory Visit | Attending: Cardiovascular Disease | Admitting: Cardiovascular Disease

## 2015-12-11 DIAGNOSIS — I214 Non-ST elevation (NSTEMI) myocardial infarction: Secondary | ICD-10-CM | POA: Diagnosis not present

## 2015-12-13 ENCOUNTER — Encounter (HOSPITAL_COMMUNITY): Payer: Medicaid Other

## 2015-12-15 ENCOUNTER — Encounter (HOSPITAL_COMMUNITY): Payer: Medicaid Other

## 2015-12-18 ENCOUNTER — Encounter (HOSPITAL_COMMUNITY)
Admission: RE | Admit: 2015-12-18 | Discharge: 2015-12-18 | Disposition: A | Discharge status: Home / Self Care | Payer: Medicaid Other | Source: Ambulatory Visit | Attending: Cardiovascular Disease | Admitting: Cardiovascular Disease

## 2015-12-18 ENCOUNTER — Other Ambulatory Visit: Payer: Self-pay | Admitting: Family

## 2015-12-18 DIAGNOSIS — I214 Non-ST elevation (NSTEMI) myocardial infarction: Secondary | ICD-10-CM | POA: Diagnosis not present

## 2015-12-18 NOTE — Progress Notes (Signed)
Cardiac Individual Treatment Plan  Patient Details  Name: Chris Powell MRN: [id number] Date of Birth: 05/11/1966 Referring Provider:  Vesta MixerNahser, Philip J, MD  Initial Encounter Date:       CARDIAC REHAB PHASE II [facility]   Date  12/05/15      Visit Diagnosis: No diagnosis found.  Patient's Home Medications on Admission:  Current outpatient prescriptions:  .  amLODipine (NORVASC) 10 MG tablet, Take 1 tablet (10 mg total) by mouth daily., Disp: 90 tablet, Rfl: 1 .  aspirin EC 81 MG tablet, Take 81 mg by mouth daily., Disp: , Rfl:  .  atorvastatin (LIPITOR) 80 MG tablet, Take 1 tablet (80 mg total) by mouth daily at 6 PM., Disp: 30 tablet, Rfl: 11 .  Cholecalciferol 2000 UNITS CAPS, Take 2,000 Units by mouth daily. , Disp: , Rfl:  .  cloNIDine (CATAPRES) 0.3 MG tablet, Take 0.3 mg by mouth 2 (two) times daily. , Disp: , Rfl:  .  cyclobenzaprine (FLEXERIL) 10 MG tablet, Take 1 tablet (10 mg total) by mouth 3 (three) times daily as needed for muscle spasms., Disp: 60 tablet, Rfl: 3 .  furosemide (LASIX) 40 MG tablet, Take 1 tablet (40 mg total) by mouth daily., Disp: 30 tablet, Rfl: 11 .  gabapentin (NEURONTIN) 300 MG capsule, Take 1 capsule (300 mg total) by mouth at bedtime. (Patient taking differently: Take 300 mg by mouth at bedtime as needed (For back pain.). ), Disp: 30 capsule, Rfl: 0 .  hydrALAZINE (APRESOLINE) 50 MG tablet, Take 1 tablet (50 mg total) by mouth 3 (three) times daily. (Patient taking differently: Take 50 mg by mouth 3 (three) times daily. Takes two to three times daily (3 times daily if at home).), Disp: 90 tablet, Rfl: 3 .  ibuprofen (ADVIL,MOTRIN) 800 MG tablet, TAKE 1 TABLET (800 MG TOTAL) BY MOUTH EVERY 8 (EIGHT) HOURS AS NEEDED., Disp: 30 tablet, Rfl: 0 .  lisinopril (PRINIVIL,ZESTRIL) 40 MG tablet, Take 1 tablet (40 mg total) by mouth daily. (Patient taking differently: Take 40 mg by mouth 2 (two) times daily.  ), Disp: 90 tablet, Rfl: 3 .  metoprolol succinate (TOPROL-XL) 50 MG 24 hr tablet, Take 1 tablet (50 mg total) by mouth daily. Take with or immediately following a meal., Disp: 90 tablet, Rfl: 3 .  mometasone (NASONEX) 50 MCG/ACT nasal spray, Place 2 sprays into the nose daily. (Patient taking differently: Place 2 sprays into the nose daily as needed (Allergies). ), Disp: 17 g, Rfl: 12 .  Nicotine Polacrilex (RA NICOTINE GUM MT), Use as directed 1 each in the mouth or throat as needed (Smoking Cessation)., Disp: , Rfl:  .  nitroGLYCERIN (NITROSTAT) 0.4 MG SL tablet, Place 1 tablet (0.4 mg total) under the tongue every 5 (five) minutes as needed for chest pain., Disp: 25 tablet, Rfl: 2 .  omeprazole (PRILOSEC) 20 MG capsule, Take 20 mg by mouth daily., Disp: , Rfl:  .  oxycodone (OXY-IR) 5 MG capsule, Take 10 mg by mouth 3 (three) times daily as needed. Reported on 09/27/2015, Disp: , Rfl:  .  potassium chloride SA (K-DUR,KLOR-CON) 20 MEQ tablet, Take 1 tablet (20 mEq total) by mouth 2 (two) times daily. (Patient taking differently: Take 20 mEq by mouth daily. ), Disp: 30 tablet, Rfl: 11 .  prasugrel (EFFIENT) 10 MG TABS tablet, Take 1 tablet (10 mg total) by mouth daily., Disp: 30 tablet, Rfl: 1 .  sildenafil (VIAGRA) 100 MG tablet, Take  1 tablet (100 mg total) by mouth daily as needed for erectile dysfunction., Disp: 10 tablet, Rfl: 6 .  spironolactone (ALDACTONE) 50 MG tablet, Take 1 tablet (50 mg total) by mouth daily., Disp: 90 tablet, Rfl: 3 .  tiotropium (SPIRIVA) 18 MCG inhalation capsule, Place 18 mcg into inhaler and inhale daily as needed (Pt states he does not use daily but uses as he feels he needs it.)., Disp: , Rfl:   Past Medical History: Past Medical History  Diagnosis Date  . Hypertension   . COPD (chronic obstructive pulmonary disease) (HCC)   . Asthma   . Arthritis   . Coronary artery disease   . Sleep apnea     USES CPAP  . Shortness of breath dyspnea   . Pneumonia     HX  OF PNA  . Depression   . GERD (gastroesophageal reflux disease)   . Myocardial infarction (HCC)   . Hyperlipidemia     Tobacco Use: History  Smoking status  . Current Every Day Smoker -- 0.50 packs/day for 30 years  . Types: Cigarettes  Smokeless tobacco  . Never Used    Comment: " I have  tried vapor cigarettes"    Labs: Recent Review Flowsheet Data    Labs for ITP Cardiac and Pulmonary Rehab Latest Ref Rng 12/23/2014 01/24/2015 01/25/2015 02/28/2015 08/29/2015   Cholestrol 100 - 199 mg/dL 161 - 096 045 409   LDLCALC 0 - 99 mg/dL 65 - 64 69 87   HDL >81 mg/dL 19(J) - 47(W) 29(F) 62(Z)   Trlycerides 0 - 149 mg/dL 91 - 82 308 657   Hemoglobin A1c 4.8 - 5.6 % 5.8(H) - - - -   TCO2 0 - 100 mmol/L - 24 - - -      Capillary Blood Glucose: No results found for: GLUCAP   Exercise Target Goals:    Exercise Program Goal: Individual exercise prescription set with THRR, safety & activity barriers. Participant demonstrates ability to understand and report RPE using BORG scale, to self-measure pulse accurately, and to acknowledge the importance of the exercise prescription.  Exercise Prescription Goal: Starting with aerobic activity 30 plus minutes a day, 3 days per week for initial exercise prescription. Provide home exercise prescription and guidelines that participant acknowledges understanding prior to discharge.  Activity Barriers & Risk Stratification:     Activity Barriers & Cardiac Risk Stratification - 12/05/15 0845    Activity Barriers & Cardiac Risk Stratification   Activity Barriers --  chronic r and l knee pain   Cardiac Risk Stratification High      6 Minute Walk:     6 Minute Walk      12/05/15 0822       6 Minute Walk   Phase Initial     Distance 1450 feet     Walk Time 6 minutes     # of Rest Breaks 0     MPH 2.75     METS 3.11     RPE 14     Perceived Dyspnea  13     VO2 Peak 11.64     Symptoms Yes (comment)     Comments Chronic knee pain. Patient  gave rating of 10 on scale of 1 to 10. Patient stated pain was not exercise limiting.     Resting HR 94 bpm     Resting BP 140/80 mmHg     Max Ex. HR 121 bpm     Max Ex. BP 172/94 mmHg  2 Minute Post BP 142/88 mmHg     Pre/Post BP   Baseline BP 140/80 mmHg     6 Minute BP 172/94 mmHg     Pre/Post BP? Yes        Initial Exercise Prescription:     Initial Exercise Prescription - 12/05/15 1200    Date of Initial Exercise Prescription   Date 12/05/15   Treadmill   MPH 1.8   Grade 0   Minutes 15   METs 2.38   NuStep   Level 2   Minutes 15   METs 1.5   Arm Ergometer   Level 1.5   Minutes 15   METs 1.5   Prescription Details   Frequency (times per week) 3   Intensity   THRR REST +  30   THRR 40-80% of Max Heartrate 113-132   Ratings of Perceived Exertion 11-13   Progression   Progression Continue to progress workloads to maintain intensity without signs/symptoms of physical distress.   Resistance Training   Training Prescription Yes   Weight 1.0   Reps 10-12      Perform Capillary Blood Glucose checks as needed.  Exercise Prescription Changes:     Exercise Prescription Changes      12/18/15 1300           Exercise Review   Progression Yes       Response to Exercise   Blood Pressure (Admit) 120/84 mmHg       Blood Pressure (Exercise) 160/98 mmHg       Blood Pressure (Exit) 136/90 mmHg       Heart Rate (Admit) 67 bpm       Heart Rate (Exercise) 91 bpm       Heart Rate (Exit) 66 bpm       Rating of Perceived Exertion (Exercise) 11       Symptoms No       Progression   Progression Continue to progress workloads to maintain intensity without signs/symptoms of physical distress.       Resistance Training   Training Prescription Yes       Weight 2.0       Reps 10-12       Treadmill   MPH 1.5       Grade 0       Minutes 15       METs 2.2       NuStep   Level 2       Minutes 15       METs 3.84       Arm Ergometer   Level 1.5       Minutes 15        METs 1.5          Exercise Comments:    Discharge Exercise Prescription (Final Exercise Prescription Changes):     Exercise Prescription Changes - 12/18/15 1300    Exercise Review   Progression Yes   Response to Exercise   Blood Pressure (Admit) 120/84 mmHg   Blood Pressure (Exercise) 160/98 mmHg   Blood Pressure (Exit) 136/90 mmHg   Heart Rate (Admit) 67 bpm   Heart Rate (Exercise) 91 bpm   Heart Rate (Exit) 66 bpm   Rating of Perceived Exertion (Exercise) 11   Symptoms No   Progression   Progression Continue to progress workloads to maintain intensity without signs/symptoms of physical distress.   Resistance Training   Training Prescription Yes   Weight 2.0   Reps 10-12  Treadmill   MPH 1.5   Grade 0   Minutes 15   METs 2.2   NuStep   Level 2   Minutes 15   METs 3.84   Arm Ergometer   Level 1.5   Minutes 15   METs 1.5      Nutrition:  Target Goals: Understanding of nutrition guidelines, daily intake of sodium 1500mg , cholesterol 200mg , calories 30% from fat and 7% or less from saturated fats, daily to have 5 or more servings of fruits and vegetables.  Biometrics:     Pre Biometrics - 12/05/15 1246    Pre Biometrics   Height  (1.702 m)   Weight (!) 347 lb (157.398 kg)   Waist Circumference 55 inches   Hip Circumference 60 inches   Waist to Hip Ratio 0.92 %   BMI (Calculated) 54.5   Triceps Skinfold 20 mm   % Body Fat 45.8 %   Grip Strength 132 kg   Flexibility 0 in   Single Leg Stand 25 seconds       Nutrition Therapy Plan and Nutrition Goals:     Nutrition Therapy & Goals - 12/05/15 0847    Intervention Plan   Intervention Nutrition handout(s) given to patient.   Expected Outcomes Short Term Goal: Understand basic principles of dietary content, such as calories, fat, sodium, cholesterol and nutrients.;Long Term Goal: Adherence to prescribed nutrition plan.      Nutrition Discharge: Rate Your Plate Scores:     Nutrition  Assessments - 12/05/15 0848    MEDFICTS Scores   Pre Score 68      Nutrition Goals Re-Evaluation:     Nutrition Goals Re-Evaluation      12/18/15 1509           Intervention Plan   Intervention Continue to educate, counsel and set short/long term goals regarding individualized specific personal dietary modifications.          Psychosocial: Target Goals: Acknowledge presence or absence of depression, maximize coping skills, provide positive support system. Participant is able to verbalize types and ability to use techniques and skills needed for reducing stress and depression.  Initial Review & Psychosocial Screening:     Initial Psych Review & Screening - 12/05/15 0851    Initial Review   Current issues with Current Sleep Concerns   Family Dynamics   Good Support System? Yes   Concerns --  none   Barriers   Psychosocial barriers to participate in program There are no identifiable barriers or psychosocial needs.   Screening Interventions   Interventions Encouraged to exercise      Quality of Life Scores:     Quality of Life - 12/05/15 1307    Quality of Life Scores   Health/Function Pre 16.53 %   Socioeconomic Pre 14.06 %   Psych/Spiritual Pre 18.93 %   Family Pre 21.2 %   GLOBAL Pre 17.11 %      PHQ-9:     Recent Review Flowsheet Data    Depression screen [facility] 2/9 12/05/2015 09/12/2015 07/13/2015 02/28/2015 12/29/2014   Decreased Interest 1 0 1 0 0   Down, Depressed, Hopeless 0 0 1 0 0   PHQ - 2 Score 1 0 2 0 0   Altered sleeping 0 - 2 - -   Tired, decreased energy 1 - 1 - -   Change in appetite 0 - 1 - -   Feeling bad or failure about yourself  0 - 0 - -  Trouble concentrating 0 - 1 - -   Moving slowly or fidgety/restless 0 - 0 - -   Suicidal thoughts 0 - 0 - -   PHQ-9 Score 2 - 7 - -   Difficult doing work/chores Somewhat difficult - - - -      Psychosocial Evaluation and Intervention:   Psychosocial Re-Evaluation:     Psychosocial  Re-Evaluation      12/18/15 1512           Psychosocial Re-Evaluation   Interventions Encouraged to attend Cardiac Rehabilitation for the exercise       Continued Psychosocial Services Needed No          Vocational Rehabilitation: Provide vocational rehab assistance to qualifying candidates.   Vocational Rehab Evaluation & Intervention:     Vocational Rehab - 12/05/15 0846    Initial Vocational Rehab Evaluation & Intervention   Assessment shows need for Vocational Rehabilitation No      Education: Education Goals: Education classes will be provided on a weekly basis, covering required topics. Participant will state understanding/return demonstration of topics presented.  Learning Barriers/Preferences:     Learning Barriers/Preferences - 12/05/15 0845    Learning Barriers/Preferences   Learning Barriers None   Learning Preferences Skilled Demonstration      Education Topics: Hypertension, Hypertension Reduction -Define heart disease and high blood pressure. Discus how high blood pressure affects the body and ways to reduce high blood pressure.   Exercise and Your Heart -Discuss why it is important to exercise, the FITT principles of exercise, normal and abnormal responses to exercise, and how to exercise safely.   Angina -Discuss definition of angina, causes of angina, treatment of angina, and how to decrease risk of having angina.   Cardiac Medications -Review what the following cardiac medications are used for, how they affect the body, and side effects that may occur when taking the medications.  Medications include Aspirin, Beta blockers, calcium channel blockers, ACE Inhibitors, angiotensin receptor blockers, diuretics, digoxin, and antihyperlipidemics.   Congestive Heart Failure -Discuss the definition of CHF, how to live with CHF, the signs and symptoms of CHF, and how keep track of weight and sodium intake.   Heart Disease and Intimacy -Discus the  effect sexual activity has on the heart, how changes occur during intimacy as we age, and safety during sexual activity.   Smoking Cessation / COPD -Discuss different methods to quit smoking, the health benefits of quitting smoking, and the definition of COPD.   Nutrition I: Fats -Discuss the types of cholesterol, what cholesterol does to the heart, and how cholesterol levels can be controlled.   Nutrition II: Labels -Discuss the different components of food labels and how to read food label   Heart Parts and Heart Disease -Discuss the anatomy of the heart, the pathway of blood circulation through the heart, and these are affected by heart disease.   Stress I: Signs and Symptoms -Discuss the causes of stress, how stress may lead to anxiety and depression, and ways to limit stress.   Stress II: Relaxation -Discuss different types of relaxation techniques to limit stress.   Warning Signs of Stroke / TIA -Discuss definition of a stroke, what the signs and symptoms are of a stroke, and how to identify when someone is having stroke.   Knowledge Questionnaire Score:     Knowledge Questionnaire Score - 12/05/15 0846    Knowledge Questionnaire Score   Pre Score 21/28      Core Components/Risk Factors/Patient Goals  at Admission:     Personal Goals and Risk Factors at Admission - 12/05/15 0848    Core Components/Risk Factors/Patient Goals on Admission    Weight Management Yes;Obesity   Intervention Obesity: Provide education and appropriate resources to help participant work on and attain dietary goals.;Weight Management: Provide education and appropriate resources to help participant work on and attain dietary goals.   Admit Weight 347 lb (157.398 kg)   Goal Weight: Short Term 330 lb (149.687 kg)   Expected Outcomes Short Term: Continue to assess and modify interventions until short term weight is achieved.;Long Term: Adherence to nutrition and physical activity/exercise  program aimed toward attainment of established weight goal.   Tobacco Cessation Yes   Number of packs per day 0.5   Intervention Assist the participant in steps to quit. Provide individualized education and counseling about committing to Tobacco Cessation, relapse prevention, and pharmacological support that can be provided by physician.;Education officer, environmental, assist with locating and accessing local/national Quit Smoking programs, and support quit date choice.   Expected Outcomes Short Term: Will demonstrate readiness to quit, by selecting a quit date.   Improve shortness of breath with ADL's Yes   Intervention Provide education, individualized exercise plan and daily activity instruction to help decrease symptoms of SOB with activities of daily living.   Expected Outcomes Short Term: Achieves a reduction of symptoms when performing activities of daily living.   Hypertension Yes   Intervention Provide education on lifestyle modifcations including regular physical activity/exercise, weight management, moderate sodium restriction and increased consumption of fresh fruit, vegetables, and low fat dairy, alcohol moderation, and smoking cessation.;Monitor prescription use compliance.   Expected Outcomes Long Term: Maintenance of blood pressure at goal levels.   Lipids Yes   Intervention Provide education and support for participant on nutrition & aerobic/resistive exercise along with prescribed medications to achieve LDL 70mg , HDL >40mg .   Expected Outcomes Short Term: Participant states understanding of desired cholesterol values and is compliant with medications prescribed. Participant is following exercise prescription and nutrition guidelines.      Core Components/Risk Factors/Patient Goals Review:      Goals and Risk Factor Review      12/18/15 1510           Core Components/Risk Factors/Patient Goals Review   Personal Goals Review Weight Management/Obesity;Improve shortness of  breath with ADL's;Hypertension;Lipids;Tobacco Cessation       Review Will continue to education on these topics.           Core Components/Risk Factors/Patient Goals at Discharge (Final Review):      Goals and Risk Factor Review - 12/18/15 1510    Core Components/Risk Factors/Patient Goals Review   Personal Goals Review Weight Management/Obesity;Improve shortness of breath with ADL's;Hypertension;Lipids;Tobacco Cessation   Review Will continue to education on these topics.       ITP Comments:     ITP Comments      12/05/15 0846           ITP Comments Patient is 50 year old African American Male who lives with fiance.  Patient enjoys music.  patient still smokes. Patient not interested in smoking cessation classes. Patient does not exhibit any s/s of depression.           Comments: Patients goals and exercise prescription reviewed.

## 2015-12-20 ENCOUNTER — Encounter (HOSPITAL_COMMUNITY)
Admission: RE | Admit: 2015-12-20 | Discharge: 2015-12-20 | Disposition: A | Discharge status: Home / Self Care | Payer: Medicaid Other | Source: Ambulatory Visit | Attending: Cardiovascular Disease | Admitting: Cardiovascular Disease

## 2015-12-20 DIAGNOSIS — I214 Non-ST elevation (NSTEMI) myocardial infarction: Secondary | ICD-10-CM | POA: Diagnosis not present

## 2015-12-22 ENCOUNTER — Encounter (HOSPITAL_COMMUNITY)
Admission: RE | Admit: 2015-12-22 | Discharge: 2015-12-22 | Disposition: A | Discharge status: Home / Self Care | Payer: Medicaid Other | Source: Ambulatory Visit | Attending: Cardiovascular Disease | Admitting: Cardiovascular Disease

## 2015-12-22 DIAGNOSIS — I214 Non-ST elevation (NSTEMI) myocardial infarction: Secondary | ICD-10-CM | POA: Diagnosis not present

## 2015-12-25 ENCOUNTER — Encounter (HOSPITAL_COMMUNITY)
Admission: RE | Admit: 2015-12-25 | Discharge: 2015-12-25 | Disposition: A | Discharge status: Home / Self Care | Payer: Medicaid Other | Source: Ambulatory Visit | Attending: Cardiovascular Disease | Admitting: Cardiovascular Disease

## 2015-12-25 DIAGNOSIS — Z955 Presence of coronary angioplasty implant and graft: Secondary | ICD-10-CM | POA: Insufficient documentation

## 2015-12-25 DIAGNOSIS — I2510 Atherosclerotic heart disease of native coronary artery without angina pectoris: Secondary | ICD-10-CM | POA: Insufficient documentation

## 2015-12-25 DIAGNOSIS — I214 Non-ST elevation (NSTEMI) myocardial infarction: Secondary | ICD-10-CM | POA: Diagnosis not present

## 2015-12-26 ENCOUNTER — Telehealth: Payer: Self-pay | Admitting: Family

## 2015-12-26 NOTE — Telephone Encounter (Signed)
Patient called stating that Viagra is too expensive

## 2015-12-27 ENCOUNTER — Telehealth: Payer: Self-pay | Admitting: *Deleted

## 2015-12-27 ENCOUNTER — Encounter (HOSPITAL_COMMUNITY)
Admission: RE | Admit: 2015-12-27 | Discharge: 2015-12-27 | Disposition: A | Discharge status: Home / Self Care | Payer: Medicaid Other | Source: Ambulatory Visit | Attending: Cardiovascular Disease | Admitting: Cardiovascular Disease

## 2015-12-27 DIAGNOSIS — I214 Non-ST elevation (NSTEMI) myocardial infarction: Secondary | ICD-10-CM | POA: Diagnosis not present

## 2015-12-27 NOTE — Telephone Encounter (Signed)
The viagra is not paid for by his insurance. Could you send in something that his insurance would cover , please.

## 2015-12-28 MED ORDER — SILDENAFIL CITRATE 20 MG PO TABS: ORAL_TABLET | ORAL | Status: DC

## 2015-12-28 NOTE — Telephone Encounter (Signed)
Pt can get generic Viagra at [facility] in [place] or at [facility]. Would he like to try this and if so which place?

## 2015-12-28 NOTE — Telephone Encounter (Signed)
Prescription sent to pharmacy. Pt not to take sildenafil and nitroglycerin together!

## 2015-12-28 NOTE — Telephone Encounter (Signed)
Pt is aware.  

## 2015-12-29 ENCOUNTER — Encounter (HOSPITAL_COMMUNITY)
Admission: RE | Admit: 2015-12-29 | Discharge: 2015-12-29 | Disposition: A | Discharge status: Home / Self Care | Payer: Medicaid Other | Source: Ambulatory Visit | Attending: Cardiovascular Disease | Admitting: Cardiovascular Disease

## 2015-12-29 DIAGNOSIS — I214 Non-ST elevation (NSTEMI) myocardial infarction: Secondary | ICD-10-CM | POA: Diagnosis not present

## 2016-01-01 ENCOUNTER — Encounter (HOSPITAL_COMMUNITY)
Admission: RE | Admit: 2016-01-01 | Discharge: 2016-01-01 | Disposition: A | Discharge status: Home / Self Care | Payer: Medicaid Other | Source: Ambulatory Visit | Attending: Cardiovascular Disease | Admitting: Cardiovascular Disease

## 2016-01-01 DIAGNOSIS — I214 Non-ST elevation (NSTEMI) myocardial infarction: Secondary | ICD-10-CM | POA: Diagnosis not present

## 2016-01-02 ENCOUNTER — Telehealth: Payer: Self-pay | Admitting: Cardiovascular Disease

## 2016-01-02 ENCOUNTER — Telehealth: Payer: Self-pay | Admitting: Family Medicine

## 2016-01-02 NOTE — Telephone Encounter (Signed)
He does not have that restriction at this point. We can change to Plavix 75 a day instead of Effient. He may hold the Plavix if needed for any procedure

## 2016-01-02 NOTE — Telephone Encounter (Signed)
Patient wants to see Dr. Ermalinda MemosBradshaw to discuss referral, appointment made for 4/20 at 9:25 am

## 2016-01-02 NOTE — Telephone Encounter (Signed)
Spoke with pt and informed him that I see where our office wrote a letter last year saying we couldn't stop his Effient for a year due to stents being placed on 12/22/14. Advised pt that we are now over the one year mark so it would be up to Dr. Elease HashimotoNahser if he would like to hold meds for procedures. Pt states that he does not want the injections in his back because he is not big on needles. Advised pt that I would route a message to Dr. Elease HashimotoNahser to see if he is able to assist with a letter. Pt asked that we call him back and let him know either way.

## 2016-01-02 NOTE — Telephone Encounter (Signed)
New message      Pt needs a letter stating that he is on effient and cannot have a shot in his back for pain.  Dr Elease HashimotoNahser had written a letter once, but the pain management center said they could not find it.  Please fax letter to [facility] at [phone].  He has an appt next month.

## 2016-01-03 ENCOUNTER — Encounter (HOSPITAL_COMMUNITY)
Admission: RE | Admit: 2016-01-03 | Discharge: 2016-01-03 | Disposition: A | Discharge status: Home / Self Care | Payer: Medicaid Other | Source: Ambulatory Visit | Attending: Cardiovascular Disease | Admitting: Cardiovascular Disease

## 2016-01-03 DIAGNOSIS — I214 Non-ST elevation (NSTEMI) myocardial infarction: Secondary | ICD-10-CM | POA: Diagnosis not present

## 2016-01-03 NOTE — Telephone Encounter (Signed)
Notified of Dr. Harvie BridgeNahser's recommendations that he could not write letter stating the Effient could be stopped since he had stents over a year ago.  Dr. Elease HashimotoNahser suggested that we could change him to Plavix 75 mg instead of the Effient and would be able to hold Plavix for any procedure.  Mr. Chris Powell states he is not going to change medication and hung up the phone.

## 2016-01-05 ENCOUNTER — Encounter (HOSPITAL_COMMUNITY)
Admission: RE | Admit: 2016-01-05 | Discharge: 2016-01-05 | Disposition: A | Discharge status: Home / Self Care | Payer: Medicaid Other | Source: Ambulatory Visit | Attending: Cardiovascular Disease | Admitting: Cardiovascular Disease

## 2016-01-05 DIAGNOSIS — I214 Non-ST elevation (NSTEMI) myocardial infarction: Secondary | ICD-10-CM | POA: Diagnosis not present

## 2016-01-08 ENCOUNTER — Encounter (HOSPITAL_COMMUNITY)
Admission: RE | Admit: 2016-01-08 | Discharge: 2016-01-08 | Disposition: A | Discharge status: Home / Self Care | Payer: Medicaid Other | Source: Ambulatory Visit | Attending: Cardiovascular Disease | Admitting: Cardiovascular Disease

## 2016-01-08 DIAGNOSIS — I214 Non-ST elevation (NSTEMI) myocardial infarction: Secondary | ICD-10-CM | POA: Diagnosis not present

## 2016-01-10 ENCOUNTER — Encounter (HOSPITAL_COMMUNITY): Payer: Medicaid Other

## 2016-01-11 ENCOUNTER — Encounter: Payer: Self-pay | Admitting: Family Medicine

## 2016-01-11 ENCOUNTER — Ambulatory Visit (INDEPENDENT_AMBULATORY_CARE_PROVIDER_SITE_OTHER): Payer: Medicaid Other | Admitting: Family Medicine

## 2016-01-11 ENCOUNTER — Encounter (INDEPENDENT_AMBULATORY_CARE_PROVIDER_SITE_OTHER): Payer: Self-pay

## 2016-01-11 VITALS — BP 146/98 | HR 76 | Temp 98.0°F | Ht 67.0 in | Wt 348.0 lb

## 2016-01-11 DIAGNOSIS — G894 Chronic pain syndrome: Secondary | ICD-10-CM | POA: Diagnosis not present

## 2016-01-11 DIAGNOSIS — G8929 Other chronic pain: Secondary | ICD-10-CM

## 2016-01-11 DIAGNOSIS — M25569 Pain in unspecified knee: Secondary | ICD-10-CM

## 2016-01-11 MED ORDER — IBUPROFEN 800 MG PO TABS: ORAL_TABLET | ORAL | Status: DC

## 2016-01-11 MED ORDER — GABAPENTIN 300 MG PO CAPS: 300.0000 mg | ORAL_CAPSULE | Freq: Every day | ORAL | Status: DC

## 2016-01-11 NOTE — Progress Notes (Signed)
   HPI  Patient presents today here to request pain management clinic change.  Patient explains that his current clinic his physician is never present, they have lowered his dose and won't go back up. He requests referral to specific pain clinic his friend goes to. He states that he does not want epidural injections, he would like to go back to his old dose of oxycodone.  He has started cardiac rehabilitation and states that the pain is worse after working out.  PMH: Smoking status noted ROS: Per HPI  Objective: BP 146/98 mmHg  Pulse 76  Temp(Src) 98 F (36.7 C) (Oral)  Ht 5\' 7"  (1.702 m)  Wt 348 lb (157.852 kg)  BMI 54.49 kg/m2 Gen: NAD, alert, cooperative with exam, obese HEENT: NCAT CV: RRR, good S1/S2, no murmur Resp: CTABL, no wheezes, non-labored Ext: No edema, warm Neuro: Alert and oriented, No gross deficits  Assessment and plan:  # Chronic pain. Referral placed for new pain management clinic Discussed expectations about how fast the appointment will be, hopefully less than 3 months. Return to clinic with any concerns  Refilled NSAIDs, minimize use with heart disease     Meds ordered this encounter  Medications  . gabapentin (NEURONTIN) 300 MG capsule    Sig: Take 1 capsule (300 mg total) by mouth at bedtime.    Dispense:  30 capsule    Refill:  5  . ibuprofen (ADVIL,MOTRIN) 800 MG tablet    Sig: TAKE 1 TABLET (800 MG TOTAL) BY MOUTH EVERY 8 (EIGHT) HOURS AS NEEDED.    Dispense:  30 tablet    Refill:  2    Murtis SinkSam Maya Scholer, MD [facility] 01/11/2016, 9:49 AM

## 2016-01-12 ENCOUNTER — Encounter (HOSPITAL_COMMUNITY): Payer: Medicaid Other

## 2016-01-15 ENCOUNTER — Encounter (HOSPITAL_COMMUNITY): Payer: Medicaid Other

## 2016-01-17 ENCOUNTER — Encounter (HOSPITAL_COMMUNITY)
Admission: RE | Admit: 2016-01-17 | Discharge: 2016-01-17 | Disposition: A | Discharge status: Home / Self Care | Payer: Medicaid Other | Source: Ambulatory Visit | Attending: Cardiovascular Disease | Admitting: Cardiovascular Disease

## 2016-01-17 DIAGNOSIS — I214 Non-ST elevation (NSTEMI) myocardial infarction: Secondary | ICD-10-CM | POA: Diagnosis not present

## 2016-01-19 ENCOUNTER — Encounter (HOSPITAL_COMMUNITY)
Admission: RE | Admit: 2016-01-19 | Discharge: 2016-01-19 | Disposition: A | Discharge status: Home / Self Care | Payer: Medicaid Other | Source: Ambulatory Visit | Attending: Cardiovascular Disease | Admitting: Cardiovascular Disease

## 2016-01-19 DIAGNOSIS — I214 Non-ST elevation (NSTEMI) myocardial infarction: Secondary | ICD-10-CM | POA: Diagnosis not present

## 2016-01-19 NOTE — Progress Notes (Signed)
Cardiac Individual Treatment Plan  Patient Details  Name: NEAMIAH SCIARRA MRN: [id number] Date of Birth: [birth year] Referring Provider:    Initial Encounter Date:       CARDIAC REHAB PHASE II ORIENTATION from 12/05/2015 in [facility]   Date  12/05/15      Visit Diagnosis: No diagnosis found.  Patient's Home Medications on Admission:  Current outpatient prescriptions:  .  amLODipine (NORVASC) 10 MG tablet, Take 1 tablet (10 mg total) by mouth daily., Disp: 90 tablet, Rfl: 1 .  aspirin EC 81 MG tablet, Take 81 mg by mouth daily., Disp: , Rfl:  .  atorvastatin (LIPITOR) 80 MG tablet, Take 1 tablet (80 mg total) by mouth daily at 6 PM., Disp: 30 tablet, Rfl: 11 .  Cholecalciferol 2000 UNITS CAPS, Take 2,000 Units by mouth daily. , Disp: , Rfl:  .  cloNIDine (CATAPRES) 0.3 MG tablet, Take 0.3 mg by mouth 2 (two) times daily. , Disp: , Rfl:  .  cyclobenzaprine (FLEXERIL) 10 MG tablet, Take 1 tablet (10 mg total) by mouth 3 (three) times daily as needed for muscle spasms., Disp: 60 tablet, Rfl: 3 .  furosemide (LASIX) 40 MG tablet, Take 1 tablet (40 mg total) by mouth daily., Disp: 30 tablet, Rfl: 11 .  gabapentin (NEURONTIN) 300 MG capsule, Take 1 capsule (300 mg total) by mouth at bedtime., Disp: 30 capsule, Rfl: 5 .  hydrALAZINE (APRESOLINE) 50 MG tablet, Take 1 tablet (50 mg total) by mouth 3 (three) times daily. (Patient taking differently: Take 50 mg by mouth 3 (three) times daily. Takes two to three times daily (3 times daily if at home).), Disp: 90 tablet, Rfl: 3 .  ibuprofen (ADVIL,MOTRIN) 800 MG tablet, TAKE 1 TABLET (800 MG TOTAL) BY MOUTH EVERY 8 (EIGHT) HOURS AS NEEDED., Disp: 30 tablet, Rfl: 2 .  lisinopril (PRINIVIL,ZESTRIL) 40 MG tablet, Take 1 tablet (40 mg total) by mouth daily. (Patient taking differently: Take 40 mg by mouth 2 (two) times daily. ), Disp: 90 tablet, Rfl: 3 .  metoprolol succinate (TOPROL-XL) 50 MG 24 hr tablet, Take 1 tablet (50 mg total)  by mouth daily. Take with or immediately following a meal., Disp: 90 tablet, Rfl: 3 .  mometasone (NASONEX) 50 MCG/ACT nasal spray, Place 2 sprays into the nose daily. (Patient taking differently: Place 2 sprays into the nose daily as needed (Allergies). ), Disp: 17 g, Rfl: 12 .  Nicotine Polacrilex (RA NICOTINE GUM MT), Use as directed 1 each in the mouth or throat as needed (Smoking Cessation)., Disp: , Rfl:  .  nitroGLYCERIN (NITROSTAT) 0.4 MG SL tablet, Place 1 tablet (0.4 mg total) under the tongue every 5 (five) minutes as needed for chest pain., Disp: 25 tablet, Rfl: 2 .  omeprazole (PRILOSEC) 20 MG capsule, Take 20 mg by mouth daily., Disp: , Rfl:  .  oxycodone (OXY-IR) 5 MG capsule, Take 10 mg by mouth 3 (three) times daily as needed. Reported on 09/27/2015, Disp: , Rfl:  .  potassium chloride SA (K-DUR,KLOR-CON) 20 MEQ tablet, Take 1 tablet (20 mEq total) by mouth 2 (two) times daily. (Patient taking differently: Take 20 mEq by mouth daily. ), Disp: 30 tablet, Rfl: 11 .  prasugrel (EFFIENT) 10 MG TABS tablet, Take 1 tablet (10 mg total) by mouth daily., Disp: 30 tablet, Rfl: 1 .  sildenafil (REVATIO) 20 MG tablet, Take 2-5 tablets as needed for sexual activity, Disp: 50 tablet, Rfl: 1 .  spironolactone (ALDACTONE) 50 MG tablet,  Take 1 tablet (50 mg total) by mouth daily., Disp: 90 tablet, Rfl: 3 .  tiotropium (SPIRIVA) 18 MCG inhalation capsule, Place 18 mcg into inhaler and inhale daily as needed (Pt states he does not use daily but uses as he feels he needs it.)., Disp: , Rfl:   Past Medical History: Past Medical History  Diagnosis Date  . Hypertension   . COPD (chronic obstructive pulmonary disease) (HCC)   . Asthma   . Arthritis   . Coronary artery disease   . Sleep apnea     USES CPAP  . Shortness of breath dyspnea   . Pneumonia     HX OF PNA  . Depression   . GERD (gastroesophageal reflux disease)   . Myocardial infarction (HCC)   . Hyperlipidemia     Tobacco  Use: History  Smoking status  . Current Every Day Smoker -- 0.50 packs/day for 30 years  . Types: Cigarettes  Smokeless tobacco  . Never Used    Comment: " I have  tried vapor cigarettes"    Labs:     Recent Review Flowsheet Data    Labs for ITP Cardiac and Pulmonary Rehab Latest Ref Rng 12/23/2014 01/24/2015 01/25/2015 02/28/2015 08/29/2015   Cholestrol 100 - 199 mg/dL 161 - 096 045 409   LDLCALC 0 - 99 mg/dL 65 - 64 69 87   HDL >81 mg/dL 19(J) - 47(W) 29(F) 62(Z)   Trlycerides 0 - 149 mg/dL 91 - 82 308 657   Hemoglobin A1c 4.8 - 5.6 % 5.8(H) - - - -   TCO2 0 - 100 mmol/L - 24 - - -      Capillary Blood Glucose: No results found for: GLUCAP   Exercise Target Goals:    Exercise Program Goal: Individual exercise prescription set with THRR, safety & activity barriers. Participant demonstrates ability to understand and report RPE using BORG scale, to self-measure pulse accurately, and to acknowledge the importance of the exercise prescription.  Exercise Prescription Goal: Starting with aerobic activity 30 plus minutes a day, 3 days per week for initial exercise prescription. Provide home exercise prescription and guidelines that participant acknowledges understanding prior to discharge.  Activity Barriers & Risk Stratification:     Activity Barriers & Cardiac Risk Stratification - 12/05/15 0845    Activity Barriers & Cardiac Risk Stratification   Activity Barriers --  chronic r and l knee pain   Cardiac Risk Stratification High      6 Minute Walk:     6 Minute Walk      12/05/15 0822       6 Minute Walk   Phase Initial     Distance 1450 feet     Walk Time 6 minutes     # of Rest Breaks 0     MPH 2.75     METS 3.11     RPE 14     Perceived Dyspnea  13     VO2 Peak 11.64     Symptoms Yes (comment)     Comments Chronic knee pain. Patient gave rating of 10 on scale of 1 to 10. Patient stated pain was not exercise limiting.     Resting HR 94 bpm     Resting BP  140/80 mmHg     Max Ex. HR 121 bpm     Max Ex. BP 172/94 mmHg     2 Minute Post BP 142/88 mmHg     Pre/Post BP   Baseline BP 140/80  mmHg     6 Minute BP 172/94 mmHg     Pre/Post BP? Yes        Initial Exercise Prescription:     Initial Exercise Prescription - 12/05/15 1200    Date of Initial Exercise RX and Referring Provider   Date 12/05/15   Treadmill   MPH 1.8   Grade 0   Minutes 15   METs 2.38   NuStep   Level 2   Minutes 15   METs 1.5   Arm Ergometer   Level 1.5   Minutes 15   METs 1.5   Prescription Details   Frequency (times per week) 3   Intensity   THRR REST +  30   THRR 40-80% of Max Heartrate 113-132   Ratings of Perceived Exertion 11-13   Progression   Progression Continue to progress workloads to maintain intensity without signs/symptoms of physical distress.   Resistance Training   Training Prescription Yes   Weight 1.0   Reps 10-12      Perform Capillary Blood Glucose checks as needed.  Exercise Prescription Changes:      Exercise Prescription Changes      12/18/15 1300 01/19/16 1300         Exercise Review   Progression Yes Yes      Response to Exercise   Blood Pressure (Admit) 120/84 mmHg 130/80 mmHg      Blood Pressure (Exercise) 160/98 mmHg 130/70 mmHg      Blood Pressure (Exit) 136/90 mmHg 128/74 mmHg      Heart Rate (Admit) 67 bpm 60 bpm      Heart Rate (Exercise) 91 bpm 92 bpm      Heart Rate (Exit) 66 bpm 69 bpm      Rating of Perceived Exertion (Exercise) 11 12      Symptoms No No      Duration  Progress to 30 minutes of continuous aerobic without signs/symptoms of physical distress      Intensity  Rest + 30      Progression   Progression Continue to progress workloads to maintain intensity without signs/symptoms of physical distress. Continue to progress workloads to maintain intensity without signs/symptoms of physical distress.      Resistance Training   Training Prescription Yes Yes      Weight 2.0 3.0      Reps  10-12 10-12      Treadmill   MPH 1.5 2.2      Grade 0 0      Minutes 15 15      METs 2.2 2.7      NuStep   Level 2 2      Minutes 15 15      METs 3.84 2.4      Arm Ergometer   Level 1.5       Minutes 15       METs 1.5       Home Exercise Plan   Plans to continue exercise at  Home      Frequency  Add 2 additional days to program exercise sessions.         Exercise Comments:      Exercise Comments      01/19/16 1338           Exercise Comments Patient is progressing appropriately.            Discharge Exercise Prescription (Final Exercise Prescription Changes):     Exercise Prescription Changes - 01/19/16 1300  Exercise Review   Progression Yes   Response to Exercise   Blood Pressure (Admit) 130/80 mmHg   Blood Pressure (Exercise) 130/70 mmHg   Blood Pressure (Exit) 128/74 mmHg   Heart Rate (Admit) 60 bpm   Heart Rate (Exercise) 92 bpm   Heart Rate (Exit) 69 bpm   Rating of Perceived Exertion (Exercise) 12   Symptoms No   Duration Progress to 30 minutes of continuous aerobic without signs/symptoms of physical distress   Intensity Rest + 30   Progression   Progression Continue to progress workloads to maintain intensity without signs/symptoms of physical distress.   Resistance Training   Training Prescription Yes   Weight 3.0   Reps 10-12   Treadmill   MPH 2.2   Grade 0   Minutes 15   METs 2.7   NuStep   Level 2   Minutes 15   METs 2.4   Home Exercise Plan   Plans to continue exercise at Home   Frequency Add 2 additional days to program exercise sessions.      Nutrition:  Target Goals: Understanding of nutrition guidelines, daily intake of sodium 1500mg , cholesterol 200mg , calories 30% from fat and 7% or less from saturated fats, daily to have 5 or more servings of fruits and vegetables.  Biometrics:     Pre Biometrics - 12/05/15 1246    Pre Biometrics   Height  (1.702 m)   Weight (!) 347 lb (157.398 kg)   Waist Circumference 55  inches   Hip Circumference 60 inches   Waist to Hip Ratio 0.92 %   BMI (Calculated) 54.5   Triceps Skinfold 20 mm   % Body Fat 45.8 %   Grip Strength 132 kg   Flexibility 0 in   Single Leg Stand 25 seconds       Nutrition Therapy Plan and Nutrition Goals:     Nutrition Therapy & Goals - 12/05/15 0847    Intervention Plan   Intervention Nutrition handout(s) given to patient.   Expected Outcomes Short Term Goal: Understand basic principles of dietary content, such as calories, fat, sodium, cholesterol and nutrients.;Long Term Goal: Adherence to prescribed nutrition plan.      Nutrition Discharge: Rate Your Plate Scores:     Nutrition Assessments - 12/05/15 0848    MEDFICTS Scores   Pre Score 68      Nutrition Goals Re-Evaluation:     Nutrition Goals Re-Evaluation      12/18/15 1509           Intervention Plan   Intervention Continue to educate, counsel and set short/long term goals regarding individualized specific personal dietary modifications.          Psychosocial: Target Goals: Acknowledge presence or absence of depression, maximize coping skills, provide positive support system. Participant is able to verbalize types and ability to use techniques and skills needed for reducing stress and depression.  Initial Review & Psychosocial Screening:     Initial Psych Review & Screening - 12/05/15 0851    Initial Review   Current issues with Current Sleep Concerns   Family Dynamics   Good Support System? Yes   Concerns --  none   Barriers   Psychosocial barriers to participate in program There are no identifiable barriers or psychosocial needs.   Screening Interventions   Interventions Encouraged to exercise      Quality of Life Scores:     Quality of Life - 12/05/15 1307    Quality of Life  Scores   Health/Function Pre 16.53 %   Socioeconomic Pre 14.06 %   Psych/Spiritual Pre 18.93 %   Family Pre 21.2 %   GLOBAL Pre 17.11 %      PHQ-9:      Recent Review Flowsheet Data    Depression screen [facility] 2/9 01/11/2016 12/05/2015 09/12/2015 07/13/2015 02/28/2015   Decreased Interest 0 1 0 1 0   Down, Depressed, Hopeless 0 0 0 1 0   PHQ - 2 Score 0 1 0 2 0   Altered sleeping - 0 - 2 -   Tired, decreased energy - 1 - 1 -   Change in appetite - 0 - 1 -   Feeling bad or failure about yourself  - 0 - 0 -   Trouble concentrating - 0 - 1 -   Moving slowly or fidgety/restless - 0 - 0 -   Suicidal thoughts - 0 - 0 -   PHQ-9 Score - 2 - 7 -   Difficult doing work/chores - Somewhat difficult - - -      Psychosocial Evaluation and Intervention:   Psychosocial Re-Evaluation:     Psychosocial Re-Evaluation      12/18/15 1512 01/19/16 0831         Psychosocial Re-Evaluation   Interventions Encouraged to attend Cardiac Rehabilitation for the exercise       Continued Psychosocial Services Needed No No         Vocational Rehabilitation: Provide vocational rehab assistance to qualifying candidates.   Vocational Rehab Evaluation & Intervention:     Vocational Rehab - 12/05/15 0846    Initial Vocational Rehab Evaluation & Intervention   Assessment shows need for Vocational Rehabilitation No      Education: Education Goals: Education classes will be provided on a weekly basis, covering required topics. Participant will state understanding/return demonstration of topics presented.  Learning Barriers/Preferences:     Learning Barriers/Preferences - 12/05/15 0845    Learning Barriers/Preferences   Learning Barriers None   Learning Preferences Skilled Demonstration      Education Topics: Hypertension, Hypertension Reduction -Define heart disease and high blood pressure. Discus how high blood pressure affects the body and ways to reduce high blood pressure.   Exercise and Your Heart -Discuss why it is important to exercise, the FITT principles of exercise, normal and abnormal responses to exercise, and how to exercise  safely.   Angina -Discuss definition of angina, causes of angina, treatment of angina, and how to decrease risk of having angina.   Cardiac Medications -Review what the following cardiac medications are used for, how they affect the body, and side effects that may occur when taking the medications.  Medications include Aspirin, Beta blockers, calcium channel blockers, ACE Inhibitors, angiotensin receptor blockers, diuretics, digoxin, and antihyperlipidemics.   Congestive Heart Failure -Discuss the definition of CHF, how to live with CHF, the signs and symptoms of CHF, and how keep track of weight and sodium intake.   Heart Disease and Intimacy -Discus the effect sexual activity has on the heart, how changes occur during intimacy as we age, and safety during sexual activity.   Smoking Cessation / COPD -Discuss different methods to quit smoking, the health benefits of quitting smoking, and the definition of COPD.   Nutrition I: Fats -Discuss the types of cholesterol, what cholesterol does to the heart, and how cholesterol levels can be controlled.   Nutrition II: Labels -Discuss the different components of food labels and how to read food label  CARDIAC REHAB PHASE II EXERCISE from 01/17/2016 in [place] [facility]   Date  12/20/15   Educator  Hart Rochester   Instruction Review Code  2- meets goals/outcomes      Heart Parts and Heart Disease -Discuss the anatomy of the heart, the pathway of blood circulation through the heart, and these are affected by heart disease.      CARDIAC REHAB PHASE II EXERCISE from 01/17/2016 in [place] [facility]   Date  12/27/15   Educator  Hart Rochester   Instruction Review Code  2- meets goals/outcomes      Stress I: Signs and Symptoms -Discuss the causes of stress, how stress may lead to anxiety and depression, and ways to limit stress.      CARDIAC REHAB PHASE II EXERCISE from 01/17/2016 in [place] C[facility]  [facility]   Date  01/03/16   Educator  D  Anikin Prosser   Instruction Review Code  2- meets goals/outcomes      Stress II: Relaxation -Discuss different types of relaxation techniques to limit stress.   Warning Signs of Stroke / TIA -Discuss definition of a stroke, what the signs and symptoms are of a stroke, and how to identify when someone is having stroke.          CARDIAC REHAB PHASE II EXERCISE from 01/17/2016 in [facility]   Date  01/17/16   Educator  D. Kendrew Paci   Instruction Review Code  2- meets goals/outcomes      Knowledge Questionnaire Score:     Knowledge Questionnaire Score - 12/05/15 0846    Knowledge Questionnaire Score   Pre Score 21/28      Core Components/Risk Factors/Patient Goals at Admission:     Personal Goals and Risk Factors at Admission - 12/05/15 0848    Core Components/Risk Factors/Patient Goals on Admission    Weight Management Yes;Obesity   Intervention Obesity: Provide education and appropriate resources to help participant work on and attain dietary goals.;Weight Management: Provide education and appropriate resources to help participant work on and attain dietary goals.   Admit Weight 347 lb (157.398 kg)   Goal Weight: Short Term 330 lb (149.687 kg)   Expected Outcomes Short Term: Continue to assess and modify interventions until short term weight is achieved.;Long Term: Adherence to nutrition and physical activity/exercise program aimed toward attainment of established weight goal.   Tobacco Cessation Yes   Number of packs per day 0.5   Intervention Assist the participant in steps to quit. Provide individualized education and counseling about committing to Tobacco Cessation, relapse prevention, and pharmacological support that can be provided by physician.;Education officer, environmental, assist with locating and accessing local/national Quit Smoking programs, and support quit date choice.   Expected Outcomes Short Term: Will  demonstrate readiness to quit, by selecting a quit date.   Improve shortness of breath with ADL's Yes   Intervention Provide education, individualized exercise plan and daily activity instruction to help decrease symptoms of SOB with activities of daily living.   Expected Outcomes Short Term: Achieves a reduction of symptoms when performing activities of daily living.   Hypertension Yes   Intervention Provide education on lifestyle modifcations including regular physical activity/exercise, weight management, moderate sodium restriction and increased consumption of fresh fruit, vegetables, and low fat dairy, alcohol moderation, and smoking cessation.;Monitor prescription use compliance.   Expected Outcomes Long Term: Maintenance of blood pressure at goal levels.   Lipids Yes   Intervention Provide education and support for  participant on nutrition & aerobic/resistive exercise along with prescribed medications to achieve LDL 70mg , HDL >40mg .   Expected Outcomes Short Term: Participant states understanding of desired cholesterol values and is compliant with medications prescribed. Participant is following exercise prescription and nutrition guidelines.      Core Components/Risk Factors/Patient Goals Review:      Goals and Risk Factor Review      12/18/15 1510 01/19/16 0831         Core Components/Risk Factors/Patient Goals Review   Personal Goals Review Weight Management/Obesity;Improve shortness of breath with ADL's;Hypertension;Lipids;Tobacco Cessation       Review Will continue to education on these topics.        Expected Outcomes  breathe better and lose weight. patient has lost 7 lbs since starting program.          Core Components/Risk Factors/Patient Goals at Discharge (Final Review):      Goals and Risk Factor Review - 01/19/16 0831    Core Components/Risk Factors/Patient Goals Review   Expected Outcomes breathe better and lose weight. patient has lost 7 lbs since starting  program.       ITP Comments:     ITP Comments      12/05/15 0846           ITP Comments Patient is 50 year old African American Male who lives with fiance.  Patient enjoys music.  patient still smokes. Patient not interested in smoking cessation classes. Patient does not exhibit any s/s of depression.           Comments:      ITP Comments      12/05/15 0846           ITP Comments Patient is 50 year old African American Male who lives with fiance.  Patient enjoys music.  patient still smokes. Patient not interested in smoking cessation classes. Patient does not exhibit any s/s of depression.

## 2016-01-22 ENCOUNTER — Encounter (HOSPITAL_COMMUNITY): Payer: Medicaid Other

## 2016-01-24 ENCOUNTER — Encounter (HOSPITAL_COMMUNITY): Payer: Medicaid Other

## 2016-01-26 ENCOUNTER — Encounter (HOSPITAL_COMMUNITY): Payer: Medicaid Other

## 2016-01-29 ENCOUNTER — Encounter (HOSPITAL_COMMUNITY): Payer: Medicaid Other

## 2016-01-31 ENCOUNTER — Encounter (HOSPITAL_COMMUNITY): Payer: Medicaid Other

## 2016-02-02 ENCOUNTER — Encounter (HOSPITAL_COMMUNITY): Payer: Medicaid Other

## 2016-02-02 NOTE — Progress Notes (Signed)
Note Entry 01/26/16. Patient called and quit the Cardiac Rehab Program due to moving and shoulder surgery soon.

## 2016-02-05 ENCOUNTER — Encounter (HOSPITAL_COMMUNITY): Payer: Medicaid Other

## 2016-02-07 ENCOUNTER — Encounter (HOSPITAL_COMMUNITY): Payer: Medicaid Other

## 2016-02-08 ENCOUNTER — Other Ambulatory Visit: Payer: Self-pay | Admitting: Family

## 2016-02-08 NOTE — Telephone Encounter (Signed)
Last seen 4/17  Dr Kathlene NovemberBradshaw   Christy PCP   If approved route to nurse to call into [facility]

## 2016-02-09 ENCOUNTER — Encounter (HOSPITAL_COMMUNITY): Payer: Medicaid Other

## 2016-02-09 NOTE — Telephone Encounter (Signed)
[facility] 

## 2016-02-12 ENCOUNTER — Encounter (HOSPITAL_COMMUNITY): Payer: Medicaid Other

## 2016-02-14 ENCOUNTER — Encounter (HOSPITAL_COMMUNITY): Payer: Medicaid Other

## 2016-02-16 ENCOUNTER — Encounter (HOSPITAL_COMMUNITY): Payer: Medicaid Other

## 2016-02-19 ENCOUNTER — Encounter (HOSPITAL_COMMUNITY): Payer: Medicaid Other

## 2016-02-19 IMAGING — DX DG CHEST 2V
2 series · 2 of 2 positions shown · non-contrast
Comparison: Chest radiograph performed 05/20/2006

CLINICAL DATA: Sharp central chest pain for 1 week. Initial
encounter.

EXAM:
CHEST  2 VIEW

[chest pa]
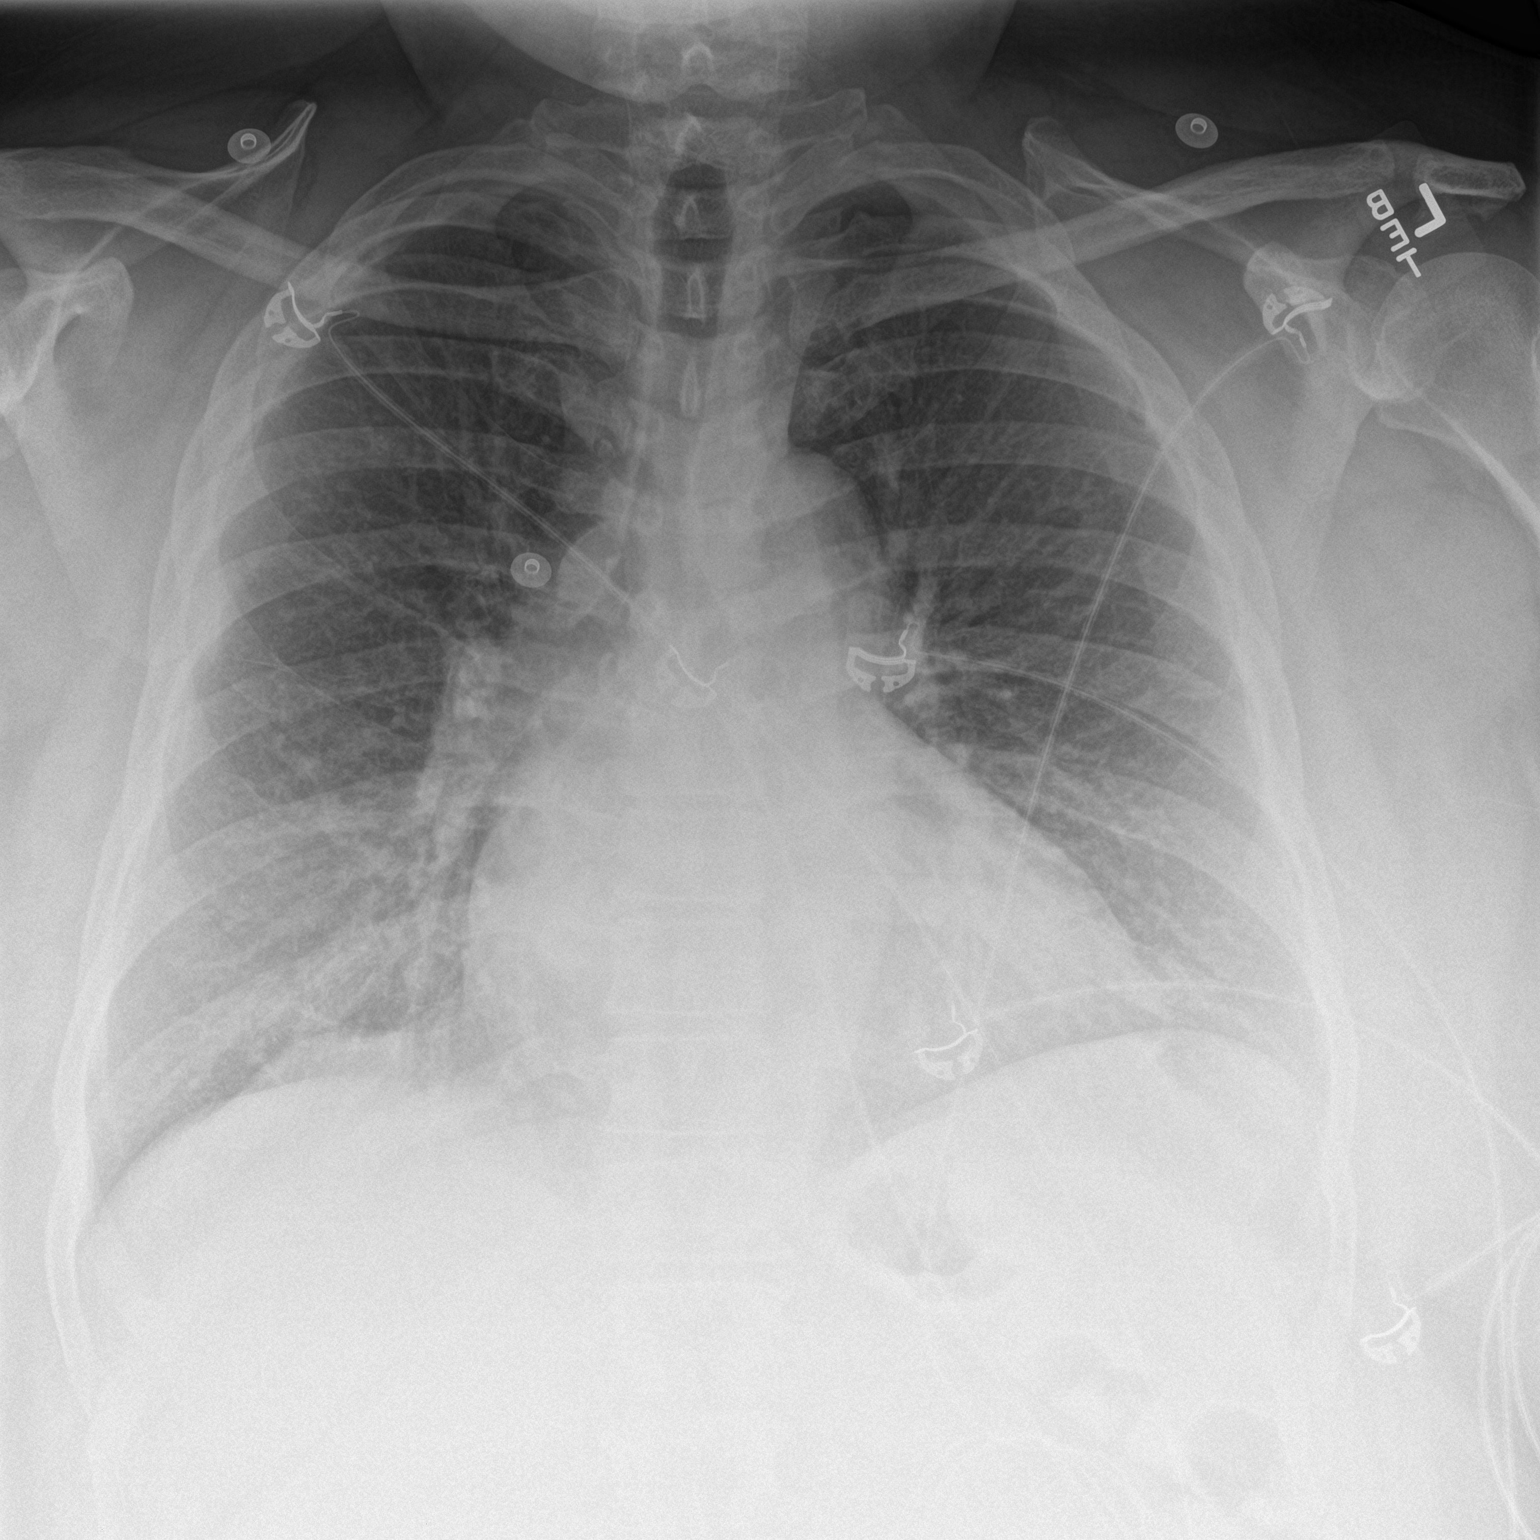

[chest lat]
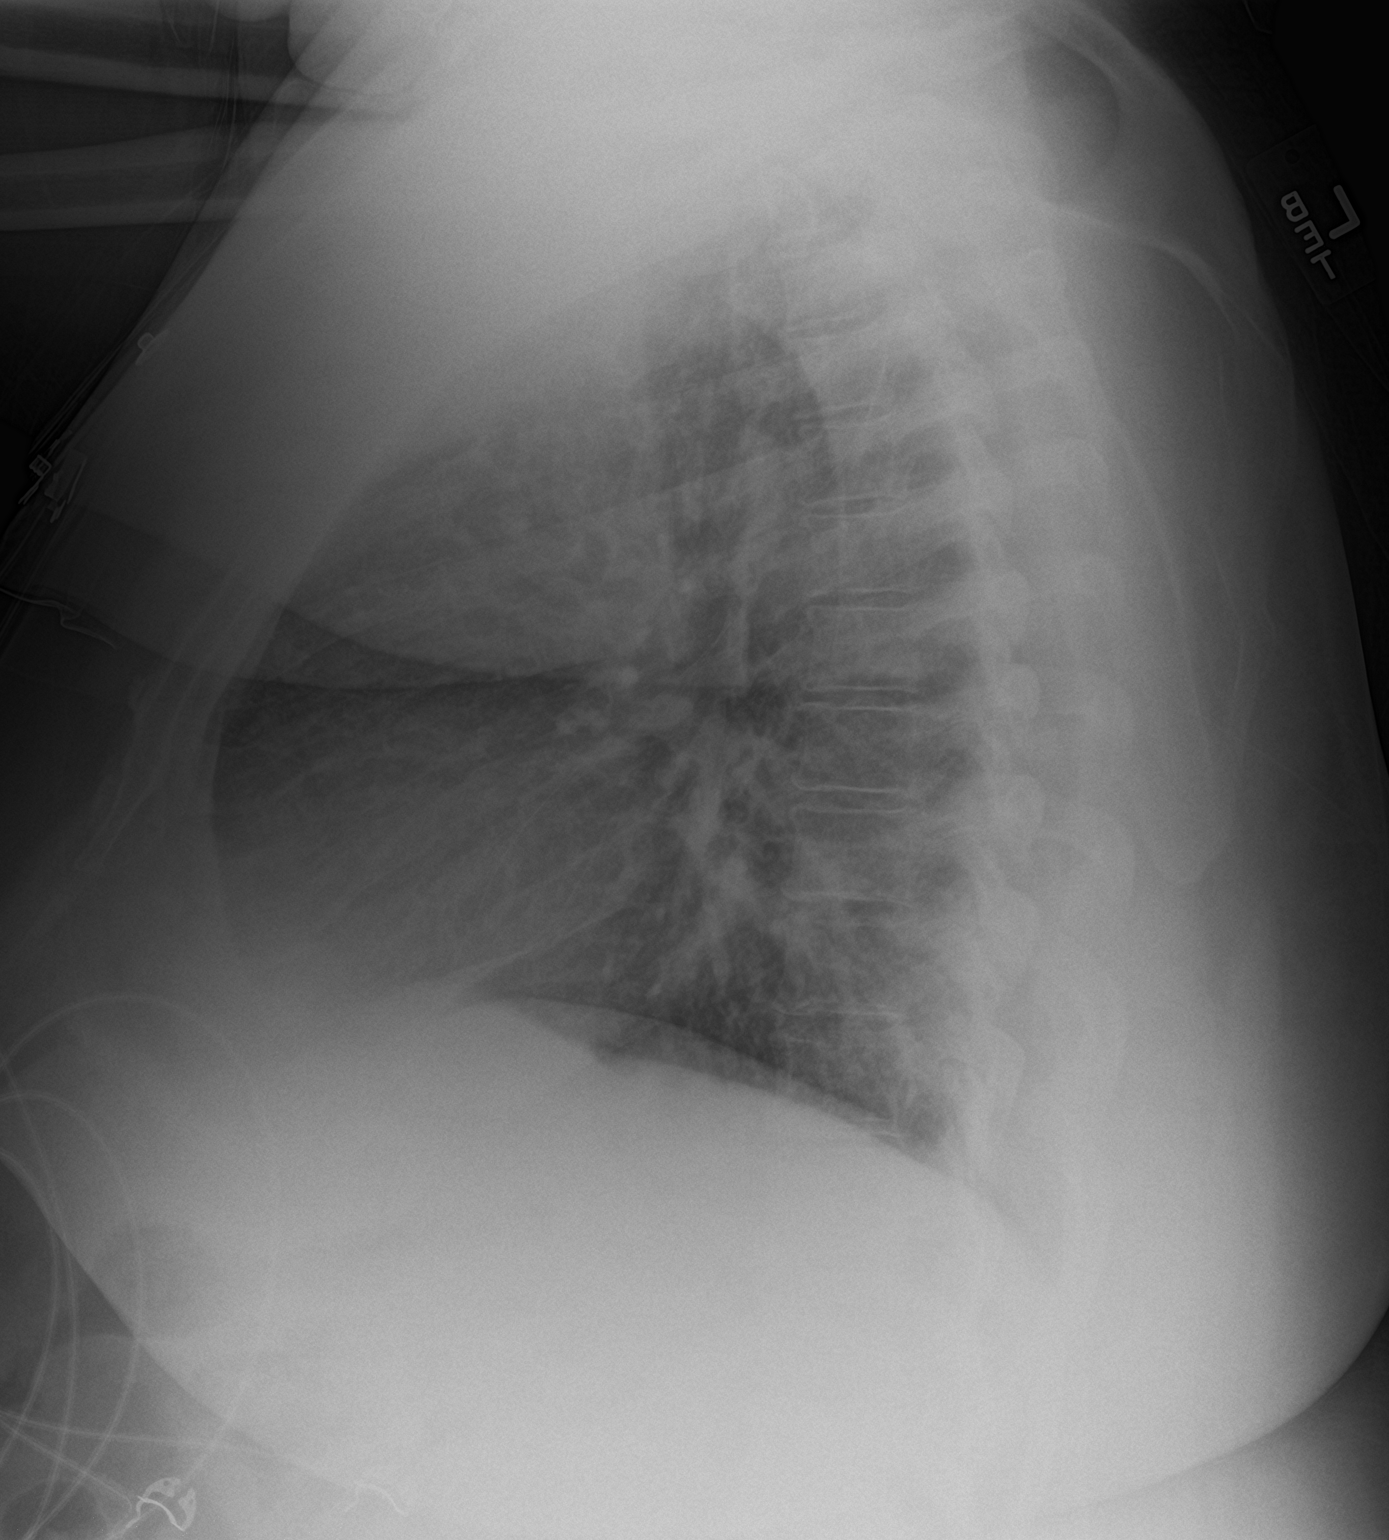

[2 of 2 positions shown; findings below may reference images not displayed]

FINDINGS: The lungs are well-aerated. Mild bibasilar opacities may reflect
mild interstitial edema, given underlying vascular congestion. There
is no evidence of pleural effusion or pneumothorax.

The heart is borderline normal in size. No acute osseous
abnormalities are seen.
IMPRESSION: Mild bibasilar airspace opacities may reflect mild interstitial
edema, given underlying vascular congestion.

## 2016-02-21 ENCOUNTER — Encounter (HOSPITAL_COMMUNITY): Payer: Medicaid Other

## 2016-02-22 NOTE — Addendum Note (Signed)
Encounter addended by: Andrey Cotahristy M Haizlee Henton, RN on: 02/22/2016  4:38 PM<BR>     Documentation filed: Clinical Notes

## 2016-02-22 NOTE — Progress Notes (Signed)
Discharge Summary  Patient Details  Name: Chris Powell MRN: [id number] Date of Birth: 1966-07-30 Referring Provider:     Number of Visits: 14  Reason for Discharge:  Early Exit:  Personal  Smoking History:  History  Smoking status  . Current Every Day Smoker -- 0.50 packs/day for 30 years  . Types: Cigarettes  Smokeless tobacco  . Never Used    Comment: " I have  tried vapor cigarettes"    Diagnosis:  No diagnosis found.  ADL UCSD:   Initial Exercise Prescription:     Initial Exercise Prescription - 12/05/15 1200    Date of Initial Exercise RX and Referring Provider   Date 12/05/15   Treadmill   MPH 1.8   Grade 0   Minutes 15   METs 2.38   NuStep   Level 2   Minutes 15   METs 1.5   Arm Ergometer   Level 1.5   Minutes 15   METs 1.5   Prescription Details   Frequency (times per week) 3   Intensity   THRR REST +  30   THRR 40-80% of Max Heartrate 113-132   Ratings of Perceived Exertion 11-13   Progression   Progression Continue to progress workloads to maintain intensity without signs/symptoms of physical distress.   Resistance Training   Training Prescription Yes   Weight 1.0   Reps 10-12      Discharge Exercise Prescription (Final Exercise Prescription Changes):     Exercise Prescription Changes - 01/19/16 1300    Exercise Review   Progression Yes   Response to Exercise   Blood Pressure (Admit) 130/80 mmHg   Blood Pressure (Exercise) 130/70 mmHg   Blood Pressure (Exit) 128/74 mmHg   Heart Rate (Admit) 60 bpm   Heart Rate (Exercise) 92 bpm   Heart Rate (Exit) 69 bpm   Rating of Perceived Exertion (Exercise) 12   Symptoms No   Duration Progress to 30 minutes of continuous aerobic without signs/symptoms of physical distress   Intensity Rest + 30   Progression   Progression Continue to progress workloads to maintain intensity without signs/symptoms of physical distress.   Resistance Training   Training Prescription Yes   Weight 3.0   Reps 10-12   Treadmill   MPH 2.2   Grade 0   Minutes 15   METs 2.7   NuStep   Level 2   Minutes 15   METs 2.4   Home Exercise Plan   Plans to continue exercise at Home   Frequency Add 2 additional days to program exercise sessions.      Functional Capacity:     6 Minute Walk      12/05/15 0822       6 Minute Walk   Phase Initial     Distance 1450 feet     Walk Time 6 minutes     # of Rest Breaks 0     MPH 2.75     METS 3.11     RPE 14     Perceived Dyspnea  13     VO2 Peak 11.64     Symptoms Yes (comment)     Comments Chronic knee pain. Patient gave rating of 10 on scale of 1 to 10. Patient stated pain was not exercise limiting.     Resting HR 94 bpm     Resting BP 140/80 mmHg     Max Ex. HR 121 bpm     Max Ex. BP 172/94  mmHg     2 Minute Post BP 142/88 mmHg     Pre/Post BP   Baseline BP 140/80 mmHg     6 Minute BP 172/94 mmHg     Pre/Post BP? Yes        Psychological, QOL, Others - Outcomes: PHQ 2/9: [facility] 01/11/2016 12/05/2015 09/12/2015 07/13/2015 02/28/2015  Decreased Interest 0 1 0 1 0  Down, Depressed, Hopeless 0 0 0 1 0  PHQ - 2 Score 0 1 0 2 0  Altered sleeping - 0 - 2 -  Tired, decreased energy - 1 - 1 -  Change in appetite - 0 - 1 -  Feeling bad or failure about yourself  - 0 - 0 -  Trouble concentrating - 0 - 1 -  Moving slowly or fidgety/restless - 0 - 0 -  Suicidal thoughts - 0 - 0 -  PHQ-9 Score - 2 - 7 -  Difficult doing work/chores - Somewhat difficult - - -    Quality of Life:     Quality of Life - 12/05/15 1307    Quality of Life Scores   Health/Function Pre 16.53 %   Socioeconomic Pre 14.06 %   Psych/Spiritual Pre 18.93 %   Family Pre 21.2 %   GLOBAL Pre 17.11 %      Personal Goals: Goals established at orientation with interventions provided to work toward goal.     Personal Goals and Risk Factors at Admission - 12/05/15 0848    Core Components/Risk Factors/Patient Goals on Admission    Weight  Management Yes;Obesity   Intervention Obesity: Provide education and appropriate resources to help participant work on and attain dietary goals.;Weight Management: Provide education and appropriate resources to help participant work on and attain dietary goals.   Admit Weight 347 lb (157.398 kg)   Goal Weight: Short Term 330 lb (149.687 kg)   Expected Outcomes Short Term: Continue to assess and modify interventions until short term weight is achieved.;Long Term: Adherence to nutrition and physical activity/exercise program aimed toward attainment of established weight goal.   Tobacco Cessation Yes   Number of packs per day 0.5   Intervention Assist the participant in steps to quit. Provide individualized education and counseling about committing to Tobacco Cessation, relapse prevention, and pharmacological support that can be provided by physician.;Education officer, environmentalffer self-teaching materials, assist with locating and accessing local/national Quit Smoking programs, and support quit date choice.   Expected Outcomes Short Term: Will demonstrate readiness to quit, by selecting a quit date.   Improve shortness of breath with ADL's Yes   Intervention Provide education, individualized exercise plan and daily activity instruction to help decrease symptoms of SOB with activities of daily living.   Expected Outcomes Short Term: Achieves a reduction of symptoms when performing activities of daily living.   Hypertension Yes   Intervention Provide education on lifestyle modifcations including regular physical activity/exercise, weight management, moderate sodium restriction and increased consumption of fresh fruit, vegetables, and low fat dairy, alcohol moderation, and smoking cessation.;Monitor prescription use compliance.   Expected Outcomes Long Term: Maintenance of blood pressure at goal levels.   Lipids Yes   Intervention Provide education and support for participant on nutrition & aerobic/resistive exercise along with  prescribed medications to achieve LDL 70mg , HDL >40mg .   Expected Outcomes Short Term: Participant states understanding of desired cholesterol values and is compliant with medications prescribed. Participant is following exercise prescription and nutrition guidelines.       Personal Goals Discharge:  Goals and Risk Factor Review      12/18/15 1510 01/19/16 0831         Core Components/Risk Factors/Patient Goals Review   Personal Goals Review Weight Management/Obesity;Improve shortness of breath with ADL's;Hypertension;Lipids;Tobacco Cessation       Review Will continue to education on these topics.        Expected Outcomes  breathe better and lose weight. patient has lost 7 lbs since starting program.          Nutrition & Weight - Outcomes:     Pre Biometrics - 12/05/15 1246    Pre Biometrics   Height 5\' 7"  (1.702 m)   Weight (!) 347 lb (157.398 kg)   Waist Circumference 55 inches   Hip Circumference 60 inches   Waist to Hip Ratio 0.92 %   BMI (Calculated) 54.5   Triceps Skinfold 20 mm   % Body Fat 45.8 %   Grip Strength 132 kg   Flexibility 0 in   Single Leg Stand 25 seconds       Nutrition:     Nutrition Therapy & Goals - 12/05/15 0847    Intervention Plan   Intervention Nutrition handout(s) given to patient.   Expected Outcomes Short Term Goal: Understand basic principles of dietary content, such as calories, fat, sodium, cholesterol and nutrients.;Long Term Goal: Adherence to prescribed nutrition plan.      Nutrition Discharge:     Nutrition Assessments - 12/05/15 0848    MEDFICTS Scores   Pre Score 68      Education Questionnaire Score:     Knowledge Questionnaire Score - 12/05/15 0846    Knowledge Questionnaire Score   Pre Score 21/28

## 2016-02-23 ENCOUNTER — Encounter (HOSPITAL_COMMUNITY): Payer: Medicaid Other

## 2016-02-26 ENCOUNTER — Encounter (HOSPITAL_COMMUNITY): Payer: Medicaid Other

## 2016-02-28 ENCOUNTER — Encounter (HOSPITAL_COMMUNITY): Payer: Medicaid Other

## 2016-03-01 ENCOUNTER — Encounter (HOSPITAL_COMMUNITY): Payer: Medicaid Other

## 2016-03-11 ENCOUNTER — Encounter: Payer: Self-pay | Admitting: Family

## 2016-03-11 ENCOUNTER — Ambulatory Visit (INDEPENDENT_AMBULATORY_CARE_PROVIDER_SITE_OTHER): Payer: Medicaid Other | Admitting: Family

## 2016-03-11 VITALS — BP 135/85 | HR 87 | Temp 97.8°F | Resp 16 | Ht 67.0 in | Wt 347.6 lb

## 2016-03-11 DIAGNOSIS — J069 Acute upper respiratory infection, unspecified: Secondary | ICD-10-CM

## 2016-03-11 DIAGNOSIS — J029 Acute pharyngitis, unspecified: Secondary | ICD-10-CM

## 2016-03-11 LAB — RAPID STREP SCREEN (MED CTR MEBANE ONLY): STREP GP A AG, IA W/REFLEX: NEGATIVE

## 2016-03-11 LAB — CULTURE, GROUP A STREP

## 2016-03-11 MED ORDER — FLUTICASONE PROPIONATE 50 MCG/ACT NA SUSP: 2.0000 | Freq: Every day | NASAL | Status: DC

## 2016-03-11 MED ORDER — AZITHROMYCIN 250 MG PO TABS: ORAL_TABLET | ORAL | Status: DC

## 2016-03-11 NOTE — Progress Notes (Signed)
Subjective:    Patient ID: Chris Powell, male    DOB: 1966/02/18, 50 y.o.   MRN: [id number]  Sore Throat  This is a new problem. The current episode started 1 to 4 weeks ago. The problem has been waxing and waning. The pain is worse on the right side. There has been no fever. The pain is at a severity of 6/10. The pain is moderate. Associated symptoms include congestion, coughing, headaches, a hoarse voice, shortness of breath, swollen glands and trouble swallowing. Pertinent negatives include no ear discharge, ear pain or neck pain. He has had no exposure to strep. He has tried acetaminophen and gargles for the symptoms. The treatment provided mild relief.      Review of Systems  Constitutional: Negative.   HENT: Positive for congestion, hoarse voice and trouble swallowing. Negative for ear discharge and ear pain.   Respiratory: Positive for cough and shortness of breath.   Cardiovascular: Negative.   Gastrointestinal: Negative.   Endocrine: Negative.   Genitourinary: Negative.   Musculoskeletal: Negative.  Negative for neck pain.  Neurological: Positive for headaches.  Hematological: Negative.   Psychiatric/Behavioral: Negative.   All other systems reviewed and are negative.      Objective:   Physical Exam  Constitutional: He is oriented to person, place, and time. He appears well-developed and well-nourished. No distress.  HENT:  Head: Normocephalic.  Right Ear: External ear normal.  Left Ear: External ear normal.  Nose: Nose normal.  Mouth/Throat: Oropharynx is clear and moist.  Eyes: Pupils are equal, round, and reactive to light. Right eye exhibits no discharge. Left eye exhibits no discharge.  Neck: Normal range of motion. Neck supple. No thyromegaly present.  Cardiovascular: Normal rate, regular rhythm, normal heart sounds and intact distal pulses.   No murmur heard. Pulmonary/Chest: Effort normal and breath sounds normal. No respiratory distress. He has no  wheezes.  Abdominal: Soft. Bowel sounds are normal. He exhibits no distension. There is no tenderness.  Musculoskeletal: Normal range of motion. He exhibits no edema or tenderness.  Neurological: He is alert and oriented to person, place, and time.  Skin: Skin is warm and dry. No rash noted. No erythema.  Psychiatric: He has a normal mood and affect. His behavior is normal. Judgment and thought content normal.  Vitals reviewed.   BP 135/85 mmHg  Pulse 87  Temp(Src) 97.8 F (36.6 C) (Oral)  Resp 16  Ht  (1.702 m)  Wt 347 lb 9.6 oz (157.67 kg)  BMI 54.43 kg/m2       Assessment & Plan:  1. Sore throat - Rapid strep screen (not at [facility])  2. Acute upper respiratory infection - azithromycin (ZITHROMAX Z-PAK) 250 MG tablet; As directed  Dispense: 1 each; Refill: 0 - fluticasone (FLONASE) 50 MCG/ACT nasal spray; Place 2 sprays into both nostrils daily.  Dispense: 16 g; Refill: 6 - Rapid strep screen (not at [facility]) - Take meds as prescribed - Use a cool mist humidifier  -Use saline nose sprays frequently -Saline irrigations of the nose can be very helpful if done frequently.  * 4X daily for 1 week*  * Use of a nettie pot can be helpful with this. Follow directions with this* -Force fluids -For any cough or congestion  Use plain Mucinex- regular strength or max strength is fine   * Children- consult with Pharmacist for dosing -For fever or aces or pains- take tylenol or ibuprofen appropriate for age and weight.  * for fevers greater  than 101 orally you may alternate ibuprofen and tylenol every  3 hours. -Throat lozenges if help -New toothbrush in 3 days   Jannifer Rodneyhristy Gaege Sangalang, FNP

## 2016-03-11 NOTE — Patient Instructions (Signed)
Upper Respiratory Infection, Adult Most upper respiratory infections (URIs) are a viral infection of the air passages leading to the lungs. A URI affects the nose, throat, and upper air passages. The most common type of URI is nasopharyngitis and is typically referred to as "the common cold." URIs run their course and usually go away on their own. Most of the time, a URI does not require medical attention, but sometimes a bacterial infection in the upper airways can follow a viral infection. This is called a secondary infection. Sinus and middle ear infections are common types of secondary upper respiratory infections. Bacterial pneumonia can also complicate a URI. A URI can worsen asthma and chronic obstructive pulmonary disease (COPD). Sometimes, these complications can require emergency medical care and may be life threatening.  CAUSES Almost all URIs are caused by viruses. A virus is a type of germ and can spread from one person to another.  RISKS FACTORS You may be at risk for a URI if:   You smoke.   You have chronic heart or lung disease.  You have a weakened defense (immune) system.   You are very young or very old.   You have nasal allergies or asthma.  You work in crowded or poorly ventilated areas.  You work in health care facilities or schools. SIGNS AND SYMPTOMS  Symptoms typically develop 2-3 days after you come in contact with a cold virus. Most viral URIs last 7-10 days. However, viral URIs from the influenza virus (flu virus) can last 14-18 days and are typically more severe. Symptoms may include:   Runny or stuffy (congested) nose.   Sneezing.   Cough.   Sore throat.   Headache.   Fatigue.   Fever.   Loss of appetite.   Pain in your forehead, behind your eyes, and over your cheekbones (sinus pain).  Muscle aches.  DIAGNOSIS  Your health care provider may diagnose a URI by:  Physical exam.  Tests to check that your symptoms are not due to  another condition such as:  Strep throat.  Sinusitis.  Pneumonia.  Asthma. TREATMENT  A URI goes away on its own with time. It cannot be cured with medicines, but medicines may be prescribed or recommended to relieve symptoms. Medicines may help:  Reduce your fever.  Reduce your cough.  Relieve nasal congestion. HOME CARE INSTRUCTIONS   Take medicines only as directed by your health care provider.   Gargle warm saltwater or take cough drops to comfort your throat as directed by your health care provider.  Use a warm mist humidifier or inhale steam from a shower to increase air moisture. This may make it easier to breathe.  Drink enough fluid to keep your urine clear or pale yellow.   Eat soups and other clear broths and maintain good nutrition.   Rest as needed.   Return to work when your temperature has returned to normal or as your health care provider advises. You may need to stay home longer to avoid infecting others. You can also use a face mask and careful hand washing to prevent spread of the virus.  Increase the usage of your inhaler if you have asthma.   Do not use any tobacco products, including cigarettes, chewing tobacco, or electronic cigarettes. If you need help quitting, ask your health care provider. PREVENTION  The best way to protect yourself from getting a cold is to practice good hygiene.   Avoid oral or hand contact with people with cold   symptoms.   Wash your hands often if contact occurs.  There is no clear evidence that vitamin C, vitamin E, echinacea, or exercise reduces the chance of developing a cold. However, it is always recommended to get plenty of rest, exercise, and practice good nutrition.  SEEK MEDICAL CARE IF:   You are getting worse rather than better.   Your symptoms are not controlled by medicine.   You have chills.  You have worsening shortness of breath.  You have brown or red mucus.  You have yellow or brown nasal  discharge.  You have pain in your face, especially when you bend forward.  You have a fever.  You have swollen neck glands.  You have pain while swallowing.  You have white areas in the back of your throat. SEEK IMMEDIATE MEDICAL CARE IF:   You have severe or persistent:  Headache.  Ear pain.  Sinus pain.  Chest pain.  You have chronic lung disease and any of the following:  Wheezing.  Prolonged cough.  Coughing up blood.  A change in your usual mucus.  You have a stiff neck.  You have changes in your:  Vision.  Hearing.  Thinking.  Mood. MAKE SURE YOU:   Understand these instructions.  Will watch your condition.  Will get help right away if you are not doing well or get worse.   This information is not intended to replace advice given to you by your health care provider. Make sure you discuss any questions you have with your health care provider.   Document Released: 03/05/2001 Document Revised: 01/24/2015 Document Reviewed: 12/15/2013 Elsevier Interactive Patient Education 2016 [employer]  - Take meds as prescribed - Use a cool mist humidifier  -Use saline nose sprays frequently -Saline irrigations of the nose can be very helpful if done frequently.  * 4X daily for 1 week*  * Use of a nettie pot can be helpful with this. Follow directions with this* -Force fluids -For any cough or congestion  Use plain Mucinex- regular strength or max strength is fine   * Children- consult with Pharmacist for dosing -For fever or aces or pains- take tylenol or ibuprofen appropriate for age and weight.  * for fevers greater than 101 orally you may alternate ibuprofen and tylenol every  3 hours. -Throat lozenges if help -New toothbrush in 3 days   Symphany Fleissner, FNP  

## 2016-04-01 ENCOUNTER — Ambulatory Visit (INDEPENDENT_AMBULATORY_CARE_PROVIDER_SITE_OTHER): Payer: Medicaid Other | Admitting: Family Medicine

## 2016-04-01 ENCOUNTER — Encounter: Payer: Self-pay | Admitting: Family Medicine

## 2016-04-01 VITALS — BP 141/82 | HR 80 | Temp 97.3°F | Ht 67.0 in | Wt 345.2 lb

## 2016-04-01 DIAGNOSIS — E785 Hyperlipidemia, unspecified: Secondary | ICD-10-CM

## 2016-04-01 DIAGNOSIS — I10 Essential (primary) hypertension: Secondary | ICD-10-CM | POA: Diagnosis not present

## 2016-04-01 DIAGNOSIS — G894 Chronic pain syndrome: Secondary | ICD-10-CM | POA: Diagnosis not present

## 2016-04-01 MED ORDER — GABAPENTIN 300 MG PO CAPS: 300.0000 mg | ORAL_CAPSULE | Freq: Three times a day (TID) | ORAL | Status: DC

## 2016-04-01 NOTE — Progress Notes (Signed)
   HPI  Patient presents today here for follow-up hypertension, hyperlipidemia, chronic back pain  Hypertension Good medication compliance, no side effects. Exercise limited by chronic pain.  HLD Tolerating atorvastatin easily. Watching his diet  Chronic pain Would like refilled gabapentin States that it helps a bit when he has sciatic flares. No side effects He has been significant pain management clinic who recently did a Holter monitor  PMH: Smoking status noted ROS: Per HPI  Objective: BP 141/82 mmHg  Pulse 80  Temp(Src) 97.3 F (36.3 C) (Oral)  Ht 5\' 7"  (1.702 m)  Wt 345 lb 3.2 oz (156.582 kg)  BMI 54.05 kg/m2 Gen: NAD, alert, cooperative with exam HEENT: NCAT CV: RRR, good S1/S2, no murmur Resp: CTABL, no wheezes, non-labored Ext: No edema, warm Neuro: Alert and oriented, No gross deficits  Assessment and plan:  # Hypertension Reasonable control On several medications including spirono, clonidine, amlodipine, lisinopril, hydralazine, metoprolol No changes made today  # HLD Plan recheck annually Reviewed diet, has good compliance  # Chronic back pain, chronic pain syndrome Refilled gabapentin, I am happy use getting good results with such a low dose. Pain medications per pain management.    Murtis SinkSam Abelardo Seidner, MD [facility] 04/01/2016, 10:00 AM

## 2016-04-10 ENCOUNTER — Encounter: Payer: Self-pay | Admitting: Cardiovascular Disease

## 2016-04-10 ENCOUNTER — Ambulatory Visit (INDEPENDENT_AMBULATORY_CARE_PROVIDER_SITE_OTHER): Payer: Medicaid Other | Admitting: Cardiovascular Disease

## 2016-04-10 VITALS — BP 140/112 | HR 78 | Ht 67.0 in | Wt 348.8 lb

## 2016-04-10 DIAGNOSIS — I10 Essential (primary) hypertension: Secondary | ICD-10-CM

## 2016-04-10 DIAGNOSIS — Z9861 Coronary angioplasty status: Secondary | ICD-10-CM | POA: Diagnosis not present

## 2016-04-10 DIAGNOSIS — I2510 Atherosclerotic heart disease of native coronary artery without angina pectoris: Secondary | ICD-10-CM

## 2016-04-10 MED ORDER — CARVEDILOL 25 MG PO TABS: 25.0000 mg | ORAL_TABLET | Freq: Two times a day (BID) | ORAL | Status: DC

## 2016-04-10 NOTE — Patient Instructions (Signed)
Medication Instructions:  STOP Toprol (Metoprolol) START Carvedilol (Coreg) 25 mg twice daily   Labwork: None Ordered   Testing/Procedures: None Ordered   Follow-Up: Your physician wants you to follow-up in: 6 months with Dr. Elease HashimotoNahser.  You will receive a reminder letter in the mail two months in advance. If you don't receive a letter, please call our office to schedule the follow-up appointment.   If you need a refill on your cardiac medications before your next appointment, please call your pharmacy.   Thank you for choosing [facility]! Eligha BridegroomMichelle Chanon Loney, RN [phone]

## 2016-04-10 NOTE — Progress Notes (Signed)
Cardiology Office Note   Date:  04/10/2016   ID:  Chris Powell, DOB [birth year], MRN [id number]  PCP:  Jannifer Rodney, FNP  Cardiologist:  Leodis Sias, M.D.  Chief Complaint: High blood pressure  Problem list: 1. Coronary artery disease-status post PTCA and stenting 2. Hypertension 3. COPD 4. Obstructive sleep apnea    History of Present Illness: Chris Powell is a 50 y.o. male who presents for post hospital follow-up. He was admitted to the hospital with NSTEMI 12/21/14 treated with drug-eluting stent to the LAD. Echo showed moderate LVH EF 55-65%. He had hypokalemia that was treated. He also has history of COPD, hypertension, obstructive sleep apnea on CPap.  Patient comes in today for follow-up. His blood pressure is elevated. He thinks it's related to allergies. He is doing cardiac rehabilitation at [facility] and has lost 9 pounds. He is having trouble quitting smoking. He is down to half a pack a day but it is very hard for him. He is trying to use nicotine patches. Overall he feels so much better. He denies chest pain, palpitations, dyspnea, dyspnea on exertion, dizziness or presyncope.  Jan. 19, 2017:  Doing well.  Is not sleeping well at night. Has to urinate all night  - 3-5 times a night.    April 10, 2016:  BP is up today  Taking his meds Still eating high salt foods on occasion . ( ham, hot dogs, )  Is trying to lose weight .  Gets only a little exercise.     Does not work - on [employer] ,  Mows his grass.    Past Medical History  Diagnosis Date  . Hypertension   . COPD (chronic obstructive pulmonary disease) (HCC)   . Asthma   . Arthritis   . Coronary artery disease   . Sleep apnea     USES CPAP  . Shortness of breath dyspnea   . Pneumonia     HX OF PNA  . Depression   . GERD (gastroesophageal reflux disease)   . Myocardial infarction (HCC)   . Hyperlipidemia     Past Surgical History  Procedure Laterality Date  . Knee surgery    .  Coronary stent placement  12/22/2014    LAD  . Cardiac catheterization  12/22/2014  . Left heart catheterization with coronary angiogram N/A 12/22/2014    Procedure: LEFT HEART CATHETERIZATION WITH CORONARY ANGIOGRAM;  Surgeon: Corky Crafts, MD;  Location: [facility] CATH LAB;  Service: Cardiovascular;  Laterality: N/A;     Current Outpatient Prescriptions  Medication Sig Dispense Refill  . amLODipine (NORVASC) 10 MG tablet Take 1 tablet (10 mg total) by mouth daily. 90 tablet 1  . aspirin EC 81 MG tablet Take 81 mg by mouth daily.    Marland Kitchen atorvastatin (LIPITOR) 80 MG tablet Take 1 tablet (80 mg total) by mouth daily at 6 PM. 30 tablet 11  . Cholecalciferol 2000 UNITS CAPS Take 2,000 Units by mouth daily.     . cloNIDine (CATAPRES) 0.3 MG tablet Take 0.3 mg by mouth 2 (two) times daily.     . cyclobenzaprine (FLEXERIL) 10 MG tablet Take 1 tablet (10 mg total) by mouth 3 (three) times daily as needed for muscle spasms. 60 tablet 3  . fluticasone (FLONASE) 50 MCG/ACT nasal spray Place 2 sprays into both nostrils daily. 16 g 6  . furosemide (LASIX) 40 MG tablet Take 1 tablet (40 mg total) by mouth daily. 30 tablet 11  . gabapentin (  NEURONTIN) 300 MG capsule Take 1 capsule (300 mg total) by mouth 3 (three) times daily. 90 capsule 5  . hydrALAZINE (APRESOLINE) 50 MG tablet Take 1 tablet (50 mg total) by mouth 3 (three) times daily. (Patient taking differently: Take 50 mg by mouth 3 (three) times daily. Takes two to three times daily (3 times daily if at home).) 90 tablet 3  . ibuprofen (ADVIL,MOTRIN) 800 MG tablet TAKE 1 TABLET (800 MG TOTAL) BY MOUTH EVERY 8 (EIGHT) HOURS AS NEEDED. 30 tablet 2  . lisinopril (PRINIVIL,ZESTRIL) 40 MG tablet Take 1 tablet (40 mg total) by mouth daily. (Patient taking differently: Take 40 mg by mouth 2 (two) times daily. ) 90 tablet 3  . metoprolol succinate (TOPROL-XL) 50 MG 24 hr tablet Take 1 tablet (50 mg total) by mouth daily. Take with or immediately following a  meal. 90 tablet 3  . mometasone (NASONEX) 50 MCG/ACT nasal spray Place 2 sprays into the nose daily. (Patient taking differently: Place 2 sprays into the nose daily as needed (Allergies). ) 17 g 12  . Nicotine Polacrilex (RA NICOTINE GUM MT) Use as directed 1 each in the mouth or throat as needed (Smoking Cessation).    . nitroGLYCERIN (NITROSTAT) 0.4 MG SL tablet Place 1 tablet (0.4 mg total) under the tongue every 5 (five) minutes as needed for chest pain. 25 tablet 2  . omeprazole (PRILOSEC) 20 MG capsule Take 20 mg by mouth daily.    Marland Kitchen oxycodone (OXY-IR) 5 MG capsule Take 10 mg by mouth 3 (three) times daily as needed. Reported on 09/27/2015    . potassium chloride SA (K-DUR,KLOR-CON) 20 MEQ tablet Take 1 tablet (20 mEq total) by mouth 2 (two) times daily. (Patient taking differently: Take 20 mEq by mouth daily. ) 30 tablet 11  . prasugrel (EFFIENT) 10 MG TABS tablet Take 1 tablet (10 mg total) by mouth daily. 30 tablet 1  . sildenafil (REVATIO) 20 MG tablet TAKE 2-5 TABLETS AS NEEDED PRIOR TO SEXUAL ACTIVITY 50 tablet 0  . spironolactone (ALDACTONE) 50 MG tablet Take 1 tablet (50 mg total) by mouth daily. 90 tablet 3  . tiotropium (SPIRIVA) 18 MCG inhalation capsule Place 18 mcg into inhaler and inhale daily as needed (Pt states he does not use daily but uses as he feels he needs it.).     No current facility-administered medications for this visit.    Allergies:   Review of patient's allergies indicates no known allergies.    Social History:  The patient  reports that he has been smoking Cigarettes.  He has a 15 pack-year smoking history. He has never used smokeless tobacco. He reports that he drinks alcohol. He reports that he uses illicit drugs (Cocaine).   Family History:  The patient's family history includes Cancer in his mother and sister; Diabetes in his father; Heart attack in his father; Hyperlipidemia in his mother; Hypertension in his mother.    ROS:  Please see the history of  present illness.   Otherwise, review of systems are positive for none.   All other systems are reviewed and negative.    PHYSICAL EXAM: VS:  BP 140/112 mmHg  Pulse 78  Ht  (1.702 m)  Wt 348 lb 12.8 oz (158.215 kg)  BMI 54.62 kg/m2 , BMI Body mass index is 54.62 kg/(m^2). GEN: Obese, well developed, in no acute distress Neck: no JVD, HJR, carotid bruits, or masses Cardiac: RRR; distant heart sounds, no murmurs,gallop, rubs, thrill or heave,  Respiratory:  clear to auscultation bilaterally, normal work of breathing GI: soft, nontender, nondistended, + BS MS: no deformity or atrophy Extremities: Right arm at cath site without hematoma or hemorrhage, good radial and brachial pulses, lower extremities without cyanosis, clubbing, edema, good distal pulses bilaterally.  Skin: warm and dry, no rash Neuro:  Strength and sensation are intact    EKG:  EKG is ordered today. The ekg ordered today demonstrates normal sinus rhythm at 78 with nonspecific T-wave abnormality, no acute change   Recent Labs: 08/29/2015: ALT 22 11/22/2015: BUN 10; Creat 0.95; Potassium 4.0; Sodium 138    Lipid Panel    Component Value Date/Time   CHOL 149 08/29/2015 1107   CHOL 113 01/25/2015 0015   TRIG 149 08/29/2015 1107   HDL 32* 08/29/2015 1107   HDL 33* 01/25/2015 0015   CHOLHDL 4.7 08/29/2015 1107   CHOLHDL 3.4 01/25/2015 0015   VLDL 16 01/25/2015 0015   LDLCALC 87 08/29/2015 1107   LDLCALC 64 01/25/2015 0015      Wt Readings from Last 3 Encounters:  04/10/16 348 lb 12.8 oz (158.215 kg)  04/01/16 345 lb 3.2 oz (156.582 kg)  03/11/16 347 lb 9.6 oz (157.67 kg)      Other studies Reviewed: Additional studies/ records that were reviewed today include and review of the records demonstrates:  2-D echo 12/23/14 Study Conclusions  - Left ventricle: The cavity size was normal. Wall thickness was   increased in a pattern of moderate LVH. Systolic function was   normal. The estimated ejection  fraction was in the range of 55%   to 65%. - Left atrium: The atrium was moderately dilated.  Cardiac catheterization 12/22/14 IMPRESSIONS:    1. Widely patent  left main coronary artery. 2. Severe disease in the mid  left anterior descending artery with moderate disease further distal. The 95% stenosis was treated with a 2.5 x 24 Synergy drug-eluting stent, postdilated with a 3.0 noncompliant balloon. 3. Mild to moderate disease in the  left circumflex artery and its branches. 4. Mild to moderate disease in the right coronary artery.  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 5. Left ventricular systolic function not assessed . Elevated LVEDP 26 mmHg.    Overall difficult cardiac cath from the right radial approach due to difficulty in locating the left main and RCA. He has a short ascending aorta.  1. CAD:   No angina , doing well.    2. Essential HTN:  BP remains very elevated .    BP remains elevated  Advised him to stay away from salty foods Needs to lose weight.  Needs to stop smoking Will DC toprol and try Coreg 25 mg BID to see if that controlls his BP better.    2. ED:   Takes viagra        Signed, Kristeen MissPhilip Tahira Olivarez, MD  04/10/2016 11:52 AM    [facility] [address], [place], [place]  [address] Phone: [phone]; Fax: [phone]

## 2016-04-20 ENCOUNTER — Other Ambulatory Visit: Payer: Self-pay | Admitting: Family

## 2016-04-22 NOTE — Telephone Encounter (Signed)
Last given #50 no refills 02/09/16, ok to refill?

## 2016-05-21 ENCOUNTER — Other Ambulatory Visit: Payer: Self-pay | Admitting: Family

## 2016-08-10 ENCOUNTER — Other Ambulatory Visit: Payer: Self-pay | Admitting: Family

## 2016-08-10 ENCOUNTER — Other Ambulatory Visit: Payer: Self-pay | Admitting: Cardiovascular Disease

## 2016-09-02 ENCOUNTER — Ambulatory Visit: Payer: Medicaid Other | Admitting: Family Medicine

## 2016-09-03 ENCOUNTER — Encounter: Payer: Self-pay | Admitting: Family

## 2016-09-05 ENCOUNTER — Ambulatory Visit (INDEPENDENT_AMBULATORY_CARE_PROVIDER_SITE_OTHER): Payer: Medicaid Other | Admitting: Family Medicine

## 2016-09-05 ENCOUNTER — Encounter: Payer: Self-pay | Admitting: Family Medicine

## 2016-09-05 VITALS — BP 135/82 | HR 64 | Temp 97.3°F | Ht 67.0 in | Wt 332.6 lb

## 2016-09-05 DIAGNOSIS — E785 Hyperlipidemia, unspecified: Secondary | ICD-10-CM

## 2016-09-05 DIAGNOSIS — N529 Male erectile dysfunction, unspecified: Secondary | ICD-10-CM | POA: Diagnosis not present

## 2016-09-05 DIAGNOSIS — I10 Essential (primary) hypertension: Secondary | ICD-10-CM

## 2016-09-05 NOTE — Patient Instructions (Signed)
Great to see you!  Congratulations on losing weight, keep up the good work!  Come back in 4 months unless you need us sooner.

## 2016-09-05 NOTE — Progress Notes (Signed)
   HPI  Patient presents today here for routine follow-up.  Patient has no complaints except for erectile dysfunction, previously 100 mg of sildenafil was helping well, currently has not set effective. He has tried a vacation with no improvement He has lost weight, he started working as acute Charity fundraiser.  Hyperlipidemia Good medication compliance with no side effects He is fasting today.  Hypertension Medication compliance, complex regimen Not checking blood pressure at home No chest pain, palpitations, Edema.  Patient understands that mixing nitroglycerin plus Viagra is a very dangerous pain, he states that he has not had chest pain in a very long time and understands not to use nitroglycerin if he has used Viagra.  PMH: Smoking status noted ROS: Per HPI  Objective: BP 135/82   Pulse 64   Temp 97.3 F (36.3 C) (Oral)   Ht _0  (1.702 m)   Wt (!) 332 lb 9.6 oz (150.9 kg)   BMI 52.09 kg/m  Gen: NAD, alert, cooperative with exam HEENT: NCAT CV: RRR, good S1/S2, no murmur Resp: CTABL, no wheezes, non-labored Ext: Trace pitting edema bilateral lower extremities Neuro: Alert and oriented, No gross deficits  Assessment and plan:  # Hypertension Well-controlled on current medication regimen, very complex Continue Aldactone, hydralazine, lisinopril, clonidine, Coreg, amlodipine Labs, high risk for Hyper K with aldactone and 80 mg lisinopril  # Hyperlipidemia Tolerating Lipitor well States he's fasting, repeat labs  # Erectile dysfunction Discussed at length the caution around using nitroglycerin plus sildenafil, he understands and agrees not to. Encouraged to continue losing weight, congratulated him on his 20 pound weight loss,  is most likely due to an working full-time now    Orders Placed This Encounter  Procedures  . CMP14+EGFR  . CBC with Differential/Platelet  . Lipid panel     Laroy Apple, MD [facility] 09/05/2016,  4:08 PM

## 2016-09-06 LAB — CMP14+EGFR
ALK PHOS: 63 IU/L (ref 39–117)
ALT: 20 IU/L (ref 0–44)
AST: 16 IU/L (ref 0–40)
Albumin/Globulin Ratio: 1.5 (ref 1.2–2.2)
Albumin: 4.4 g/dL (ref 3.5–5.5)
BILIRUBIN TOTAL: 0.3 mg/dL (ref 0.0–1.2)
BUN / CREAT RATIO: 11 (ref 9–20)
BUN: 13 mg/dL (ref 6–24)
CHLORIDE: 102 mmol/L (ref 96–106)
CO2: 22 mmol/L (ref 18–29)
Calcium: 9.5 mg/dL (ref 8.7–10.2)
Creatinine, Ser: 1.15 mg/dL (ref 0.76–1.27)
GFR calc Af Amer: 85 mL/min/{1.73_m2} (ref >59)
GFR calc non Af Amer: 74 mL/min/{1.73_m2} (ref >59)
GLUCOSE: 87 mg/dL (ref 65–99)
Globulin, Total: 3 g/dL (ref 1.5–4.5)
Potassium: 3.9 mmol/L (ref 3.5–5.2)
Sodium: 141 mmol/L (ref 134–144)
Total Protein: 7.4 g/dL (ref 6.0–8.5)

## 2016-09-06 LAB — CBC WITH DIFFERENTIAL/PLATELET
BASOS ABS: 0 10*3/uL (ref 0.0–0.2)
Basos: 1 %
EOS (ABSOLUTE): 0.1 10*3/uL (ref 0.0–0.4)
Eos: 1 %
HEMOGLOBIN: 15.2 g/dL (ref 13.0–17.7)
Hematocrit: 44.2 % (ref 37.5–51.0)
Immature Grans (Abs): 0 10*3/uL (ref 0.0–0.1)
Immature Granulocytes: 0 %
LYMPHS ABS: 2.1 10*3/uL (ref 0.7–3.1)
Lymphs: 25 %
MCH: 31.3 pg (ref 26.6–33.0)
MCHC: 34.4 g/dL (ref 31.5–35.7)
MCV: 91 fL (ref 79–97)
MONOCYTES: 8 %
Monocytes Absolute: 0.7 10*3/uL (ref 0.1–0.9)
Neutrophils Absolute: 5.6 10*3/uL (ref 1.4–7.0)
Neutrophils: 65 %
PLATELETS: 275 10*3/uL (ref 150–379)
RBC: 4.86 x10E6/uL (ref 4.14–5.80)
RDW: 13.8 % (ref 12.3–15.4)
WBC: 8.4 10*3/uL (ref 3.4–10.8)

## 2016-09-06 LAB — LIPID PANEL
CHOLESTEROL TOTAL: 178 mg/dL (ref 100–199)
Chol/HDL Ratio: 4.7 ratio units (ref 0.0–5.0)
HDL: 38 mg/dL — ABNORMAL LOW (ref >39)
LDL CALC: 122 mg/dL — AB (ref 0–99)
TRIGLYCERIDES: 91 mg/dL (ref 0–149)
VLDL Cholesterol Cal: 18 mg/dL (ref 5–40)

## 2016-09-06 MED ORDER — EZETIMIBE 10 MG PO TABS: 10.0000 mg | ORAL_TABLET | Freq: Every day | ORAL | 0 refills | Status: DC

## 2016-09-06 NOTE — Addendum Note (Signed)
Addended by: Almeta MonasSTONE, JANIE M on: 09/06/2016 11:18 AM   Modules accepted: Orders

## 2016-09-18 ENCOUNTER — Other Ambulatory Visit: Payer: Self-pay | Admitting: Family

## 2016-10-10 ENCOUNTER — Ambulatory Visit: Payer: Medicaid Other | Admitting: Cardiovascular Disease

## 2016-11-08 ENCOUNTER — Other Ambulatory Visit: Payer: Self-pay | Admitting: Family

## 2016-11-13 ENCOUNTER — Other Ambulatory Visit: Payer: Self-pay | Admitting: Family

## 2016-11-14 NOTE — Telephone Encounter (Signed)
Not seen for a regular visit in over a year - hawks pt

## 2016-12-04 ENCOUNTER — Ambulatory Visit: Payer: Medicaid Other | Admitting: Cardiovascular Disease

## 2017-01-06 ENCOUNTER — Ambulatory Visit: Payer: Medicaid Other | Admitting: Physician Assistant

## 2017-01-21 ENCOUNTER — Ambulatory Visit: Payer: Medicaid Other | Admitting: Physician Assistant

## 2017-02-11 ENCOUNTER — Ambulatory Visit: Payer: Medicaid Other | Admitting: Physician Assistant

## 2017-02-11 ENCOUNTER — Other Ambulatory Visit: Payer: Self-pay | Admitting: Family

## 2017-02-13 ENCOUNTER — Encounter: Payer: Self-pay | Admitting: Physician Assistant

## 2017-04-07 ENCOUNTER — Other Ambulatory Visit: Payer: Self-pay | Admitting: Family

## 2017-04-09 NOTE — Telephone Encounter (Signed)
Last seen 09/05/16  Chris Powell 

## 2017-05-05 ENCOUNTER — Telehealth: Payer: Self-pay | Admitting: Family

## 2017-05-05 DIAGNOSIS — G894 Chronic pain syndrome: Secondary | ICD-10-CM

## 2017-05-05 MED ORDER — TIOTROPIUM BROMIDE MONOHYDRATE 18 MCG IN CAPS: 18.0000 ug | ORAL_CAPSULE | Freq: Every day | RESPIRATORY_TRACT | 6 refills | Status: DC

## 2017-05-05 NOTE — Telephone Encounter (Signed)
Spiriva Prescription sent to pharmacy

## 2017-05-05 NOTE — Telephone Encounter (Signed)
Referral to Pain Clinic ordered 

## 2017-05-05 NOTE — Telephone Encounter (Signed)
Please advise 

## 2017-05-13 ENCOUNTER — Other Ambulatory Visit: Payer: Self-pay | Admitting: Family

## 2017-05-15 ENCOUNTER — Telehealth: Payer: Self-pay | Admitting: Family

## 2017-05-15 NOTE — Telephone Encounter (Signed)
Spoke to pt and advised we faxed everything over to Dr Tollie Eth as requested and are just waiting for them to schedule. Pt wanted there phone number so pt given phone number that I found online [phone].

## 2017-05-20 ENCOUNTER — Other Ambulatory Visit: Payer: Self-pay | Admitting: Family

## 2017-06-27 ENCOUNTER — Other Ambulatory Visit: Payer: Self-pay | Admitting: Family

## 2017-06-27 NOTE — Telephone Encounter (Signed)
Last seen 12/17  Dr Kathlene November PCP

## 2017-08-18 ENCOUNTER — Other Ambulatory Visit: Payer: Self-pay | Admitting: Family

## 2017-09-11 ENCOUNTER — Ambulatory Visit (INDEPENDENT_AMBULATORY_CARE_PROVIDER_SITE_OTHER): Payer: Medicaid Other | Admitting: Pediatrics

## 2017-09-11 ENCOUNTER — Encounter: Payer: Self-pay | Admitting: Pediatrics

## 2017-09-11 VITALS — BP 139/89 | HR 93 | Temp 98.1°F | Ht 67.0 in | Wt 334.0 lb

## 2017-09-11 DIAGNOSIS — Z113 Encounter for screening for infections with a predominantly sexual mode of transmission: Secondary | ICD-10-CM

## 2017-09-11 DIAGNOSIS — Z202 Contact with and (suspected) exposure to infections with a predominantly sexual mode of transmission: Secondary | ICD-10-CM

## 2017-09-11 DIAGNOSIS — I10 Essential (primary) hypertension: Secondary | ICD-10-CM | POA: Diagnosis not present

## 2017-09-11 LAB — MICROSCOPIC EXAMINATION
Bacteria, UA: NONE SEEN
RBC, UA: NONE SEEN /HPF (ref 0–?)
WBC, UA: NONE SEEN /HPF (ref 0–?)

## 2017-09-11 LAB — URINALYSIS, COMPLETE
Bilirubin, UA: NEGATIVE
Glucose, UA: NEGATIVE
LEUKOCYTES UA: NEGATIVE
Nitrite, UA: NEGATIVE
PH UA: 5.5 (ref 5.0–7.5)
RBC, UA: NEGATIVE
Specific Gravity, UA: >1.03 — ABNORMAL HIGH (ref 1.005–1.030)
Urobilinogen, Ur: 2 mg/dL — ABNORMAL HIGH (ref 0.2–1.0)

## 2017-09-11 MED ORDER — METRONIDAZOLE 500 MG PO TABS: 2000.0000 mg | ORAL_TABLET | Freq: Once | ORAL | 0 refills | Status: DC

## 2017-09-11 MED ORDER — METRONIDAZOLE 500 MG PO TABS: 2000.0000 mg | ORAL_TABLET | Freq: Once | ORAL | 0 refills | Status: AC

## 2017-09-11 NOTE — Progress Notes (Signed)
  Subjective:   Patient ID: Chris Powell, male    DOB: 1966/07/07, 51 y.o.   MRN: [id number] CC: Exposure to STD  HPI: Chris Powell is a 51 y.o. male presenting for Exposure to STD  Last follow up here over a year ago  Male partner dx with trich about 1 week ago, she thinks she got it from pt Dating for past month One other sexual partner, male, 4 mo ago, was purchased sex on his birthday Did not use a condom  No dysuria, no penile discharge No genital lesions, no fevers Feeling well but wants to be checked for STIs given above  HTN: No HA, CP, SOB Taking meds regularly Not checking BP at home  Relevant past medical, surgical, family and social history reviewed. Allergies and medications reviewed and updated. Social History   Tobacco Use  Smoking Status Current Every Day Smoker  . Packs/day: 0.50  . Years: 30.00  . Pack years: 15.00  . Types: Cigarettes  Smokeless Tobacco Never Used  Tobacco Comment   " I have  tried vapor cigarettes"   ROS: Per HPI   Objective:    BP 139/89   Pulse 93   Temp 98.1 F (36.7 C) (Oral)   Ht _0  (1.702 m)   Wt (!) 334 lb (151.5 kg)   BMI 52.31 kg/m   Wt Readings from Last 3 Encounters:  09/11/17 (!) 334 lb (151.5 kg)  09/05/16 (!) 332 lb 9.6 oz (150.9 kg)  04/10/16 (!) 348 lb 12.8 oz (158.2 kg)    Gen: NAD, alert, obese, cooperative with exam, NCAT EYES: EOMI, no conjunctival injection, or no icterus CV: NRRR, normal S1/S2, no murmur, distal pulses 2+ b/l Resp: CTABL, no wheezes, normal WOB Abd: +BS, soft, NTND. no guarding or organomegaly Ext: No edema, warm Neuro: Alert and oriented, strength equal b/l UE and LE, coordination grossly normal MSK: normal muscle bulk  Assessment & Plan:  Chris Powell was seen today for exposure to std.  Diagnoses and all orders for this visit:  Routine screening for STI (sexually transmitted infection) Check below, treat for trich given exposure in recent partner -     Urinalysis,  Complete -     HIV antibody -     RPR -     GC/Chlamydia Probe Amp  Essential hypertension Elevated today Recheck labs Needs to rtc for f/u -     TSH -     BMP8+EGFR  Trichomonas exposure Treat with below given exposure No intercourse for 1 week after treatment -     metroNIDAZOLE (FLAGYL) 500 MG tablet; Take 4 tablets (2,000 mg total) by mouth once for 1 dose.  Follow up plan: Return in about 2 weeks (around 09/25/2017) for follow up medical problems. Assunta Found, MD [place]

## 2017-09-12 LAB — BMP8+EGFR
BUN/Creatinine Ratio: 11 (ref 9–20)
BUN: 15 mg/dL (ref 6–24)
CHLORIDE: 106 mmol/L (ref 96–106)
CO2: 24 mmol/L (ref 20–29)
Calcium: 9.6 mg/dL (ref 8.7–10.2)
Creatinine, Ser: 1.34 mg/dL — ABNORMAL HIGH (ref 0.76–1.27)
GFR calc Af Amer: 70 mL/min/{1.73_m2} (ref >59)
GFR, EST NON AFRICAN AMERICAN: 61 mL/min/{1.73_m2} (ref >59)
GLUCOSE: 112 mg/dL — AB (ref 65–99)
POTASSIUM: 3.5 mmol/L (ref 3.5–5.2)
SODIUM: 146 mmol/L — AB (ref 134–144)

## 2017-09-12 LAB — HIV ANTIBODY (ROUTINE TESTING W REFLEX): HIV Screen 4th Generation wRfx: NONREACTIVE

## 2017-09-12 LAB — RPR: RPR Ser Ql: NONREACTIVE

## 2017-09-12 LAB — TSH: TSH: 0.904 u[IU]/mL (ref 0.450–4.500)

## 2017-09-13 LAB — GC/CHLAMYDIA PROBE AMP
CHLAMYDIA, DNA PROBE: NEGATIVE
Neisseria gonorrhoeae by PCR: NEGATIVE

## 2017-09-15 ENCOUNTER — Other Ambulatory Visit: Payer: Self-pay | Admitting: Family

## 2017-09-17 NOTE — Telephone Encounter (Signed)
Last seen 09/11/17  Dr Oswaldo DoneVincent

## 2017-10-03 ENCOUNTER — Ambulatory Visit: Payer: Medicaid Other | Admitting: Family Medicine

## 2017-10-07 ENCOUNTER — Ambulatory Visit: Payer: Medicaid Other | Admitting: Family Medicine

## 2017-10-08 ENCOUNTER — Encounter: Payer: Self-pay | Admitting: Family Medicine

## 2017-10-13 ENCOUNTER — Ambulatory Visit: Payer: Medicaid Other | Admitting: Family Medicine

## 2017-10-16 ENCOUNTER — Ambulatory Visit: Payer: Medicaid Other | Admitting: Family Medicine

## 2017-10-27 ENCOUNTER — Ambulatory Visit: Payer: Medicaid Other | Admitting: Family Medicine

## 2017-10-28 ENCOUNTER — Encounter: Payer: Self-pay | Admitting: Family Medicine

## 2017-10-31 ENCOUNTER — Encounter: Payer: Self-pay | Admitting: Family Medicine

## 2017-10-31 ENCOUNTER — Ambulatory Visit: Payer: Medicaid Other | Admitting: Family Medicine

## 2017-10-31 VITALS — BP 185/90 | HR 89 | Temp 98.4°F | Ht 67.0 in | Wt 331.2 lb

## 2017-10-31 DIAGNOSIS — M25511 Pain in right shoulder: Secondary | ICD-10-CM | POA: Diagnosis not present

## 2017-10-31 DIAGNOSIS — R809 Proteinuria, unspecified: Secondary | ICD-10-CM | POA: Diagnosis not present

## 2017-10-31 DIAGNOSIS — I10 Essential (primary) hypertension: Secondary | ICD-10-CM | POA: Diagnosis not present

## 2017-10-31 DIAGNOSIS — F1410 Cocaine abuse, uncomplicated: Secondary | ICD-10-CM

## 2017-10-31 DIAGNOSIS — G894 Chronic pain syndrome: Secondary | ICD-10-CM

## 2017-10-31 DIAGNOSIS — G8929 Other chronic pain: Secondary | ICD-10-CM

## 2017-10-31 LAB — BAYER DCA HB A1C WAIVED: HB A1C (BAYER DCA - WAIVED): 5.3 %

## 2017-10-31 NOTE — Patient Instructions (Signed)
Great to see you!  We will work a referral to pain management and orthopedics.  We will call you or send your results within 1 week.

## 2017-10-31 NOTE — Progress Notes (Signed)
   HPI  Patient presents today here to follow-up for proteinuria.  Patient states that he has had a very difficult time after hearing that his kidneys were doing well.  He states that he started on cocaine again.  He states that he stopped using oxycodone months ago but has recently been pulled over when a pill of oxycodone was discovered in an old nitroglycerin pill bottle.  He requests narcotics today for his continued pain. He requests referral to orthopedics for right shoulder rotator cuff tear.  He states that multiple people in his family have kidney disease, his brother has just finished getting a transplant.  He states that it is a rare disease.  He did not take his blood pressure medications today  PMH: Smoking status noted ROS: Per HPI  Objective: BP (!) 185/90   Pulse 89   Temp 98.4 F (36.9 C) (Oral)   Ht '5\' 7"'$  (1.702 m)   Wt (!) 331 lb 3.2 oz (150.2 kg)   BMI 51.87 kg/m  Gen: NAD, alert, cooperative with exam HEENT: NCAT CV: RRR, good S1/S2, no murmur Resp: CTABL, no wheezes, non-labored Abd: SNTND, BS present, no guarding or organomegaly Ext: No edema, warm Neuro: Alert and oriented, No gross deficits  Assessment and plan:  #Proteinuria With preserved GFR, patient with  recent increase in creatinine as well. Patient with multiple family members with renal disease. Checking protein creatinine ratio and repeat creatinine Discussed multiple reasons for possible decline in renal function for him including uncontrolled hypertension, possible diabetes) prediabetes 3 years ago, checking today), and very high NSAID use Recommended discontinue NSAID use  #Rotator cuff tear, right shoulder pain Patient with history of rotator cuff tear Would like to see orthopedics again Referral placed  #Pain syndrome, cocaine abuse Patient admits to using cocaine recently today, I clearly explained that if he does not meet requirements for treatment with narcotics from primary  care. Pain management referral placed Recommended discontinue NSAIDs.  #Hypertension Noncompliance likely the issue today. Multiple medications, controlled last visit No changes  Letter written today stating that he was prescribed oxycodone last on April 30 according to the [place] controlled substance database.    Orders Placed This Encounter  Procedures  . CMP14+EGFR  . CBC with Differential/Platelet  . Protein / Creatinine Ratio, Urine  . Bayer DCA Hb A1c Waived  . Ambulatory referral to Orthopedic Surgery    Referral Priority:   Routine    Referral Type:   Surgical    Referral Reason:   Specialty Services Required    Requested Specialty:   Orthopedic Surgery    Number of Visits Requested:   1  . Ambulatory referral to Pain Clinic    Referral Priority:   Routine    Referral Type:   Consultation    Referral Reason:   Specialty Services Required    Requested Specialty:   Pain Medicine    Number of Visits Requested:   [place], MD [place] 10/31/2017, 4:14 PM   '

## 2017-11-01 LAB — CBC WITH DIFFERENTIAL/PLATELET
BASOS: 0 %
Basophils Absolute: 0 10*3/uL (ref 0.0–0.2)
EOS (ABSOLUTE): 0.2 10*3/uL (ref 0.0–0.4)
EOS: 3 %
HEMATOCRIT: 45.8 % (ref 37.5–51.0)
Hemoglobin: 15.7 g/dL (ref 13.0–17.7)
Immature Grans (Abs): 0 10*3/uL (ref 0.0–0.1)
Immature Granulocytes: 0 %
LYMPHS ABS: 2.2 10*3/uL (ref 0.7–3.1)
Lymphs: 26 %
MCH: 31.6 pg (ref 26.6–33.0)
MCHC: 34.3 g/dL (ref 31.5–35.7)
MCV: 92 fL (ref 79–97)
MONOS ABS: 0.5 10*3/uL (ref 0.1–0.9)
Monocytes: 6 %
Neutrophils Absolute: 5.4 10*3/uL (ref 1.4–7.0)
Neutrophils: 65 %
Platelets: 263 10*3/uL (ref 150–379)
RBC: 4.97 x10E6/uL (ref 4.14–5.80)
RDW: 14.4 % (ref 12.3–15.4)
WBC: 8.4 10*3/uL (ref 3.4–10.8)

## 2017-11-01 LAB — CMP14+EGFR
ALBUMIN: 3.9 g/dL (ref 3.5–5.5)
ALT: 14 IU/L (ref 0–44)
AST: 11 IU/L (ref 0–40)
Albumin/Globulin Ratio: 1.2 (ref 1.2–2.2)
Alkaline Phosphatase: 110 IU/L (ref 39–117)
BUN / CREAT RATIO: 7 — AB (ref 9–20)
BUN: 8 mg/dL (ref 6–24)
Bilirubin Total: <0.2 mg/dL (ref 0.0–1.2)
CALCIUM: 9.4 mg/dL (ref 8.7–10.2)
CO2: 20 mmol/L (ref 20–29)
CREATININE: 1.19 mg/dL (ref 0.76–1.27)
Chloride: 105 mmol/L (ref 96–106)
GFR calc Af Amer: 81 mL/min/{1.73_m2} (ref >59)
GFR, EST NON AFRICAN AMERICAN: 70 mL/min/{1.73_m2} (ref >59)
GLOBULIN, TOTAL: 3.2 g/dL (ref 1.5–4.5)
Glucose: 133 mg/dL — ABNORMAL HIGH (ref 65–99)
Potassium: 3.8 mmol/L (ref 3.5–5.2)
SODIUM: 144 mmol/L (ref 134–144)
Total Protein: 7.1 g/dL (ref 6.0–8.5)

## 2017-11-01 LAB — PROTEIN / CREATININE RATIO, URINE
Creatinine, Urine: 342.1 mg/dL
PROTEIN/CREAT RATIO: 377 mg/g{creat} — AB (ref 0–200)
Protein, Ur: 129 mg/dL

## 2017-11-03 ENCOUNTER — Telehealth: Payer: Self-pay | Admitting: Family Medicine

## 2017-11-03 ENCOUNTER — Other Ambulatory Visit: Payer: Self-pay | Admitting: *Deleted

## 2017-11-03 ENCOUNTER — Other Ambulatory Visit: Payer: Self-pay

## 2017-11-03 DIAGNOSIS — E8809 Other disorders of plasma-protein metabolism, not elsewhere classified: Secondary | ICD-10-CM

## 2017-11-03 DIAGNOSIS — R779 Abnormality of plasma protein, unspecified: Secondary | ICD-10-CM

## 2017-11-03 NOTE — Progress Notes (Unsigned)
nephnh

## 2017-11-03 NOTE — Telephone Encounter (Signed)
Aware of lab results and referral is in process.

## 2017-11-14 ENCOUNTER — Telehealth: Payer: Self-pay | Admitting: *Deleted

## 2017-11-14 NOTE — Telephone Encounter (Signed)
   [facility] Pre-operative Risk Assessment    Request for surgical clearance:  1. What type of surgery is being performed? RIGHT SHOULDER SCOPE AND ROTATOR CUFF REPAIR   2. When is this surgery scheduled? TBD    3. Are there any medications that need to be held prior to surgery and how long? PT IS ON EFFIENT AND ASA  4. Practice name and name of physician performing surgery? [facility]; DR. Vonna Kotyk LANDAU  5. What is your office phone and fax number? PH# [phone] EXT [phone]; FAX# [phone]   2. Anesthesia type (None, local, MAC, general) ? LEFT MESSAGE FOR SHERRI AT [facility] TO CALL BACK WITH TYPE OF ANESTHESIA BEING USED.    Julaine Hua 11/14/2017, 11:08 AM  _________________________________________________________________   (provider comments below)

## 2017-11-14 NOTE — Telephone Encounter (Signed)
ANESTHESIA IS CHOICE PER [facility] AT [facility]

## 2017-11-14 NOTE — Telephone Encounter (Signed)
Left message for patient to call back to schedule a pre-op appointment

## 2017-11-14 NOTE — Telephone Encounter (Signed)
   Primary Cardiologist:Philip Nahser, MD  Chart reviewed as part of pre-operative protocol coverage. Because of Alan RipperMaurice D Traum's past medical history and time since last visit, he/she will require a follow-up visit in order to better assess preoperative cardiovascular risk.  Pre-op covering staff: - Please schedule appointment and call patient to inform them. - Please contact requesting surgeon's office via preferred method (i.e, phone, fax) to inform them of need for appointment prior to surgery.  Robbie LisBrittainy Simmons, PA-C  11/14/2017, 2:49 PM

## 2017-11-20 ENCOUNTER — Ambulatory Visit (INDEPENDENT_AMBULATORY_CARE_PROVIDER_SITE_OTHER): Payer: Medicaid Other | Admitting: Family

## 2017-11-20 ENCOUNTER — Encounter: Payer: Self-pay | Admitting: Family

## 2017-11-20 ENCOUNTER — Ambulatory Visit (INDEPENDENT_AMBULATORY_CARE_PROVIDER_SITE_OTHER): Payer: Medicaid Other

## 2017-11-20 VITALS — BP 167/94 | HR 80 | Temp 98.3°F | Ht 67.0 in | Wt 335.4 lb

## 2017-11-20 DIAGNOSIS — I10 Essential (primary) hypertension: Secondary | ICD-10-CM | POA: Diagnosis not present

## 2017-11-20 DIAGNOSIS — I252 Old myocardial infarction: Secondary | ICD-10-CM | POA: Diagnosis not present

## 2017-11-20 DIAGNOSIS — Z01818 Encounter for other preprocedural examination: Secondary | ICD-10-CM

## 2017-11-20 DIAGNOSIS — I2510 Atherosclerotic heart disease of native coronary artery without angina pectoris: Secondary | ICD-10-CM | POA: Diagnosis not present

## 2017-11-20 DIAGNOSIS — G894 Chronic pain syndrome: Secondary | ICD-10-CM

## 2017-11-20 DIAGNOSIS — Z9861 Coronary angioplasty status: Secondary | ICD-10-CM | POA: Diagnosis not present

## 2017-11-20 DIAGNOSIS — I249 Acute ischemic heart disease, unspecified: Secondary | ICD-10-CM

## 2017-11-20 NOTE — Progress Notes (Signed)
   Subjective:    Patient ID: Chris Powell, male    DOB: 01/23/1966, 51 y.o.   MRN: [id number]  Pt presents to the office today for preoperative clearance for rotator cuff repair. He is followed by Cardiologists for HX NSTEMI, CAD,  and has appointment with them prior to surgery on 12/03/17.  PT has not taken his Coreg, Clonidine, hydralazine, and lisinopril today.  Hypertension  This is a chronic problem. The current episode started more than 1 year ago. The problem has been waxing and waning since onset. The problem is uncontrolled. Associated symptoms include headaches and malaise/fatigue. Pertinent negatives include no peripheral edema or shortness of breath. Risk factors for coronary artery disease include dyslipidemia, obesity, male gender and family history. The current treatment provides no improvement. Hypertensive end-organ damage includes kidney disease and CAD/MI.        Review of Systems  Constitutional: Positive for malaise/fatigue.  Respiratory: Negative for shortness of breath.   Neurological: Positive for headaches.  All other systems reviewed and are negative.      Objective:   Physical Exam  Constitutional: He is oriented to person, place, and time. He appears well-developed and well-nourished. No distress.  Morbid obese  HENT:  Head: Normocephalic.  Right Ear: External ear normal.  Left Ear: External ear normal.  Nose: Nose normal.  Mouth/Throat: Oropharynx is clear and moist.  Eyes: Pupils are equal, round, and reactive to light. Right eye exhibits no discharge. Left eye exhibits no discharge.  Neck: Normal range of motion. Neck supple. No thyromegaly present.  Cardiovascular: Normal rate, normal heart sounds and intact distal pulses. An irregular rhythm present.  No murmur heard. Pulmonary/Chest: Effort normal and breath sounds normal. No respiratory distress. He has no wheezes.  Abdominal: Soft. Bowel sounds are normal. He exhibits no distension. There  is no tenderness.  Musculoskeletal: Normal range of motion. He exhibits no edema or tenderness.  Neurological: He is alert and oriented to person, place, and time.  Skin: Skin is warm and dry. No rash noted. No erythema.  Psychiatric: He has a normal mood and affect. His behavior is normal. Judgment and thought content normal.  Vitals reviewed.     Chest x-ray- Negative, Preliminary reading by Evelina Dun, FNP [facility]  BP (!) 167/94   Pulse 80   Temp 98.3 F (36.8 C) (Oral)   Ht _0  (1.702 m)   Wt (!) 335 lb 6.4 oz (152.1 kg)   BMI 52.53 kg/m      Assessment & Plan:  1. Pre-op exam - DG Chest 2 View; Future - EKG 12-Lead - CMP14+EGFR - CBC with Differential/Platelet  2. Essential hypertension Discussed importance of taking ALL BP meds everyday!! -Dash diet information given -Exercise encouraged - Stress Management  -Continue current meds   3. Acute coronary syndrome (HCC)  4. CAD - S/P LAD DES 3/11/22/14  5. Hx of non-ST elevation myocardial infarction (NSTEMI)  6. Morbid obesity-BMI 51  7. Chronic pain syndrome   Labs pending Discussed he needs to take all of his medications prior Cardiologists appt and if stable at that time we sign off form, if labs are normal  Evelina Dun, FNP

## 2017-11-20 NOTE — Patient Instructions (Signed)
Surgery for Rotator Cuff Tear, Care After Refer to this sheet in the next few weeks. These instructions provide you with information about caring for yourself after your procedure. Your health care provider may also give you more specific instructions. Your treatment has been planned according to current medical practices, but problems sometimes occur. Call your health care provider if you have any problems or questions after your procedure. What can I expect after the procedure? After the procedure, it is common to have:  Swelling.  Pain.  Stiffness.  Tenderness.  Follow these instructions at home: If you have a sling:  Wear the sling as told by your health care provider. Remove it only as told by your health care provider.  Loosen the sling if your fingers tingle, become numb, or turn cold and blue.  Do not let your sling get wet if it is not waterproof.  Keep the sling clean. Bathing  Do not take baths, swim, or use a hot tub until your health care provider approves. Ask your health care provider if you can take showers. You may only be allowed to take sponge baths for bathing.  Keep your bandage (dressing) dry until your health care provider says it can be removed. Incision care  Follow instructions from your health care provider about how to take care of your incision. Make sure you: ? Wash your hands with soap and water before you change your dressing. If soap and water are not available, use hand sanitizer. ? Change your dressing as told by your health care provider. ? Leave stitches (sutures), skin glue, or adhesive strips in place. These skin closures may need to stay in place for 2 weeks or longer. If adhesive strip edges start to loosen and curl up, you may trim the loose edges. Do not remove adhesive strips completely unless your health care provider tells you to do that.  Check your incision area every day for signs of infection. Check for: ? More redness, swelling,  or pain. ? More fluid or blood. ? Warmth. ? Pus or a bad smell. Managing pain, stiffness, and swelling   If directed, put ice on your shoulder area. ? Put ice in a plastic bag. ? Place a towel between your skin and the bag. ? Leave the ice on for 20 minutes, 2-3 times a day.  Move your fingers often to avoid stiffness and to lessen swelling.  Raise (elevate) your upper body on pillows when you lie down and when you sleep. ? Do not sleep on the front of your body (abdomen). ? Do not sleep on the side that your surgery was performed on. Driving  Do not drive for 24 hours if you received a medicine to help you relax (sedative) during your procedure.  Do not drive or operate heavy machinery while taking prescription pain medicine.  Ask your health care provider when it is safe for you to drive. Activity  Do not use your arm to support your body weight until your health care provider approves.  Do not lift or hold anything with your arm until your health care provider approves.  Return to your normal activities as told by your health care provider. Ask your health care provider what activities are safe for you.  Do exercises as told by your health care provider. General instructions   Do not use any tobacco products, such as cigarettes, chewing tobacco, or e-cigarettes. Tobacco can delay healing. If you need help quitting, ask your health care provider.    Take over-the-counter and prescription medicines only as told by your health care provider.  If you were prescribed an antibiotic medicine, take it as told by your health care provider. Do not stop taking the antibiotic even if you start to feel better.  Keep all follow-up visits as told by your health care provider. This is important. Contact a health care provider if:  You have a fever.  You have more redness, swelling, or pain around your incision.  You have more fluid or blood coming from your incision.  Your  incision feels warm to the touch.  You have pus or a bad smell coming from your incision.  You have pain that gets worse or does not get better with medicine. Get help right away if:  You have severe pain.  You lose feeling in your arm or hand.  Your hand or fingers turn very pale or blue. This information is not intended to replace advice given to you by your health care provider. Make sure you discuss any questions you have with your health care provider. Document Released: 09/09/2005 Document Revised: 05/15/2016 Document Reviewed: 09/23/2015 Elsevier Interactive Patient Education  2018 [employer]  

## 2017-11-21 LAB — CMP14+EGFR
A/G RATIO: 1.3 (ref 1.2–2.2)
ALT: 20 IU/L (ref 0–44)
AST: 13 IU/L (ref 0–40)
Albumin: 3.9 g/dL (ref 3.5–5.5)
Alkaline Phosphatase: 87 IU/L (ref 39–117)
BUN/Creatinine Ratio: 10 (ref 9–20)
BUN: 10 mg/dL (ref 6–24)
Bilirubin Total: 0.3 mg/dL (ref 0.0–1.2)
CALCIUM: 9.3 mg/dL (ref 8.7–10.2)
CO2: 22 mmol/L (ref 20–29)
CREATININE: 1.01 mg/dL (ref 0.76–1.27)
Chloride: 105 mmol/L (ref 96–106)
GFR calc Af Amer: 99 mL/min/{1.73_m2} (ref >59)
GFR, EST NON AFRICAN AMERICAN: 86 mL/min/{1.73_m2} (ref >59)
GLUCOSE: 114 mg/dL — AB (ref 65–99)
Globulin, Total: 2.9 g/dL (ref 1.5–4.5)
POTASSIUM: 3.8 mmol/L (ref 3.5–5.2)
Sodium: 145 mmol/L — ABNORMAL HIGH (ref 134–144)
TOTAL PROTEIN: 6.8 g/dL (ref 6.0–8.5)

## 2017-11-21 LAB — CBC WITH DIFFERENTIAL/PLATELET
BASOS: 1 %
Basophils Absolute: 0.1 10*3/uL (ref 0.0–0.2)
EOS (ABSOLUTE): 0.1 10*3/uL (ref 0.0–0.4)
EOS: 1 %
HEMOGLOBIN: 15.5 g/dL (ref 13.0–17.7)
Hematocrit: 45.8 % (ref 37.5–51.0)
IMMATURE GRANS (ABS): 0 10*3/uL (ref 0.0–0.1)
IMMATURE GRANULOCYTES: 0 %
LYMPHS: 26 %
Lymphocytes Absolute: 1.9 10*3/uL (ref 0.7–3.1)
MCH: 30.7 pg (ref 26.6–33.0)
MCHC: 33.8 g/dL (ref 31.5–35.7)
MCV: 91 fL (ref 79–97)
MONOCYTES: 9 %
Monocytes Absolute: 0.6 10*3/uL (ref 0.1–0.9)
NEUTROS ABS: 4.7 10*3/uL (ref 1.4–7.0)
NEUTROS PCT: 63 %
PLATELETS: 249 10*3/uL (ref 150–379)
RBC: 5.05 x10E6/uL (ref 4.14–5.80)
RDW: 14.2 % (ref 12.3–15.4)
WBC: 7.4 10*3/uL (ref 3.4–10.8)

## 2017-11-25 ENCOUNTER — Other Ambulatory Visit: Payer: Self-pay | Admitting: *Deleted

## 2017-11-25 DIAGNOSIS — R7309 Other abnormal glucose: Secondary | ICD-10-CM

## 2017-11-26 ENCOUNTER — Encounter: Payer: Self-pay | Admitting: Physician Assistant

## 2017-11-27 ENCOUNTER — Other Ambulatory Visit: Payer: Self-pay | Admitting: Family

## 2017-11-27 DIAGNOSIS — E119 Type 2 diabetes mellitus without complications: Secondary | ICD-10-CM | POA: Insufficient documentation

## 2017-11-27 DIAGNOSIS — E1129 Type 2 diabetes mellitus with other diabetic kidney complication: Secondary | ICD-10-CM | POA: Insufficient documentation

## 2017-11-27 LAB — HGB A1C W/O EAG: Hgb A1c MFr Bld: 6 % — ABNORMAL HIGH (ref 4.8–5.6)

## 2017-11-27 LAB — SPECIMEN STATUS REPORT

## 2017-11-27 MED ORDER — METFORMIN HCL ER 500 MG PO TB24: 500.0000 mg | ORAL_TABLET | Freq: Every day | ORAL | 1 refills | Status: DC

## 2017-12-01 ENCOUNTER — Telehealth: Payer: Self-pay | Admitting: Family Medicine

## 2017-12-01 MED ORDER — SPIRONOLACTONE 50 MG PO TABS: 50.0000 mg | ORAL_TABLET | Freq: Every day | ORAL | 3 refills | Status: DC

## 2017-12-01 NOTE — Telephone Encounter (Signed)
refillled aldactone, increased last year and recently had normal potassium.    Murtis SinkSam Bradshaw, MD [facility] 12/01/2017, 5:04 PM

## 2017-12-01 NOTE — Telephone Encounter (Signed)
What is the name of the medication? Aldactone 50mg    Have you contacted your pharmacy to request a refill? yes  Which pharmacy would you like this sent to? [facility]   Patient notified that their request is being sent to the clinical staff for review and that they should receive a call once it is complete. If they do not receive a call within 24 hours they can check with their pharmacy or our office.

## 2017-12-02 LAB — HM DIABETES EYE EXAM

## 2017-12-02 NOTE — Telephone Encounter (Signed)
Patient aware medication has been sent in

## 2017-12-03 ENCOUNTER — Ambulatory Visit: Payer: Medicaid Other | Admitting: Physician Assistant

## 2017-12-03 ENCOUNTER — Encounter: Payer: Self-pay | Admitting: Physician Assistant

## 2017-12-03 VITALS — BP 108/78 | HR 84 | Ht 67.0 in | Wt 338.2 lb

## 2017-12-03 DIAGNOSIS — I2510 Atherosclerotic heart disease of native coronary artery without angina pectoris: Secondary | ICD-10-CM | POA: Diagnosis not present

## 2017-12-03 DIAGNOSIS — I10 Essential (primary) hypertension: Secondary | ICD-10-CM

## 2017-12-03 DIAGNOSIS — Z9989 Dependence on other enabling machines and devices: Secondary | ICD-10-CM | POA: Diagnosis not present

## 2017-12-03 DIAGNOSIS — E785 Hyperlipidemia, unspecified: Secondary | ICD-10-CM | POA: Diagnosis not present

## 2017-12-03 DIAGNOSIS — G4733 Obstructive sleep apnea (adult) (pediatric): Secondary | ICD-10-CM

## 2017-12-03 DIAGNOSIS — Z720 Tobacco use: Secondary | ICD-10-CM | POA: Diagnosis not present

## 2017-12-03 DIAGNOSIS — Z9861 Coronary angioplasty status: Secondary | ICD-10-CM

## 2017-12-03 NOTE — Progress Notes (Signed)
Cardiology Office Note    Date:  12/03/2017   ID:  Chris Powell, DOB 1966/08/18, MRN [id number]  PCP:  Elenora Gamma, MD  Cardiologist: Dr. Elease Hashimoto  Chief Complaint: Pre-op clearance for  RIGHT SHOULDER SCOPE AND ROTATOR CUFF REPAIR   History of Present Illness:   Chris Powell is a 52 y.o. male CAD, HTN, OSA on CPAP, DM, COPD and ongoing tobacco smoking presented cardiac clearance.   Admitted to the hospital with NSTEMI 12/21/14 treated with drug-eluting stent to the LAD. Echo showed moderate LVH EF 55-65%. He was doing well on cardiac stand point when last seen by Dr. Elease Hashimoto 03/2016.  Here today for follow-up..  Ongoing tobacco smoking, he has cut back to half a pack a day.  Denies chest pain, shortness of breath, lower extremity edema, palpitation, orthopnea, PND, syncope or melena.  Recent blood work by PCP reviewed.  No specific complaints.  Past Medical History:  Diagnosis Date  . Arthritis   . Asthma   . COPD (chronic obstructive pulmonary disease) (HCC)   . Coronary artery disease   . Depression   . Diabetes (HCC)   . GERD (gastroesophageal reflux disease)   . Hyperlipidemia   . Hypertension   . Myocardial infarction (HCC)   . Pneumonia    HX OF PNA  . Shortness of breath dyspnea   . Sleep apnea    USES CPAP    Past Surgical History:  Procedure Laterality Date  . CARDIAC CATHETERIZATION  12/22/2014  . CORONARY STENT PLACEMENT  12/22/2014   LAD  . KNEE SURGERY    . LEFT HEART CATHETERIZATION WITH CORONARY ANGIOGRAM N/A 12/22/2014   Procedure: LEFT HEART CATHETERIZATION WITH CORONARY ANGIOGRAM;  Surgeon: Corky Crafts, MD;  Location: [facility] CATH LAB;  Service: Cardiovascular;  Laterality: N/A;    Current Medications: Prior to Admission medications   Medication Sig Start Date End Date Taking? Authorizing Provider  amLODipine (NORVASC) 10 MG tablet Take 1 tablet (10 mg total) by mouth daily. 09/12/15  Yes Hawks, Christy A, FNP  aspirin EC 81 MG  tablet Take 81 mg by mouth daily.   Yes [provider]  atorvastatin (LIPITOR) 80 MG tablet Take 1 tablet (80 mg total) by mouth daily at 6 PM. 12/23/14  Yes Kilroy, Franky Macho K, PA-C  carvedilol (COREG) 25 MG tablet Take 1 tablet (25 mg total) by mouth 2 (two) times daily. 04/10/16  Yes Nahser, Deloris Ping, MD  Cholecalciferol 2000 UNITS CAPS Take 2,000 Units by mouth daily.    Yes [provider]  cloNIDine (CATAPRES) 0.3 MG tablet Take 0.3 mg by mouth 2 (two) times daily.    Yes [provider]  cyclobenzaprine (FLEXERIL) 10 MG tablet Take 1 tablet (10 mg total) by mouth 3 (three) times daily as needed for muscle spasms. 09/12/15  Yes Hawks, Christy A, FNP  ezetimibe (ZETIA) 10 MG tablet Take 1 tablet (10 mg total) by mouth daily. 09/06/16  Yes Elenora Gamma, MD  fluticasone (FLONASE) 50 MCG/ACT nasal spray Place 2 sprays into both nostrils daily. 03/11/16  Yes Hawks, Christy A, FNP  furosemide (LASIX) 40 MG tablet Take 1 tablet (40 mg total) by mouth daily. 02/28/15  Yes Hawks, Christy A, FNP  gabapentin (NEURONTIN) 300 MG capsule Take 1 capsule (300 mg total) by mouth 3 (three) times daily. 04/01/16  Yes Elenora Gamma, MD  hydrALAZINE (APRESOLINE) 50 MG tablet TAKE 1 TABLET (50 MG TOTAL) BY MOUTH 3 (THREE) TIMES  DAILY. 08/12/16  Yes Nahser, Deloris PingPhilip J, MD  ibuprofen (ADVIL,MOTRIN) 800 MG tablet TAKE 1 TABLET (800 MG TOTAL) BY MOUTH EVERY 8 (EIGHT) HOURS AS NEEDED. 11/08/16  Yes Hecox, Kerri A, MD  lisinopril (PRINIVIL,ZESTRIL) 40 MG tablet Take 1 tablet (40 mg total) by mouth daily. 11/22/15  Yes Nahser, Deloris PingPhilip J, MD  metFORMIN (GLUCOPHAGE XR) 500 MG 24 hr tablet Take 1 tablet (500 mg total) by mouth daily with breakfast. 11/27/17 11/27/18 Yes Hawks, Christy A, FNP  mometasone (NASONEX) 50 MCG/ACT nasal spray Place 2 sprays into the nose daily. Prn for allergies   Yes [provider]  naloxone (NARCAN) nasal spray 4 mg/0.1 mL Place 1 spray into the nose as directed.   Yes  [provider]  Nicotine Polacrilex (RA NICOTINE GUM MT) Use as directed 1 each in the mouth or throat as needed (Smoking Cessation).   Yes [provider]  nitroGLYCERIN (NITROSTAT) 0.4 MG SL tablet Place 1 tablet (0.4 mg total) under the tongue every 5 (five) minutes as needed for chest pain. 01/27/15  Yes Kilroy, Luke K, PA-C  omeprazole (PRILOSEC) 20 MG capsule Take 20 mg by mouth daily.   Yes [provider]  Oxycodone HCl 10 MG TABS Take 10 mg by mouth 3 (three) times daily as needed (as needed for pain).   Yes [provider]  potassium chloride SA (K-DUR,KLOR-CON) 20 MEQ tablet Take 20 mEq by mouth daily.   Yes [provider]  prasugrel (EFFIENT) 10 MG TABS tablet Take 1 tablet (10 mg total) by mouth daily. 08/29/15  Yes Hawks, Christy A, FNP  sildenafil (REVATIO) 20 MG tablet TAKE 2-5 TABLETS AS NEEDED PRIOR TO SEXUAL ACTIVITY 09/17/17  Yes Stacks, Broadus JohnWarren, MD  spironolactone (ALDACTONE) 50 MG tablet Take 1 tablet (50 mg total) by mouth daily. 12/01/17  Yes Elenora GammaBradshaw, Samuel L, MD  tiotropium (SPIRIVA) 18 MCG inhalation capsule Place 1 capsule (18 mcg total) into inhaler and inhale daily. 05/05/17  Yes Hawks, Christy A, FNP  traMADol (ULTRAM) 50 MG tablet Take 50 mg by mouth 2 (two) times daily as needed for moderate pain.  11/17/17  Yes [provider]    Allergies:   Patient has no known allergies.   Social History   Socioeconomic History  . Marital status: Legally Separated    Spouse name: None  . Number of children: None  . Years of education: None  . Highest education level: None  Social Needs  . Financial resource strain: None  . Food insecurity - worry: None  . Food insecurity - inability: None  . Transportation needs - medical: None  . Transportation needs - non-medical: None  Occupational History  . None  Tobacco Use  . Smoking status: Current Every Day Smoker    Packs/day: 0.50    Years: 30.00    Pack years: 15.00      Types: Cigarettes  . Smokeless tobacco: Never Used  . Tobacco comment: " I have  tried vapor cigarettes"  Substance and Sexual Activity  . Alcohol use: Yes    Alcohol/week: 0.0 oz    Comment: none now but previous  . Drug use: Yes    Types: Cocaine    Comment: Denies any recent use.  Pt has a Pain Contract. last use was 2016  . Sexual activity: None  Other Topics Concern  . None  Social History Narrative  . None     Family History:  The patient's family history includes Cancer in  his mother and sister; Diabetes in his father; Heart attack in his father; Hyperlipidemia in his mother; Hypertension in his mother.   ROS:   Please see the history of present illness.    ROS All other systems reviewed and are negative.   PHYSICAL EXAM:   VS:  BP 108/78   Pulse 84   Ht 5\' 7"  (1.702 m)   Wt (!) 338 lb 4 oz (153.4 kg)   SpO2 98%   BMI 52.98 kg/m    GEN: Well nourished, well developed, in no acute distress  HEENT: normal  Neck: no JVD, carotid bruits, or masses Cardiac: RRR; no murmurs, rubs, or gallops,no edema  Respiratory:  clear to auscultation bilaterally, normal work of breathing GI: soft, nontender, nondistended, + BS MS: no deformity or atrophy  Skin: warm and dry, no rash Neuro:  Alert and Oriented x 3, Strength and sensation are intact Psych: euthymic mood, full affect  Wt Readings from Last 3 Encounters:  12/03/17 (!) 338 lb 4 oz (153.4 kg)  11/20/17 (!) 335 lb 6.4 oz (152.1 kg)  10/31/17 (!) 331 lb 3.2 oz (150.2 kg)      Studies/Labs Reviewed:   EKG:  EKG is not ordered today.    Recent Labs: 09/11/2017: TSH 0.904 11/20/2017: ALT 20; BUN 10; Creatinine, Ser 1.01; Hemoglobin 15.5; Platelets 249; Potassium 3.8; Sodium 145   Lipid Panel    Component Value Date/Time   CHOL 178 09/05/2016 1606   TRIG 91 09/05/2016 1606   HDL 38 (L) 09/05/2016 1606   CHOLHDL 4.7 09/05/2016 1606   CHOLHDL 3.4 01/25/2015 0015   VLDL 16 01/25/2015 0015   LDLCALC 122 (H)  09/05/2016 1606    Additional studies/ records that were reviewed today include:   2-D echo 12/23/14 Study Conclusions  - Left ventricle: The cavity size was normal. Wall thickness was increased in a pattern of moderate LVH. Systolic function was normal. The estimated ejection fraction was in the range of 55% to 65%. - Left atrium: The atrium was moderately dilated.  Cardiac catheterization 12/22/14 IMPRESSIONS:  1. Widely patent left main coronary artery. 2. Severe disease in the mid left anterior descending artery with moderate disease further distal. The 95% stenosis was treated with a 2.5 x 24 Synergy drug-eluting stent, postdilated with a 3.0 noncompliant balloon. 3. Mild to moderate disease in the left circumflex artery and its branches. 4. Mild to moderate disease in the right coronary artery. 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 5. Left ventricular systolic function not assessed . Elevated LVEDP 26 mmHg.   Overall difficult cardiac cath from the right radial approach due to difficulty in locating the left main and RCA. He has a short ascending aorta.   ASSESSMENT & PLAN:    1. CAD s/p DES to mLAD -No angina.  Continue aspirin, Coreg and statin.  2. HTN -Stable and well controlled on current medication.  3. OSA on CPAP - Compliant. Managed by [facility].   4. Cardiac clearance - He is able to get at least 7.99 Mets of activity.  Okay to hold aspirin for 7 days prior to surgery and resume post procedure when  Stable. Given past medical history and time since last visit, based on ACC/AHA guidelines, BRADON FESTER would be at acceptable risk for the planned procedure without further cardiovascular testing.   I will route this recommendation to the requesting party via Epic fax function and remove from pre-op pool.  12/03/2017, 10:47 AM  5. HLD - LDL was 122 on 08/2016. Recent LFTS normal 11/20/17. Check LFT next week.     6.  Tobacco smoking -Encourage complete cessation.  He has cut back to half a pack a day.  7.  Morbid obesity -Encourage daily exercise and weight loss.  Medication Adjustments/Labs and Tests Ordered: Current medicines are reviewed at length with the patient today.  Concerns regarding medicines are outlined above.  Medication changes, Labs and Tests ordered today are listed in the Patient Instructions below. Patient Instructions  Medication Instructions:  Your physician recommends that you continue on your current medications as directed. Please refer to the Current Medication list given to you today.   Labwork: Your physician recommends that you return for lab work on Tuesday December 09, 2017 for FASTING LIPIDS. Nothing to eat or drink after midnight the night before these labs are drawn.   Testing/Procedures: None ordered today  Follow-Up: Your physician wants you to follow-up in 6 months with Dr. Elease Hashimoto. You will receive a reminder letter in the mail two months in advance. If you don't receive a letter, please call our office to schedule the follow-up appointment.   Any Other Special Instructions Will Be Listed Below (If Applicable).  If you need a refill on your cardiac medications before your next appointment, please call your pharmacy.      Lorelei Pont, [place]  12/03/2017 10:45 AM    [facility] [address] [place], [place], [place]  [address] Phone: [phone]; Fax: [phone]

## 2017-12-03 NOTE — H&P (View-Only) (Signed)
Cardiology Office Note    Date:  12/03/2017   ID:  [name], DOB 1966/08/18, MRN [id number]  PCP:  Elenora Gamma, MD  Cardiologist: Dr. Elease Hashimoto  Chief Complaint: Pre-op clearance for  RIGHT SHOULDER SCOPE AND ROTATOR CUFF REPAIR   History of Present Illness:   Chris Powell is a 52 y.o. male CAD, HTN, OSA on CPAP, DM, COPD and ongoing tobacco smoking presented cardiac clearance.   Admitted to the hospital with NSTEMI 12/21/14 treated with drug-eluting stent to the LAD. Echo showed moderate LVH EF 55-65%. He was doing well on cardiac stand point when last seen by Dr. Elease Hashimoto 03/2016.  Here today for follow-up..  Ongoing tobacco smoking, he has cut back to half a pack a day.  Denies chest pain, shortness of breath, lower extremity edema, palpitation, orthopnea, PND, syncope or melena.  Recent blood work by PCP reviewed.  No specific complaints.  Past Medical History:  Diagnosis Date  . Arthritis   . Asthma   . COPD (chronic obstructive pulmonary disease) (HCC)   . Coronary artery disease   . Depression   . Diabetes (HCC)   . GERD (gastroesophageal reflux disease)   . Hyperlipidemia   . Hypertension   . Myocardial infarction (HCC)   . Pneumonia    HX OF PNA  . Shortness of breath dyspnea   . Sleep apnea    USES CPAP    Past Surgical History:  Procedure Laterality Date  . CARDIAC CATHETERIZATION  12/22/2014  . CORONARY STENT PLACEMENT  12/22/2014   LAD  . KNEE SURGERY    . LEFT HEART CATHETERIZATION WITH CORONARY ANGIOGRAM N/A 12/22/2014   Procedure: LEFT HEART CATHETERIZATION WITH CORONARY ANGIOGRAM;  Surgeon: Corky Crafts, MD;  Location: [facility] CATH LAB;  Service: Cardiovascular;  Laterality: N/A;    Current Medications: Prior to Admission medications   Medication Sig Start Date End Date Taking? Authorizing Provider  amLODipine (NORVASC) 10 MG tablet Take 1 tablet (10 mg total) by mouth daily. 09/12/15  Yes Hawks, Christy A, FNP  aspirin EC 81 MG  tablet Take 81 mg by mouth daily.   Yes [provider]  atorvastatin (LIPITOR) 80 MG tablet Take 1 tablet (80 mg total) by mouth daily at 6 PM. 12/23/14  Yes Kilroy, Franky Macho K, PA-C  carvedilol (COREG) 25 MG tablet Take 1 tablet (25 mg total) by mouth 2 (two) times daily. 04/10/16  Yes Nahser, Deloris Ping, MD  Cholecalciferol 2000 UNITS CAPS Take 2,000 Units by mouth daily.    Yes [provider]  cloNIDine (CATAPRES) 0.3 MG tablet Take 0.3 mg by mouth 2 (two) times daily.    Yes [provider]  cyclobenzaprine (FLEXERIL) 10 MG tablet Take 1 tablet (10 mg total) by mouth 3 (three) times daily as needed for muscle spasms. 09/12/15  Yes Hawks, Christy A, FNP  ezetimibe (ZETIA) 10 MG tablet Take 1 tablet (10 mg total) by mouth daily. 09/06/16  Yes Elenora Gamma, MD  fluticasone (FLONASE) 50 MCG/ACT nasal spray Place 2 sprays into both nostrils daily. 03/11/16  Yes Hawks, Christy A, FNP  furosemide (LASIX) 40 MG tablet Take 1 tablet (40 mg total) by mouth daily. 02/28/15  Yes Hawks, Christy A, FNP  gabapentin (NEURONTIN) 300 MG capsule Take 1 capsule (300 mg total) by mouth 3 (three) times daily. 04/01/16  Yes Elenora Gamma, MD  hydrALAZINE (APRESOLINE) 50 MG tablet TAKE 1 TABLET (50 MG TOTAL) BY MOUTH 3 (THREE) TIMES  DAILY. 08/12/16  Yes Nahser, Deloris PingPhilip J, MD  ibuprofen (ADVIL,MOTRIN) 800 MG tablet TAKE 1 TABLET (800 MG TOTAL) BY MOUTH EVERY 8 (EIGHT) HOURS AS NEEDED. 11/08/16  Yes Hecox, Kerri A, MD  lisinopril (PRINIVIL,ZESTRIL) 40 MG tablet Take 1 tablet (40 mg total) by mouth daily. 11/22/15  Yes Nahser, Deloris PingPhilip J, MD  metFORMIN (GLUCOPHAGE XR) 500 MG 24 hr tablet Take 1 tablet (500 mg total) by mouth daily with breakfast. 11/27/17 11/27/18 Yes Hawks, Christy A, FNP  mometasone (NASONEX) 50 MCG/ACT nasal spray Place 2 sprays into the nose daily. Prn for allergies   Yes [provider]  naloxone (NARCAN) nasal spray 4 mg/0.1 mL Place 1 spray into the nose as directed.   Yes  [provider]  Nicotine Polacrilex (RA NICOTINE GUM MT) Use as directed 1 each in the mouth or throat as needed (Smoking Cessation).   Yes [provider]  nitroGLYCERIN (NITROSTAT) 0.4 MG SL tablet Place 1 tablet (0.4 mg total) under the tongue every 5 (five) minutes as needed for chest pain. 01/27/15  Yes Kilroy, Luke K, PA-C  omeprazole (PRILOSEC) 20 MG capsule Take 20 mg by mouth daily.   Yes [provider]  Oxycodone HCl 10 MG TABS Take 10 mg by mouth 3 (three) times daily as needed (as needed for pain).   Yes [provider]  potassium chloride SA (K-DUR,KLOR-CON) 20 MEQ tablet Take 20 mEq by mouth daily.   Yes [provider]  prasugrel (EFFIENT) 10 MG TABS tablet Take 1 tablet (10 mg total) by mouth daily. 08/29/15  Yes Hawks, Christy A, FNP  sildenafil (REVATIO) 20 MG tablet TAKE 2-5 TABLETS AS NEEDED PRIOR TO SEXUAL ACTIVITY 09/17/17  Yes Stacks, Broadus JohnWarren, MD  spironolactone (ALDACTONE) 50 MG tablet Take 1 tablet (50 mg total) by mouth daily. 12/01/17  Yes Elenora GammaBradshaw, Samuel L, MD  tiotropium (SPIRIVA) 18 MCG inhalation capsule Place 1 capsule (18 mcg total) into inhaler and inhale daily. 05/05/17  Yes Hawks, Christy A, FNP  traMADol (ULTRAM) 50 MG tablet Take 50 mg by mouth 2 (two) times daily as needed for moderate pain.  11/17/17  Yes [provider]    Allergies:   Patient has no known allergies.   Social History   Socioeconomic History  . Marital status: Legally Separated    Spouse name: None  . Number of children: None  . Years of education: None  . Highest education level: None  Social Needs  . Financial resource strain: None  . Food insecurity - worry: None  . Food insecurity - inability: None  . Transportation needs - medical: None  . Transportation needs - non-medical: None  Occupational History  . None  Tobacco Use  . Smoking status: Current Every Day Smoker    Packs/day: 0.50    Years: 30.00    Pack years: 15.00      Types: Cigarettes  . Smokeless tobacco: Never Used  . Tobacco comment: " I have  tried vapor cigarettes"  Substance and Sexual Activity  . Alcohol use: Yes    Alcohol/week: 0.0 oz    Comment: none now but previous  . Drug use: Yes    Types: Cocaine    Comment: Denies any recent use.  Pt has a Pain Contract. last use was 2016  . Sexual activity: None  Other Topics Concern  . None  Social History Narrative  . None     Family History:  The patient's family history includes Cancer in  his mother and sister; Diabetes in his father; Heart attack in his father; Hyperlipidemia in his mother; Hypertension in his mother.   ROS:   Please see the history of present illness.    ROS All other systems reviewed and are negative.   PHYSICAL EXAM:   VS:  BP 108/78   Pulse 84   Ht 5\' 7"  (1.702 m)   Wt (!) 338 lb 4 oz (153.4 kg)   SpO2 98%   BMI 52.98 kg/m    GEN: Well nourished, well developed, in no acute distress  HEENT: normal  Neck: no JVD, carotid bruits, or masses Cardiac: RRR; no murmurs, rubs, or gallops,no edema  Respiratory:  clear to auscultation bilaterally, normal work of breathing GI: soft, nontender, nondistended, + BS MS: no deformity or atrophy  Skin: warm and dry, no rash Neuro:  Alert and Oriented x 3, Strength and sensation are intact Psych: euthymic mood, full affect  Wt Readings from Last 3 Encounters:  12/03/17 (!) 338 lb 4 oz (153.4 kg)  11/20/17 (!) 335 lb 6.4 oz (152.1 kg)  10/31/17 (!) 331 lb 3.2 oz (150.2 kg)      Studies/Labs Reviewed:   EKG:  EKG is not ordered today.    Recent Labs: 09/11/2017: TSH 0.904 11/20/2017: ALT 20; BUN 10; Creatinine, Ser 1.01; Hemoglobin 15.5; Platelets 249; Potassium 3.8; Sodium 145   Lipid Panel    Component Value Date/Time   CHOL 178 09/05/2016 1606   TRIG 91 09/05/2016 1606   HDL 38 (L) 09/05/2016 1606   CHOLHDL 4.7 09/05/2016 1606   CHOLHDL 3.4 01/25/2015 0015   VLDL 16 01/25/2015 0015   LDLCALC 122 (H)  09/05/2016 1606    Additional studies/ records that were reviewed today include:   2-D echo 12/23/14 Study Conclusions  - Left ventricle: The cavity size was normal. Wall thickness was increased in a pattern of moderate LVH. Systolic function was normal. The estimated ejection fraction was in the range of 55% to 65%. - Left atrium: The atrium was moderately dilated.  Cardiac catheterization 12/22/14 IMPRESSIONS:  1. Widely patent left main coronary artery. 2. Severe disease in the mid left anterior descending artery with moderate disease further distal. The 95% stenosis was treated with a 2.5 x 24 Synergy drug-eluting stent, postdilated with a 3.0 noncompliant balloon. 3. Mild to moderate disease in the left circumflex artery and its branches. 4. Mild to moderate disease in the right coronary artery. 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 5. Left ventricular systolic function not assessed . Elevated LVEDP 26 mmHg.   Overall difficult cardiac cath from the right radial approach due to difficulty in locating the left main and RCA. He has a short ascending aorta.   ASSESSMENT & PLAN:    1. CAD s/p DES to mLAD -No angina.  Continue aspirin, Coreg and statin.  2. HTN -Stable and well controlled on current medication.  3. OSA on CPAP - Compliant. Managed by [facility].   4. Cardiac clearance - He is able to get at least 7.99 Mets of activity.  Okay to hold aspirin for 7 days prior to surgery and resume post procedure when  Stable. Given past medical history and time since last visit, based on ACC/AHA guidelines, BRADON FESTER would be at acceptable risk for the planned procedure without further cardiovascular testing.   I will route this recommendation to the requesting party via Epic fax function and remove from pre-op pool.  12/03/2017, 10:47 AM  5. HLD - LDL was 122 on 08/2016. Recent LFTS normal 11/20/17. Check LFT next week.     6.  Tobacco smoking -Encourage complete cessation.  He has cut back to half a pack a day.  7.  Morbid obesity -Encourage daily exercise and weight loss.  Medication Adjustments/Labs and Tests Ordered: Current medicines are reviewed at length with the patient today.  Concerns regarding medicines are outlined above.  Medication changes, Labs and Tests ordered today are listed in the Patient Instructions below. Patient Instructions  Medication Instructions:  Your physician recommends that you continue on your current medications as directed. Please refer to the Current Medication list given to you today.   Labwork: Your physician recommends that you return for lab work on Tuesday December 09, 2017 for FASTING LIPIDS. Nothing to eat or drink after midnight the night before these labs are drawn.   Testing/Procedures: None ordered today  Follow-Up: Your physician wants you to follow-up in 6 months with Dr. Elease Hashimoto. You will receive a reminder letter in the mail two months in advance. If you don't receive a letter, please call our office to schedule the follow-up appointment.   Any Other Special Instructions Will Be Listed Below (If Applicable).  If you need a refill on your cardiac medications before your next appointment, please call your pharmacy.      Lorelei Pont, [place]  12/03/2017 10:45 AM    [facility] [address] [place], [place], [place]  [address] Phone: [phone]; Fax: [phone]

## 2017-12-03 NOTE — Patient Instructions (Signed)
Medication Instructions:  Your physician recommends that you continue on your current medications as directed. Please refer to the Current Medication list given to you today.   Labwork: Your physician recommends that you return for lab work on Tuesday December 09, 2017 for FASTING LIPIDS. Nothing to eat or drink after midnight the night before these labs are drawn.   Testing/Procedures: None ordered today  Follow-Up: Your physician wants you to follow-up in 6 months with Dr. Elease Hashimoto. You will receive a reminder letter in the mail two months in advance. If you don't receive a letter, please call our office to schedule the follow-up appointment.   Any Other Special Instructions Will Be Listed Below (If Applicable).  If you need a refill on your cardiac medications before your next appointment, please call your pharmacy.

## 2017-12-05 ENCOUNTER — Ambulatory Visit: Payer: Medicaid Other | Admitting: Family Medicine

## 2017-12-09 ENCOUNTER — Other Ambulatory Visit: Payer: Medicaid Other | Admitting: *Deleted

## 2017-12-09 DIAGNOSIS — E785 Hyperlipidemia, unspecified: Secondary | ICD-10-CM

## 2017-12-09 LAB — LIPID PANEL
CHOL/HDL RATIO: 3.3 ratio (ref 0.0–5.0)
Cholesterol, Total: 117 mg/dL (ref 100–199)
HDL: 35 mg/dL — AB (ref >39)
LDL Calculated: 67 mg/dL (ref 0–99)
Triglycerides: 74 mg/dL (ref 0–149)
VLDL Cholesterol Cal: 15 mg/dL (ref 5–40)

## 2017-12-10 ENCOUNTER — Telehealth: Payer: Self-pay

## 2017-12-10 NOTE — Telephone Encounter (Signed)
Pt reports having some chest pains recently . He was seen by Chelsea AusVin Bhagat, PA a week ago for pre op clearence. He should not have shoulder surgery if he is having angina like symptoms. If the pains are similar to his previous angina  symptoms, he should be worked in a set up for a cath  If the pains are not the same as his angina , we will get a Lexiscan myoview prior to his shoulder surgery

## 2017-12-10 NOTE — Telephone Encounter (Signed)
While reviewing lab results with patient, pt stated he has been having some chest tightness. Pt stated on Monday he experienced some chest tightness while sitting watching tv, he took 2 nitro and 4-5 aspirin and symptoms resolved. Patient stated he had the same symptoms last night, took 1 nitro and symptoms resolved. Patient also stated he has been very stressed lately. Patient denies cp, palpitations, sob, nausea or vomiting now. Patient was seen by Georgianne FickVin, PA on 12/03/17. Informed patient I would send to Dr. Elease HashimotoNahser for further recommendations. Patient verbalized understanding and thankful for the call

## 2017-12-10 NOTE — Telephone Encounter (Signed)
Left message for patient to call back  

## 2017-12-11 ENCOUNTER — Other Ambulatory Visit: Payer: Self-pay | Admitting: Family

## 2017-12-11 NOTE — Telephone Encounter (Signed)
Reviewed cath instructions with patient who verbalized understanding. He asks to get his lab work on the morning of the cath since he just came yesterday for lab work at our office (we still need Pt/INR level). He verbalized understanding of medication instructions. I advised that Dr. Elease HashimotoNahser will call him Monday to review the risks and benefits of the procedure. He verbalized understanding and agreement with plan.

## 2017-12-11 NOTE — Telephone Encounter (Signed)
@  [email or web address]  [facility] [address] [facility] [address], [address] [place] [address] Dept: [phone] Loc: [phone]  Chris Powell  12/11/2017  You are scheduled for a Cardiac Catheterization on Tuesday, March 26 with Dr. Peter SwazilandJordan.  1. Please arrive [facility] : [address] [place], [place] [address] at 10:00 AM (two hours before your procedure to ensure your preparation). Free valet parking service is available.   Special note: Every effort is made to have your procedure done on time. Please understand that emergencies sometimes delay scheduled procedures.  2. Diet: Do not eat or drink anything after midnight prior to your procedure except sips of water to take medications.  3. Labs: Your labs will be performed at the hospital after you arrive for your procedure.  4. Medication instructions in preparation for your procedure:   Stop taking, Glucophage (Metformin) on Sunday, March 24.after your PM dose DO NOT RESTART Until Friday morning  Do not take Lasix on the morning of your procedure  On the morning of your procedure, take your Effient/Prasugrel and Aspirin and any morning medicines NOT listed above.  You may use sips of water.  5. Plan for one night stay--bring personal belongings. 6. Bring a current list of your medications and current insurance cards. 7. You MUST have a responsible person to drive you home. 8. Someone MUST be with you the first 24 hours after you arrive home or your discharge will be delayed. 9. Please wear clothes that are easy to get on and off and wear slip-on shoes.  Thank you for allowing us to care for you!   -- [facility]

## 2017-12-11 NOTE — Telephone Encounter (Signed)
Spoke with patient who states chest pain is intermittent since last week. He states he took NTG x 2 without relief, then chewed up 4 81 mg aspirins and the pain eased off. He states this event occurred on Monday and he has not had an episode like that since that time. He states this feels like the pain he had prior to previous cardiac cath. He is in agreement to have a cardiac cath and states he does not have a surgical date yet for his right shoulder surgery. He understands that he will need to postpone surgery until he is cleared from cardiac cath. I advised I will call him back with a plan for cath. He verbalized understanding and agreement and thanked me for the call.

## 2017-12-15 ENCOUNTER — Telehealth: Payer: Self-pay | Admitting: *Deleted

## 2017-12-15 NOTE — Telephone Encounter (Signed)
Effient removed from medication list.

## 2017-12-15 NOTE — Telephone Encounter (Addendum)
Pt [facility] for:  Tuesday December 16, 2017 12 Noon Verified [facility] and [facility] at: 10 AM Nothing to eat or drink after midnight prior to cath.  Hold medications: Metformin- pt states he took AM dose today, instructed not to take anymore metformin until 48 hours after cath. He verbalized understanding. Furosemide AM of cath Spironolactone AM of cath. Sildenafil -24 hours prior to cath  Except hold medications AM meds can be  taken pre-cath with sip of water including: ASA 81 mg  Confirmed patient has responsible person to drive home post procedure and observe patient for 24 hours: yes

## 2017-12-15 NOTE — Telephone Encounter (Signed)
I have called and discussed the risks, benefits, and options of cath I also instructed him to not restart his Effient at this point ( he has been off for several years )  I've answered his questions He understands and agrees to precede.

## 2017-12-16 ENCOUNTER — Ambulatory Visit (HOSPITAL_COMMUNITY)
Admission: RE | Admit: 2017-12-16 | Discharge: 2017-12-16 | Disposition: A | Discharge status: Home / Self Care | Payer: Medicaid Other | Source: Ambulatory Visit | Attending: Cardiology | Admitting: Cardiology

## 2017-12-16 ENCOUNTER — Encounter (HOSPITAL_COMMUNITY)
Admission: RE | Disposition: A | Discharge status: Home / Self Care | Payer: Self-pay | Source: Ambulatory Visit | Attending: Cardiology

## 2017-12-16 DIAGNOSIS — I10 Essential (primary) hypertension: Secondary | ICD-10-CM | POA: Diagnosis present

## 2017-12-16 DIAGNOSIS — Z79899 Other long term (current) drug therapy: Secondary | ICD-10-CM | POA: Insufficient documentation

## 2017-12-16 DIAGNOSIS — Z7984 Long term (current) use of oral hypoglycemic drugs: Secondary | ICD-10-CM | POA: Diagnosis not present

## 2017-12-16 DIAGNOSIS — I2510 Atherosclerotic heart disease of native coronary artery without angina pectoris: Secondary | ICD-10-CM

## 2017-12-16 DIAGNOSIS — I209 Angina pectoris, unspecified: Secondary | ICD-10-CM | POA: Diagnosis present

## 2017-12-16 DIAGNOSIS — Z9861 Coronary angioplasty status: Secondary | ICD-10-CM

## 2017-12-16 DIAGNOSIS — I25119 Atherosclerotic heart disease of native coronary artery with unspecified angina pectoris: Secondary | ICD-10-CM | POA: Diagnosis not present

## 2017-12-16 DIAGNOSIS — J449 Chronic obstructive pulmonary disease, unspecified: Secondary | ICD-10-CM | POA: Insufficient documentation

## 2017-12-16 DIAGNOSIS — E785 Hyperlipidemia, unspecified: Secondary | ICD-10-CM | POA: Insufficient documentation

## 2017-12-16 DIAGNOSIS — G4733 Obstructive sleep apnea (adult) (pediatric): Secondary | ICD-10-CM | POA: Diagnosis not present

## 2017-12-16 DIAGNOSIS — F17210 Nicotine dependence, cigarettes, uncomplicated: Secondary | ICD-10-CM | POA: Insufficient documentation

## 2017-12-16 DIAGNOSIS — Z7982 Long term (current) use of aspirin: Secondary | ICD-10-CM | POA: Diagnosis not present

## 2017-12-16 DIAGNOSIS — E119 Type 2 diabetes mellitus without complications: Secondary | ICD-10-CM | POA: Diagnosis not present

## 2017-12-16 DIAGNOSIS — IMO0001 Reserved for inherently not codable concepts without codable children: Secondary | ICD-10-CM | POA: Diagnosis present

## 2017-12-16 DIAGNOSIS — F17200 Nicotine dependence, unspecified, uncomplicated: Secondary | ICD-10-CM | POA: Diagnosis present

## 2017-12-16 DIAGNOSIS — Z01810 Encounter for preprocedural cardiovascular examination: Secondary | ICD-10-CM | POA: Diagnosis not present

## 2017-12-16 DIAGNOSIS — Z955 Presence of coronary angioplasty implant and graft: Secondary | ICD-10-CM | POA: Diagnosis not present

## 2017-12-16 DIAGNOSIS — I252 Old myocardial infarction: Secondary | ICD-10-CM | POA: Diagnosis not present

## 2017-12-16 DIAGNOSIS — Z6841 Body Mass Index (BMI) 40.0 and over, adult: Secondary | ICD-10-CM | POA: Insufficient documentation

## 2017-12-16 HISTORY — PX: LEFT HEART CATH AND CORONARY ANGIOGRAPHY: CATH118249

## 2017-12-16 LAB — CBC
HEMATOCRIT: 44.1 % (ref 39.0–52.0)
Hemoglobin: 14.6 g/dL (ref 13.0–17.0)
MCH: 30.6 pg (ref 26.0–34.0)
MCHC: 33.1 g/dL (ref 30.0–36.0)
MCV: 92.5 fL (ref 78.0–100.0)
PLATELETS: 256 10*3/uL (ref 150–400)
RBC: 4.77 MIL/uL (ref 4.22–5.81)
RDW: 14 % (ref 11.5–15.5)
WBC: 7.4 10*3/uL (ref 4.0–10.5)

## 2017-12-16 LAB — PROTIME-INR
INR: 1.03
Prothrombin Time: 13.4 seconds (ref 11.4–15.2)

## 2017-12-16 LAB — BASIC METABOLIC PANEL
Anion gap: 8 (ref 5–15)
BUN: 11 mg/dL (ref 6–20)
CO2: 23 mmol/L (ref 22–32)
CREATININE: 0.86 mg/dL (ref 0.61–1.24)
Calcium: 8.9 mg/dL (ref 8.9–10.3)
Chloride: 106 mmol/L (ref 101–111)
GFR calc Af Amer: >60 mL/min (ref >60)
GLUCOSE: 98 mg/dL (ref 65–99)
Potassium: 4.2 mmol/L (ref 3.5–5.1)
Sodium: 137 mmol/L (ref 135–145)

## 2017-12-16 LAB — GLUCOSE, CAPILLARY: GLUCOSE-CAPILLARY: 118 mg/dL — AB (ref 65–99)

## 2017-12-16 SURGERY — LEFT HEART CATH AND CORONARY ANGIOGRAPHY
Anesthesia: LOCAL

## 2017-12-16 MED ORDER — SODIUM CHLORIDE 0.9 % IV SOLN: 250.0000 mL | INTRAVENOUS | Status: DC | PRN

## 2017-12-16 MED ORDER — IOPAMIDOL (ISOVUE-370) INJECTION 76%
INTRAVENOUS | Status: AC
  Filled 2017-12-16: qty 100

## 2017-12-16 MED ORDER — FENTANYL CITRATE (PF) 100 MCG/2ML IJ SOLN
INTRAMUSCULAR | Status: DC | PRN
  Administered 2017-12-16: 25 ug via INTRAVENOUS

## 2017-12-16 MED ORDER — SODIUM CHLORIDE 0.9% FLUSH: 3.0000 mL | Freq: Two times a day (BID) | INTRAVENOUS | Status: DC

## 2017-12-16 MED ORDER — METFORMIN HCL ER 500 MG PO TB24: 500.0000 mg | ORAL_TABLET | Freq: Every day | ORAL | 1 refills | Status: DC

## 2017-12-16 MED ORDER — FENTANYL CITRATE (PF) 100 MCG/2ML IJ SOLN
INTRAMUSCULAR | Status: AC
  Filled 2017-12-16: qty 2

## 2017-12-16 MED ORDER — SODIUM CHLORIDE 0.9 % WEIGHT BASED INFUSION: 1.0000 mL/kg/h | INTRAVENOUS | Status: DC

## 2017-12-16 MED ORDER — SODIUM CHLORIDE 0.9 % IV SOLN: INTRAVENOUS | Status: DC

## 2017-12-16 MED ORDER — ACETAMINOPHEN 325 MG PO TABS: 650.0000 mg | ORAL_TABLET | ORAL | Status: DC | PRN

## 2017-12-16 MED ORDER — VERAPAMIL HCL 2.5 MG/ML IV SOLN
INTRAVENOUS | Status: AC
  Filled 2017-12-16: qty 2

## 2017-12-16 MED ORDER — SODIUM CHLORIDE 0.9 % WEIGHT BASED INFUSION
3.0000 mL/kg/h | INTRAVENOUS | Status: AC
  Administered 2017-12-16: 3 mL/kg/h via INTRAVENOUS

## 2017-12-16 MED ORDER — LIDOCAINE HCL (PF) 1 % IJ SOLN
INTRAMUSCULAR | Status: DC | PRN
  Administered 2017-12-16: 2 mL

## 2017-12-16 MED ORDER — HEPARIN (PORCINE) IN NACL 2-0.9 UNIT/ML-% IJ SOLN
INTRAMUSCULAR | Status: AC | PRN
  Administered 2017-12-16 (×2): 500 mL via INTRA_ARTERIAL

## 2017-12-16 MED ORDER — MIDAZOLAM HCL 2 MG/2ML IJ SOLN
INTRAMUSCULAR | Status: AC
  Filled 2017-12-16: qty 2

## 2017-12-16 MED ORDER — HEPARIN SODIUM (PORCINE) 1000 UNIT/ML IJ SOLN
INTRAMUSCULAR | Status: AC
  Filled 2017-12-16: qty 1

## 2017-12-16 MED ORDER — ONDANSETRON HCL 4 MG/2ML IJ SOLN: 4.0000 mg | Freq: Four times a day (QID) | INTRAMUSCULAR | Status: DC | PRN

## 2017-12-16 MED ORDER — VERAPAMIL HCL 2.5 MG/ML IV SOLN
INTRAVENOUS | Status: DC | PRN
  Administered 2017-12-16: 13:00:00 via INTRA_ARTERIAL

## 2017-12-16 MED ORDER — MIDAZOLAM HCL 2 MG/2ML IJ SOLN
INTRAMUSCULAR | Status: DC | PRN
  Administered 2017-12-16: 2 mg via INTRAVENOUS

## 2017-12-16 MED ORDER — IOPAMIDOL (ISOVUE-370) INJECTION 76%
INTRAVENOUS | Status: DC | PRN
  Administered 2017-12-16: 100 mL via INTRA_ARTERIAL

## 2017-12-16 MED ORDER — ASPIRIN 81 MG PO CHEW: 81.0000 mg | CHEWABLE_TABLET | ORAL | Status: DC

## 2017-12-16 MED ORDER — LIDOCAINE HCL 1 % IJ SOLN
INTRAMUSCULAR | Status: AC
  Filled 2017-12-16: qty 20

## 2017-12-16 MED ORDER — HEPARIN (PORCINE) IN NACL 2-0.9 UNIT/ML-% IJ SOLN
INTRAMUSCULAR | Status: AC
  Filled 2017-12-16: qty 1000

## 2017-12-16 MED ORDER — HEPARIN SODIUM (PORCINE) 1000 UNIT/ML IJ SOLN
INTRAMUSCULAR | Status: DC | PRN
  Administered 2017-12-16: 6000 [IU] via INTRAVENOUS

## 2017-12-16 MED ORDER — SODIUM CHLORIDE 0.9% FLUSH: 3.0000 mL | INTRAVENOUS | Status: DC | PRN

## 2017-12-16 SURGICAL SUPPLY — 14 items
BAND ZEPHYR COMPRESS 30 LONG (HEMOSTASIS) ×2 IMPLANT
CATH INFINITI 5FR AL1 (CATHETERS) ×2 IMPLANT
CATH INFINITI 5FR ANG PIGTAIL (CATHETERS) ×2 IMPLANT
CATH INFINITI 5FR JL4 (CATHETERS) ×2 IMPLANT
GUIDEWIRE INQWIRE 1.5J.035X260 (WIRE) ×1 IMPLANT
HOVERMATT SINGLE USE (MISCELLANEOUS) ×2 IMPLANT
INQWIRE 1.5J .035X260CM (WIRE) ×2
KIT HEART LEFT (KITS) ×2 IMPLANT
NEEDLE PERC 21GX4CM (NEEDLE) ×2 IMPLANT
PACK CARDIAC CATHETERIZATION (CUSTOM PROCEDURE TRAY) ×2 IMPLANT
SHEATH RAIN RADIAL 21G 6FR (SHEATH) ×2 IMPLANT
SYR MEDRAD MARK V 150ML (SYRINGE) ×2 IMPLANT
TRANSDUCER W/STOPCOCK (MISCELLANEOUS) ×2 IMPLANT
TUBING CIL FLEX 10 FLL-RA (TUBING) ×2 IMPLANT

## 2017-12-16 NOTE — Interval H&P Note (Signed)
History and Physical Interval Note:  12/16/2017 12:24 PM  Chris Powell  has presented today for surgery, with the diagnosis of cad with unstable angina  The various methods of treatment have been discussed with the patient and family. After consideration of risks, benefits and other options for treatment, the patient has consented to  Procedure(s): LEFT HEART CATH AND CORONARY ANGIOGRAPHY (N/A) as a surgical intervention .  The patient's history has been reviewed, patient examined, no change in status, stable for surgery.  I have reviewed the patient's chart and labs.  Questions were answered to the patient's satisfaction.    Cath Lab Visit (complete for each Cath Lab visit)  Clinical Evaluation Leading to the Procedure:   ACS: No.  Non-ACS:    Anginal Classification: CCS II  Anti-ischemic medical therapy: Maximal Therapy (2 or more classes of medications)  Non-Invasive Test Results: No non-invasive testing performed  Prior CABG: No previous CABG       Theron Aristaeter [facility] HospitalJordanMD,FACC 12/16/2017 12:24 PM

## 2017-12-16 NOTE — Discharge Instructions (Signed)

## 2017-12-17 ENCOUNTER — Encounter (HOSPITAL_COMMUNITY): Payer: Self-pay | Admitting: Cardiology

## 2017-12-17 MED FILL — Heparin Sodium (Porcine) 2 Unit/ML in Sodium Chloride 0.9%: INTRAMUSCULAR | Qty: 1000 | Status: AC

## 2017-12-17 MED FILL — Lidocaine HCl Local Inj 1%: INTRAMUSCULAR | Qty: 20 | Status: AC

## 2017-12-18 ENCOUNTER — Telehealth: Payer: Self-pay | Admitting: Family Medicine

## 2017-12-18 ENCOUNTER — Encounter: Payer: Self-pay | Admitting: *Deleted

## 2017-12-18 NOTE — Telephone Encounter (Signed)
This was signed. If it can not be found can we get another form and I will sign

## 2017-12-18 NOTE — Telephone Encounter (Signed)
Pt aware - we re-faxed the clearance form

## 2017-12-18 NOTE — Telephone Encounter (Signed)
Pt [facility] - went to cardio as well - (cleared by cardio) Eulah PontMurphy and Thurston HoleWainer still hasn't [facility] .  Do you have the form?

## 2017-12-22 ENCOUNTER — Other Ambulatory Visit: Payer: Self-pay | Admitting: Family Medicine

## 2017-12-25 ENCOUNTER — Telehealth: Payer: Self-pay | Admitting: Family Medicine

## 2017-12-25 ENCOUNTER — Other Ambulatory Visit: Payer: Self-pay | Admitting: Family Medicine

## 2017-12-25 ENCOUNTER — Telehealth: Payer: Self-pay | Admitting: Cardiovascular Disease

## 2017-12-25 MED ORDER — SILDENAFIL CITRATE 20 MG PO TABS
ORAL_TABLET | ORAL | 6 refills | Status: DC
Start: 2017-12-25 — End: 2019-02-17

## 2017-12-25 NOTE — Addendum Note (Signed)
Addended by: Demetrios LollBARNARD, CATHY C on: 12/25/2017 02:25 PM   Modules accepted: Orders

## 2017-12-25 NOTE — Telephone Encounter (Signed)
Dr. Elease HashimotoNahser agrees to refill this medication

## 2017-12-25 NOTE — Telephone Encounter (Signed)
It is contraindicated with nitrates. I would recommend discussing with cardiology.   Chris SinkSam Reham Slabaugh, MD [facility] 12/25/2017, 11:55 AM

## 2017-12-25 NOTE — Telephone Encounter (Signed)
Pt calling requesting a refill on sildenafil, pt's PCP has been refilling this medication. I called pt to inform him that his PCP has been refilling this medication and pt stated that his PCP told him to contact Dr. Elease HashimotoNahser because he started this medication. Would Dr. Elease HashimotoNahser like to refill this medication? Please address

## 2017-12-25 NOTE — Telephone Encounter (Signed)
Pt aware of MD feedback. 

## 2017-12-25 NOTE — Telephone Encounter (Signed)
Patient is calling wanting to see why his sildenafil was denied. Patient states he has been getting for years? Patient states he does not take with Nitro.

## 2017-12-25 NOTE — Telephone Encounter (Signed)
°*  STAT* If patient is at the pharmacy, call can be transferred to refill team.   1. Which medications need to be refilled? (please list name of each medication and dose if known) Viagara  2. Which pharmacy/location (including street and city if local pharmacy) is medication to be sent to?[facility]  3. Do they need a 30 day or 90 day supply? 30  Please call once the prescription has been sent

## 2017-12-25 NOTE — Telephone Encounter (Signed)
Per Eligha BridegroomMichelle Swinyer, RN, Dr. Harvie BridgeNahser's nurse to refill sildenafil 20 mg tablets, dispensing 50 tablets with 6 refills. Pt's medication was sent to pt's pharmacy as requested. Confirmation received.

## 2017-12-31 ENCOUNTER — Other Ambulatory Visit: Payer: Self-pay | Admitting: Orthopedic Surgery

## 2017-12-31 ENCOUNTER — Telehealth: Payer: Self-pay | Admitting: Physician Assistant

## 2017-12-31 NOTE — Telephone Encounter (Signed)
Sherri calling with Dr. Marchia Bond Raliegh Ip)   States that she received cardiac clearance on 12-03-17 but then patient had a cath on 12-16-17. Sherri would like to verify if patient is still cleared for surgery.  Sherri states that you may leave a detailed vm on her confidential voicemail or you can fax clearance to [phone]   1. What type of surgery is being performed? RIGHT SHOULDER SCOPE AND ROTATOR CUFF REPAIR   2. When is this surgery scheduled? TBD    3. Are there any medications that need to be held prior to surgery and how long? PT IS ON EFFIENT AND ASA  4. Practice name and name of physician performing surgery? [facility]; DR. Vonna Kotyk LANDAU  5. What is your office phone and fax number? PH# [phone] EXT [phone]; FAX# [phone]   1. Anesthesia type (None, local, MAC, general) ? LEFT MESSAGE FOR SHERRI AT [facility] TO CALL BACK WITH TYPE OF ANESTHESIA BEING USED.

## 2018-01-01 NOTE — Telephone Encounter (Signed)
The patient has been seen by Chelsea AusVin Bhagat, PA and has been cleared for surgery  I would prefer that he continue his ASA if possible but if the surgery cannot be done safely on ASA, then it will be ok for him to hold his ASA for 5 days prior to procedure .

## 2018-01-01 NOTE — Telephone Encounter (Signed)
   Primary Cardiologist:Philip Nahser, MD  Chart reviewed as part of pre-operative protocol coverage. Because of Alan RipperMaurice D Vien's past medical history and time since last visit, he/she will require a follow-up visit in order to better assess preoperative cardiovascular risk.  Pre-op covering staff: - Please schedule appointment and call patient to inform them. - Please contact requesting surgeon's office via preferred method (i.e, phone, fax) to inform them of need for appointment prior to surgery.   Pt underwent heart catheterization on 12/16/17 and does not appear to have a follow up appt scheduled. Please schedule with an APP, who can also complete preoperative clearance.   I will route this to Dr. Elease HashimotoNahser for guidance on ASA and effient.  Dr. Elease HashimotoNahser, if you feel that the patient can be cleared for surgery since no intervention was made during heart cath, please route message to the preop pool and we will send clearance to her surgeon that is not contingent on her follow up appt with us post-cath.  Roe RutherfordAngela Nicole Duke, PA  01/01/2018, 3:36 PM

## 2018-01-01 NOTE — Telephone Encounter (Signed)
Left message for Sherri, Surgical Coordinator @ [facility]. When I called pt to get him scheduled, he informed me that his surgery has been scheduled for 01/08/18 and that [facility] already had clearance and Sherri had already told him when to stop his Aspirin. I do not see clearance in our records, so [facility]

## 2018-01-01 NOTE — Telephone Encounter (Signed)
New message  Chris Powell verbalized that she is returning call for RN

## 2018-01-02 NOTE — Telephone Encounter (Signed)
Sheri returned the call re: cardiac clearance and specific information that was missing off the Clearance form. Per Lavonna RuaSheri, she will refax a new one with the information on it.  I thanked her for the call back.

## 2018-01-02 NOTE — Telephone Encounter (Signed)
   Primary Cardiologist: Kristeen MissPhilip Nahser, MD  Chart reviewed as part of pre-operative protocol coverage. Patient was contacted 01/02/2018 in reference to pre-operative risk assessment for pending surgery as outlined below.  Chris Powell was last seen on 12/03/17 by V. Bhagat.  Since that day, Chris Powell has done well. He underwent heart catheterization on 12/16/17 with patent stent, no intervention.  In consultation with Dr. Elease HashimotoNahser, he has been cleared for surgery. He would prefer to continue ASA, but may hold it for 5 days if needed.  Therefore, based on ACC/AHA guidelines, the patient would be at acceptable risk for the planned procedure without further cardiovascular testing.   I will route this recommendation to the requesting party via Epic fax function and remove from pre-op pool.  Please call with questions.  Roe RutherfordAngela Nicole Emlyn Maves, PA 01/02/2018, 2:34 PM

## 2018-01-02 NOTE — Telephone Encounter (Signed)
Follow Up: ° ° ° °Returning your call from yesterday. °

## 2018-01-02 NOTE — Telephone Encounter (Signed)
Returned [employer] CompanySheri's call from [facility]. Left her a message to call back.

## 2018-01-05 ENCOUNTER — Other Ambulatory Visit: Payer: Self-pay

## 2018-01-05 ENCOUNTER — Emergency Department (HOSPITAL_COMMUNITY): Payer: Medicaid Other

## 2018-01-05 ENCOUNTER — Encounter (HOSPITAL_COMMUNITY): Payer: Self-pay | Admitting: Emergency Medicine

## 2018-01-05 ENCOUNTER — Encounter (HOSPITAL_COMMUNITY): Payer: Self-pay | Admitting: *Deleted

## 2018-01-05 ENCOUNTER — Emergency Department (HOSPITAL_COMMUNITY)
Admission: EM | Admit: 2018-01-05 | Discharge: 2018-01-06 | Disposition: A | Discharge status: Home / Self Care | Payer: Medicaid Other | Source: Home / Self Care | Attending: Emergency Medicine | Admitting: Emergency Medicine

## 2018-01-05 DIAGNOSIS — I252 Old myocardial infarction: Secondary | ICD-10-CM | POA: Diagnosis not present

## 2018-01-05 DIAGNOSIS — I2510 Atherosclerotic heart disease of native coronary artery without angina pectoris: Secondary | ICD-10-CM

## 2018-01-05 DIAGNOSIS — Z7982 Long term (current) use of aspirin: Secondary | ICD-10-CM | POA: Insufficient documentation

## 2018-01-05 DIAGNOSIS — Z7984 Long term (current) use of oral hypoglycemic drugs: Secondary | ICD-10-CM | POA: Diagnosis not present

## 2018-01-05 DIAGNOSIS — E119 Type 2 diabetes mellitus without complications: Secondary | ICD-10-CM

## 2018-01-05 DIAGNOSIS — G4730 Sleep apnea, unspecified: Secondary | ICD-10-CM | POA: Diagnosis not present

## 2018-01-05 DIAGNOSIS — I10 Essential (primary) hypertension: Secondary | ICD-10-CM | POA: Diagnosis not present

## 2018-01-05 DIAGNOSIS — Y999 Unspecified external cause status: Secondary | ICD-10-CM | POA: Insufficient documentation

## 2018-01-05 DIAGNOSIS — K219 Gastro-esophageal reflux disease without esophagitis: Secondary | ICD-10-CM | POA: Diagnosis not present

## 2018-01-05 DIAGNOSIS — Z23 Encounter for immunization: Secondary | ICD-10-CM | POA: Insufficient documentation

## 2018-01-05 DIAGNOSIS — M7541 Impingement syndrome of right shoulder: Secondary | ICD-10-CM | POA: Diagnosis not present

## 2018-01-05 DIAGNOSIS — M75101 Unspecified rotator cuff tear or rupture of right shoulder, not specified as traumatic: Secondary | ICD-10-CM | POA: Diagnosis present

## 2018-01-05 DIAGNOSIS — Y939 Activity, unspecified: Secondary | ICD-10-CM

## 2018-01-05 DIAGNOSIS — J449 Chronic obstructive pulmonary disease, unspecified: Secondary | ICD-10-CM | POA: Diagnosis not present

## 2018-01-05 DIAGNOSIS — F329 Major depressive disorder, single episode, unspecified: Secondary | ICD-10-CM | POA: Diagnosis not present

## 2018-01-05 DIAGNOSIS — Y929 Unspecified place or not applicable: Secondary | ICD-10-CM | POA: Insufficient documentation

## 2018-01-05 DIAGNOSIS — E785 Hyperlipidemia, unspecified: Secondary | ICD-10-CM | POA: Diagnosis not present

## 2018-01-05 DIAGNOSIS — X58XXXA Exposure to other specified factors, initial encounter: Secondary | ICD-10-CM | POA: Insufficient documentation

## 2018-01-05 DIAGNOSIS — Z79899 Other long term (current) drug therapy: Secondary | ICD-10-CM | POA: Insufficient documentation

## 2018-01-05 DIAGNOSIS — S0280XA Fracture of other specified skull and facial bones, unspecified side, initial encounter for closed fracture: Secondary | ICD-10-CM

## 2018-01-05 DIAGNOSIS — F17210 Nicotine dependence, cigarettes, uncomplicated: Secondary | ICD-10-CM

## 2018-01-05 DIAGNOSIS — Z955 Presence of coronary angioplasty implant and graft: Secondary | ICD-10-CM | POA: Diagnosis not present

## 2018-01-05 DIAGNOSIS — S0285XA Fracture of orbit, unspecified, initial encounter for closed fracture: Secondary | ICD-10-CM

## 2018-01-05 LAB — CBC
HCT: 40.9 % (ref 39.0–52.0)
Hemoglobin: 13.7 g/dL (ref 13.0–17.0)
MCH: 30.1 pg (ref 26.0–34.0)
MCHC: 33.5 g/dL (ref 30.0–36.0)
MCV: 89.9 fL (ref 78.0–100.0)
Platelets: 278 10*3/uL (ref 150–400)
RBC: 4.55 MIL/uL (ref 4.22–5.81)
RDW: 13.6 % (ref 11.5–15.5)
WBC: 10.2 10*3/uL (ref 4.0–10.5)

## 2018-01-05 LAB — BASIC METABOLIC PANEL
Anion gap: 11 (ref 5–15)
BUN: 16 mg/dL (ref 6–20)
CHLORIDE: 104 mmol/L (ref 101–111)
CO2: 22 mmol/L (ref 22–32)
CREATININE: 1.11 mg/dL (ref 0.61–1.24)
Calcium: 9.1 mg/dL (ref 8.9–10.3)
GFR calc Af Amer: >60 mL/min (ref >60)
Glucose, Bld: 88 mg/dL (ref 65–99)
Potassium: 3.5 mmol/L (ref 3.5–5.1)
SODIUM: 137 mmol/L (ref 135–145)

## 2018-01-05 MED ORDER — CEFAZOLIN SODIUM 10 G IJ SOLR
3.0000 g | INTRAMUSCULAR | Status: AC
  Administered 2018-01-06: 3 g via INTRAVENOUS
  Filled 2018-01-05: qty 3
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
## 2018-01-06 ENCOUNTER — Encounter (HOSPITAL_COMMUNITY)
Admission: RE | Disposition: A | Discharge status: Home / Self Care | Payer: Self-pay | Source: Ambulatory Visit | Attending: Orthopedic Surgery

## 2018-01-06 ENCOUNTER — Encounter (HOSPITAL_COMMUNITY): Payer: Self-pay | Admitting: *Deleted

## 2018-01-06 ENCOUNTER — Ambulatory Visit (HOSPITAL_COMMUNITY): Payer: Medicaid Other | Admitting: Certified Registered"

## 2018-01-06 ENCOUNTER — Observation Stay (HOSPITAL_COMMUNITY)
Admission: RE | Admit: 2018-01-06 | Discharge: 2018-01-07 | Disposition: A | Discharge status: Home / Self Care | Payer: Medicaid Other | Source: Ambulatory Visit | Attending: Orthopedic Surgery | Admitting: Orthopedic Surgery

## 2018-01-06 ENCOUNTER — Encounter (HOSPITAL_COMMUNITY): Payer: Self-pay | Admitting: Emergency Medicine

## 2018-01-06 DIAGNOSIS — F329 Major depressive disorder, single episode, unspecified: Secondary | ICD-10-CM | POA: Insufficient documentation

## 2018-01-06 DIAGNOSIS — M7541 Impingement syndrome of right shoulder: Secondary | ICD-10-CM | POA: Diagnosis not present

## 2018-01-06 DIAGNOSIS — I2510 Atherosclerotic heart disease of native coronary artery without angina pectoris: Secondary | ICD-10-CM | POA: Diagnosis not present

## 2018-01-06 DIAGNOSIS — M75101 Unspecified rotator cuff tear or rupture of right shoulder, not specified as traumatic: Principal | ICD-10-CM

## 2018-01-06 DIAGNOSIS — F17210 Nicotine dependence, cigarettes, uncomplicated: Secondary | ICD-10-CM | POA: Insufficient documentation

## 2018-01-06 DIAGNOSIS — E119 Type 2 diabetes mellitus without complications: Secondary | ICD-10-CM | POA: Insufficient documentation

## 2018-01-06 DIAGNOSIS — I252 Old myocardial infarction: Secondary | ICD-10-CM | POA: Insufficient documentation

## 2018-01-06 DIAGNOSIS — J449 Chronic obstructive pulmonary disease, unspecified: Secondary | ICD-10-CM | POA: Insufficient documentation

## 2018-01-06 DIAGNOSIS — G4730 Sleep apnea, unspecified: Secondary | ICD-10-CM | POA: Insufficient documentation

## 2018-01-06 DIAGNOSIS — I10 Essential (primary) hypertension: Secondary | ICD-10-CM | POA: Diagnosis not present

## 2018-01-06 DIAGNOSIS — Z955 Presence of coronary angioplasty implant and graft: Secondary | ICD-10-CM | POA: Insufficient documentation

## 2018-01-06 DIAGNOSIS — E785 Hyperlipidemia, unspecified: Secondary | ICD-10-CM | POA: Insufficient documentation

## 2018-01-06 DIAGNOSIS — Z7982 Long term (current) use of aspirin: Secondary | ICD-10-CM | POA: Insufficient documentation

## 2018-01-06 DIAGNOSIS — K219 Gastro-esophageal reflux disease without esophagitis: Secondary | ICD-10-CM | POA: Insufficient documentation

## 2018-01-06 DIAGNOSIS — Z79899 Other long term (current) drug therapy: Secondary | ICD-10-CM | POA: Insufficient documentation

## 2018-01-06 DIAGNOSIS — Z7984 Long term (current) use of oral hypoglycemic drugs: Secondary | ICD-10-CM | POA: Insufficient documentation

## 2018-01-06 HISTORY — DX: Anemia, unspecified: D64.9

## 2018-01-06 HISTORY — DX: Unspecified rotator cuff tear or rupture of right shoulder, not specified as traumatic: M75.101

## 2018-01-06 HISTORY — PX: SHOULDER ARTHROSCOPY WITH ROTATOR CUFF REPAIR AND SUBACROMIAL DECOMPRESSION: SHX5686

## 2018-01-06 LAB — GLUCOSE, CAPILLARY
GLUCOSE-CAPILLARY: 83 mg/dL (ref 65–99)
GLUCOSE-CAPILLARY: 122 mg/dL — AB (ref 65–99)
Glucose-Capillary: 82 mg/dL (ref 65–99)
Glucose-Capillary: 166 mg/dL — ABNORMAL HIGH (ref 65–99)

## 2018-01-06 SURGERY — SHOULDER ARTHROSCOPY WITH ROTATOR CUFF REPAIR AND SUBACROMIAL DECOMPRESSION
Anesthesia: General | Site: Shoulder | Laterality: Right

## 2018-01-06 MED ORDER — MIDAZOLAM HCL 2 MG/2ML IJ SOLN
1.0000 mg | Freq: Once | INTRAMUSCULAR | Status: AC
  Administered 2018-01-06: 1 mg via INTRAVENOUS

## 2018-01-06 MED ORDER — LIDOCAINE 2% (20 MG/ML) 5 ML SYRINGE
INTRAMUSCULAR | Status: DC | PRN
  Administered 2018-01-06: 40 mg via INTRAVENOUS

## 2018-01-06 MED ORDER — DEXAMETHASONE SODIUM PHOSPHATE 10 MG/ML IJ SOLN
INTRAMUSCULAR | Status: AC
  Filled 2018-01-06: qty 1

## 2018-01-06 MED ORDER — DEXTROSE 5 % IV SOLN
INTRAVENOUS | Status: DC | PRN
  Administered 2018-01-06: 40 ug/min via INTRAVENOUS

## 2018-01-06 MED ORDER — MIDAZOLAM HCL 2 MG/2ML IJ SOLN
INTRAMUSCULAR | Status: AC
  Filled 2018-01-06: qty 2

## 2018-01-06 MED ORDER — ZOLPIDEM TARTRATE 5 MG PO TABS: 5.0000 mg | ORAL_TABLET | Freq: Every evening | ORAL | Status: DC | PRN

## 2018-01-06 MED ORDER — LIDOCAINE 2% (20 MG/ML) 5 ML SYRINGE
INTRAMUSCULAR | Status: AC
Start: 2018-01-06 — End: ?
  Filled 2018-01-06: qty 5

## 2018-01-06 MED ORDER — METHOCARBAMOL 1000 MG/10ML IJ SOLN: 500.0000 mg | Freq: Four times a day (QID) | INTRAVENOUS | Status: DC | PRN

## 2018-01-06 MED ORDER — ONDANSETRON HCL 4 MG/2ML IJ SOLN: 4.0000 mg | Freq: Once | INTRAMUSCULAR | Status: DC | PRN

## 2018-01-06 MED ORDER — HYDRALAZINE HCL 20 MG/ML IJ SOLN
20.0000 mg | Freq: Once | INTRAMUSCULAR | Status: AC
Start: 2018-01-06 — End: 2018-01-06
  Administered 2018-01-06: 20 mg via INTRAVENOUS

## 2018-01-06 MED ORDER — BISACODYL 10 MG RE SUPP: 10.0000 mg | Freq: Every day | RECTAL | Status: DC | PRN

## 2018-01-06 MED ORDER — BUPIVACAINE-EPINEPHRINE (PF) 0.5% -1:200000 IJ SOLN
INTRAMUSCULAR | Status: DC | PRN
  Administered 2018-01-06: 30 mL via PERINEURAL

## 2018-01-06 MED ORDER — MAGNESIUM CITRATE PO SOLN: 1.0000 | Freq: Once | ORAL | Status: DC | PRN

## 2018-01-06 MED ORDER — DOCUSATE SODIUM 100 MG PO CAPS
100.0000 mg | ORAL_CAPSULE | Freq: Two times a day (BID) | ORAL | Status: DC
  Administered 2018-01-06: 100 mg via ORAL
  Filled 2018-01-06: qty 1

## 2018-01-06 MED ORDER — ROCURONIUM BROMIDE 10 MG/ML (PF) SYRINGE
PREFILLED_SYRINGE | INTRAVENOUS | Status: DC | PRN
  Administered 2018-01-06: 20 mg via INTRAVENOUS
  Administered 2018-01-06: 60 mg via INTRAVENOUS

## 2018-01-06 MED ORDER — PROPOFOL 10 MG/ML IV BOLUS
INTRAVENOUS | Status: DC | PRN
  Administered 2018-01-06: 200 mg via INTRAVENOUS

## 2018-01-06 MED ORDER — POTASSIUM CHLORIDE IN NACL 20-0.45 MEQ/L-% IV SOLN
INTRAVENOUS | Status: DC
  Filled 2018-01-06: qty 1000

## 2018-01-06 MED ORDER — HYDROMORPHONE HCL 2 MG/ML IJ SOLN
0.2500 mg | INTRAMUSCULAR | Status: DC | PRN
  Administered 2018-01-06: 0.5 mg via INTRAVENOUS

## 2018-01-06 MED ORDER — ONDANSETRON HCL 4 MG/2ML IJ SOLN
INTRAMUSCULAR | Status: DC | PRN
  Administered 2018-01-06: 4 mg via INTRAVENOUS

## 2018-01-06 MED ORDER — MEPERIDINE HCL 50 MG/ML IJ SOLN: 6.2500 mg | INTRAMUSCULAR | Status: DC | PRN

## 2018-01-06 MED ORDER — HYDRALAZINE HCL 20 MG/ML IJ SOLN
INTRAMUSCULAR | Status: AC
  Filled 2018-01-06: qty 1

## 2018-01-06 MED ORDER — AMOXICILLIN-POT CLAVULANATE 875-125 MG PO TABS: 1.0000 | ORAL_TABLET | Freq: Two times a day (BID) | ORAL | 0 refills | Status: DC

## 2018-01-06 MED ORDER — LACTATED RINGERS IV SOLN
INTRAVENOUS | Status: DC
Start: 2018-01-06 — End: 2018-01-06
  Administered 2018-01-06 (×2): via INTRAVENOUS

## 2018-01-06 MED ORDER — CLONIDINE HCL 0.1 MG PO TABS
0.3000 mg | ORAL_TABLET | Freq: Once | ORAL | Status: AC
  Administered 2018-01-06: 0.3 mg via ORAL
  Filled 2018-01-06: qty 3

## 2018-01-06 MED ORDER — ALUM & MAG HYDROXIDE-SIMETH 200-200-20 MG/5ML PO SUSP: 30.0000 mL | ORAL | Status: DC | PRN

## 2018-01-06 MED ORDER — CHLORHEXIDINE GLUCONATE 4 % EX LIQD: 60.0000 mL | Freq: Once | CUTANEOUS | Status: DC

## 2018-01-06 MED ORDER — HYDROMORPHONE HCL 1 MG/ML IJ SOLN: 0.5000 mg | INTRAMUSCULAR | Status: DC | PRN

## 2018-01-06 MED ORDER — SODIUM CHLORIDE 0.9 % IR SOLN
Status: DC | PRN
  Administered 2018-01-06 (×7): 3000 mL

## 2018-01-06 MED ORDER — POLYETHYLENE GLYCOL 3350 17 G PO PACK: 17.0000 g | PACK | Freq: Every day | ORAL | Status: DC | PRN

## 2018-01-06 MED ORDER — AMLODIPINE BESYLATE 5 MG PO TABS
10.0000 mg | ORAL_TABLET | Freq: Every day | ORAL | Status: DC
  Administered 2018-01-06: 10 mg via ORAL
  Filled 2018-01-06: qty 2

## 2018-01-06 MED ORDER — FENTANYL CITRATE (PF) 250 MCG/5ML IJ SOLN
INTRAMUSCULAR | Status: AC
  Filled 2018-01-06: qty 5

## 2018-01-06 MED ORDER — PROPOFOL 10 MG/ML IV BOLUS
INTRAVENOUS | Status: AC
Start: 2018-01-06 — End: ?
  Filled 2018-01-06: qty 20

## 2018-01-06 MED ORDER — DEXTROSE 5 % IV SOLN
3.0000 g | Freq: Four times a day (QID) | INTRAVENOUS | Status: DC
  Administered 2018-01-06 – 2018-01-07 (×2): 3 g via INTRAVENOUS
  Filled 2018-01-06 (×2): qty 3
  Filled 2018-01-06: qty 3000

## 2018-01-06 MED ORDER — HYDROMORPHONE HCL 2 MG/ML IJ SOLN
INTRAMUSCULAR | Status: AC
  Filled 2018-01-06: qty 1

## 2018-01-06 MED ORDER — MIDAZOLAM HCL 5 MG/5ML IJ SOLN
INTRAMUSCULAR | Status: DC | PRN
  Administered 2018-01-06: 2 mg via INTRAVENOUS

## 2018-01-06 MED ORDER — ONDANSETRON HCL 4 MG PO TABS: 4.0000 mg | ORAL_TABLET | Freq: Four times a day (QID) | ORAL | Status: DC | PRN

## 2018-01-06 MED ORDER — PHENYLEPHRINE HCL 10 MG/ML IJ SOLN
INTRAMUSCULAR | Status: AC
  Filled 2018-01-06: qty 1

## 2018-01-06 MED ORDER — AMOXICILLIN-POT CLAVULANATE 875-125 MG PO TABS
1.0000 | ORAL_TABLET | Freq: Once | ORAL | Status: AC
  Administered 2018-01-06: 1 via ORAL
  Filled 2018-01-06: qty 1

## 2018-01-06 MED ORDER — METOCLOPRAMIDE HCL 5 MG/ML IJ SOLN: 5.0000 mg | Freq: Three times a day (TID) | INTRAMUSCULAR | Status: DC | PRN

## 2018-01-06 MED ORDER — STERILE WATER FOR IRRIGATION IR SOLN
Status: DC | PRN
  Administered 2018-01-06: 100 mL

## 2018-01-06 MED ORDER — ACETAMINOPHEN 500 MG PO TABS
1000.0000 mg | ORAL_TABLET | Freq: Four times a day (QID) | ORAL | Status: DC
  Administered 2018-01-06 – 2018-01-07 (×2): 1000 mg via ORAL
  Filled 2018-01-06 (×2): qty 2

## 2018-01-06 MED ORDER — ACETAMINOPHEN 500 MG PO TABS
1000.0000 mg | ORAL_TABLET | Freq: Once | ORAL | Status: AC
Start: 2018-01-06 — End: 2018-01-06
  Administered 2018-01-06: 1000 mg via ORAL
  Filled 2018-01-06: qty 2

## 2018-01-06 MED ORDER — FENTANYL CITRATE (PF) 100 MCG/2ML IJ SOLN
INTRAMUSCULAR | Status: AC
  Filled 2018-01-06: qty 2

## 2018-01-06 MED ORDER — TETANUS-DIPHTH-ACELL PERTUSSIS 5-2.5-18.5 LF-MCG/0.5 IM SUSP
0.5000 mL | Freq: Once | INTRAMUSCULAR | Status: AC
Start: 2018-01-06 — End: 2018-01-06
  Administered 2018-01-06: 0.5 mL via INTRAMUSCULAR
  Filled 2018-01-06: qty 0.5

## 2018-01-06 MED ORDER — METHOCARBAMOL 500 MG PO TABS
500.0000 mg | ORAL_TABLET | Freq: Four times a day (QID) | ORAL | Status: DC | PRN
  Administered 2018-01-06: 500 mg via ORAL
  Filled 2018-01-06: qty 1

## 2018-01-06 MED ORDER — OXYCODONE HCL 10 MG PO TABS: 10.0000 mg | ORAL_TABLET | Freq: Four times a day (QID) | ORAL | 0 refills | Status: DC | PRN

## 2018-01-06 MED ORDER — ONDANSETRON HCL 4 MG/2ML IJ SOLN: 4.0000 mg | Freq: Four times a day (QID) | INTRAMUSCULAR | Status: DC | PRN

## 2018-01-06 MED ORDER — PHENYLEPHRINE 40 MCG/ML (10ML) SYRINGE FOR IV PUSH (FOR BLOOD PRESSURE SUPPORT)
PREFILLED_SYRINGE | INTRAVENOUS | Status: AC
Start: 2018-01-06 — End: ?
  Filled 2018-01-06: qty 10

## 2018-01-06 MED ORDER — PHENOL 1.4 % MT LIQD: 1.0000 | OROMUCOSAL | Status: DC | PRN

## 2018-01-06 MED ORDER — SUGAMMADEX SODIUM 500 MG/5ML IV SOLN
INTRAVENOUS | Status: DC | PRN
  Administered 2018-01-06: 300 mg via INTRAVENOUS

## 2018-01-06 MED ORDER — EPHEDRINE 5 MG/ML INJ
INTRAVENOUS | Status: AC
  Filled 2018-01-06: qty 10

## 2018-01-06 MED ORDER — HYDROMORPHONE HCL 2 MG PO TABS: 2.0000 mg | ORAL_TABLET | ORAL | 0 refills | Status: DC | PRN

## 2018-01-06 MED ORDER — DEXAMETHASONE SODIUM PHOSPHATE 10 MG/ML IJ SOLN
INTRAMUSCULAR | Status: DC | PRN
  Administered 2018-01-06: 10 mg via INTRAVENOUS

## 2018-01-06 MED ORDER — METOCLOPRAMIDE HCL 5 MG PO TABS: 5.0000 mg | ORAL_TABLET | Freq: Three times a day (TID) | ORAL | Status: DC | PRN

## 2018-01-06 MED ORDER — FENTANYL CITRATE (PF) 250 MCG/5ML IJ SOLN
INTRAMUSCULAR | Status: DC | PRN
  Administered 2018-01-06: 50 ug via INTRAVENOUS
  Administered 2018-01-06: 100 ug via INTRAVENOUS

## 2018-01-06 MED ORDER — OXYCODONE HCL 5 MG PO TABS
10.0000 mg | ORAL_TABLET | ORAL | Status: DC | PRN
  Administered 2018-01-07: 10 mg via ORAL

## 2018-01-06 MED ORDER — ROCURONIUM BROMIDE 10 MG/ML (PF) SYRINGE
PREFILLED_SYRINGE | INTRAVENOUS | Status: AC
  Filled 2018-01-06: qty 5

## 2018-01-06 MED ORDER — ACETAMINOPHEN 325 MG PO TABS: 325.0000 mg | ORAL_TABLET | Freq: Four times a day (QID) | ORAL | Status: DC | PRN

## 2018-01-06 MED ORDER — DIPHENHYDRAMINE HCL 12.5 MG/5ML PO ELIX: 12.5000 mg | ORAL_SOLUTION | ORAL | Status: DC | PRN

## 2018-01-06 MED ORDER — OXYCODONE HCL 5 MG PO TABS
5.0000 mg | ORAL_TABLET | ORAL | Status: DC | PRN
  Administered 2018-01-06 – 2018-01-07 (×2): 10 mg via ORAL
  Filled 2018-01-06 (×3): qty 2

## 2018-01-06 MED ORDER — MENTHOL 3 MG MT LOZG: 1.0000 | LOZENGE | OROMUCOSAL | Status: DC | PRN

## 2018-01-06 SURGICAL SUPPLY — 37 items
BLADE CUTTER GATOR 3.5 (BLADE) ×2 IMPLANT
BNDG COHESIVE 4X5 TAN STRL (GAUZE/BANDAGES/DRESSINGS) ×2 IMPLANT
BUR OVAL 4.0 (BURR) ×2 IMPLANT
CANNULA 5.75X71 LONG (CANNULA) ×2 IMPLANT
CANNULA TWIST IN 8.25X7CM (CANNULA) ×2 IMPLANT
CANNULA TWIST IN 8.25X9CM (CANNULA) IMPLANT
CLSR STERI-STRIP ANTIMIC 1/2X4 (GAUZE/BANDAGES/DRESSINGS) ×2 IMPLANT
DRAPE HALF SHEET 40X57 (DRAPES) ×2 IMPLANT
DRAPE INCISE IOBAN 66X45 STRL (DRAPES) ×4 IMPLANT
DRAPE SHOULDER BEACH CHAIR (DRAPES) ×2 IMPLANT
DRAPE U-SHAPE 47X51 STRL (DRAPES) ×2 IMPLANT
DRSG PAD ABDOMINAL 8X10 ST (GAUZE/BANDAGES/DRESSINGS) ×6 IMPLANT
DURAPREP 26ML APPLICATOR (WOUND CARE) ×2 IMPLANT
GAUZE SPONGE 4X4 12PLY STRL (GAUZE/BANDAGES/DRESSINGS) ×4 IMPLANT
GLOVE BIO SURGEON STRL SZ8 (GLOVE) ×2 IMPLANT
GLOVE BIOGEL PI ORTHO PRO SZ8 (GLOVE) ×2
GLOVE ORTHO TXT STRL SZ7.5 (GLOVE) ×2 IMPLANT
GLOVE PI ORTHO PRO STRL SZ8 (GLOVE) ×2 IMPLANT
GOWN STRL REUS W/ TWL XL LVL3 (GOWN DISPOSABLE) IMPLANT
GOWN STRL REUS W/TWL 2XL LVL3 (GOWN DISPOSABLE) ×2 IMPLANT
GOWN STRL REUS W/TWL XL LVL3 (GOWN DISPOSABLE)
HOVERMATT SINGLE USE (MISCELLANEOUS) ×2 IMPLANT
IMPL SPEEDBRIDGE KIT (Orthopedic Implant) ×1 IMPLANT
IMPLANT SPEEDBRIDGE KIT (Orthopedic Implant) ×2 IMPLANT
KIT BASIN OR (CUSTOM PROCEDURE TRAY) ×2 IMPLANT
PACK ARTHROSCOPY DSU (CUSTOM PROCEDURE TRAY) ×2 IMPLANT
PROBE BIPOLAR ATHRO 135MM 90D (MISCELLANEOUS) ×2 IMPLANT
RESTRAINT HEAD UNIVERSAL NS (MISCELLANEOUS) ×2 IMPLANT
SET ARTHROSCOPY TUBING (MISCELLANEOUS) ×1
SET ARTHROSCOPY TUBING LN (MISCELLANEOUS) ×1 IMPLANT
SLING ARM IMMOBILIZER XL (CAST SUPPLIES) ×2 IMPLANT
SLING ARM XL FOAM STRAP (SOFTGOODS) ×2 IMPLANT
SUPPORT WRAP ARM LG (MISCELLANEOUS) ×2 IMPLANT
SUT MNCRL AB 4-0 PS2 18 (SUTURE) ×2 IMPLANT
TOWEL OR 17X24 6PK STRL BLUE (TOWEL DISPOSABLE) ×2 IMPLANT
TOWEL OR NON WOVEN STRL DISP B (DISPOSABLE) ×2 IMPLANT
WATER STERILE IRR 1000ML POUR (IV SOLUTION) ×2 IMPLANT

## 2018-01-06 NOTE — Plan of Care (Signed)
  Problem: Safety: Goal: Ability to remain free from injury will improve Outcome: Progressing   Problem: Education: Goal: Knowledge of the prescribed therapeutic regimen will improve Outcome: Progressing Goal: Understanding of activity limitations/precautions following surgery will improve Outcome: Progressing   Problem: Activity: Goal: Ability to tolerate increased activity will improve Outcome: Progressing   Problem: Pain Management: Goal: Pain level will decrease with appropriate interventions Outcome: Progressing

## 2018-01-06 NOTE — Progress Notes (Signed)
Orthopedic Tech Progress Note Patient Details:  Chris Powell 04/01/1966 [id number] Patient already has sling. Patient ID: Chris Powell, male   DOB: 08/14/1966, 52 y.o.   MRN: [id number]   Jennye MoccasinHughes, [name] Craig 01/06/2018, 8:23 PM

## 2018-01-06 NOTE — Op Note (Signed)
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  ANESTHESIA:   General with interscalene block  PREOPERATIVE INDICATIONS:  Chris Powell is a  52 y.o. male with a diagnosis of right rotator cuff tear that was somewhat chronic.  This is diagnosed back in 2017, but he elected for nonsurgical management at that time, and then subsequent to disabling pain elected for surgical management in 2019.  Who failed conservative measures and elected for surgical management.    The risks benefits and alternatives were discussed with the patient preoperatively including but not limited to the risks of infection, bleeding, nerve injury, cardiopulmonary complications, the need for revision surgery, among others, and the patient was willing to proceed.  We also discussed the risks for recurrent rotator cuff tear, incomplete relief of symptoms, among others.  ESTIMATED BLOOD LOSS: 75 mL  OPERATIVE IMPLANTS: Arthrex bio composite 4.75 mm swivel lock x4 and a speed bridge configuration double row  OPERATIVE FINDINGS: Shoulder had 0 to 160 degrees of forward flexion.  There was no adhesive capsulitis, but he does have chronic stiffness.  The glenohumeral articular cartilage is in good condition.  The subacromial space had a small osseous acromiale which was removed at the anterior edge of the acromion.  I did not violate the AC joint.  There was slight subacromial spurring.  Subscapularis is intact, the biceps tendon had substantial fraying and was released.   The rotator cuff had a 2 x 2 centimeter full-thickness supraspinatus tear and a crescent formation.  Bone quality was good.  Tendon quality was fair.  OPERATIVE PROCEDURE: Patient was brought to the operating room and placed in the supine position.  General anesthesia was administered.  IV antibiotics were given in the form of 3 g of intravenous Ancef.  The right upper extremity was positioned in a beachchair position, his body habitus made this extremely difficult.  The right upper extremity was prepped and draped in usual sterile fashion.  Timeout performed.  Diagnostic arthroscopy carried out the above-named findings.  The arthroscopic shaver was used to debride the debris within the joint, as well as the upper border of the subscapularis.  The biceps tendon was released with a basket.  The glenoid tubercle smoothened with a shaver.  The cuff debrided from below.  I went to the subacromial space.  Access is actually fairly difficult as the rotator cuff was somewhat adherent to the subdeltoid fascia, and ultimately I was able to mobilize this adequate access.  I debrided the cuff from above, performed the CA ligament release, removed a loose piece of the acromion with a pituitary which was part of the os.  I completed the acromioplasty with a bur.  Viewing from the lateral portal I placed 2 medial anchors percutaneously at the articular margin.  I then passed the sutures through the tendon going from front to back, and then secured them laterally with bio composite corkscrew anchors.  Excellent fixation was achieved and the cuff was directly adherent and opposed to the tuberosity.  I did prepare the tuberosity with a bur prior to placement of the anchors  as well.  I touched up the acromioplasty viewing from the lateral portal, access overall to his shoulder was extremely challenging given his body habitus.  The length of the camera was almost not long enough viewing from the posterior portal.  He had  radiographic findings of AC joint degenerative changes on MRI although during his clinical exam did not demonstrate significant pain there, and did not have substantial spurring underneath the [facility] joint from the subacromial space.  Therefore I did not violate or do anything to the [facility] joint.  The patient was awakened and returned to the PACU in stable and satisfactory condition.  The portals were closed with Monocryl and Steri-Strips placed.

## 2018-01-06 NOTE — Anesthesia Procedure Notes (Signed)
Anesthesia Regional Block: Interscalene brachial plexus block   Pre-Anesthetic Checklist: ,, timeout performed, Correct Patient, Correct Site, Correct Laterality, Correct Procedure, Correct Position, site marked, Risks and benefits discussed,  Surgical consent,  Pre-op evaluation,  At surgeon's request and post-op pain management  Laterality: Right  Prep: chloraprep       Needles:  Injection technique: Single-shot  Needle Type: Echogenic Stimulator Needle     Needle Length: 5cm  Needle Gauge: 21     Additional Needles:   Procedures:, nerve stimulator,,,,,,,   Nerve Stimulator or Paresthesia:  Response: 0.4 mA,   Additional Responses:   Narrative:  Start time: 01/06/2018 12:25 PM End time: 01/06/2018 12:35 PM Injection made incrementally with aspirations every 5 mL.  Additional Notes: Monitors applied. Patient sedated. Sterile prep and drape,hand hygiene and sterile gloves were used. Relevant anatomy identified.Needle position confirmed.Local anesthetic injected incrementally after negative aspiration. Local anesthetic spread visualized around nerve(s). Vascular puncture avoided. No complications. Image printed for medical record.The patient tolerated the procedure well.

## 2018-01-06 NOTE — H&P (Signed)
PREOPERATIVE H&P  Chief Complaint: Right shoulder pain  HPI: Chris Powell is a 52 y.o. male who presents for preoperative history and physical with a diagnosis of right shoulder rotator cuff tear. Symptoms are rated as moderate to severe, and have been worsening.  This is significantly impairing activities of daily living.  He has elected for surgical management.  This was diagnosed back in 2017, although if he never followed through with surgical scheduling, but at this point has lost significant function and wishes to proceed with surgical intervention.  Past Medical History:  Diagnosis Date  . Anemia   . Arthritis   . Asthma   . COPD (chronic obstructive pulmonary disease) (HCC)   . Coronary artery disease   . Depression   . Diabetes (HCC)   . GERD (gastroesophageal reflux disease)   . Hyperlipidemia   . Hypertension   . Myocardial infarction (HCC)   . Pneumonia    HX OF PNA  . Shortness of breath dyspnea   . Sleep apnea    USES CPAP   Past Surgical History:  Procedure Laterality Date  . CARDIAC CATHETERIZATION  12/22/2014  . CORONARY ANGIOPLASTY    . CORONARY STENT PLACEMENT  12/22/2014   LAD  . KNEE SURGERY    . LEFT HEART CATH AND CORONARY ANGIOGRAPHY N/A 12/16/2017   Procedure: LEFT HEART CATH AND CORONARY ANGIOGRAPHY;  Surgeon: Swaziland, Peter M, MD;  Location: [facility] INVASIVE CV LAB;  Service: Cardiovascular;  Laterality: N/A;  . LEFT HEART CATHETERIZATION WITH CORONARY ANGIOGRAM N/A 12/22/2014   Procedure: LEFT HEART CATHETERIZATION WITH CORONARY ANGIOGRAM;  Surgeon: Corky Crafts, MD;  Location: [facility] CATH LAB;  Service: Cardiovascular;  Laterality: N/A;   Social History   Socioeconomic History  . Marital status: Legally Separated    Spouse name: Not on file  . Number of children: Not on file  . Years of education: Not on file  . Highest education level: Not on file  Occupational History  . Not on file  Social Needs  . Financial resource strain: Not on file   . Food insecurity:    Worry: Not on file    Inability: Not on file  . Transportation needs:    Medical: Not on file    Non-medical: Not on file  Tobacco Use  . Smoking status: Current Every Day Smoker    Packs/day: 0.50    Years: 30.00    Pack years: 15.00    Types: Cigarettes  . Smokeless tobacco: Never Used  . Tobacco comment: " I have  tried vapor cigarettes"  Substance and Sexual Activity  . Alcohol use: Not Currently    Alcohol/week: 0.0 oz    Comment: none now but previous  . Drug use: Yes    Types: Cocaine    Comment: Denies any recent use.  Pt has a Pain Contract. last use was 2016  . Sexual activity: Not on file  Lifestyle  . Physical activity:    Days per week: Not on file    Minutes per session: Not on file  . Stress: Not on file  Relationships  . Social connections:    Talks on phone: Not on file    Gets together: Not on file    Attends religious service: Not on file    Active member of club or organization: Not on file    Attends meetings of clubs or organizations: Not on file    Relationship status: Not on file  Other Topics Concern  .  Not on file  Social History Narrative  . Not on file   Family History  Problem Relation Age of Onset  . Heart attack Father   . Diabetes Father   . Cancer Mother   . Hypertension Mother   . Hyperlipidemia Mother   . Cancer Sister    No Known Allergies Prior to Admission medications   Medication Sig Start Date End Date Taking? Authorizing Provider  albuterol (PROVENTIL HFA;VENTOLIN HFA) 108 (90 Base) MCG/ACT inhaler Inhale 2 puffs into the lungs every 6 (six) hours as needed for wheezing or shortness of breath.   Yes [provider]  amLODipine (NORVASC) 10 MG tablet Take 1 tablet (10 mg total) by mouth daily. 09/12/15  Yes Hawks, Christy A, FNP  aspirin EC 81 MG tablet Take 81 mg by mouth daily.   Yes [provider]  atorvastatin (LIPITOR) 80 MG tablet Take 1 tablet (80 mg total) by mouth daily  at 6 PM. 12/23/14  Yes Kilroy, Eda Paschal, PA-C  Cholecalciferol 2000 UNITS CAPS Take 2,000 Units by mouth daily.    Yes [provider]  cloNIDine (CATAPRES) 0.3 MG tablet Take 0.3 mg by mouth 2 (two) times daily.    Yes [provider]  furosemide (LASIX) 40 MG tablet Take 1 tablet (40 mg total) by mouth daily. 02/28/15  Yes Hawks, Christy A, FNP  gabapentin (NEURONTIN) 300 MG capsule Take 1 capsule (300 mg total) by mouth 3 (three) times daily. 04/01/16  Yes Elenora Gamma, MD  hydrALAZINE (APRESOLINE) 50 MG tablet TAKE 1 TABLET (50 MG TOTAL) BY MOUTH 3 (THREE) TIMES DAILY. 08/12/16  Yes Nahser, Deloris Ping, MD  lisinopril (PRINIVIL,ZESTRIL) 40 MG tablet Take 1 tablet (40 mg total) by mouth daily. 11/22/15  Yes Nahser, Deloris Ping, MD  metFORMIN (GLUCOPHAGE XR) 500 MG 24 hr tablet Take 1 tablet (500 mg total) by mouth daily with breakfast. 12/19/17 12/19/18 Yes Swazil[place], Peter M, MD  mometasone (NASONEX) 50 MCG/ACT nasal spray Place 2 sprays into the nose daily as needed (for allergies).    Yes [provider]  nitroGLYCERIN (NITROSTAT) 0.4 MG SL tablet Place 1 tablet (0.4 mg total) under the tongue every 5 (five) minutes as needed for chest pain. 01/27/15  Yes Kilroy, Luke K, PA-C  omeprazole (PRILOSEC) 20 MG capsule Take 20 mg by mouth daily.   Yes [provider]  Oxycodone HCl 10 MG TABS Take 10 mg by mouth 3 (three) times daily as needed (for pain).    Yes [provider]  potassium chloride SA (K-DUR,KLOR-CON) 20 MEQ tablet Take 20 mEq by mouth daily.   Yes [provider]  sildenafil (REVATIO) 20 MG tablet TAKE 2-5 TABLETS AS NEEDED PRIOR TO SEXUAL ACTIVITY 12/25/17  Yes Nahser, Deloris Ping, MD  spironolactone (ALDACTONE) 50 MG tablet Take 1 tablet (50 mg total) by mouth daily. 12/01/17  Yes Elenora Gamma, MD  amoxicillin-clavulanate (AUGMENTIN) 875-125 MG tablet Take 1 tablet by mouth 2 (two) times daily. One po bid x 7 days 01/06/18   Palumbo, April, MD   carvedilol (COREG) 25 MG tablet Take 1 tablet (25 mg total) by mouth 2 (two) times daily. Patient not taking: Reported on 12/11/2017 04/10/16   Nahser, Deloris Ping, MD  cyclobenzaprine (FLEXERIL) 10 MG tablet Take 1 tablet (10 mg total) by mouth 3 (three) times daily as needed for muscle spasms. Patient not taking: Reported on 12/11/2017 09/12/15   Junie Spencer, FNP  ezetimibe (ZETIA) 10 MG tablet  Take 1 tablet (10 mg total) by mouth daily. Patient not taking: Reported on 12/11/2017 09/06/16   Elenora GammaBradshaw, Samuel L, MD  fluticasone [facility](FLONASE) 50 MCG/ACT nasal spray Place 2 sprays into both nostrils daily. Patient not taking: Reported on 12/11/2017 03/11/16   Jannifer RodneyHawks, Christy A, FNP  ibuprofen (ADVIL,MOTRIN) 800 MG tablet TAKE 1 TABLET (800 MG TOTAL) BY MOUTH EVERY 8 (EIGHT) HOURS AS NEEDED. Patient taking differently: TAKE 1 TABLET (800 MG TOTAL) BY MOUTH EVERY 8 (EIGHT) HOURS AS NEEDED FOR PAIN OR HEADACHE 11/08/16   Hecox, Wilford CornerKerri A, MD  naloxone (NARCAN) nasal spray 4 mg/0.1 mL Place 1 spray into the nose as directed.    [provider]  tiotropium (SPIRIVA) 18 MCG inhalation capsule Place 1 capsule (18 mcg total) into inhaler and inhale daily. Patient not taking: Reported on 12/11/2017 05/05/17   Jannifer RodneyHawks, Christy A, FNP     Positive ROS: All other systems have been reviewed and were otherwise negative with the exception of those mentioned in the HPI and as above.  Physical Exam:  Body mass index is 52.94 kg/m.  General: Alert, no acute distress Cardiovascular: No pedal edema Respiratory: No cyanosis, no use of accessory musculature GI: No organomegaly, abdomen is soft and non-tender Skin: No lesions in the area of chief complaint Neurologic: Sensation intact distally Psychiatric: Patient is competent for consent with normal mood and affect Lymphatic: No axillary or cervical lymphadenopathy  MUSCULOSKELETAL: Right shoulder active motion 0-90 degrees external rotation to 10 degrees currently  not having significant AC joint pain positive drop arm sign and weakness with cuff testing.  Assessment: Right chronic rotator cuff tear with impingement syndrome.   Plan: Plan for Procedure(s): RIGHT SHOULDER ARTHROSCOPY  DEBRIDEMENT, ACROMIOPLASTY, ROTATOR CUFF REPAIR partial, versus complete depending on the amount of tissue rehab to work with.  The risks benefits and alternatives were discussed with the patient including but not limited to the risks of nonoperative treatment, versus surgical intervention including infection, bleeding, nerve injury,  blood clots, cardiopulmonary complications, morbidity, mortality, recurrent rotator cuff tear, incomplete relief of pain, among others, and they were willing to proceed.     Chris PostJoshua P Milayna Rotenberg, MD Cell [phone]   01/06/2018 1:05 PM

## 2018-01-06 NOTE — Anesthesia Preprocedure Evaluation (Addendum)
Anesthesia Evaluation  Patient identified by MRN, date of birth, ID band Patient awake    Reviewed: Allergy & Precautions, NPO status , Patient's Chart, lab work & pertinent test results  Airway Mallampati: II  TM Distance: >3 FB Neck ROM: Full    Dental   Pulmonary asthma , sleep apnea , COPD, Current Smoker,    Pulmonary exam normal        Cardiovascular hypertension, Pt. on medications + CAD and + Cardiac Stents  Normal cardiovascular exam     Neuro/Psych    GI/Hepatic GERD  Medicated and Controlled,  Endo/Other  diabetes, Type 2, Oral Hypoglycemic Agents  Renal/GU      Musculoskeletal   Abdominal   Peds  Hematology   Anesthesia Other Findings   Reproductive/Obstetrics                             Anesthesia Physical Anesthesia Plan  ASA: III  Anesthesia Plan: General   Post-op Pain Management:  Regional for Post-op pain   Induction:   PONV Risk Score and Plan: 1 and Ondansetron and Treatment may vary due to age or medical condition  Airway Management Planned: Oral ETT  Additional Equipment:   Intra-op Plan:   Post-operative Plan: Extubation in OR  Informed Consent: I have reviewed the patients History and Physical, chart, labs and discussed the procedure including the risks, benefits and alternatives for the proposed anesthesia with the patient or authorized representative who has indicated his/her understanding and acceptance.     Plan Discussed with: CRNA and Surgeon  Anesthesia Plan Comments:         Anesthesia Quick Evaluation
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
## 2018-01-06 NOTE — Anesthesia Postprocedure Evaluation (Signed)
Anesthesia Post Note  Patient: Lloyd HugerMaurice D Mroz  Procedure(s) Performed: RIGHT SHOULDER ARTHROSCOPY  DEBRIDEMENT,ACROMIOPLASTY, ROTATOR CUFF REPAIR (Right Shoulder)     Patient location during evaluation: PACU Anesthesia Type: General Level of consciousness: sedated Pain management: pain level controlled Vital Signs Assessment: post-procedure vital signs reviewed and stable Respiratory status: spontaneous breathing and respiratory function stable Cardiovascular status: stable Postop Assessment: no apparent nausea or vomiting Anesthetic complications: no    Last Vitals:  Vitals:   01/06/18 1815 01/06/18 1842  BP: (!) 153/71 (!) 199/99  Pulse: 80 81  Resp: (!) 25 (!) 21  Temp: 37.1 C 37.1 C  SpO2: 93% 96%    Last Pain:  Vitals:   01/06/18 1842  TempSrc: Oral  PainSc:                  Airiel Oblinger DANIEL

## 2018-01-06 NOTE — Transfer of Care (Signed)
Immediate Anesthesia Transfer of Care Note  Patient: Chris Powell  Procedure(s) Performed: RIGHT SHOULDER ARTHROSCOPY  DEBRIDEMENT,ACROMIOPLASTY, ROTATOR CUFF REPAIR (Right Shoulder)  Patient Location: PACU  Anesthesia Type:GA combined with regional for post-op pain  Level of Consciousness: awake  Airway & Oxygen Therapy: Patient Spontanous Breathing and Patient connected to nasal cannula oxygen  Post-op Assessment: Report given to RN and Post -op Vital signs reviewed and stable  Post vital signs: Reviewed and stable  Last Vitals:  Vitals Value Taken Time  BP 181/98 01/06/2018  5:00 PM  Temp    Pulse 65 01/06/2018  5:01 PM  Resp 23 01/06/2018  5:01 PM  SpO2 90 % 01/06/2018  5:01 PM  Vitals shown include unvalidated device data.  Last Pain:  Vitals:   01/06/18 1030  TempSrc:   PainSc: 8       Patients Stated Pain Goal: 3 (01/06/18 1030)  Complications: No apparent anesthesia complications

## 2018-01-06 NOTE — ED Notes (Signed)
E-signature not available, pt verbalized understanding of DC instructions and prescriptions 

## 2018-01-06 NOTE — Anesthesia Procedure Notes (Signed)
Procedure Name: Intubation Date/Time: 01/06/2018 2:09 PM Performed by: Freddie Breech, CRNA Pre-anesthesia Checklist: Patient identified, Emergency Drugs available, Suction available and Patient being monitored Patient Re-evaluated:Patient Re-evaluated prior to induction Oxygen Delivery Method: Circle System Utilized Preoxygenation: Pre-oxygenation with 100% oxygen Induction Type: IV induction Ventilation: Mask ventilation without difficulty Laryngoscope Size: Mac and 4 Grade View: Grade II Tube type: Oral Tube size: 7.5 mm Number of attempts: 1 Airway Equipment and Method: Stylet and Oral airway Placement Confirmation: ETT inserted through vocal cords under direct vision,  positive ETCO2 and breath sounds checked- equal and bilateral Secured at: 23 cm Tube secured with: Tape Dental Injury: Teeth and Oropharynx as per pre-operative assessment

## 2018-01-06 NOTE — Progress Notes (Signed)
Pt's blood pressure reading has been consistently elevated in spite of the Hydralazine 20 mg given in PACU around 1730.  BP around 2000 was 204/95 via Dynamap and pulse rate 90. He stated he had not taken his BP medicines for the last 2 days. Assigned RN paged and notified on-call for Dr. Dion SaucierLandau. PA on call, K. Shepperson, answered the paged and ordered Norvasc 10 mg tab with Clonidine 0.3 mg tabs to treat the hypertension. Around 2140, the ordered medications were administered to patient by RN. Will continue to monitor pt's blood pressure through the shift.

## 2018-01-07 ENCOUNTER — Encounter (HOSPITAL_COMMUNITY): Payer: Self-pay | Admitting: Orthopedic Surgery

## 2018-01-07 LAB — GLUCOSE, CAPILLARY: GLUCOSE-CAPILLARY: 162 mg/dL — AB (ref 65–99)
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
## 2018-01-07 NOTE — Progress Notes (Signed)
Pt doing well. Pt given D/C instructions with Rx's, verbal understanding was provided. Pt's incision is clean and dry with no sign of infection. Pt's IV was removed prior to D/C. Pt D/C'd home via wheelchair per MD order. Pt is stable @ D/C and has no other needs at this time. Cameo Shewell, RN  °

## 2018-01-07 NOTE — Evaluation (Addendum)
Occupational Therapy Evaluation Patient Details Name: Chris Powell MRN: [id number] DOB: 07/26/1966 Today's Date: 01/07/2018    History of Present Illness Pt is a 52 y/o male now s/p Right shoulder arthroscopy with extensive debridement, biceps tendon release, acromioplasty, rotator cuff repair on 01/06/18. PMHx includes HTN, MI, COPD, CAD, DM   Clinical Impression   This 52 y/o male presents with the above. At baseline pt is independent with ADLs and functional mobility. Pt completing functional mobility without AD with minguard assist this session; currently requires MaxA for UB and LB ADLs secondary to RUE functional limitations. Educated pt on shoulder precautions, safety and compensatory techniques for completing ADLs while adhering to precautions with pt verbalizing and return demonstrating understanding. Pt reports he will return home with brother and sister-in-law who are able to assist with ADLs PRN. Questions answered throughout. Feel pt is safe to return home with available family assist once medically ready and follow up as ordered by MD. No further acute OT needs identified at this time. Will sign off.    Follow Up Recommendations  Follow surgeon's recommendation for DC plan and follow-up therapies;Supervision/Assistance - 24 hour    Equipment Recommendations  Other (comment)(pt declining 3:1 )           Precautions / Restrictions Precautions Precautions: Shoulder Type of Shoulder Precautions: NWB operated UE; AROM e/w/h to tolerance; no A/PROM to shoulder Shoulder Interventions: Shoulder sling/immobilizer;At all times;Off for dressing/bathing/exercises Precaution Booklet Issued: Yes (comment) Precaution Comments: issued and reviewed with pt  Required Braces or Orthoses: Sling Restrictions Weight Bearing Restrictions: Yes RUE Weight Bearing: Non weight bearing      Mobility Bed Mobility Overal bed mobility: Needs Assistance Bed Mobility: Supine to Sit     Supine  to sit: HOB elevated;Min guard     General bed mobility comments: MinGuard for safety; discussed strategies for completing supine>sit at home with assist to ensure NWB in RUE   Transfers Overall transfer level: Needs assistance Equipment used: None Transfers: Sit to/from Stand Sit to Stand: Min guard         General transfer comment: MinGuard for safety, no physical assist needed     Balance Overall balance assessment: Mild deficits observed, not formally tested                                         ADL either performed or assessed with clinical judgement   ADL Overall ADL's : Needs assistance/impaired Eating/Feeding: Set up;Sitting   Grooming: Set up;Sitting   Upper Body Bathing: Minimal assistance;Sitting   Lower Body Bathing: Moderate assistance;Sit to/from stand   Upper Body Dressing : Maximal assistance;Sitting   Lower Body Dressing: Maximal assistance;Sit to/from stand   Toilet Transfer: Min guard;Ambulation;Regular Toilet   Toileting- Clothing Manipulation and Hygiene: Minimal assistance;Sit to/from Nurse, children'sstand     Tub/Shower Transfer Details (indicate cue type and reason): educated on option of 3:1 as shower chair for increased safety during tsk completion  Functional mobility during ADLs: Min guard General ADL Comments: educated on shoulder precautions, safety and compensatory techniques for completing ADLs while adhering to precautions; pt reports he will have assist to complete ADLs at home      Pertinent Vitals/Pain Pain Assessment: Faces Faces Pain Scale: Hurts little more Pain Location: R shoulder  Pain Descriptors / Indicators: Guarding;Sore Pain Intervention(s): Limited activity within patient's tolerance;Monitored during session;Repositioned     Hand Dominance  Right   Extremity/Trunk Assessment Upper Extremity Assessment Upper Extremity Assessment: RUE deficits/detail RUE Deficits / Details: s/p R shoulder surgery  RUE: Unable  to fully assess due to pain;Unable to fully assess due to immobilization   Lower Extremity Assessment Lower Extremity Assessment: Overall WFL for tasks assessed       Communication Communication Communication: No difficulties   Cognition Arousal/Alertness: Awake/alert Behavior During Therapy: WFL for tasks assessed/performed Overall Cognitive Status: Within Functional Limits for tasks assessed                                           Exercises Shoulder Exercises Elbow Flexion: AROM;10 reps;Right;Seated Elbow Extension: AROM;10 reps;Right;Seated Wrist Flexion: AROM;10 reps;Right;Seated Wrist Extension: AROM;10 reps;Right;Seated Digit Composite Flexion: AROM;10 reps;Right;Seated Composite Extension: AROM;10 reps;Right;Seated Neck Flexion: AROM;Seated Neck Extension: AROM;Seated Neck Lateral Flexion - Right: Seated;AROM Neck Lateral Flexion - Left: Seated;AROM Hand Exercises Forearm Supination: AROM;10 reps;Seated;Right Forearm Pronation: AROM;10 reps;Seated;Right   Shoulder Instructions Shoulder Instructions Donning/doffing shirt without moving shoulder: Maximal assistance;Patient able to independently direct caregiver Method for sponge bathing under operated UE: Minimal assistance;Patient able to independently direct caregiver Donning/doffing sling/immobilizer: Moderate assistance;Patient able to independently direct caregiver Correct positioning of sling/immobilizer: Min-guard;Patient able to independently direct caregiver ROM for elbow, wrist and digits of operated UE: Min-guard Sling wearing schedule (on at all times/off for ADL's): Independent Proper positioning of operated UE when showering: Min-guard Positioning of UE while sleeping: Minimal assistance;Patient able to independently direct caregiver    Home Living Family/patient expects to be discharged to:: Private residence Living Arrangements: Other relatives(brother and sister-in-law ) Available  Help at Discharge: Family;Available 24 hours/day Type of Home: House Home Access: Stairs to enter [employer] of Steps: 5 Entrance Stairs-Rails: Right;Left;Can reach both Home Layout: One level     Bathroom Shower/Tub: Chief Strategy Officer: Standard     Home Equipment: None          Prior Functioning/Environment Level of Independence: Independent                 OT Problem List: Decreased range of motion;Decreased knowledge of precautions;Impaired UE functional use            OT Goals(Current goals can be found in the care plan section) Acute Rehab OT Goals Patient Stated Goal: return home  OT Goal Formulation: All assessment and education complete, DC therapy                                 AM-PAC PT "6 Clicks" Daily Activity     Outcome Measure Help from another person eating meals?: None Help from another person taking care of personal grooming?: A Little Help from another person toileting, which includes using toliet, bedpan, or urinal?: A Little Help from another person bathing (including washing, rinsing, drying)?: A Little Help from another person to put on and taking off regular upper body clothing?: A Lot Help from another person to put on and taking off regular lower body clothing?: A Lot 6 Click Score: 17   End of Session Equipment Utilized During Treatment: Other (comment)(sling ) Nurse Communication: Mobility status  Activity Tolerance: Patient tolerated treatment well Patient left: in chair;with call bell/phone within reach  OT Visit Diagnosis: Pain;Other (comment)(s/p R shoulder surgery ) Pain - Right/Left: Right Pain - part of  body: Shoulder                Time: 1610-9604 OT Time Calculation (min): 33 min Charges:  OT General Charges $OT Visit: 1 Visit OT Evaluation $OT Eval Moderate Complexity: 1 Mod G-Codes:     Marcy Siren, OT Pager [phone] 01/07/2018   Orlando Penner 01/07/2018,  9:52 AM

## 2018-01-15 ENCOUNTER — Telehealth: Payer: Self-pay | Admitting: Family Medicine

## 2018-01-19 NOTE — Telephone Encounter (Signed)
Patient seen cardiology and they prescribed the sildenafil for him. Patient had refills under cardiologist.

## 2018-01-19 NOTE — Telephone Encounter (Signed)
Viagra was declined due to heart disease. Needs to discuss with cardiology, likely needs follow up with them to discuss.   This is a risky medication considering that he is also prescribed nitroglycerin. I am leaving the practice and I am not prepared to ask the provider following me in 3 months to take this risk.   Murtis Sink, MD [facility] 01/19/2018, 7:44 AM

## 2018-02-19 ENCOUNTER — Encounter: Payer: Self-pay | Admitting: Internal Medicine

## 2018-02-27 ENCOUNTER — Other Ambulatory Visit: Payer: Self-pay | Admitting: Family

## 2018-03-02 ENCOUNTER — Ambulatory Visit: Payer: Medicaid Other | Admitting: Physical Therapy

## 2018-03-04 ENCOUNTER — Other Ambulatory Visit: Payer: Self-pay

## 2018-03-04 ENCOUNTER — Ambulatory Visit: Payer: Medicaid Other | Attending: Orthopedic Surgery | Admitting: Physical Therapy

## 2018-03-04 DIAGNOSIS — M25611 Stiffness of right shoulder, not elsewhere classified: Secondary | ICD-10-CM | POA: Diagnosis present

## 2018-03-04 DIAGNOSIS — M25511 Pain in right shoulder: Secondary | ICD-10-CM | POA: Diagnosis present

## 2018-03-04 NOTE — Therapy (Signed)
[facility] [address] [place], [place], [address] Phone: [phone]   Fax:  [phone]  Physical Therapy Evaluation  Patient Details  Name: Chris Powell MRN: [id number] Date of Birth: [birth year] Referring Provider: Teryl Lucy MD.   Encounter Date: 03/04/2018  PT End of Session - 03/04/18 1219    Visit Number  1    Number of Visits  16    Date for PT Re-Evaluation  05/06/18    PT Start Time  1113    PT Stop Time  1137    PT Time Calculation (min)  24 min    Activity Tolerance  Patient tolerated treatment well    Behavior During Therapy  [facility] for tasks assessed/performed       Past Medical History:  Diagnosis Date  . Anemia   . Arthritis   . Asthma   . COPD (chronic obstructive pulmonary disease) (HCC)   . Coronary artery disease   . Depression   . Diabetes (HCC)   . GERD (gastroesophageal reflux disease)   . Hyperlipidemia   . Hypertension   . Myocardial infarction (HCC)   . Pneumonia    HX OF PNA  . Right rotator cuff tear 01/06/2018  . Shortness of breath dyspnea   . Sleep apnea    USES CPAP    Past Surgical History:  Procedure Laterality Date  . CARDIAC CATHETERIZATION  12/22/2014  . CORONARY ANGIOPLASTY    . CORONARY STENT PLACEMENT  12/22/2014   LAD  . KNEE SURGERY    . LEFT HEART CATH AND CORONARY ANGIOGRAPHY N/A 12/16/2017   Procedure: LEFT HEART CATH AND CORONARY ANGIOGRAPHY;  Surgeon: Swaziland, Peter M, MD;  Location: [facility] INVASIVE CV LAB;  Service: Cardiovascular;  Laterality: N/A;  . LEFT HEART CATHETERIZATION WITH CORONARY ANGIOGRAM N/A 12/22/2014   Procedure: LEFT HEART CATHETERIZATION WITH CORONARY ANGIOGRAM;  Surgeon: Corky Crafts, MD;  Location: [facility] CATH LAB;  Service: Cardiovascular;  Laterality: N/A;  . SHOULDER ARTHROSCOPY WITH ROTATOR CUFF REPAIR AND SUBACROMIAL DECOMPRESSION Right 01/06/2018   Procedure: RIGHT SHOULDER ARTHROSCOPY  DEBRIDEMENT,ACROMIOPLASTY, ROTATOR CUFF REPAIR;   Surgeon: Teryl Lucy, MD;  Location: [facility] OR;  Service: Orthopedics;  Laterality: Right;    There were no vitals filed for this visit.   Subjective Assessment - 03/04/18 1227    Subjective  The patient fell injuring his right shoulder.  He underwent a right shoulder RTC repair on 01/06/18.  He is without sling today.  He reports severe pain today.  Medication decreases his pain.  He reports no specific HEP but states he has been doing things on his own.  Told him we would progress him slowly per protocol.    Pertinent History  COPD.    Patient Stated Goals  Use right arm again without pain.    Currently in Pain?  Yes    Pain Score  10-Worst pain ever    Pain Location  Shoulder    Pain Orientation  Right    Pain Descriptors / Indicators  Aching;Throbbing    Pain Type  Acute pain    Pain Onset  More than a month ago    Pain Frequency  Constant    Aggravating Factors   See above.    Pain Relieving Factors  See above.         [facility] PT Assessment - 03/04/18 0001      Assessment   Medical Diagnosis  Right RTC repair.    Referring Provider  Ivin Booty  Dion SaucierLandau MD.    Onset Date/Surgical Date  -- April 2019.      Precautions   Precautions  -- Per protocol.      Restrictions   Weight Bearing Restrictions  No      Home Environment   Living Environment  Private residence      Prior Function   Level of Independence  Independent      Observation/Other Assessments   Observations  Right shoulder incisional sites look to be healing well.      Posture/Postural Control   Posture/Postural Control  Postural limitations    Postural Limitations  Rounded Shoulders      ROM / Strength   AROM / PROM / Strength  PROM      PROM   Overall PROM Comments  Right shoulder passive flexion to 95 degrees; ER to 35 degrees and IR to abdomen.      Palpation   Palpation comment  Tender to palpation at right acromial ridge.      Ambulation/Gait   Gait Comments  WNL.                 Objective measurements completed on examination: See above findings.                   PT Long Term Goals - 03/04/18 1242      PT LONG TERM GOAL #1   Title  Independent with a HEP.    Baseline  No knowledge of appropriate ther ex.    Time  8    Period  Weeks    Status  New      PT LONG TERM GOAL #2   Title  Active right shoulder flexion to 145 degrees so the patient can easily reach overhead.    Baseline  Passive right shoulder flexion= 95 degrees.    Time  8    Period  Weeks    Status  New      PT LONG TERM GOAL #3   Title  Active ER to 70 degrees+ to allow for easily donning/doffing of apparel.    Baseline  ER= 35 degrees.    Time  8    Period  Weeks    Status  New      PT LONG TERM GOAL #4   Title  Increase right shoulder strength to a solid 4+/5 to increase stability for performance of functional activities.    Baseline  Strength not tested due to recent rotator cuff surgery.    Time  8    Period  Weeks    Status  New      PT LONG TERM GOAL #5   Title  Perform ADL's with pain not > 2-3/10.    Baseline  Patient unable to use right UE for ADL performance.    Time  8    Period  Weeks    Status  New             Plan - 03/04/18 1237    Clinical Impression Statement  The patient presents to OPPT s/p right RTC repair performed on 01/06/18.  He continues to report severe pain.  He has a loss of PROM has expected and essentially no functional use of his right upper extremity.  He has palpable pain along his right acromial ridge.  Patient will benefit from skilled physical therapy intervention to address pain and deficits per progression into rotator cuff protocol.       History  and Personal Factors relevant to plan of care:  COPD.    Clinical Presentation  Stable    Clinical Presentation due to:  Good surgical outcome.    Clinical Decision Making  Low    Rehab Potential  Excellent    PT Frequency  2x / week    PT Duration  8  weeks    PT Treatment/Interventions  ADLs/Self Care Home Management;Cryotherapy;Electrical Stimulation;Ultrasound;Moist Heat;Therapeutic activities;Therapeutic exercise;Neuromuscular re-education;Patient/family education;Passive range of motion;Manual techniques;Vasopneumatic Device    PT Next Visit Plan  Begin with PROM to patient's right shoulder with progression to [facility] an dto pulleys and UE ranger then to isometrics and AROM.  Eventual progression to strengthening exercises.  Modalites as needed.    Consulted and Agree with Plan of Care  Patient       Patient will benefit from skilled therapeutic intervention in order to improve the following deficits and impairments:  Decreased activity tolerance, Pain, Decreased range of motion  Visit Diagnosis: Acute pain of right shoulder - Plan: PT plan of care cert/re-cert  Stiffness of right shoulder, not elsewhere classified - Plan: PT plan of care cert/re-cert     Problem List Patient Active Problem List   Diagnosis Date Noted  . Right rotator cuff tear 01/06/2018  . Angina pectoris (HCC) 12/16/2017  . Diabetes mellitus without complication (HCC) 11/27/2017  . Erectile dysfunction 09/05/2016  . Chronic pain syndrome 01/11/2016  . AC (acromioclavicular) arthritis 09/06/2015  . Sprain of right shoulder 09/06/2015  . Metabolic syndrome 08/29/2015  . Abdominal wall pain 08/15/2015  . Disorder of ligament of right wrist 08/09/2015  . Ganglion of right wrist 08/09/2015  . Primary osteoarthritis of right hand 08/09/2015  . Right wrist pain 07/13/2015  . GERD (gastroesophageal reflux disease) 02/28/2015  . Vitamin D deficiency 02/28/2015  . Hyperlipidemia 02/28/2015  . Chronic knee pain 02/28/2015  . Cocaine abuse (HCC) 01/25/2015  . Hx of non-ST elevation myocardial infarction (NSTEMI) 01/24/2015  . CAD - S/P LAD DES 3/11/22/14 12/23/2014  . Morbid obesity-BMI 51 12/23/2014  . COPD (chronic obstructive pulmonary disease) (HCC) 12/23/2014   . HTN (hypertension) 12/23/2014  . Smoking 12/23/2014  . Non-sustained ventricular tachycardia (HCC) 12/23/2014  . Hypokalemia 12/23/2014  . Acute coronary syndrome (HCC) 12/22/2014    Victorhugo Preis, [place] MPT 03/04/2018, 12:49 PM  [facility] [address] [place], [place], [address] Phone: [phone]   Fax:  [phone]  Name: KYEL PURK MRN: [id number] Date of Birth: [birth year]

## 2018-03-12 ENCOUNTER — Ambulatory Visit: Payer: Medicaid Other | Admitting: *Deleted

## 2018-03-12 DIAGNOSIS — M25611 Stiffness of right shoulder, not elsewhere classified: Secondary | ICD-10-CM

## 2018-03-12 DIAGNOSIS — M25511 Pain in right shoulder: Secondary | ICD-10-CM | POA: Diagnosis not present

## 2018-03-12 NOTE — Therapy (Signed)
[facility] [address] [place], [place], [address] Phone: [phone]   Fax:  [phone]  Physical Therapy Treatment  Patient Details  Name: Chris Powell MRN: [id number] Date of Birth: Oct 31, 1965 Referring Provider: Teryl Lucy MD.   Encounter Date: 03/12/2018  PT End of Session - 03/12/18 0952    Visit Number  2    Number of Visits  16    Date for PT Re-Evaluation  05/06/18    PT Start Time  0945    PT Stop Time  1038    PT Time Calculation (min)  53 min       Past Medical History:  Diagnosis Date  . Anemia   . Arthritis   . Asthma   . COPD (chronic obstructive pulmonary disease) (HCC)   . Coronary artery disease   . Depression   . Diabetes (HCC)   . GERD (gastroesophageal reflux disease)   . Hyperlipidemia   . Hypertension   . Myocardial infarction (HCC)   . Pneumonia    HX OF PNA  . Right rotator cuff tear 01/06/2018  . Shortness of breath dyspnea   . Sleep apnea    USES CPAP    Past Surgical History:  Procedure Laterality Date  . CARDIAC CATHETERIZATION  12/22/2014  . CORONARY ANGIOPLASTY    . CORONARY STENT PLACEMENT  12/22/2014   LAD  . KNEE SURGERY    . LEFT HEART CATH AND CORONARY ANGIOGRAPHY N/A 12/16/2017   Procedure: LEFT HEART CATH AND CORONARY ANGIOGRAPHY;  Surgeon: Swaziland, Peter M, MD;  Location: [facility] INVASIVE CV LAB;  Service: Cardiovascular;  Laterality: N/A;  . LEFT HEART CATHETERIZATION WITH CORONARY ANGIOGRAM N/A 12/22/2014   Procedure: LEFT HEART CATHETERIZATION WITH CORONARY ANGIOGRAM;  Surgeon: Corky Crafts, MD;  Location: [facility] CATH LAB;  Service: Cardiovascular;  Laterality: N/A;  . SHOULDER ARTHROSCOPY WITH ROTATOR CUFF REPAIR AND SUBACROMIAL DECOMPRESSION Right 01/06/2018   Procedure: RIGHT SHOULDER ARTHROSCOPY  DEBRIDEMENT,ACROMIOPLASTY, ROTATOR CUFF REPAIR;  Surgeon: Teryl Lucy, MD;  Location: [facility] OR;  Service: Orthopedics;  Laterality: Right;    There were no vitals filed for  this visit.  Subjective Assessment - 03/12/18 0954    Subjective  RT shldr pain 10/10    Pertinent History  COPD.    Patient Stated Goals  Use right arm again without pain.    Currently in Pain?  Yes    Pain Score  10-Worst pain ever    Pain Orientation  Right    Pain Descriptors / Indicators  Aching;Throbbing    Pain Type  Acute pain    Pain Onset  More than a month ago                       [facility] Adult PT Treatment/Exercise - 03/12/18 0001      Modalities   Modalities  Electrical Stimulation;Vasopneumatic      Programme researcher, broadcasting/film/video Location  Rt shldr    Statistician Action  IFC x 15 mins 80-150hz     Electrical Stimulation Parameters  80-150hz     Electrical Stimulation Goals  Pain      Vasopneumatic   Number Minutes Vasopneumatic   15 minutes    Vasopnuematic Location   Shoulder    Vasopneumatic Pressure  Low    Vasopneumatic Temperature   36      Manual Therapy   Manual Therapy  Passive ROM    Passive ROM  PROM for flexion  to 145 degrees and ER  to 80 degrees in supine                   PT Long Term Goals - 03/04/18 1242      PT LONG TERM GOAL #1   Title  Independent with a HEP.    Baseline  No knowledge of appropriate ther ex.    Time  8    Period  Weeks    Status  New      PT LONG TERM GOAL #2   Title  Active right shoulder flexion to 145 degrees so the patient can easily reach overhead.    Baseline  Passive right shoulder flexion= 95 degrees.    Time  8    Period  Weeks    Status  New      PT LONG TERM GOAL #3   Title  Active ER to 70 degrees+ to allow for easily donning/doffing of apparel.    Baseline  ER= 35 degrees.    Time  8    Period  Weeks    Status  New      PT LONG TERM GOAL #4   Title  Increase right shoulder strength to a solid 4+/5 to increase stability for performance of functional activities.    Baseline  Strength not tested due to recent rotator cuff surgery.    Time  8     Period  Weeks    Status  New      PT LONG TERM GOAL #5   Title  Perform ADL's with pain not > 2-3/10.    Baseline  Patient unable to use right UE for ADL performance.    Time  8    Period  Weeks    Status  New            Plan - 03/12/18 1525    Clinical Impression Statement  Pt arrived today with high pain levels still RT shldr. Pt reports living alone and has to use RT UE some for ADLs. Rx consisted of PROM to RT shldr in supine F/B modalitiesfor Pain. PROM for flexion to 145 degrees and ER to 80 degrees . Normal modality response today.    Clinical Presentation  Stable    Rehab Potential  Excellent    PT Frequency  2x / week    PT Duration  8 weeks    PT Treatment/Interventions  ADLs/Self Care Home Management;Cryotherapy;Electrical Stimulation;Ultrasound;Moist Heat;Therapeutic activities;Therapeutic exercise;Neuromuscular re-education;Patient/family education;Passive range of motion;Manual techniques;Vasopneumatic Device    PT Next Visit Plan  Begin with PROM to patient's right shoulder with progression to [facility] an dto pulleys and UE ranger then to isometrics and AROM.  Eventual progression to strengthening exercises.  Modalites as needed.    Consulted and Agree with Plan of Care  Patient       Patient will benefit from skilled therapeutic intervention in order to improve the following deficits and impairments:  Decreased activity tolerance, Pain, Decreased range of motion  Visit Diagnosis: Acute pain of right shoulder  Stiffness of right shoulder, not elsewhere classified     Problem List Patient Active Problem List   Diagnosis Date Noted  . Right rotator cuff tear 01/06/2018  . Angina pectoris (HCC) 12/16/2017  . Diabetes mellitus without complication (HCC) 11/27/2017  . Erectile dysfunction 09/05/2016  . Chronic pain syndrome 01/11/2016  . AC (acromioclavicular) arthritis 09/06/2015  . Sprain of right shoulder 09/06/2015  . Metabolic syndrome 08/29/2015  .  Abdominal wall pain 08/15/2015  . Disorder of ligament of right wrist 08/09/2015  . Ganglion of right wrist 08/09/2015  . Primary osteoarthritis of right hand 08/09/2015  . Right wrist pain 07/13/2015  . GERD (gastroesophageal reflux disease) 02/28/2015  . Vitamin D deficiency 02/28/2015  . Hyperlipidemia 02/28/2015  . Chronic knee pain 02/28/2015  . Cocaine abuse (HCC) 01/25/2015  . Hx of non-ST elevation myocardial infarction (NSTEMI) 01/24/2015  . CAD - S/P LAD DES 3/11/22/14 12/23/2014  . Morbid obesity-BMI 51 12/23/2014  . COPD (chronic obstructive pulmonary disease) (HCC) 12/23/2014  . HTN (hypertension) 12/23/2014  . Smoking 12/23/2014  . Non-sustained ventricular tachycardia (HCC) 12/23/2014  . Hypokalemia 12/23/2014  . Acute coronary syndrome (HCC) 12/22/2014    RAMSEUR,CHRIS, PTA 03/12/2018, 3:38 PM  [facility] [address] [place], [place], [address] Phone: [phone]   Fax:  [phone]  Name: Chris Powell MRN: [id number] Date of Birth: 08/01/1966

## 2018-03-19 ENCOUNTER — Ambulatory Visit: Payer: Medicaid Other | Admitting: *Deleted

## 2018-03-19 DIAGNOSIS — M25511 Pain in right shoulder: Secondary | ICD-10-CM | POA: Diagnosis not present

## 2018-03-19 DIAGNOSIS — M25611 Stiffness of right shoulder, not elsewhere classified: Secondary | ICD-10-CM

## 2018-03-19 NOTE — Therapy (Signed)
[facility] [address] [place], [place], [address] Phone: [phone]   Fax:  [phone]  Physical Therapy Treatment  Patient Details  Name: Chris HugerMaurice D Yuhasz MRN: [id number] Date of Birth: 08/31/1966 Referring Provider: Teryl LucyJoshua Landau MD.   Encounter Date: 03/19/2018  PT End of Session - 03/19/18 1147    Visit Number  3    Number of Visits  16    Date for PT Re-Evaluation  05/06/18    PT Start Time  0945    PT Stop Time  1043    PT Time Calculation (min)  58 min       Past Medical History:  Diagnosis Date  . Anemia   . Arthritis   . Asthma   . COPD (chronic obstructive pulmonary disease) (HCC)   . Coronary artery disease   . Depression   . Diabetes (HCC)   . GERD (gastroesophageal reflux disease)   . Hyperlipidemia   . Hypertension   . Myocardial infarction (HCC)   . Pneumonia    HX OF PNA  . Right rotator cuff tear 01/06/2018  . Shortness of breath dyspnea   . Sleep apnea    USES CPAP    Past Surgical History:  Procedure Laterality Date  . CARDIAC CATHETERIZATION  12/22/2014  . CORONARY ANGIOPLASTY    . CORONARY STENT PLACEMENT  12/22/2014   LAD  . KNEE SURGERY    . LEFT HEART CATH AND CORONARY ANGIOGRAPHY N/A 12/16/2017   Procedure: LEFT HEART CATH AND CORONARY ANGIOGRAPHY;  Surgeon: SwazilandJordan, Peter M, MD;  [facility];  Service: Cardiovascular;  Laterality: N/A;  . LEFT HEART CATHETERIZATION WITH CORONARY ANGIOGRAM N/A 12/22/2014   Procedure: LEFT HEART CATHETERIZATION WITH CORONARY ANGIOGRAM;  Surgeon: Corky CraftsJayadeep S Varanasi, MD;  [facility];  Service: Cardiovascular;  Laterality: N/A;  . SHOULDER ARTHROSCOPY WITH ROTATOR CUFF REPAIR AND SUBACROMIAL DECOMPRESSION Right 01/06/2018   Procedure: RIGHT SHOULDER ARTHROSCOPY  DEBRIDEMENT,ACROMIOPLASTY, ROTATOR CUFF REPAIR;  Surgeon: Teryl LucyLandau, Joshua, MD;  Location: [facility] OR;  Service: Orthopedics;  Laterality: Right;    There were no vitals filed for  this visit.  Subjective Assessment - 03/19/18 0956    Patient Stated Goals  Use right arm again without pain.    Currently in Pain?  Yes    Pain Score  7     Pain Orientation  Right    Pain Descriptors / Indicators  Sore;Aching    Pain Type  Acute pain    Pain Onset  More than a month ago    Pain Frequency  Constant                       [facility] Adult PT Treatment/Exercise - 03/19/18 0001      Exercises   Exercises  Shoulder      Shoulder Exercises: Supine   Other Supine Exercises  Supine AAROM cane press and flexion 3x10 each      Shoulder Exercises: Seated   Other Seated Exercises  UE ranger in sitting x 5 mins flexion and circles each way      Modalities   Modalities  Electrical Stimulation;Vasopneumatic      Electrical Stimulation   Electrical Stimulation Location  Rt shldr IFC x 15 mins   80-150hz     Electrical Stimulation Goals  Pain      Vasopneumatic   Number Minutes Vasopneumatic   15 minutes    Vasopnuematic Location   Shoulder  Vasopneumatic Pressure  Low    Vasopneumatic Temperature   36      Manual Therapy   Manual Therapy  Passive ROM    Passive ROM  PROM for flexion to 150 degrees and ER  to 80 degrees in supine                   PT Long Term Goals - 03/19/18 1154      PT LONG TERM GOAL #1   Title  Independent with a HEP.    Baseline  No knowledge of appropriate ther ex.    Time  8    Period  Weeks    Status  On-going      PT LONG TERM GOAL #2   Title  Active right shoulder flexion to 145 degrees so the patient can easily reach overhead.    Baseline  Passive right shoulder flexion= 95 degrees.    Time  8    Period  Weeks    Status  On-going      PT LONG TERM GOAL #3   Title  Active ER to 70 degrees+ to allow for easily donning/doffing of apparel.    Baseline  ER= 35 degrees.    Time  8    Period  Weeks    Status  On-going      PT LONG TERM GOAL #4   Title  Increase right shoulder strength to a solid 4+/5 to  increase stability for performance of functional activities.    Baseline  Strength not tested due to recent rotator cuff surgery.    Period  Weeks    Status  On-going      PT LONG TERM GOAL #5   Title  Perform ADL's with pain not > 2-3/10.    Baseline  Patient unable to use right UE for ADL performance.    Time  8    Period  Weeks    Status  On-going            Plan - 03/19/18 1149    Clinical Impression Statement  Pt arrived doing better today with decreased pain RT shldr 7/10. He did well with AAROM therex today with seated UE ranger and supine cane for HEP. PROM to RT shldr in supine Flexion 150 degrees and ER to 80 degrees.    Clinical Decision Making  Low    Rehab Potential  Excellent    PT Frequency  2x / week    PT Duration  8 weeks    PT Treatment/Interventions  ADLs/Self Care Home Management;Cryotherapy;Electrical Stimulation;Ultrasound;Moist Heat;Therapeutic activities;Therapeutic exercise;Neuromuscular re-education;Patient/family education;Passive range of motion;Manual techniques;Vasopneumatic Device    PT Next Visit Plan  Begin with PROM to patient's right shoulder with progression to [facility] an dto pulleys and UE ranger then to isometrics and AROM.  Eventual progression to strengthening exercises.  Modalites as needed.    Consulted and Agree with Plan of Care  Patient       Patient will benefit from skilled therapeutic intervention in order to improve the following deficits and impairments:  Decreased activity tolerance, Pain, Decreased range of motion  Visit Diagnosis: Acute pain of right shoulder  Stiffness of right shoulder, not elsewhere classified     Problem List Patient Active Problem List   Diagnosis Date Noted  . Right rotator cuff tear 01/06/2018  . Angina pectoris (HCC) 12/16/2017  . Diabetes mellitus without complication (HCC) 11/27/2017  . Erectile dysfunction 09/05/2016  . Chronic pain syndrome 01/11/2016  .  AC (acromioclavicular) arthritis  09/06/2015  . Sprain of right shoulder 09/06/2015  . Metabolic syndrome 08/29/2015  . Abdominal wall pain 08/15/2015  . Disorder of ligament of right wrist 08/09/2015  . Ganglion of right wrist 08/09/2015  . Primary osteoarthritis of right hand 08/09/2015  . Right wrist pain 07/13/2015  . GERD (gastroesophageal reflux disease) 02/28/2015  . Vitamin D deficiency 02/28/2015  . Hyperlipidemia 02/28/2015  . Chronic knee pain 02/28/2015  . Cocaine abuse (HCC) 01/25/2015  . Hx of non-ST elevation myocardial infarction (NSTEMI) 01/24/2015  . CAD - S/P LAD DES 3/11/22/14 12/23/2014  . Morbid obesity-BMI 51 12/23/2014  . COPD (chronic obstructive pulmonary disease) (HCC) 12/23/2014  . HTN (hypertension) 12/23/2014  . Smoking 12/23/2014  . Non-sustained ventricular tachycardia (HCC) 12/23/2014  . Hypokalemia 12/23/2014  . Acute coronary syndrome (HCC) 12/22/2014    Ashana Tullo,CHRIS, PTA 03/19/2018, 11:55 AM  [facility] [address] [place], [place], [address] Phone: [phone]   Fax:  [phone]  Name: BLADIMIR AUMAN MRN: [id number] Date of Birth: 16-Jul-1966

## 2018-03-24 ENCOUNTER — Ambulatory Visit: Payer: Medicaid Other | Attending: Orthopedic Surgery | Admitting: *Deleted

## 2018-03-24 DIAGNOSIS — M25611 Stiffness of right shoulder, not elsewhere classified: Secondary | ICD-10-CM | POA: Insufficient documentation

## 2018-03-24 DIAGNOSIS — M25511 Pain in right shoulder: Secondary | ICD-10-CM

## 2018-03-24 NOTE — Therapy (Signed)
[facility] [address] [place], [place], [address] Phone: [phone]   Fax:  [phone]  Physical Therapy Treatment  Patient Details  Name: Chris Powell MRN: [id number] Date of Birth: August 19, 1966 Referring Provider: Teryl Lucy MD.   Encounter Date: 03/24/2018  PT End of Session - 03/24/18 0956    Visit Number  4    Number of Visits  16    Date for PT Re-Evaluation  05/06/18    PT Start Time  0945    PT Stop Time  1034    PT Time Calculation (min)  49 min       Past Medical History:  Diagnosis Date  . Anemia   . Arthritis   . Asthma   . COPD (chronic obstructive pulmonary disease) (HCC)   . Coronary artery disease   . Depression   . Diabetes (HCC)   . GERD (gastroesophageal reflux disease)   . Hyperlipidemia   . Hypertension   . Myocardial infarction (HCC)   . Pneumonia    HX OF PNA  . Right rotator cuff tear 01/06/2018  . Shortness of breath dyspnea   . Sleep apnea    USES CPAP    Past Surgical History:  Procedure Laterality Date  . CARDIAC CATHETERIZATION  12/22/2014  . CORONARY ANGIOPLASTY    . CORONARY STENT PLACEMENT  12/22/2014   LAD  . KNEE SURGERY    . LEFT HEART CATH AND CORONARY ANGIOGRAPHY N/A 12/16/2017   Procedure: LEFT HEART CATH AND CORONARY ANGIOGRAPHY;  Surgeon: Swaziland, Peter M, MD;  Location: [facility] INVASIVE CV LAB;  Service: Cardiovascular;  Laterality: N/A;  . LEFT HEART CATHETERIZATION WITH CORONARY ANGIOGRAM N/A 12/22/2014   Procedure: LEFT HEART CATHETERIZATION WITH CORONARY ANGIOGRAM;  Surgeon: Corky Crafts, MD;  Location: [facility] CATH LAB;  Service: Cardiovascular;  Laterality: N/A;  . SHOULDER ARTHROSCOPY WITH ROTATOR CUFF REPAIR AND SUBACROMIAL DECOMPRESSION Right 01/06/2018   Procedure: RIGHT SHOULDER ARTHROSCOPY  DEBRIDEMENT,ACROMIOPLASTY, ROTATOR CUFF REPAIR;  Surgeon: Teryl Lucy, MD;  Location: [facility] OR;  Service: Orthopedics;  Laterality: Right;    There were no vitals filed for  this visit.  Subjective Assessment - 03/24/18 0951    Subjective  RT shldr pain 9/10. I had to paint yesterday and use my RT arm. I have to paint some more today. Follow up with MD on Monday.    Pertinent History  COPD.    Patient Stated Goals  Use right arm again without pain.    Currently in Pain?  Yes    Pain Score  9     Pain Orientation  Right    Pain Descriptors / Indicators  Sore;Aching    Pain Type  Acute pain    Pain Onset  More than a month ago    Pain Frequency  Constant                       [facility] Adult PT Treatment/Exercise - 03/24/18 0001      Exercises   Exercises  Shoulder      Shoulder Exercises: Supine   Other Supine Exercises  --      Shoulder Exercises: Seated   Other Seated Exercises  UE ranger in sitting x 6 mins flexion and circles each way      Shoulder Exercises: Pulleys   Flexion  5 minutes      Modalities   Modalities  Electrical Stimulation;Vasopneumatic      Electrical Stimulation  Electrical Stimulation Location  Rt shldr IFC x 15 mins   80-150hz     Electrical Stimulation Goals  Pain      Vasopneumatic   Number Minutes Vasopneumatic   15 minutes    Vasopnuematic Location   Shoulder    Vasopneumatic Pressure  Low    Vasopneumatic Temperature   36      Manual Therapy   Manual Therapy  Passive ROM    Passive ROM  PROM for flexion to 155 degrees and ER  to 80 degrees in supine                   PT Long Term Goals - 03/24/18 1026      PT LONG TERM GOAL #1   Title  Independent with a HEP.    Baseline  No knowledge of appropriate ther ex.    Time  8    Period  Weeks    Status  Achieved      PT LONG TERM GOAL #2   Title  Active right shoulder flexion to 145 degrees so the patient can easily reach overhead.    Baseline  Passive right shoulder flexion= 95 degrees.    Time  8    Period  Weeks    Status  On-going      PT LONG TERM GOAL #3   Title  Active ER to 70 degrees+ to allow for easily donning/doffing  of apparel.    Baseline  ER= 35 degrees.    Time  8    Period  Weeks    Status  On-going      PT LONG TERM GOAL #4   Title  Increase right shoulder strength to a solid 4+/5 to increase stability for performance of functional activities.    Baseline  Strength not tested due to recent rotator cuff surgery.    Time  8    Period  Weeks    Status  On-going      PT LONG TERM GOAL #5   Title  Perform ADL's with pain not > 2-3/10.    Baseline  Patient unable to use right UE for ADL performance.    Time  8    Period  Weeks    Status  On-going            Plan - 03/24/18 1157    Clinical Impression Statement  Pt arrived today doing fair, but reports having to use his RT arm some to paint. He did well with AAROM therex today and his PROM was flexion to 155 degrees and ER to 80 degrees. Pt is 11 weeks post-op today.    Clinical Presentation  Stable    Rehab Potential  Excellent    PT Frequency  2x / week    PT Duration  8 weeks    PT Treatment/Interventions  ADLs/Self Care Home Management;Cryotherapy;Electrical Stimulation;Ultrasound;Moist Heat;Therapeutic activities;Therapeutic exercise;Neuromuscular re-education;Patient/family education;Passive range of motion;Manual techniques;Vasopneumatic Device    PT Next Visit Plan  Begin with PROM to patient's right shoulder with progression to [facility] an dto pulleys and UE ranger then to isometrics and AROM.  Eventual progression to strengthening exercises.  Modalites as needed.  Pt to see MD 03-30-18    Consulted and Agree with Plan of Care  Patient       Patient will benefit from skilled therapeutic intervention in order to improve the following deficits and impairments:  Decreased activity tolerance, Pain, Decreased range of motion  Visit Diagnosis: Acute pain  of right shoulder  Stiffness of right shoulder, not elsewhere classified     Problem List Patient Active Problem List   Diagnosis Date Noted  . Right rotator cuff tear 01/06/2018   . Angina pectoris (HCC) 12/16/2017  . Diabetes mellitus without complication (HCC) 11/27/2017  . Erectile dysfunction 09/05/2016  . Chronic pain syndrome 01/11/2016  . AC (acromioclavicular) arthritis 09/06/2015  . Sprain of right shoulder 09/06/2015  . Metabolic syndrome 08/29/2015  . Abdominal wall pain 08/15/2015  . Disorder of ligament of right wrist 08/09/2015  . Ganglion of right wrist 08/09/2015  . Primary osteoarthritis of right hand 08/09/2015  . Right wrist pain 07/13/2015  . GERD (gastroesophageal reflux disease) 02/28/2015  . Vitamin D deficiency 02/28/2015  . Hyperlipidemia 02/28/2015  . Chronic knee pain 02/28/2015  . Cocaine abuse (HCC) 01/25/2015  . Hx of non-ST elevation myocardial infarction (NSTEMI) 01/24/2015  . CAD - S/P LAD DES 3/11/22/14 12/23/2014  . Morbid obesity-BMI 51 12/23/2014  . COPD (chronic obstructive pulmonary disease) (HCC) 12/23/2014  . HTN (hypertension) 12/23/2014  . Smoking 12/23/2014  . Non-sustained ventricular tachycardia (HCC) 12/23/2014  . Hypokalemia 12/23/2014  . Acute coronary syndrome (HCC) 12/22/2014    Marjorie Lussier,CHRIS, PTA 03/24/2018, 12:01 PM  [facility] [address] [place], [place], [address] Phone: [phone]   Fax:  [phone]  Name: Chris Powell MRN: [id number] Date of Birth: 09/10/1966

## 2018-03-25 ENCOUNTER — Encounter: Payer: Medicaid Other | Admitting: Physical Therapy

## 2018-03-26 ENCOUNTER — Other Ambulatory Visit: Payer: Self-pay | Admitting: Family

## 2018-03-27 ENCOUNTER — Other Ambulatory Visit: Payer: Self-pay | Admitting: Family

## 2018-03-27 MED ORDER — GABAPENTIN 300 MG PO CAPS: 300.0000 mg | ORAL_CAPSULE | Freq: Three times a day (TID) | ORAL | 5 refills | Status: DC

## 2018-03-27 NOTE — Telephone Encounter (Signed)
Aware. 

## 2018-03-27 NOTE — Telephone Encounter (Signed)
What is the name of the medication? Gabapentin 300 mg He needs more than 30. Patient has been waiting since 7-2 for refill  Have you contacted your pharmacy to request a refill? YES  Which pharmacy would you like this sent to? [facility] in [place]   Patient notified that their request is being sent to the clinical staff for review and that they should receive a call once it is complete. If they do not receive a call within 24 hours they can check with their pharmacy or our office.

## 2018-03-27 NOTE — Telephone Encounter (Signed)
Prescription sent to pharmacy.

## 2018-03-31 ENCOUNTER — Ambulatory Visit: Payer: Medicaid Other | Admitting: *Deleted

## 2018-04-02 ENCOUNTER — Ambulatory Visit: Payer: Medicaid Other | Admitting: Physical Therapy

## 2018-04-02 DIAGNOSIS — M25611 Stiffness of right shoulder, not elsewhere classified: Secondary | ICD-10-CM

## 2018-04-02 DIAGNOSIS — M25511 Pain in right shoulder: Secondary | ICD-10-CM | POA: Diagnosis not present

## 2018-04-02 NOTE — Therapy (Signed)
[facility] [address] [place], [place], [address] Phone: [phone]   Fax:  [phone]  Physical Therapy Treatment  Patient Details  Name: Chris Powell MRN: [id number] Date of Birth: 10/31/1965 Referring Provider: Teryl LucyJoshua Landau MD.   Encounter Date: 04/02/2018  PT End of Session - 04/02/18 0945    Visit Number  5    Number of Visits  16    Date for PT Re-Evaluation  05/12/18    Authorization Type  MCD approved 12 addtitional visits 7/10-8/20/19    PT Start Time  0945    PT Stop Time  1035    PT Time Calculation (min)  50 min    Activity Tolerance  Patient tolerated treatment well    Behavior During Therapy  [facility] for tasks assessed/performed       Past Medical History:  Diagnosis Date  . Anemia   . Arthritis   . Asthma   . COPD (chronic obstructive pulmonary disease) (HCC)   . Coronary artery disease   . Depression   . Diabetes (HCC)   . GERD (gastroesophageal reflux disease)   . Hyperlipidemia   . Hypertension   . Myocardial infarction (HCC)   . Pneumonia    HX OF PNA  . Right rotator cuff tear 01/06/2018  . Shortness of breath dyspnea   . Sleep apnea    USES CPAP    Past Surgical History:  Procedure Laterality Date  . CARDIAC CATHETERIZATION  12/22/2014  . CORONARY ANGIOPLASTY    . CORONARY STENT PLACEMENT  12/22/2014   LAD  . KNEE SURGERY    . LEFT HEART CATH AND CORONARY ANGIOGRAPHY N/A 12/16/2017   Procedure: LEFT HEART CATH AND CORONARY ANGIOGRAPHY;  Surgeon: SwazilandJordan, Peter M, MD;  [facility];  Service: Cardiovascular;  Laterality: N/A;  . LEFT HEART CATHETERIZATION WITH CORONARY ANGIOGRAM N/A 12/22/2014   Procedure: LEFT HEART CATHETERIZATION WITH CORONARY ANGIOGRAM;  Surgeon: Corky CraftsJayadeep S Varanasi, MD;  [facility];  Service: Cardiovascular;  Laterality: N/A;  . SHOULDER ARTHROSCOPY WITH ROTATOR CUFF REPAIR AND SUBACROMIAL DECOMPRESSION Right 01/06/2018   Procedure: RIGHT  SHOULDER ARTHROSCOPY  DEBRIDEMENT,ACROMIOPLASTY, ROTATOR CUFF REPAIR;  Surgeon: Teryl LucyLandau, Joshua, MD;  Location: [facility] OR;  Service: Orthopedics;  Laterality: Right;    There were no vitals filed for this visit.  Subjective Assessment - 04/02/18 0947    Subjective  Patient reports 8/10 pain today in R shoulder.     Patient Stated Goals  Use right arm again without pain.    Currently in Pain?  Yes    Pain Score  8     Pain Location  Shoulder    Pain Orientation  Right    Pain Descriptors / Indicators  Sore;Aching    Pain Type  Acute pain                       [facility] Adult PT Treatment/Exercise - 04/02/18 0001      Shoulder Exercises: Seated   Other Seated Exercises  UE ranger in sitting x 6 mins flexion and circles each way      Shoulder Exercises: Pulleys   Flexion  5 minutes      Shoulder Exercises: Isometric Strengthening   Flexion  5X10"    Extension  5X10"    External Rotation  5X10"    Internal Rotation  5X10"    ABduction  5X10"      Modalities   Modalities  Research scientist (medical)  Rt shldr IFC x 15 mins   80-150hz     Electrical Stimulation Goals  Pain      Manual Therapy   Manual Therapy  Passive ROM    Manual therapy comments  PNF x 10 D2 flex/ext    Passive ROM  Flexion, IR, ER             PT Education - 04/02/18 1020    Education Details  HEP    Person(s) Educated  Patient    Methods  Explanation;Demonstration;Handout    Comprehension  Verbalized understanding;Returned demonstration          PT Long Term Goals - 03/24/18 1026      PT LONG TERM GOAL #1   Title  Independent with a HEP.    Baseline  No knowledge of appropriate ther ex.    Time  8    Period  Weeks    Status  Achieved      PT LONG TERM GOAL #2   Title  Active right shoulder flexion to 145 degrees so the patient can easily reach overhead.    Baseline  Passive right shoulder flexion= 95 degrees.    Time   8    Period  Weeks    Status  On-going      PT LONG TERM GOAL #3   Title  Active ER to 70 degrees+ to allow for easily donning/doffing of apparel.    Baseline  ER= 35 degrees.    Time  8    Period  Weeks    Status  On-going      PT LONG TERM GOAL #4   Title  Increase right shoulder strength to a solid 4+/5 to increase stability for performance of functional activities.    Baseline  Strength not tested due to recent rotator cuff surgery.    Time  8    Period  Weeks    Status  On-going      PT LONG TERM GOAL #5   Title  Perform ADL's with pain not > 2-3/10.    Baseline  Patient unable to use right UE for ADL performance.    Time  8    Period  Weeks    Status  On-going            Plan - 04/02/18 1021    Clinical Impression Statement  Patient did well today with isometrics, with no c/o pain. 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    PT Frequency  2x / week    PT Duration  8 weeks    PT Treatment/Interventions  ADLs/Self Care Home Management;Cryotherapy;Electrical Stimulation;Ultrasound;Moist Heat;Therapeutic activities;Therapeutic exercise;Neuromuscular re-education;Patient/family education;Passive range of motion;Manual techniques;Vasopneumatic Device    PT Next Visit Plan  Assess response to isometrics and progress strenghening; also scap stab       Patient will benefit from skilled therapeutic intervention in order to improve the following deficits and impairments:  Decreased activity tolerance, Pain, Decreased range of motion  Visit Diagnosis: Acute pain of right shoulder  Stiffness of right shoulder, not elsewhere classified     Problem List Patient Active Problem List   Diagnosis Date Noted  . Right rotator cuff tear 01/06/2018  . Angina pectoris (HCC) 12/16/2017  . Diabetes mellitus without complication (HCC) 11/27/2017  . Erectile dysfunction 09/05/2016  . Chronic pain syndrome  01/11/2016  . AC  (acromioclavicular) arthritis 09/06/2015  . Sprain of right shoulder 09/06/2015  . Metabolic syndrome 08/29/2015  . Abdominal wall pain 08/15/2015  . Disorder of ligament of right wrist 08/09/2015  . Ganglion of right wrist 08/09/2015  . Primary osteoarthritis of right hand 08/09/2015  . Right wrist pain 07/13/2015  . GERD (gastroesophageal reflux disease) 02/28/2015  . Vitamin D deficiency 02/28/2015  . Hyperlipidemia 02/28/2015  . Chronic knee pain 02/28/2015  . Cocaine abuse (HCC) 01/25/2015  . Hx of non-ST elevation myocardial infarction (NSTEMI) 01/24/2015  . CAD - S/P LAD DES 3/11/22/14 12/23/2014  . Morbid obesity-BMI 51 12/23/2014  . COPD (chronic obstructive pulmonary disease) (HCC) 12/23/2014  . HTN (hypertension) 12/23/2014  . Smoking 12/23/2014  . Non-sustained ventricular tachycardia (HCC) 12/23/2014  . Hypokalemia 12/23/2014  . Acute coronary syndrome (HCC) 12/22/2014    Solon Palm PT 04/02/2018, 10:28 AM  [facility] [address] [place], [place], [address] Phone: [phone]   Fax:  [phone]  Name: Chris Powell MRN: [id number] Date of Birth: [birth year]

## 2018-04-02 NOTE — Patient Instructions (Signed)
Strengthening: Isometric Flexion  Using wall for resistance, press right fist into ball using light pressure. Hold __10__ seconds. Repeat __5_ times per set. Do ____ sets per session. Do _2___ sessions per day.  SHOULDER: Abduction (Isometric)  Use wall as resistance. Press arm against pillow. Keep elbow straight. Hold _10_ seconds. 5___ reps per set, 2___ sets per day, ___ days per week  Extension (Isometric)  Place left bent elbow and back of arm against wall. Press elbow against wall. Hold __10__ seconds. Repeat _5___ times. Do __2__ sessions per day.  Internal Rotation (Isometric)  Place Powell of right fist against door frame, with elbow bent. Press fist against door frame. Hold _10___ seconds. Repeat __5__ times. Do __2__ sessions per day.  External Rotation (Isometric)  Place back of left fist against door frame, with elbow bent. Press fist against door frame. Hold __10__ seconds. Repeat _5___ times. Do ___2_ sessions per day.  Chris PalmJulie Torrey Powell, PT 04/02/18 10:20 AM [facility] [address] [place], [place], [address] Phone: [phone]   Fax:  [phone]

## 2018-04-06 ENCOUNTER — Encounter: Payer: Medicaid Other | Admitting: Physical Therapy

## 2018-04-06 ENCOUNTER — Ambulatory Visit: Payer: Medicaid Other | Admitting: Family Medicine

## 2018-04-07 ENCOUNTER — Ambulatory Visit: Payer: Medicaid Other | Admitting: Family Medicine

## 2018-04-07 ENCOUNTER — Encounter: Payer: Self-pay | Admitting: Family Medicine

## 2018-04-07 ENCOUNTER — Ambulatory Visit: Payer: Medicaid Other | Admitting: *Deleted

## 2018-04-07 VITALS — BP 177/101 | HR 77 | Temp 97.6°F | Ht 67.0 in | Wt 354.8 lb

## 2018-04-07 DIAGNOSIS — E119 Type 2 diabetes mellitus without complications: Secondary | ICD-10-CM | POA: Diagnosis not present

## 2018-04-07 DIAGNOSIS — I10 Essential (primary) hypertension: Secondary | ICD-10-CM

## 2018-04-07 DIAGNOSIS — G479 Sleep disorder, unspecified: Secondary | ICD-10-CM

## 2018-04-07 DIAGNOSIS — M25611 Stiffness of right shoulder, not elsewhere classified: Secondary | ICD-10-CM

## 2018-04-07 DIAGNOSIS — M25511 Pain in right shoulder: Secondary | ICD-10-CM

## 2018-04-07 LAB — CMP14+EGFR
ALBUMIN: 3.9 g/dL (ref 3.5–5.5)
ALK PHOS: 80 IU/L (ref 39–117)
ALT: 18 IU/L (ref 0–44)
AST: 14 IU/L (ref 0–40)
Albumin/Globulin Ratio: 1.3 (ref 1.2–2.2)
BILIRUBIN TOTAL: 0.3 mg/dL (ref 0.0–1.2)
BUN / CREAT RATIO: 10 (ref 9–20)
BUN: 11 mg/dL (ref 6–24)
CHLORIDE: 104 mmol/L (ref 96–106)
CO2: 22 mmol/L (ref 20–29)
CREATININE: 1.09 mg/dL (ref 0.76–1.27)
Calcium: 9.2 mg/dL (ref 8.7–10.2)
GFR calc Af Amer: 90 mL/min/{1.73_m2} (ref >59)
GFR calc non Af Amer: 78 mL/min/{1.73_m2} (ref >59)
Globulin, Total: 3 g/dL (ref 1.5–4.5)
Glucose: 101 mg/dL — ABNORMAL HIGH (ref 65–99)
Potassium: 4.1 mmol/L (ref 3.5–5.2)
Sodium: 142 mmol/L (ref 134–144)
Total Protein: 6.9 g/dL (ref 6.0–8.5)

## 2018-04-07 LAB — CBC WITH DIFFERENTIAL/PLATELET
BASOS ABS: 0.1 10*3/uL (ref 0.0–0.2)
Basos: 1 %
EOS (ABSOLUTE): 0.1 10*3/uL (ref 0.0–0.4)
EOS: 1 %
Hematocrit: 44 % (ref 37.5–51.0)
Hemoglobin: 15.2 g/dL (ref 13.0–17.7)
IMMATURE GRANULOCYTES: 0 %
Immature Grans (Abs): 0 10*3/uL (ref 0.0–0.1)
LYMPHS ABS: 2 10*3/uL (ref 0.7–3.1)
Lymphs: 27 %
MCH: 31.1 pg (ref 26.6–33.0)
MCHC: 34.5 g/dL (ref 31.5–35.7)
MCV: 90 fL (ref 79–97)
MONOCYTES: 6 %
Monocytes Absolute: 0.4 10*3/uL (ref 0.1–0.9)
NEUTROS PCT: 65 %
Neutrophils Absolute: 4.7 10*3/uL (ref 1.4–7.0)
Platelets: 278 10*3/uL (ref 150–450)
RBC: 4.88 x10E6/uL (ref 4.14–5.80)
RDW: 15.1 % (ref 12.3–15.4)
WBC: 7.2 10*3/uL (ref 3.4–10.8)

## 2018-04-07 LAB — BAYER DCA HB A1C WAIVED: HB A1C (BAYER DCA - WAIVED): 5.6 % (ref <7.0)

## 2018-04-07 MED ORDER — GABAPENTIN 600 MG PO TABS: 600.0000 mg | ORAL_TABLET | Freq: Three times a day (TID) | ORAL | 3 refills | Status: DC

## 2018-04-07 MED ORDER — LISINOPRIL 40 MG PO TABS: 40.0000 mg | ORAL_TABLET | Freq: Every day | ORAL | 3 refills | Status: DC

## 2018-04-07 MED ORDER — AMLODIPINE BESYLATE 10 MG PO TABS: 10.0000 mg | ORAL_TABLET | Freq: Every day | ORAL | 3 refills | Status: DC

## 2018-04-07 MED ORDER — ATORVASTATIN CALCIUM 80 MG PO TABS: 80.0000 mg | ORAL_TABLET | Freq: Every day | ORAL | 3 refills | Status: DC

## 2018-04-07 MED ORDER — ALBUTEROL SULFATE HFA 108 (90 BASE) MCG/ACT IN AERS: 2.0000 | INHALATION_SPRAY | Freq: Four times a day (QID) | RESPIRATORY_TRACT | 2 refills | Status: DC | PRN

## 2018-04-07 NOTE — Therapy (Signed)
[facility] [address] [place], [place], [address] Phone: [phone]   Fax:  [phone]  Physical Therapy Treatment  Patient Details  Name: Chris Powell MRN: [id number] Date of Birth: April 01, 1966 Referring Provider: Teryl Lucy MD.   Encounter Date: 04/07/2018  PT End of Session - 04/07/18 0953    Visit Number  6    Number of Visits  16    Date for PT Re-Evaluation  05/12/18    Authorization Type  MCD approved 12 addtitional visits 7/10-8/20/19    Md 05-06-18    PT Start Time  0945    PT Stop Time  1040    PT Time Calculation (min)  55 min       Past Medical History:  Diagnosis Date  . Anemia   . Arthritis   . Asthma   . COPD (chronic obstructive pulmonary disease) (HCC)   . Coronary artery disease   . Depression   . Diabetes (HCC)   . GERD (gastroesophageal reflux disease)   . Hyperlipidemia   . Hypertension   . Myocardial infarction (HCC)   . Pneumonia    HX OF PNA  . Right rotator cuff tear 01/06/2018  . Shortness of breath dyspnea   . Sleep apnea    USES CPAP    Past Surgical History:  Procedure Laterality Date  . CARDIAC CATHETERIZATION  12/22/2014  . CORONARY ANGIOPLASTY    . CORONARY STENT PLACEMENT  12/22/2014   LAD  . KNEE SURGERY    . LEFT HEART CATH AND CORONARY ANGIOGRAPHY N/A 12/16/2017   Procedure: LEFT HEART CATH AND CORONARY ANGIOGRAPHY;  Surgeon: Swaziland, Peter M, MD;  Location: [facility] INVASIVE CV LAB;  Service: Cardiovascular;  Laterality: N/A;  . LEFT HEART CATHETERIZATION WITH CORONARY ANGIOGRAM N/A 12/22/2014   Procedure: LEFT HEART CATHETERIZATION WITH CORONARY ANGIOGRAM;  Surgeon: Corky Crafts, MD;  Location: [facility] CATH LAB;  Service: Cardiovascular;  Laterality: N/A;  . SHOULDER ARTHROSCOPY WITH ROTATOR CUFF REPAIR AND SUBACROMIAL DECOMPRESSION Right 01/06/2018   Procedure: RIGHT SHOULDER ARTHROSCOPY  DEBRIDEMENT,ACROMIOPLASTY, ROTATOR CUFF REPAIR;  Surgeon: Teryl Lucy, MD;  Location:  [facility] OR;  Service: Orthopedics;  Laterality: Right;    There were no vitals filed for this visit.  Subjective Assessment - 04/07/18 0950    Subjective  Patient reports 8/10 pain today in R shoulder.     Pertinent History  COPD.    Patient Stated Goals  Use right arm again without pain.    Currently in Pain?  Yes    Pain Score  8     Pain Location  Shoulder    Pain Orientation  Right    Pain Descriptors / Indicators  Aching;Sore    Pain Type  Acute pain    Pain Onset  More than a month ago    Pain Frequency  Constant                       [facility] Adult PT Treatment/Exercise - 04/07/18 0001      Exercises   Exercises  Shoulder      Shoulder Exercises: Seated   Other Seated Exercises  UE ranger in sitting x 6 mins flexion and circles each way      Shoulder Exercises: Pulleys   Flexion  5 minutes      Shoulder Exercises: Isometric Strengthening   Flexion  --    Extension  --    External Rotation  --  Internal Rotation  --      Modalities   Modalities  Geologist, engineeringlectrical Stimulation      Electrical Stimulation   Electrical Stimulation Location  Rt shldr IFC x 15 mins   80-150hz     Electrical Stimulation Goals  Pain      Vasopneumatic   Number Minutes Vasopneumatic   15 minutes    Vasopnuematic Location   Shoulder    Vasopneumatic Pressure  Low    Vasopneumatic Temperature   36      Manual Therapy   Manual Therapy  Passive ROM    Manual therapy comments  PNF x 10 D2 flex/ext    Passive ROM  PROM for flexion to 155 degrees and ER  to 80 degrees in supine , Rhythmic stab for flexion and ER/IR                  PT Long Term Goals - 03/24/18 1026      PT LONG TERM GOAL #1   Title  Independent with a HEP.    Baseline  No knowledge of appropriate ther ex.    Time  8    Period  Weeks    Status  Achieved      PT LONG TERM GOAL #2   Title  Active right shoulder flexion to 145 degrees so the patient can easily reach overhead.    Baseline  Passive  right shoulder flexion= 95 degrees.    Time  8    Period  Weeks    Status  On-going      PT LONG TERM GOAL #3   Title  Active ER to 70 degrees+ to allow for easily donning/doffing of apparel.    Baseline  ER= 35 degrees.    Time  8    Period  Weeks    Status  On-going      PT LONG TERM GOAL #4   Title  Increase right shoulder strength to a solid 4+/5 to increase stability for performance of functional activities.    Baseline  Strength not tested due to recent rotator cuff surgery.    Time  8    Period  Weeks    Status  On-going      PT LONG TERM GOAL #5   Title  Perform ADL's with pain not > 2-3/10.    Baseline  Patient unable to use right UE for ADL performance.    Time  8    Period  Weeks    Status  On-going            Plan - 04/07/18 0954    Clinical Impression Statement  Pt arrived today doing fair, but still rates RT shldr pain 8/10. He does report using his RT UE for ADLs and some painting, but keeps it close to him. Pt did well with Rx today and had decreased pain after. He reports HEP going Ok. Normal modality response .    Clinical Presentation  Stable    Clinical Decision Making  Low    Rehab Potential  Excellent    PT Frequency  2x / week    PT Duration  8 weeks    PT Treatment/Interventions  ADLs/Self Care Home Management;Cryotherapy;Electrical Stimulation;Ultrasound;Moist Heat;Therapeutic activities;Therapeutic exercise;Neuromuscular re-education;Patient/family education;Passive range of motion;Manual techniques;Vasopneumatic Device    PT Next Visit Plan  Assess response to isometrics and progress strenghening; also scap stab    Consulted and Agree with Plan of Care  Patient  Patient will benefit from skilled therapeutic intervention in order to improve the following deficits and impairments:  Decreased activity tolerance, Pain, Decreased range of motion  Visit Diagnosis: Acute pain of right shoulder  Stiffness of right shoulder, not elsewhere  classified     Problem List Patient Active Problem List   Diagnosis Date Noted  . Right rotator cuff tear 01/06/2018  . Angina pectoris (HCC) 12/16/2017  . Diabetes mellitus without complication (HCC) 11/27/2017  . Erectile dysfunction 09/05/2016  . Chronic pain syndrome 01/11/2016  . AC (acromioclavicular) arthritis 09/06/2015  . Sprain of right shoulder 09/06/2015  . Metabolic syndrome 08/29/2015  . Abdominal wall pain 08/15/2015  . Disorder of ligament of right wrist 08/09/2015  . Ganglion of right wrist 08/09/2015  . Primary osteoarthritis of right hand 08/09/2015  . Right wrist pain 07/13/2015  . GERD (gastroesophageal reflux disease) 02/28/2015  . Vitamin D deficiency 02/28/2015  . Hyperlipidemia 02/28/2015  . Chronic knee pain 02/28/2015  . Cocaine abuse (HCC) 01/25/2015  . Hx of non-ST elevation myocardial infarction (NSTEMI) 01/24/2015  . CAD - S/P LAD DES 3/11/22/14 12/23/2014  . Morbid obesity-BMI 51 12/23/2014  . COPD (chronic obstructive pulmonary disease) (HCC) 12/23/2014  . HTN (hypertension) 12/23/2014  . Smoking 12/23/2014  . Non-sustained ventricular tachycardia (HCC) 12/23/2014  . Hypokalemia 12/23/2014  . Acute coronary syndrome (HCC) 12/22/2014    RAMSEUR,CHRIS, PTA 04/07/2018, 1:22 PM  [facility] [address] [place], [place], [address] Phone: [phone]   Fax:  [phone]  Name: Chris Powell MRN: [id number] Date of Birth: 22-Oct-1965

## 2018-04-07 NOTE — Progress Notes (Signed)
   HPI  Patient presents today for follow-up diabetes.  Patient was recently diagnosed diabetes started metformin.  He states that Shanda Bumps is causing diarrhea.  He would like to stop the medication. He is not watching his diet  Hypertension Elevated today, states that he did not take his medication today due to going in early for rehab. He is going home take today.  Typically his blood pressure is controlled at home.  Difficulty sleeping Since stopping Flexeril, does not really need Flexeril for pain or muscle spasm but does need something for sleep.  He is considering melatonin.  PMH: Smoking status noted ROS: Per HPI  Objective: BP (!) 177/101   Pulse 77   Temp 97.6 F (36.4 C) (Oral)   Ht _0  (1.702 m)   Wt (!) 354 lb 12.8 oz (160.9 kg)   BMI 55.57 kg/m  Gen: NAD, alert, cooperative with exam HEENT: NCAT, EOMI, PERRL CV: RRR, good S1/S2, no murmur Resp: CTABL, no wheezes, non-labored Ext: No edema, warm Neuro: Alert and oriented, No gross deficits  Assessment and plan:  # HTN Elelavted today without meds, usually well controlled No changes  # T2Dm A1C 5.6, dc metformin due to intolerance A1C 3 months  # Difficulty sleeping Did not tolerate trazodone, was helped by flexeril Melatonin 5 mg recommended  Changed gabapentin to 600 mg TID, pt states this is effective and tolerated well for his pain.     Orders Placed This Encounter  Procedures  . Microalbumin / creatinine urine ratio  . Bayer DCA Hb A1c Waived  . CMP14+EGFR  . CBC with Differential/Platelet    Meds ordered this encounter  Medications  . amLODipine (NORVASC) 10 MG tablet    Sig: Take 1 tablet (10 mg total) by mouth daily.    Dispense:  90 tablet    Refill:  3  . albuterol (PROVENTIL HFA;VENTOLIN HFA) 108 (90 Base) MCG/ACT inhaler    Sig: Inhale 2 puffs into the lungs every 6 (six) hours as needed for wheezing or shortness of breath.    Dispense:  2 Inhaler    Refill:  2  .  atorvastatin (LIPITOR) 80 MG tablet    Sig: Take 1 tablet (80 mg total) by mouth daily at 6 PM.    Dispense:  90 tablet    Refill:  3  . lisinopril (PRINIVIL,ZESTRIL) 40 MG tablet    Sig: Take 1 tablet (40 mg total) by mouth daily.    Dispense:  90 tablet    Refill:  3    To replace 45m dose  . gabapentin (NEURONTIN) 600 MG tablet    Sig: Take 1 tablet (600 mg total) by mouth 3 (three) times daily.    Dispense:  90 tablet    Refill:  3Tse Bonito MD [place]7/16/2019, 11:52 AM

## 2018-04-07 NOTE — Patient Instructions (Signed)
Great to see you!  Stop metformin, come back in 3 months to see Dr. Louanne Skyeettinger, Dr. Darlyn ReadStacks, or Dr. Nadine CountsGottschalk.

## 2018-04-08 LAB — MICROALBUMIN / CREATININE URINE RATIO
Creatinine, Urine: 278.2 mg/dL
MICROALB/CREAT RATIO: 20 mg/g{creat} (ref 0.0–30.0)
Microalbumin, Urine: 55.7 ug/mL

## 2018-04-09 ENCOUNTER — Ambulatory Visit: Payer: Medicaid Other | Admitting: *Deleted

## 2018-04-09 DIAGNOSIS — M25611 Stiffness of right shoulder, not elsewhere classified: Secondary | ICD-10-CM

## 2018-04-09 DIAGNOSIS — M25511 Pain in right shoulder: Secondary | ICD-10-CM | POA: Diagnosis not present

## 2018-04-09 NOTE — Therapy (Signed)
[facility] [address] [place], [place], [address] Phone: [phone]   Fax:  [phone]  Physical Therapy Treatment  Patient Details  Name: Chris Powell MRN: [id number] Date of Birth: 10-15-1965 Referring Provider: Teryl Lucy MD.   Encounter Date: 04/09/2018  PT End of Session - 04/09/18 1011    Visit Number  7    Number of Visits  16    Date for PT Re-Evaluation  05/12/18    Authorization Type  MCD approved 12 addtitional visits 7/10-8/20/19    Md 05-06-18    PT Start Time  0945    PT Stop Time  1035    PT Time Calculation (min)  50 min       Past Medical History:  Diagnosis Date  . Anemia   . Arthritis   . Asthma   . COPD (chronic obstructive pulmonary disease) (HCC)   . Coronary artery disease   . Depression   . Diabetes (HCC)   . GERD (gastroesophageal reflux disease)   . Hyperlipidemia   . Hypertension   . Myocardial infarction (HCC)   . Pneumonia    HX OF PNA  . Right rotator cuff tear 01/06/2018  . Shortness of breath dyspnea   . Sleep apnea    USES CPAP    Past Surgical History:  Procedure Laterality Date  . CARDIAC CATHETERIZATION  12/22/2014  . CORONARY ANGIOPLASTY    . CORONARY STENT PLACEMENT  12/22/2014   LAD  . KNEE SURGERY    . LEFT HEART CATH AND CORONARY ANGIOGRAPHY N/A 12/16/2017   Procedure: LEFT HEART CATH AND CORONARY ANGIOGRAPHY;  Surgeon: Swaziland, Peter M, MD;  Location: [facility] INVASIVE CV LAB;  Service: Cardiovascular;  Laterality: N/A;  . LEFT HEART CATHETERIZATION WITH CORONARY ANGIOGRAM N/A 12/22/2014   Procedure: LEFT HEART CATHETERIZATION WITH CORONARY ANGIOGRAM;  Surgeon: Corky Crafts, MD;  Location: [facility] CATH LAB;  Service: Cardiovascular;  Laterality: N/A;  . SHOULDER ARTHROSCOPY WITH ROTATOR CUFF REPAIR AND SUBACROMIAL DECOMPRESSION Right 01/06/2018   Procedure: RIGHT SHOULDER ARTHROSCOPY  DEBRIDEMENT,ACROMIOPLASTY, ROTATOR CUFF REPAIR;  Surgeon: Teryl Lucy, MD;  Location:  [facility] OR;  Service: Orthopedics;  Laterality: Right;    There were no vitals filed for this visit.  Subjective Assessment - 04/09/18 1004    Subjective  Patient reports 8/10 pain today in R shoulder. Same. Did good after last Rx    Pertinent History  COPD.    Patient Stated Goals  Use right arm again without pain.    Pain Score  8     Pain Location  Shoulder    Pain Orientation  Right    Pain Descriptors / Indicators  Aching;Sore                       [facility] Adult PT Treatment/Exercise - 04/09/18 0001      Exercises   Exercises  Shoulder      Shoulder Exercises: Supine   Protraction  Right;10 reps;20 reps    Flexion  AROM;20 reps partial ROM      Shoulder Exercises: Seated   Other Seated Exercises  UE ranger in sitting x 6 mins flexion and circles each way      Shoulder Exercises: Prone   Retraction  AROM;10 reps;20 reps    Extension  10 reps;20 reps      Shoulder Exercises: Pulleys   Flexion  -- 6 mins      Modalities   Modalities  Electrical  Stimulation      Programme researcher, broadcasting/film/video Location  Rt shldr IFC x 15 mins   80-150hz     Electrical Stimulation Goals  Pain      Manual Therapy   Manual Therapy  Passive ROM    Passive ROM  PROM for flexion to 160 degrees and ER  to 80 degrees in supine , Rhythmic stab for flexion and ER/IR                  PT Long Term Goals - 03/24/18 1026      PT LONG TERM GOAL #1   Title  Independent with a HEP.    Baseline  No knowledge of appropriate ther ex.    Time  8    Period  Weeks    Status  Achieved      PT LONG TERM GOAL #2   Title  Active right shoulder flexion to 145 degrees so the patient can easily reach overhead.    Baseline  Passive right shoulder flexion= 95 degrees.    Time  8    Period  Weeks    Status  On-going      PT LONG TERM GOAL #3   Title  Active ER to 70 degrees+ to allow for easily donning/doffing of apparel.    Baseline  ER= 35 degrees.    Time  8     Period  Weeks    Status  On-going      PT LONG TERM GOAL #4   Title  Increase right shoulder strength to a solid 4+/5 to increase stability for performance of functional activities.    Baseline  Strength not tested due to recent rotator cuff surgery.    Time  8    Period  Weeks    Status  On-going      PT LONG TERM GOAL #5   Title  Perform ADL's with pain not > 2-3/10.    Baseline  Patient unable to use right UE for ADL performance.    Time  8    Period  Weeks    Status  On-going            Plan - 04/09/18 1005    Clinical Impression Statement  Pt arrived today reporting doing well after last Rx. He did  well with added AROM exs in prone and supine today with mainly fatigue. He progressed to 160 degrees of flexion today and ER 80 degrees    Clinical Presentation  Stable    Clinical Decision Making  Low    Rehab Potential  Excellent    PT Frequency  2x / week    PT Duration  8 weeks    PT Next Visit Plan  Assess response to isometrics and progress strenghening; also scap stab       Patient will benefit from skilled therapeutic intervention in order to improve the following deficits and impairments:  Decreased activity tolerance, Pain, Decreased range of motion  Visit Diagnosis: Acute pain of right shoulder  Stiffness of right shoulder, not elsewhere classified     Problem List Patient Active Problem List   Diagnosis Date Noted  . Right rotator cuff tear 01/06/2018  . Angina pectoris (HCC) 12/16/2017  . Diabetes mellitus without complication (HCC) 11/27/2017  . Erectile dysfunction 09/05/2016  . Chronic pain syndrome 01/11/2016  . AC (acromioclavicular) arthritis 09/06/2015  . Sprain of right shoulder 09/06/2015  . Metabolic syndrome 08/29/2015  . Abdominal wall  pain 08/15/2015  . Disorder of ligament of right wrist 08/09/2015  . Ganglion of right wrist 08/09/2015  . Primary osteoarthritis of right hand 08/09/2015  . Right wrist pain 07/13/2015  . GERD  (gastroesophageal reflux disease) 02/28/2015  . Vitamin D deficiency 02/28/2015  . Hyperlipidemia 02/28/2015  . Chronic knee pain 02/28/2015  . Cocaine abuse (HCC) 01/25/2015  . Hx of non-ST elevation myocardial infarction (NSTEMI) 01/24/2015  . CAD - S/P LAD DES 3/11/22/14 12/23/2014  . Morbid obesity-BMI 51 12/23/2014  . COPD (chronic obstructive pulmonary disease) (HCC) 12/23/2014  . HTN (hypertension) 12/23/2014  . Smoking 12/23/2014  . Non-sustained ventricular tachycardia (HCC) 12/23/2014  . Hypokalemia 12/23/2014  . Acute coronary syndrome (HCC) 12/22/2014    Maddax Palinkas,CHRIS, PTA 04/09/2018, 11:22 AM  [facility] [address] [place], [place], [address] Phone: [phone]   Fax:  [phone]  Name: Lloyd HugerMaurice D Knape MRN: [id number] Date of Birth: 06/19/1966

## 2018-04-14 ENCOUNTER — Ambulatory Visit: Payer: Medicaid Other | Admitting: *Deleted

## 2018-04-14 DIAGNOSIS — M25611 Stiffness of right shoulder, not elsewhere classified: Secondary | ICD-10-CM

## 2018-04-14 DIAGNOSIS — M25511 Pain in right shoulder: Secondary | ICD-10-CM

## 2018-04-14 NOTE — Therapy (Signed)
[facility] [address] [place], [place], [address] Phone: [phone]   Fax:  [phone]  Physical Therapy Treatment  Patient Details  Name: Chris Powell MRN: [id number] Date of Birth: 01-27-1966 Referring Provider: Teryl Lucy MD.   Encounter Date: 04/14/2018  PT End of Session - 04/14/18 1014    Visit Number  8    Number of Visits  16    Date for PT Re-Evaluation  05/12/18    Authorization Type  MCD approved 12 addtitional visits 7/10-8/20/19    Md 05-06-18    PT Start Time  0957    PT Stop Time  1025 only 2 units as per Pt request to leave early    PT Time Calculation (min)  28 min       Past Medical History:  Diagnosis Date  . Anemia   . Arthritis   . Asthma   . COPD (chronic obstructive pulmonary disease) (HCC)   . Coronary artery disease   . Depression   . Diabetes (HCC)   . GERD (gastroesophageal reflux disease)   . Hyperlipidemia   . Hypertension   . Myocardial infarction (HCC)   . Pneumonia    HX OF PNA  . Right rotator cuff tear 01/06/2018  . Shortness of breath dyspnea   . Sleep apnea    USES CPAP    Past Surgical History:  Procedure Laterality Date  . CARDIAC CATHETERIZATION  12/22/2014  . CORONARY ANGIOPLASTY    . CORONARY STENT PLACEMENT  12/22/2014   LAD  . KNEE SURGERY    . LEFT HEART CATH AND CORONARY ANGIOGRAPHY N/A 12/16/2017   Procedure: LEFT HEART CATH AND CORONARY ANGIOGRAPHY;  Surgeon: Swaziland, Peter M, MD;  Location: [facility] INVASIVE CV LAB;  Service: Cardiovascular;  Laterality: N/A;  . LEFT HEART CATHETERIZATION WITH CORONARY ANGIOGRAM N/A 12/22/2014   Procedure: LEFT HEART CATHETERIZATION WITH CORONARY ANGIOGRAM;  Surgeon: Corky Crafts, MD;  Location: [facility] CATH LAB;  Service: Cardiovascular;  Laterality: N/A;  . SHOULDER ARTHROSCOPY WITH ROTATOR CUFF REPAIR AND SUBACROMIAL DECOMPRESSION Right 01/06/2018   Procedure: RIGHT SHOULDER ARTHROSCOPY  DEBRIDEMENT,ACROMIOPLASTY, ROTATOR CUFF  REPAIR;  Surgeon: Teryl Lucy, MD;  Location: [facility] OR;  Service: Orthopedics;  Laterality: Right;    There were no vitals filed for this visit.  Subjective Assessment - 04/14/18 1007    Subjective  Not doing well today and need to leave early. Hurting all over due to the weather.  RT shldr 10/10 today    Pertinent History  COPD.    Patient Stated Goals  Use right arm again without pain.    Currently in Pain?  Yes    Pain Score  10-Worst pain ever    Pain Location  Shoulder    Pain Orientation  Right    Pain Descriptors / Indicators  Aching;Sore    Pain Type  Surgical pain    Pain Onset  More than a month ago    Pain Frequency  Constant    Aggravating Factors   weather                       [facility] Adult PT Treatment/Exercise - 04/14/18 0001      Exercises   Exercises  Shoulder      Shoulder Exercises: Pulleys   Flexion  -- Elevation x 8 mins      Modalities   Modalities  Electrical Stimulation      Electrical Stimulation   Electrical  Stimulation Location  Rt shldr IFC x 15 mins   80-150hz     Electrical Stimulation Goals  Pain                  PT Long Term Goals - 03/24/18 1026      PT LONG TERM GOAL #1   Title  Independent with a HEP.    Baseline  No knowledge of appropriate ther ex.    Time  8    Period  Weeks    Status  Achieved      PT LONG TERM GOAL #2   Title  Active right shoulder flexion to 145 degrees so the patient can easily reach overhead.    Baseline  Passive right shoulder flexion= 95 degrees.    Time  8    Period  Weeks    Status  On-going      PT LONG TERM GOAL #3   Title  Active ER to 70 degrees+ to allow for easily donning/doffing of apparel.    Baseline  ER= 35 degrees.    Time  8    Period  Weeks    Status  On-going      PT LONG TERM GOAL #4   Title  Increase right shoulder strength to a solid 4+/5 to increase stability for performance of functional activities.    Baseline  Strength not tested due to recent  rotator cuff surgery.    Time  8    Period  Weeks    Status  On-going      PT LONG TERM GOAL #5   Title  Perform ADL's with pain not > 2-3/10.    Baseline  Patient unable to use right UE for ADL performance.    Time  8    Period  Weeks    Status  On-going            Plan - 04/14/18 1016    Clinical Impression Statement  Pt arrived today not feeling good all over due to pain secodary to rainy weather. He requested a short Rx due to having to leave early. Pt performed AAROM on pulleys for Elevation ROM and tolerated well. Estim was requested for pain relief in RT shldr and had a normal response. AAROM flexion to 160 degrees today..    Clinical Presentation  Stable    Rehab Potential  Excellent    PT Frequency  2x / week    PT Duration  8 weeks    PT Treatment/Interventions  ADLs/Self Care Home Management;Cryotherapy;Electrical Stimulation;Ultrasound;Moist Heat;Therapeutic activities;Therapeutic exercise;Neuromuscular re-education;Patient/family education;Passive range of motion;Manual techniques;Vasopneumatic Device    PT Next Visit Plan  Assess response to isometrics and progress strenghening; also scap stab    Consulted and Agree with Plan of Care  Patient       Patient will benefit from skilled therapeutic intervention in order to improve the following deficits and impairments:  Decreased activity tolerance, Pain, Decreased range of motion  Visit Diagnosis: Acute pain of right shoulder  Stiffness of right shoulder, not elsewhere classified     Problem List Patient Active Problem List   Diagnosis Date Noted  . Right rotator cuff tear 01/06/2018  . Angina pectoris (HCC) 12/16/2017  . Diabetes mellitus without complication (HCC) 11/27/2017  . Erectile dysfunction 09/05/2016  . Chronic pain syndrome 01/11/2016  . AC (acromioclavicular) arthritis 09/06/2015  . Sprain of right shoulder 09/06/2015  . Metabolic syndrome 08/29/2015  . Abdominal wall pain 08/15/2015  .  Disorder of  ligament of right wrist 08/09/2015  . Ganglion of right wrist 08/09/2015  . Primary osteoarthritis of right hand 08/09/2015  . Right wrist pain 07/13/2015  . GERD (gastroesophageal reflux disease) 02/28/2015  . Vitamin D deficiency 02/28/2015  . Hyperlipidemia 02/28/2015  . Chronic knee pain 02/28/2015  . Cocaine abuse (HCC) 01/25/2015  . Hx of non-ST elevation myocardial infarction (NSTEMI) 01/24/2015  . CAD - S/P LAD DES 3/11/22/14 12/23/2014  . Morbid obesity-BMI 51 12/23/2014  . COPD (chronic obstructive pulmonary disease) (HCC) 12/23/2014  . HTN (hypertension) 12/23/2014  . Smoking 12/23/2014  . Non-sustained ventricular tachycardia (HCC) 12/23/2014  . Hypokalemia 12/23/2014  . Acute coronary syndrome (HCC) 12/22/2014    Kmya Placide,CHRIS, PTA 04/14/2018, 10:29 AM  [facility] [address] [place], [place], [address] Phone: [phone]   Fax:  [phone]  Name: DEQUANDRE CORDOVA MRN: [id number] Date of Birth: 1966-02-12

## 2018-04-16 ENCOUNTER — Ambulatory Visit: Payer: Medicaid Other | Admitting: *Deleted

## 2018-04-16 DIAGNOSIS — M25511 Pain in right shoulder: Secondary | ICD-10-CM

## 2018-04-16 DIAGNOSIS — M25611 Stiffness of right shoulder, not elsewhere classified: Secondary | ICD-10-CM

## 2018-04-16 NOTE — Therapy (Signed)
[facility] [address] [place], [place], [address] Phone: [phone]   Fax:  [phone]  Physical Therapy Treatment  Patient Details  Name: Chris Powell MRN: [id number] Date of Birth: 1966/01/20 Referring Provider: Teryl Lucy MD.   Encounter Date: 04/16/2018  PT End of Session - 04/16/18 0954    Visit Number  9    Number of Visits  16    Date for PT Re-Evaluation  05/12/18    Authorization Type  MCD approved 12 addtitional visits 7/10-8/20/19    Md 05-06-18    PT Start Time  0945    PT Stop Time  1035    PT Time Calculation (min)  50 min       Past Medical History:  Diagnosis Date  . Anemia   . Arthritis   . Asthma   . COPD (chronic obstructive pulmonary disease) (HCC)   . Coronary artery disease   . Depression   . Diabetes (HCC)   . GERD (gastroesophageal reflux disease)   . Hyperlipidemia   . Hypertension   . Myocardial infarction (HCC)   . Pneumonia    HX OF PNA  . Right rotator cuff tear 01/06/2018  . Shortness of breath dyspnea   . Sleep apnea    USES CPAP    Past Surgical History:  Procedure Laterality Date  . CARDIAC CATHETERIZATION  12/22/2014  . CORONARY ANGIOPLASTY    . CORONARY STENT PLACEMENT  12/22/2014   LAD  . KNEE SURGERY    . LEFT HEART CATH AND CORONARY ANGIOGRAPHY N/A 12/16/2017   Procedure: LEFT HEART CATH AND CORONARY ANGIOGRAPHY;  Surgeon: Swaziland, Peter M, MD;  Location: [facility] INVASIVE CV LAB;  Service: Cardiovascular;  Laterality: N/A;  . LEFT HEART CATHETERIZATION WITH CORONARY ANGIOGRAM N/A 12/22/2014   Procedure: LEFT HEART CATHETERIZATION WITH CORONARY ANGIOGRAM;  Surgeon: Corky Crafts, MD;  Location: [facility] CATH LAB;  Service: Cardiovascular;  Laterality: N/A;  . SHOULDER ARTHROSCOPY WITH ROTATOR CUFF REPAIR AND SUBACROMIAL DECOMPRESSION Right 01/06/2018   Procedure: RIGHT SHOULDER ARTHROSCOPY  DEBRIDEMENT,ACROMIOPLASTY, ROTATOR CUFF REPAIR;  Surgeon: Teryl Lucy, MD;  [facility] OR;  Service: Orthopedics;  Laterality: Right;    There were no vitals filed for this visit.  Subjective Assessment - 04/16/18 1019    Subjective  Doing better today with less pain. Pleased with RT shldr progress.    Pertinent History  COPD.    Currently in Pain?  Yes    Pain Score  7     Pain Location  Shoulder    Pain Orientation  Right    Pain Descriptors / Indicators  Aching;Sore    Pain Type  Surgical pain    Pain Onset  More than a month ago    Pain Frequency  Constant                       [facility] Adult PT Treatment/Exercise - 04/16/18 0001      Exercises   Exercises  Shoulder      Shoulder Exercises: Standing   Protraction  20 reps;Theraband;AAROM;Strengthening;Left;Right;Both    Theraband Level (Shoulder Protraction)  Level 1 (Yellow)    External Rotation  Strengthening;Right;20 reps;Theraband    Theraband Level (Shoulder External Rotation)  Level 1 (Yellow)    Internal Rotation  Strengthening;Right;20 reps;Theraband    Theraband Level (Shoulder Internal Rotation)  Level 1 (Yellow)    Extension  Strengthening;Right;20 reps;Theraband    Theraband Level (Shoulder Extension)  Level  1 (Yellow)    Row  Strengthening;Right;20 reps;Theraband    Theraband Level (Shoulder Row)  Level 1 (Yellow)      Shoulder Exercises: Pulleys   Flexion  -- Elevation x 6 mins      Modalities   Modalities  Electrical Stimulation      Electrical Stimulation   Electrical Stimulation Location  Rt shldr IFC x 15 mins   80-150hz     Electrical Stimulation Goals  Pain      Manual Therapy   Manual Therapy  Passive ROM    Passive ROM  PROM for flexion to 162 degrees and ER  to 85 degrees in supine , Rhythmic stab for flexion and ER/IR                  PT Long Term Goals - 03/24/18 1026      PT LONG TERM GOAL #1   Title  Independent with a HEP.    Baseline  No knowledge of appropriate ther ex.    Time  8    Period  Weeks    Status  Achieved      PT LONG  TERM GOAL #2   Title  Active right shoulder flexion to 145 degrees so the patient can easily reach overhead.    Baseline  Passive right shoulder flexion= 95 degrees.    Time  8    Period  Weeks    Status  On-going      PT LONG TERM GOAL #3   Title  Active ER to 70 degrees+ to allow for easily donning/doffing of apparel.    Baseline  ER= 35 degrees.    Time  8    Period  Weeks    Status  On-going      PT LONG TERM GOAL #4   Title  Increase right shoulder strength to a solid 4+/5 to increase stability for performance of functional activities.    Baseline  Strength not tested due to recent rotator cuff surgery.    Time  8    Period  Weeks    Status  On-going      PT LONG TERM GOAL #5   Title  Perform ADL's with pain not > 2-3/10.    Baseline  Patient unable to use right UE for ADL performance.    Time  8    Period  Weeks    Status  On-going            Plan - 04/16/18 1021    Clinical Impression Statement  Pt arrived today doing better with decreased pain RT shldr and was able to progress to light tband strengthening with RW exs. Yellow tband and pictures given for HEP. PROM flexion 162 degrees and ER to 85 degrees. Normal modality respponse    Clinical Presentation  Stable    Rehab Potential  Excellent    PT Frequency  2x / week    PT Duration  8 weeks    PT Treatment/Interventions  ADLs/Self Care Home Management;Cryotherapy;Electrical Stimulation;Ultrasound;Moist Heat;Therapeutic activities;Therapeutic exercise;Neuromuscular re-education;Patient/family education;Passive range of motion;Manual techniques;Vasopneumatic Device    PT Next Visit Plan  Assess RW therex and review. Standing UE ranger    Consulted and Agree with Plan of Care  Patient       Patient will benefit from skilled therapeutic intervention in order to improve the following deficits and impairments:  Decreased activity tolerance, Pain, Decreased range of motion  Visit Diagnosis: Acute pain of right  shoulder  Stiffness of right shoulder, not elsewhere classified     Problem List Patient Active Problem List   Diagnosis Date Noted  . Right rotator cuff tear 01/06/2018  . Angina pectoris (HCC) 12/16/2017  . Diabetes mellitus without complication (HCC) 11/27/2017  . Erectile dysfunction 09/05/2016  . Chronic pain syndrome 01/11/2016  . AC (acromioclavicular) arthritis 09/06/2015  . Sprain of right shoulder 09/06/2015  . Metabolic syndrome 08/29/2015  . Abdominal wall pain 08/15/2015  . Disorder of ligament of right wrist 08/09/2015  . Ganglion of right wrist 08/09/2015  . Primary osteoarthritis of right hand 08/09/2015  . Right wrist pain 07/13/2015  . GERD (gastroesophageal reflux disease) 02/28/2015  . Vitamin D deficiency 02/28/2015  . Hyperlipidemia 02/28/2015  . Chronic knee pain 02/28/2015  . Cocaine abuse (HCC) 01/25/2015  . Hx of non-ST elevation myocardial infarction (NSTEMI) 01/24/2015  . CAD - S/P LAD DES 3/11/22/14 12/23/2014  . Morbid obesity-BMI 51 12/23/2014  . COPD (chronic obstructive pulmonary disease) (HCC) 12/23/2014  . HTN (hypertension) 12/23/2014  . Smoking 12/23/2014  . Non-sustained ventricular tachycardia (HCC) 12/23/2014  . Hypokalemia 12/23/2014  . Acute coronary syndrome (HCC) 12/22/2014    Ruthellen Tippy,CHRIS, PTA 04/16/2018, 11:23 AM  [facility] [address] [place], [place], [address] Phone: [phone]   Fax:  [phone]  Name: SLADE PIERPOINT MRN: [id number] Date of Birth: Feb 21, 1966

## 2018-04-20 ENCOUNTER — Encounter: Payer: Self-pay | Admitting: Internal Medicine

## 2018-04-20 ENCOUNTER — Ambulatory Visit: Payer: Medicaid Other | Admitting: Internal Medicine

## 2018-04-20 VITALS — BP 142/82 | HR 84 | Ht 67.0 in | Wt 353.0 lb

## 2018-04-20 DIAGNOSIS — R195 Other fecal abnormalities: Secondary | ICD-10-CM

## 2018-04-20 DIAGNOSIS — Z1211 Encounter for screening for malignant neoplasm of colon: Secondary | ICD-10-CM

## 2018-04-20 MED ORDER — NA SULFATE-K SULFATE-MG SULF 17.5-3.13-1.6 GM/177ML PO SOLN: 1.0000 | ORAL | 0 refills | Status: DC

## 2018-04-20 NOTE — Progress Notes (Signed)
HISTORY OF PRESENT ILLNESS:  Chris Powell is a 52 y.o. male with multiple medical problems including morbid obesity (BMI greater than 55), coronary artery disease, COPD, hypertension, hyperlipidemia, and chronic pain syndrome on chronic narcotics who is [facility] to this office [facility] regarding Hemoccult-positive stool and the need for colonoscopy. I have reviewed outside records. Reported to have a positive FIT stool study 02/02/2018. CBC from July 2018 shows hemoglobin 15.2. Or recent CBC 04/07/2018 shows hemoglobin of 15.2. Comprehensive metabolic panel unremarkable. No relevant abdominal imaging. Patient does have chronic reflux disease for which she takes omeprazole 20 mg daily. No dysphagia. He does have occasional breakthrough symptoms with dietary indiscretion. He also takes NSAIDs in addition to aspirin. GI review of systems is otherwise negative except for occasional diarrhea. He does take stool softeners to avoid narcotic related constipation. He has daily bowel movements. No bleeding. He is accompanied by his fiance. He denies prior history of GI evaluations. No family history of colon cancer. No relevant GI radiology  REVIEW OF SYSTEMS:  All non-GI ROS negative unless otherwise stated in the history of present illness except for arthritis, shortness of breath  Past Medical History:  Diagnosis Date  . Anemia   . Arthritis   . Asthma   . Bone spur of ankle    right   . COPD (chronic obstructive pulmonary disease) (HCC)   . Coronary artery disease   . Depression   . Diabetes (HCC)   . GERD (gastroesophageal reflux disease)   . Hyperlipidemia   . Hypertension   . Myocardial infarction (HCC)   . Pneumonia    HX OF PNA  . Right rotator cuff tear 01/06/2018  . Shortness of breath dyspnea   . Sleep apnea    USES CPAP    Past Surgical History:  Procedure Laterality Date  . CARDIAC CATHETERIZATION  12/22/2014  . CORONARY ANGIOPLASTY    . CORONARY  STENT PLACEMENT  12/22/2014   LAD  . KNEE SURGERY    . LEFT HEART CATH AND CORONARY ANGIOGRAPHY N/A 12/16/2017   Procedure: LEFT HEART CATH AND CORONARY ANGIOGRAPHY;  Surgeon: SwazilandJordan, Peter M, MD;  [facility];  Service: Cardiovascular;  Laterality: N/A;  . LEFT HEART CATHETERIZATION WITH CORONARY ANGIOGRAM N/A 12/22/2014   Procedure: LEFT HEART CATHETERIZATION WITH CORONARY ANGIOGRAM;  Surgeon: Corky CraftsJayadeep S Varanasi, MD;  [facility];  Service: Cardiovascular;  Laterality: N/A;  . SHOULDER ARTHROSCOPY WITH ROTATOR CUFF REPAIR AND SUBACROMIAL DECOMPRESSION Right 01/06/2018   Procedure: RIGHT SHOULDER ARTHROSCOPY  DEBRIDEMENT,ACROMIOPLASTY, ROTATOR CUFF REPAIR;  Surgeon: Teryl LucyLandau, Joshua, MD;  Location: [facility] OR;  Service: Orthopedics;  Laterality: Right;    Social History Chris Powell  reports that he has been smoking cigarettes.  He has a 15.00 pack-year smoking history. He has never used smokeless tobacco. He reports that he drinks alcohol. He reports that he has current or past drug history. Drug: Cocaine.  family history includes Cancer in his mother and sister; Diabetes in his father; Heart attack in his father; Hyperlipidemia in his mother; Hypertension in his mother.  No Known Allergies     PHYSICAL EXAMINATION: Vital signs: BP (!) 142/82   Pulse 84   Ht 5\' 7"  (1.702 m)   Wt (!) 353 lb (160.1 kg)   BMI 55.29 kg/m   Constitutional: obese, unhealthyl-appearing, no acute distress Psychiatric: alert and oriented x3, cooperative Eyes: extraocular movements intact, anicteric, conjunctiva pink Mouth: oral pharynx moist, no lesions  Neck: supple no lymphadenopathy Cardiovascular: heart regular rate and rhythm, no murmur Lungs: clear to auscultation bilaterally Abdomen: soft, obese, nontender, nondistended, no obvious ascites, no peritoneal signs, normal bowel sounds, no organomegaly Rectal:deferred until colonoscopy Extremities: no clubbing, cyanosis.Trace lower  extremity edema bilaterally Skin: no lesions on visible extremities Neuro: No focal deficits. Cranial nerves intact  ASSESSMENT:  #1. Positive FIT test. Rule out colon rectal neoplasia #2. Multiple significant medical problems #3. Morbid obesity   PLAN:  #1. Colonoscopy at the hospital with monitored anesthesia care. The patient is at very HIGH RISK given his cardiopulmonary comorbidities and body habitus.The nature of the procedure, as well as the risks, benefits, and alternatives were carefully and thoroughly reviewed with the patient. Ample time for discussion and questions allowed. The patient understood, was satisfied, and agreed to proceed.

## 2018-04-21 ENCOUNTER — Ambulatory Visit: Payer: Medicaid Other | Admitting: Physical Therapy

## 2018-04-22 ENCOUNTER — Ambulatory Visit: Payer: Medicaid Other | Admitting: Physical Therapy

## 2018-04-23 ENCOUNTER — Ambulatory Visit: Payer: Medicaid Other | Admitting: Physical Therapy

## 2018-04-28 ENCOUNTER — Encounter: Payer: Medicaid Other | Admitting: *Deleted

## 2018-04-30 ENCOUNTER — Encounter: Payer: Medicaid Other | Admitting: *Deleted

## 2018-05-01 ENCOUNTER — Ambulatory Visit: Payer: Medicaid Other | Admitting: *Deleted

## 2018-05-05 ENCOUNTER — Ambulatory Visit: Payer: Medicaid Other | Attending: Orthopedic Surgery | Admitting: *Deleted

## 2018-05-05 DIAGNOSIS — M25611 Stiffness of right shoulder, not elsewhere classified: Secondary | ICD-10-CM | POA: Insufficient documentation

## 2018-05-05 DIAGNOSIS — M25511 Pain in right shoulder: Secondary | ICD-10-CM | POA: Diagnosis present

## 2018-05-05 NOTE — Therapy (Addendum)
[facility] [place], [place], [address] Phone: [phone]   Fax:  [phone]  Physical Therapy Treatment  Patient Details  Name: Chris Powell MRN: [id number] Date of Birth: Oct 12, 1965 Referring Provider: Marchia Bond MD.   Encounter Date: 05/05/2018  PT End of Session - 05/05/18 0950    Visit Number  10    Number of Visits  16    Date for PT Re-Evaluation  05/12/18    Authorization Type  MCD approved 12 addtitional visits 7/10-8/20/19    Md 05-06-18    PT Start Time  0946    PT Stop Time  1031    PT Time Calculation (min)  45 min       Past Medical History:  Diagnosis Date  . Anemia   . Arthritis   . Asthma   . Bone spur of ankle    right   . COPD (chronic obstructive pulmonary disease) ([place])   . Coronary artery disease   . Depression   . Diabetes ([place])   . GERD (gastroesophageal reflux disease)   . Hyperlipidemia   . Hypertension   . Myocardial infarction ([place])   . Pneumonia    HX OF PNA  . Right rotator cuff tear 01/06/2018  . Shortness of breath dyspnea   . Sleep apnea    USES CPAP    Past Surgical History:  Procedure Laterality Date  . CARDIAC CATHETERIZATION  12/22/2014  . CORONARY ANGIOPLASTY    . CORONARY STENT PLACEMENT  12/22/2014   LAD  . KNEE SURGERY    . LEFT HEART CATH AND CORONARY ANGIOGRAPHY N/A 12/16/2017   Procedure: LEFT HEART CATH AND CORONARY ANGIOGRAPHY;  Surgeon: Martinique, Peter M, MD;  Location: [place] CV LAB;  Service: Cardiovascular;  Laterality: N/A;  . LEFT HEART CATHETERIZATION WITH CORONARY ANGIOGRAM N/A 12/22/2014   Procedure: LEFT HEART CATHETERIZATION WITH CORONARY ANGIOGRAM;  Surgeon: Jettie Booze, MD;  Location: [facility] CATH LAB;  Service: Cardiovascular;  Laterality: N/A;  . SHOULDER ARTHROSCOPY WITH ROTATOR CUFF REPAIR AND SUBACROMIAL DECOMPRESSION Right 01/06/2018   Procedure: RIGHT SHOULDER ARTHROSCOPY  DEBRIDEMENT,ACROMIOPLASTY, ROTATOR CUFF REPAIR;   Surgeon: Marchia Bond, MD;  Location: [place];  Service: Orthopedics;  Laterality: Right;    There were no vitals filed for this visit.  Subjective Assessment - 05/05/18 0948    Subjective  Rt shldr is doing better and have been able to use it a lot. 8/10. To MD tomorrow. I missed 2 weeks. I have been busy    Pertinent History  COPD.    Patient Stated Goals  Use right arm again without pain.    Currently in Pain?  Yes    Pain Score  8     Pain Location  Shoulder    Pain Orientation  Right    Pain Descriptors / Indicators  Aching;Sore    Pain Type  Surgical pain    Pain Onset  More than a month ago                       [facility] Adult PT Treatment/Exercise - 05/05/18 0001      Exercises   Exercises  Shoulder      Shoulder Exercises: Standing   Protraction  20 reps;Theraband;AAROM;Strengthening;Left;Right;Both    Theraband Level (Shoulder Protraction)  Level 1 (Yellow)    External Rotation  Strengthening;Right;20 reps;Theraband    Theraband Level (Shoulder External Rotation)  Level 1 (Yellow)    Internal  Rotation  Strengthening;Right;20 reps;Theraband    Theraband Level (Shoulder Internal Rotation)  Level 1 (Yellow)    Flexion  Strengthening;Right   3x10   Shoulder Flexion Weight (lbs)  2    Extension  Strengthening;Right;20 reps;Theraband    Theraband Level (Shoulder Extension)  Level 1 (Yellow)    Row  Strengthening;Right;20 reps;Theraband    Theraband Level (Shoulder Row)  Level 1 (Yellow)      Shoulder Exercises: Pulleys   Flexion  5 minutes      Modalities   Modalities  Electrical Stimulation      Electrical Stimulation   Electrical Stimulation Location  Rt shldr IFC x 15 mins   80-_0     Electrical Stimulation Goals  Pain     towel behind back stretch             PT Long Term Goals - 03/24/18 1026      PT LONG TERM GOAL #1   Title  Independent with a HEP.    Baseline  No knowledge of appropriate ther ex.    Time  8    Period  Weeks     Status  Achieved      PT LONG TERM GOAL #2   Title  Active right shoulder flexion to 145 degrees so the patient can easily reach overhead.    Baseline  Passive right shoulder flexion= 95 degrees.    Time  8    Period  Weeks    Status  On-going      PT LONG TERM GOAL #3   Title  Active ER to 70 degrees+ to allow for easily donning/doffing of apparel.    Baseline  ER= 35 degrees.    Time  8    Period  Weeks    Status  On-going      PT LONG TERM GOAL #4   Title  Increase right shoulder strength to a solid 4+/5 to increase stability for performance of functional activities.    Baseline  Strength not tested due to recent rotator cuff surgery.    Time  8    Period  Weeks    Status  On-going      PT LONG TERM GOAL #5   Title  Perform ADL's with pain not > 2-3/10.    Baseline  Patient unable to use right UE for ADL performance.    Time  8    Period  Weeks    Status  On-going            Plan - 05/05/18 0951    Clinical Impression Statement  Pt arrived today doing fairly well with RT shldr. He did well with light strengthening Ex.'s and was able to complete with no increase in pain and mainly fatigue. His ROM is mostly  WFL's now  with Elevation to 160 degrees now and ER to 80 degrees. He still has a deficit with reaching behind his back which is to RT SIJ at this time. He will F/U with MD tomorrow and has 2 visits left before 05-12-18 to continue with strengthening.    Clinical Presentation  Stable    Clinical Decision Making  Low    Rehab Potential  Excellent    PT Frequency  2x / week    PT Duration  8 weeks    PT Treatment/Interventions  ADLs/Self Care Home Management;Cryotherapy;Electrical Stimulation;Ultrasound;Moist Heat;Therapeutic activities;Therapeutic exercise;Neuromuscular re-education;Patient/family education;Passive range of motion;Manual techniques;Vasopneumatic Device    PT Next Visit Plan  Send MD note  Consulted and Agree with Plan of Care  Patient        Patient will benefit from skilled therapeutic intervention in order to improve the following deficits and impairments:  Decreased activity tolerance, Pain, Decreased range of motion  Visit Diagnosis: Acute pain of right shoulder  Stiffness of right shoulder, not elsewhere classified     Problem List Patient Active Problem List   Diagnosis Date Noted  . Right rotator cuff tear 01/06/2018  . Angina pectoris ([place]) 12/16/2017  . Diabetes mellitus without complication ([place]) 08/65/7846  . Erectile dysfunction 09/05/2016  . Chronic pain syndrome 01/11/2016  . AC (acromioclavicular) arthritis 09/06/2015  . Sprain of right shoulder 09/06/2015  . Metabolic syndrome 96/29/5284  . Abdominal wall pain 08/15/2015  . Disorder of ligament of right wrist 08/09/2015  . Ganglion of right wrist 08/09/2015  . Primary osteoarthritis of right hand 08/09/2015  . Right wrist pain 07/13/2015  . GERD (gastroesophageal reflux disease) 02/28/2015  . Vitamin D deficiency 02/28/2015  . Hyperlipidemia 02/28/2015  . Chronic knee pain 02/28/2015  . Cocaine abuse ([place]) 01/25/2015  . Hx of non-ST elevation myocardial infarction (NSTEMI) 01/24/2015  . CAD - S/P LAD DES 3/11/22/14 12/23/2014  . Morbid obesity-BMI 51 12/23/2014  . COPD (chronic obstructive pulmonary disease) ([place]) 12/23/2014  . HTN (hypertension) 12/23/2014  . Smoking 12/23/2014  . Non-sustained ventricular tachycardia ([place]) 12/23/2014  . Hypokalemia 12/23/2014  . Acute coronary syndrome ([place]) 12/22/2014    RAMSEUR,CHRIS, PTA 05/05/2018, 10:32 AM  [facility] [place], [place], [address] Phone: [phone]   Fax:  [phone]  Name: Chris Powell MRN: [id number] Date of Birth: 09-Nov-1965  PHYSICAL THERAPY DISCHARGE SUMMARY  Visits from Start of Care: 10.  Current functional level related to goals / functional outcomes: See above.   Remaining deficits: Continued right  shoulder pain.  Goals unmet.   Education / Equipment: HEP. Plan: Patient agrees to discharge.  Patient goals were not met. Patient is being discharged due to not returning since the last visit.  ?????          Mali Applegate MPT

## 2018-05-07 ENCOUNTER — Encounter: Payer: Medicaid Other | Admitting: *Deleted

## 2018-05-11 ENCOUNTER — Encounter: Payer: Medicaid Other | Admitting: Physical Therapy

## 2018-05-12 ENCOUNTER — Ambulatory Visit: Payer: Medicaid Other | Admitting: *Deleted

## 2018-05-18 ENCOUNTER — Encounter: Payer: Medicaid Other | Admitting: Family Medicine

## 2018-06-01 ENCOUNTER — Encounter: Payer: Medicaid Other | Admitting: Family Medicine

## 2018-06-01 NOTE — Progress Notes (Deleted)
Chris Powell is a 52 y.o. male presents to office today for annual physical exam examination.    Concerns today include: 1. ***  Occupation: ***, Marital status: ***, Substance use: *** Diet: ***, Exercise: *** Last eye exam: *** Last dental exam: *** Last colonoscopy: *** Refills needed today: *** Immunizations needed: Flu, PNA  Past Medical History:  Diagnosis Date  . Anemia   . Arthritis   . Asthma   . Bone spur of ankle    right   . COPD (chronic obstructive pulmonary disease) ([place])   . Coronary artery disease   . Depression   . Diabetes ([place])   . GERD (gastroesophageal reflux disease)   . Hyperlipidemia   . Hypertension   . Myocardial infarction ([place])   . Pneumonia    HX OF PNA  . Right rotator cuff tear 01/06/2018  . Shortness of breath dyspnea   . Sleep apnea    USES CPAP   Social History   Socioeconomic History  . Marital status: Legally Separated    Spouse name: Not on file  . Number of children: Not on file  . Years of education: Not on file  . Highest education level: Not on file  Occupational History  . Not on file  Social Needs  . Financial resource strain: Not on file  . Food insecurity:    Worry: Not on file    Inability: Not on file  . Transportation needs:    Medical: Not on file    Non-medical: Not on file  Tobacco Use  . Smoking status: Current Every Day Smoker    Packs/day: 0.50    Years: 30.00    Pack years: 15.00    Types: Cigarettes  . Smokeless tobacco: Never Used  . Tobacco comment: " I have  tried vapor cigarettes"  Substance and Sexual Activity  . Alcohol use: Yes    Alcohol/week: 0.0 standard drinks    Comment: seldom  . Drug use: Not Currently    Types: Cocaine    Comment: Denies any recent use.  Pt has a Pain Contract. last use was 2016  . Sexual activity: Not on file  Lifestyle  . Physical activity:    Days per week: Not on file    Minutes per session: Not on file  . Stress: Not on file  Relationships  .  Social connections:    Talks on phone: Not on file    Gets together: Not on file    Attends religious service: Not on file    Active member of club or organization: Not on file    Attends meetings of clubs or organizations: Not on file    Relationship status: Not on file  . Intimate partner violence:    Fear of current or ex partner: Not on file    Emotionally abused: Not on file    Physically abused: Not on file    Forced sexual activity: Not on file  Other Topics Concern  . Not on file  Social History Narrative  . Not on file   Past Surgical History:  Procedure Laterality Date  . CARDIAC CATHETERIZATION  12/22/2014  . CORONARY ANGIOPLASTY    . CORONARY STENT PLACEMENT  12/22/2014   LAD  . KNEE SURGERY    . LEFT HEART CATH AND CORONARY ANGIOGRAPHY N/A 12/16/2017   Procedure: LEFT HEART CATH AND CORONARY ANGIOGRAPHY;  Surgeon: Martinique, Peter M, MD;  Location: [place] CV LAB;  Service: Cardiovascular;  Laterality: N/A;  . LEFT HEART CATHETERIZATION WITH CORONARY ANGIOGRAM N/A 12/22/2014   Procedure: LEFT HEART CATHETERIZATION WITH CORONARY ANGIOGRAM;  Surgeon: Jettie Booze, MD;  Location: [facility] CATH LAB;  Service: Cardiovascular;  Laterality: N/A;  . SHOULDER ARTHROSCOPY WITH ROTATOR CUFF REPAIR AND SUBACROMIAL DECOMPRESSION Right 01/06/2018   Procedure: RIGHT SHOULDER ARTHROSCOPY  DEBRIDEMENT,ACROMIOPLASTY, ROTATOR CUFF REPAIR;  Surgeon: Marchia Bond, MD;  Location: [place];  Service: Orthopedics;  Laterality: Right;   Family History  Problem Relation Age of Onset  . Heart attack Father   . Diabetes Father   . Cancer Mother        leukemia  . Hypertension Mother   . Hyperlipidemia Mother   . Cancer Sister   . Colon cancer Neg Hx     Current Outpatient Medications:  .  albuterol (PROVENTIL HFA;VENTOLIN HFA) 108 (90 Base) MCG/ACT inhaler, Inhale 2 puffs into the lungs every 6 (six) hours as needed for wheezing or shortness of breath., Disp: 2 Inhaler, Rfl: 2 .   amLODipine (NORVASC) 10 MG tablet, Take 1 tablet (10 mg total) by mouth daily., Disp: 90 tablet, Rfl: 3 .  aspirin EC 81 MG tablet, Take 81 mg by mouth daily., Disp: , Rfl:  .  atorvastatin (LIPITOR) 80 MG tablet, Take 1 tablet (80 mg total) by mouth daily at 6 PM., Disp: 90 tablet, Rfl: 3 .  Cholecalciferol 2000 UNITS CAPS, Take 2,000 Units by mouth daily. , Disp: , Rfl:  .  cloNIDine (CATAPRES) 0.3 MG tablet, Take 0.3 mg by mouth 2 (two) times daily. , Disp: , Rfl:  .  fluticasone (FLONASE) 50 MCG/ACT nasal spray, Place 2 sprays into both nostrils daily., Disp: 16 g, Rfl: 6 .  furosemide (LASIX) 40 MG tablet, Take 1 tablet (40 mg total) by mouth daily., Disp: 30 tablet, Rfl: 11 .  gabapentin (NEURONTIN) 600 MG tablet, Take 1 tablet (600 mg total) by mouth 3 (three) times daily., Disp: 90 tablet, Rfl: 3 .  hydrALAZINE (APRESOLINE) 50 MG tablet, TAKE 1 TABLET (50 MG TOTAL) BY MOUTH 3 (THREE) TIMES DAILY., Disp: 90 tablet, Rfl: 7 .  ibuprofen (ADVIL,MOTRIN) 800 MG tablet, TAKE 1 TABLET (800 MG TOTAL) BY MOUTH EVERY 8 (EIGHT) HOURS AS NEEDED., Disp: 30 tablet, Rfl: 0 .  lisinopril (PRINIVIL,ZESTRIL) 40 MG tablet, Take 1 tablet (40 mg total) by mouth daily., Disp: 90 tablet, Rfl: 3 .  mometasone (NASONEX) 50 MCG/ACT nasal spray, Place 2 sprays into the nose daily as needed (for allergies). , Disp: , Rfl:  .  Na Sulfate-K Sulfate-Mg Sulf (SUPREP BOWEL PREP KIT) 17.5-3.13-1.6 GM/177ML SOLN, Take 1 kit by mouth as directed., Disp: 324 mL, Rfl: 0 .  naloxone (NARCAN) nasal spray 4 mg/0.1 mL, Place 1 spray into the nose as directed., Disp: , Rfl:  .  nitroGLYCERIN (NITROSTAT) 0.4 MG SL tablet, Place 1 tablet (0.4 mg total) under the tongue every 5 (five) minutes as needed for chest pain., Disp: 25 tablet, Rfl: 2 .  omeprazole (PRILOSEC) 20 MG capsule, Take 20 mg by mouth daily., Disp: , Rfl:  .  Oxycodone HCl 10 MG TABS, Take 1-2 tablets (10-20 mg total) by mouth 4 (four) times daily as needed (for pain).,  Disp: 60 tablet, Rfl: 0 .  potassium chloride SA (K-DUR,KLOR-CON) 20 MEQ tablet, Take 20 mEq by mouth daily., Disp: , Rfl:  .  sildenafil (REVATIO) 20 MG tablet, TAKE 2-5 TABLETS AS NEEDED PRIOR TO SEXUAL ACTIVITY, Disp: 50 tablet, Rfl: 6 .  spironolactone (  ALDACTONE) 50 MG tablet, Take 1 tablet (50 mg total) by mouth daily., Disp: 90 tablet, Rfl: 3  No Known Allergies   ROS: Review of Systems {ros; complete:30496}    Physical exam {Exam, Complete:[phone]}    Assessment/ Plan: Barrie Lyme here for annual physical exam.   No problem-specific Assessment & Plan notes found for this encounter.   Counseled on healthy lifestyle choices, including diet (rich in fruits, vegetables and lean meats and low in salt and simple carbohydrates) and exercise (at least 30 minutes of moderate physical activity daily).  Patient to follow up in 1 year for annual exam or sooner if needed.  Ashly M. Lajuana Ripple, DO

## 2018-06-15 ENCOUNTER — Other Ambulatory Visit: Payer: Self-pay

## 2018-06-15 ENCOUNTER — Encounter (HOSPITAL_COMMUNITY): Payer: Self-pay | Admitting: *Deleted

## 2018-06-16 ENCOUNTER — Encounter (HOSPITAL_COMMUNITY): Payer: Self-pay | Admitting: Anesthesiology

## 2018-06-16 ENCOUNTER — Encounter (HOSPITAL_COMMUNITY)
Admission: RE | Disposition: A | Discharge status: Home / Self Care | Payer: Self-pay | Source: Ambulatory Visit | Attending: Internal Medicine

## 2018-06-16 ENCOUNTER — Ambulatory Visit (HOSPITAL_COMMUNITY)
Admission: RE | Admit: 2018-06-16 | Discharge: 2018-06-16 | Disposition: A | Discharge status: Home / Self Care | Payer: Medicaid Other | Source: Ambulatory Visit | Attending: Internal Medicine | Admitting: Internal Medicine

## 2018-06-16 DIAGNOSIS — Z5309 Procedure and treatment not carried out because of other contraindication: Secondary | ICD-10-CM | POA: Insufficient documentation

## 2018-06-16 DIAGNOSIS — Z1211 Encounter for screening for malignant neoplasm of colon: Secondary | ICD-10-CM | POA: Insufficient documentation

## 2018-06-16 SURGERY — CANCELLED PROCEDURE

## 2018-06-16 MED ORDER — LACTATED RINGERS IV SOLN: INTRAVENOUS | Status: DC

## 2018-06-16 SURGICAL SUPPLY — 21 items

## 2018-06-16 NOTE — Progress Notes (Signed)
During pre-procedure, patient came in eating a peppermint patty.  He also stated that he had solid food up until 2pm yesterday and was still having solid matter in his dark brown liquid stool.  Dr. Marina GoodellPerry cancelled procedure and explained to patient.  Patient to reschedule with office.  Roselie AwkwardShannon Shaneque Merkle, RN

## 2018-06-17 ENCOUNTER — Other Ambulatory Visit: Payer: Self-pay

## 2018-06-17 ENCOUNTER — Telehealth: Payer: Self-pay

## 2018-06-17 DIAGNOSIS — R195 Other fecal abnormalities: Secondary | ICD-10-CM

## 2018-06-17 NOTE — Telephone Encounter (Signed)
-----   Message from Hilarie Fredrickson, MD sent at 06/16/2018  9:09 AM EDT ----- Regarding: Patient's colonoscopy cancelled at [facility], The patient's case was cancelled as he ate solids yesterday, prep not clear, and had a snack this am. Please arrange a previsit to reschedule his colonoscopy at the hospital. Convert to phone note. Thanks.

## 2018-06-17 NOTE — Telephone Encounter (Signed)
Spoke with pt and let him know the next hospital day at [facility] is 08/10/18. Let him know we will contact hospital and call him back regarding appt.

## 2018-06-17 NOTE — Telephone Encounter (Signed)
Pt scheduled for previsit 07/27/18@11am , Colon rescheduled at [facility] 08/10/18@8 :30am. Pt aware.

## 2018-07-22 ENCOUNTER — Ambulatory Visit: Payer: Medicaid Other | Admitting: Family Medicine

## 2018-07-27 ENCOUNTER — Telehealth: Payer: Self-pay | Admitting: Family Medicine

## 2018-07-27 NOTE — Telephone Encounter (Signed)
appt scheduled for evaluation 

## 2018-07-28 ENCOUNTER — Encounter: Payer: Self-pay | Admitting: Nurse Practitioner

## 2018-07-28 ENCOUNTER — Ambulatory Visit: Payer: Medicaid Other | Admitting: Nurse Practitioner

## 2018-07-28 VITALS — BP 165/95 | HR 90 | Temp 98.7°F | Ht 67.0 in | Wt 337.0 lb

## 2018-07-28 DIAGNOSIS — L600 Ingrowing nail: Secondary | ICD-10-CM | POA: Diagnosis not present

## 2018-07-28 NOTE — Patient Instructions (Signed)
Ingrown Toenail An ingrown toenail occurs when the corner or sides of your toenail grow into the surrounding skin. The big toe is most commonly affected, but it can happen to any of your toes. If your ingrown toenail is not treated, you will be at risk for infection. What are the causes? This condition may be caused by:  Wearing shoes that are too small or tight.  Injury or trauma, such as stubbing your toe or having your toe stepped on.  Improper cutting or care of your toenails.  Being born with (congenital) nail or foot abnormalities, such as having a nail that is too big for your toe.  What increases the risk? Risk factors for an ingrown toenail include:  Age. Your nails tend to thicken as you get older, so ingrown nails are more common in older people.  Diabetes.  Cutting your toenails incorrectly.  Blood circulation problems.  What are the signs or symptoms? Symptoms may include:  Pain, soreness, or tenderness.  Redness.  Swelling.  Hardening of the skin surrounding the toe.  Your ingrown toenail may be infected if there is fluid, pus, or drainage. How is this diagnosed? An ingrown toenail may be diagnosed by medical history and physical exam. If your toenail is infected, your health care provider may test a sample of the drainage. How is this treated? Treatment depends on the severity of your ingrown toenail. Some ingrown toenails may be treated at home. More severe or infected ingrown toenails may require surgery to remove all or part of the nail. Infected ingrown toenails may also be treated with antibiotic medicines. Follow these instructions at home:  If you were prescribed an antibiotic medicine, finish all of it even if you start to feel better.  Soak your foot in warm soapy water for 20 minutes, 3 times per day or as directed by your health care provider.  Carefully lift the edge of the nail away from the sore skin by wedging a small piece of cotton under  the corner of the nail. This may help with the pain. Be careful not to cause more injury to the area.  Wear shoes that fit well. If your ingrown toenail is causing you pain, try wearing sandals, if possible.  Trim your toenails regularly and carefully. Do not cut them in a curved shape. Cut your toenails straight across. This prevents injury to the skin at the corners of the toenail.  Keep your feet clean and dry.  If you are having trouble walking and are given crutches by your health care provider, use them as directed.  Do not pick at your toenail or try to remove it yourself.  Take medicines only as directed by your health care provider.  Keep all follow-up visits as directed by your health care provider. This is important. Contact a health care provider if:  Your symptoms do not improve with treatment. Get help right away if:  You have red streaks that start at your foot and go up your leg.  You have a fever.  You have increased redness, swelling, or pain.  You have fluid, blood, or pus coming from your toenail. This information is not intended to replace advice given to you by your health care provider. Make sure you discuss any questions you have with your health care provider. Document Released: 09/06/2000 Document Revised: 02/09/2016 Document Reviewed: 08/03/2014 Elsevier Interactive Patient Education  2018 [employer]  

## 2018-07-28 NOTE — Progress Notes (Signed)
   Subjective:    Patient ID: Chris Powell, male    DOB: 07-Dec-1965, 52 y.o.   MRN: [id number]  Toe Pain   The incident occurred more than 1 week ago. There was no injury mechanism. The pain is present in the right toes. The pain is at a severity of 2/10. The pain is mild. The pain has been constant since onset. The symptoms are aggravated by movement. He has tried rest for the symptoms. The treatment provided mild relief.      Review of Systems  Constitutional: Negative.   Respiratory: Negative.   Cardiovascular: Negative.   Psychiatric/Behavioral: Negative.   All other systems reviewed and are negative.      Objective:   Physical Exam  Constitutional: He is oriented to person, place, and time. He appears well-developed and well-nourished.  Cardiovascular: Normal rate and regular rhythm.  Pulmonary/Chest: Effort normal.  Neurological: He is alert and oriented to person, place, and time.  Skin: Skin is warm.  Medial nail border of right great toe ingrown- no sign of infection   BP (!) 165/95   Pulse 90   Temp 98.7 F (37.1 C) (Oral)   Ht 5\' 7"  (1.702 m)   Wt (!) 337 lb (152.9 kg)   BMI 52.78 kg/m   Wedging procedure done       Assessment & Plan:  Chris Powell in today with chief complaint of Right foot big toe   1. Ingrown toenail Wedging demonstrated Change cotton for wedge daily RTO prn  Mary-Margaret Daphine Deutscher, FNP

## 2018-07-30 ENCOUNTER — Encounter: Payer: Self-pay | Admitting: Family Medicine

## 2018-08-04 ENCOUNTER — Telehealth: Payer: Self-pay

## 2018-08-04 NOTE — Telephone Encounter (Signed)
Pt had a no show today for previsit. Pt verbalize he is going through some things at this time and wants to cancel his procedure at this time. Advised to call back at a time that's convenientfor him.

## 2018-08-05 ENCOUNTER — Telehealth: Payer: Self-pay

## 2018-08-05 NOTE — Telephone Encounter (Signed)
[facility] and cancelled his procedure for 08/10/2018

## 2018-08-05 NOTE — Telephone Encounter (Signed)
-----   Message from Melene PlanMichelle T Polk-Jones, RN sent at 08/04/2018 11:38 AM EST ----- Pt was a no show in previsit x 2. Called pt today and he wants to cancel procedure at this time. Pt verbalize having a lot going on right now. He will call us back to reschedule.

## 2018-08-10 ENCOUNTER — Encounter (HOSPITAL_COMMUNITY): Payer: Self-pay

## 2018-08-10 ENCOUNTER — Ambulatory Visit (HOSPITAL_COMMUNITY): Admit: 2018-08-10 | Payer: Medicaid Other | Admitting: Internal Medicine

## 2018-08-10 SURGERY — COLONOSCOPY WITH PROPOFOL
Anesthesia: Monitor Anesthesia Care

## 2018-10-05 ENCOUNTER — Emergency Department (HOSPITAL_COMMUNITY): Payer: Medicaid Other

## 2018-10-05 ENCOUNTER — Encounter (HOSPITAL_COMMUNITY): Payer: Self-pay

## 2018-10-05 ENCOUNTER — Observation Stay (HOSPITAL_COMMUNITY)
Admission: EM | Admit: 2018-10-05 | Discharge: 2018-10-06 | Disposition: A | Discharge status: Home / Self Care | Payer: Medicaid Other | Attending: Internal Medicine | Admitting: Internal Medicine

## 2018-10-05 ENCOUNTER — Other Ambulatory Visit: Payer: Self-pay

## 2018-10-05 DIAGNOSIS — Z79899 Other long term (current) drug therapy: Secondary | ICD-10-CM | POA: Diagnosis not present

## 2018-10-05 DIAGNOSIS — R0602 Shortness of breath: Secondary | ICD-10-CM | POA: Diagnosis present

## 2018-10-05 DIAGNOSIS — G894 Chronic pain syndrome: Secondary | ICD-10-CM | POA: Diagnosis present

## 2018-10-05 DIAGNOSIS — E1129 Type 2 diabetes mellitus with other diabetic kidney complication: Secondary | ICD-10-CM

## 2018-10-05 DIAGNOSIS — R0600 Dyspnea, unspecified: Secondary | ICD-10-CM | POA: Diagnosis not present

## 2018-10-05 DIAGNOSIS — E119 Type 2 diabetes mellitus without complications: Secondary | ICD-10-CM

## 2018-10-05 DIAGNOSIS — E785 Hyperlipidemia, unspecified: Secondary | ICD-10-CM | POA: Diagnosis not present

## 2018-10-05 DIAGNOSIS — Z7982 Long term (current) use of aspirin: Secondary | ICD-10-CM | POA: Insufficient documentation

## 2018-10-05 DIAGNOSIS — E782 Mixed hyperlipidemia: Secondary | ICD-10-CM | POA: Diagnosis present

## 2018-10-05 DIAGNOSIS — J441 Chronic obstructive pulmonary disease with (acute) exacerbation: Secondary | ICD-10-CM | POA: Diagnosis present

## 2018-10-05 DIAGNOSIS — I10 Essential (primary) hypertension: Secondary | ICD-10-CM | POA: Diagnosis present

## 2018-10-05 DIAGNOSIS — F17210 Nicotine dependence, cigarettes, uncomplicated: Secondary | ICD-10-CM | POA: Diagnosis not present

## 2018-10-05 DIAGNOSIS — J42 Unspecified chronic bronchitis: Secondary | ICD-10-CM

## 2018-10-05 DIAGNOSIS — I252 Old myocardial infarction: Secondary | ICD-10-CM

## 2018-10-05 DIAGNOSIS — I110 Hypertensive heart disease with heart failure: Secondary | ICD-10-CM | POA: Diagnosis not present

## 2018-10-05 DIAGNOSIS — Z23 Encounter for immunization: Secondary | ICD-10-CM | POA: Diagnosis not present

## 2018-10-05 DIAGNOSIS — I249 Acute ischemic heart disease, unspecified: Secondary | ICD-10-CM

## 2018-10-05 DIAGNOSIS — I2510 Atherosclerotic heart disease of native coronary artery without angina pectoris: Secondary | ICD-10-CM | POA: Diagnosis not present

## 2018-10-05 DIAGNOSIS — J449 Chronic obstructive pulmonary disease, unspecified: Secondary | ICD-10-CM | POA: Diagnosis not present

## 2018-10-05 DIAGNOSIS — R0789 Other chest pain: Secondary | ICD-10-CM | POA: Insufficient documentation

## 2018-10-05 DIAGNOSIS — J45909 Unspecified asthma, uncomplicated: Secondary | ICD-10-CM | POA: Insufficient documentation

## 2018-10-05 DIAGNOSIS — I5032 Chronic diastolic (congestive) heart failure: Secondary | ICD-10-CM | POA: Diagnosis not present

## 2018-10-05 DIAGNOSIS — J069 Acute upper respiratory infection, unspecified: Secondary | ICD-10-CM

## 2018-10-05 DIAGNOSIS — Z9861 Coronary angioplasty status: Secondary | ICD-10-CM

## 2018-10-05 LAB — COMPREHENSIVE METABOLIC PANEL
ALT: 42 U/L (ref 0–44)
ANION GAP: 8 (ref 5–15)
AST: 58 U/L — ABNORMAL HIGH (ref 15–41)
Albumin: 3.3 g/dL — ABNORMAL LOW (ref 3.5–5.0)
Alkaline Phosphatase: 66 U/L (ref 38–126)
BUN: 12 mg/dL (ref 6–20)
CO2: 27 mmol/L (ref 22–32)
Calcium: 8.4 mg/dL — ABNORMAL LOW (ref 8.9–10.3)
Chloride: 104 mmol/L (ref 98–111)
Creatinine, Ser: 1.13 mg/dL (ref 0.61–1.24)
GFR calc Af Amer: >60 mL/min (ref >60)
GFR calc non Af Amer: >60 mL/min (ref >60)
Glucose, Bld: 114 mg/dL — ABNORMAL HIGH (ref 70–99)
Potassium: 3.2 mmol/L — ABNORMAL LOW (ref 3.5–5.1)
Sodium: 139 mmol/L (ref 135–145)
Total Bilirubin: 0.4 mg/dL (ref 0.3–1.2)
Total Protein: 7.3 g/dL (ref 6.5–8.1)

## 2018-10-05 LAB — CBC WITH DIFFERENTIAL/PLATELET
Abs Immature Granulocytes: 0.03 10*3/uL (ref 0.00–0.07)
Basophils Absolute: 0.1 10*3/uL (ref 0.0–0.1)
Basophils Relative: 1 %
Eosinophils Absolute: 0.1 10*3/uL (ref 0.0–0.5)
Eosinophils Relative: 1 %
HCT: 45.3 % (ref 39.0–52.0)
Hemoglobin: 14 g/dL (ref 13.0–17.0)
Immature Granulocytes: 0 %
Lymphocytes Relative: 17 %
Lymphs Abs: 1.6 10*3/uL (ref 0.7–4.0)
MCH: 28.1 pg (ref 26.0–34.0)
MCHC: 30.9 g/dL (ref 30.0–36.0)
MCV: 90.8 fL (ref 80.0–100.0)
Monocytes Absolute: 0.6 10*3/uL (ref 0.1–1.0)
Monocytes Relative: 6 %
Neutro Abs: 6.7 10*3/uL (ref 1.7–7.7)
Neutrophils Relative %: 75 %
Platelets: 244 10*3/uL (ref 150–400)
RBC: 4.99 MIL/uL (ref 4.22–5.81)
RDW: 14.8 % (ref 11.5–15.5)
WBC: 9.1 10*3/uL (ref 4.0–10.5)
nRBC: 0 % (ref 0.0–0.2)

## 2018-10-05 LAB — GLUCOSE, CAPILLARY
Glucose-Capillary: 244 mg/dL — ABNORMAL HIGH (ref 70–99)
Glucose-Capillary: 188 mg/dL — ABNORMAL HIGH (ref 70–99)

## 2018-10-05 LAB — TROPONIN I
Troponin I: 0.08 ng/mL (ref <0.03)
Troponin I: 0.07 ng/mL (ref <0.03)

## 2018-10-05 LAB — BRAIN NATRIURETIC PEPTIDE: B Natriuretic Peptide: 294 pg/mL — ABNORMAL HIGH (ref 0.0–100.0)

## 2018-10-05 MED ORDER — ASPIRIN EC 81 MG PO TBEC
81.0000 mg | DELAYED_RELEASE_TABLET | Freq: Every day | ORAL | Status: DC
  Administered 2018-10-06: 81 mg via ORAL
  Filled 2018-10-05: qty 1

## 2018-10-05 MED ORDER — FUROSEMIDE 10 MG/ML IJ SOLN
40.0000 mg | Freq: Two times a day (BID) | INTRAMUSCULAR | Status: DC
  Administered 2018-10-05 – 2018-10-06 (×2): 40 mg via INTRAVENOUS
  Filled 2018-10-05 (×2): qty 4

## 2018-10-05 MED ORDER — FUROSEMIDE 10 MG/ML IJ SOLN
40.0000 mg | Freq: Once | INTRAMUSCULAR | Status: AC
  Administered 2018-10-05: 40 mg via INTRAVENOUS
  Filled 2018-10-05: qty 4

## 2018-10-05 MED ORDER — POTASSIUM CHLORIDE CRYS ER 20 MEQ PO TBCR
30.0000 meq | EXTENDED_RELEASE_TABLET | ORAL | Status: AC
  Administered 2018-10-05 (×2): 30 meq via ORAL
  Filled 2018-10-05 (×2): qty 1

## 2018-10-05 MED ORDER — METHYLPREDNISOLONE SODIUM SUCC 125 MG IJ SOLR: 125.0000 mg | Freq: Once | INTRAMUSCULAR | Status: DC

## 2018-10-05 MED ORDER — NICOTINE 21 MG/24HR TD PT24
21.0000 mg | MEDICATED_PATCH | Freq: Every day | TRANSDERMAL | Status: DC
  Filled 2018-10-05: qty 1

## 2018-10-05 MED ORDER — PANTOPRAZOLE SODIUM 40 MG PO TBEC
40.0000 mg | DELAYED_RELEASE_TABLET | Freq: Every day | ORAL | Status: DC
  Administered 2018-10-06: 40 mg via ORAL
  Filled 2018-10-05: qty 1

## 2018-10-05 MED ORDER — ALBUTEROL SULFATE (2.5 MG/3ML) 0.083% IN NEBU
2.5000 mg | INHALATION_SOLUTION | Freq: Once | RESPIRATORY_TRACT | Status: AC
  Administered 2018-10-05: 2.5 mg via RESPIRATORY_TRACT
  Filled 2018-10-05: qty 3

## 2018-10-05 MED ORDER — LISINOPRIL 10 MG PO TABS
20.0000 mg | ORAL_TABLET | Freq: Every day | ORAL | Status: DC
  Administered 2018-10-06: 20 mg via ORAL
  Filled 2018-10-05: qty 2

## 2018-10-05 MED ORDER — SPIRONOLACTONE 25 MG PO TABS
50.0000 mg | ORAL_TABLET | Freq: Every day | ORAL | Status: DC
  Administered 2018-10-06: 50 mg via ORAL
  Filled 2018-10-05: qty 2

## 2018-10-05 MED ORDER — FLUTICASONE PROPIONATE 50 MCG/ACT NA SUSP: 2.0000 | Freq: Every day | NASAL | Status: DC | PRN

## 2018-10-05 MED ORDER — IPRATROPIUM-ALBUTEROL 0.5-2.5 (3) MG/3ML IN SOLN: 3.0000 mL | RESPIRATORY_TRACT | Status: DC | PRN

## 2018-10-05 MED ORDER — OXYCODONE HCL 5 MG PO TABS: 10.0000 mg | ORAL_TABLET | Freq: Four times a day (QID) | ORAL | Status: DC | PRN

## 2018-10-05 MED ORDER — ATORVASTATIN CALCIUM 40 MG PO TABS
80.0000 mg | ORAL_TABLET | Freq: Every day | ORAL | Status: DC
  Administered 2018-10-05: 80 mg via ORAL
  Filled 2018-10-05: qty 2

## 2018-10-05 MED ORDER — INFLUENZA VAC SPLIT QUAD 0.5 ML IM SUSY
0.5000 mL | PREFILLED_SYRINGE | INTRAMUSCULAR | Status: AC
  Administered 2018-10-06: 0.5 mL via INTRAMUSCULAR
  Filled 2018-10-05: qty 0.5

## 2018-10-05 MED ORDER — ENOXAPARIN SODIUM 40 MG/0.4ML ~~LOC~~ SOLN
40.0000 mg | SUBCUTANEOUS | Status: DC
  Administered 2018-10-05: 40 mg via SUBCUTANEOUS
  Filled 2018-10-05: qty 0.4

## 2018-10-05 MED ORDER — INSULIN ASPART 100 UNIT/ML ~~LOC~~ SOLN
0.0000 [IU] | Freq: Three times a day (TID) | SUBCUTANEOUS | Status: DC
  Administered 2018-10-05: 3 [IU] via SUBCUTANEOUS
  Administered 2018-10-06: 1 [IU] via SUBCUTANEOUS

## 2018-10-05 MED ORDER — IPRATROPIUM-ALBUTEROL 0.5-2.5 (3) MG/3ML IN SOLN
3.0000 mL | Freq: Once | RESPIRATORY_TRACT | Status: AC
  Administered 2018-10-05: 3 mL via RESPIRATORY_TRACT
  Filled 2018-10-05: qty 3

## 2018-10-05 MED ORDER — MAGNESIUM SULFATE 2 GM/50ML IV SOLN
2.0000 g | Freq: Once | INTRAVENOUS | Status: AC
  Administered 2018-10-05: 2 g via INTRAVENOUS
  Filled 2018-10-05: qty 50

## 2018-10-05 MED ORDER — HYDRALAZINE HCL 25 MG PO TABS
25.0000 mg | ORAL_TABLET | Freq: Two times a day (BID) | ORAL | Status: DC
  Administered 2018-10-05: 25 mg via ORAL
  Filled 2018-10-05: qty 1

## 2018-10-05 MED ORDER — ENOXAPARIN SODIUM 80 MG/0.8ML ~~LOC~~ SOLN: 75.0000 mg | SUBCUTANEOUS | Status: DC

## 2018-10-05 NOTE — ED Triage Notes (Signed)
Pt reports cough x 2 days.  EMS says pt was very labored and wheezing.  Initial o2 sat 92%.  EMS started bipap and gave duoneb treatment and 125mg  solumedrol IV pta.  Pt says felt better on bipap.  Pt also used albuterol inhaler this morning but ran out.  Denies pain.  ST on monitor with ems.

## 2018-10-05 NOTE — ED Notes (Signed)
CRITICAL VALUE ALERT  Critical Value:  Trop 0.08  Date & Time Notied:  10/05/2018  Provider Notified: Dr. Estell HarpinZammit  Orders Received/Actions taken: no new orders at this time

## 2018-10-05 NOTE — H&P (Addendum)
History and Physical    Chris Powell XKG:[id number] DOB: [birth year] DOA: 10/05/2018  I have briefly reviewed the patient's prior medical records in [facility]  PCP: Raliegh Ip, DO  Patient coming from: home  Chief Complaint: Shortness of breath  HPI: Chris Powell is a 53 y.o. male with medical history significant of hypertension, hyperlipidemia, coronary artery disease with previously placed proximal LAD stent in the past, most recent cardiac catheterization in March 2019 no nonobstructive coronary artery disease, morbid obesity, COPD, prediabetes, presents to the hospital with chief complaint of shortness of breath.  Patient tells me he ran out of his home medications about a week ago, was too late to call his PCP and was asked to come for a visit, and could not do so.  He states that over the last couple of days he has felt more more short of breath, also experienced very mild midsternal chest discomfort on couple of occasions.  He denies any fever or chills, denies any palpitations.  He denies any lightheadedness or dizziness.  He denies lower extremity swelling or feeling like he has fluid overload.  He denies any URI type symptoms or productive cough.  ED Course: In the emergency room he has a low-grade temp of 99.1, he is tachypneic with respiratory rate 22, blood pressure is stable 130s-140s systolic.  He was initially hypoxic requiring 2 L nasal cannula.  Chest x-ray showed mild interstitial edema but no focal consolidation.  Blood work reveals normal renal function, BNP is elevated 294, initial troponin is slightly up at 0.08.  He was given nebulizer treatment, 40 of Lasix and we are asked to admit for diuresis and chest pain rule out.  Review of Systems: As per HPI otherwise 10 point review of systems negative.   Past Medical History:  Diagnosis Date  . Anemia   . Arthritis   . Asthma   . Bone spur of ankle    right   . COPD (chronic obstructive pulmonary  disease) (HCC)   . Coronary artery disease   . Depression   . Diabetes (HCC)    borderline   . GERD (gastroesophageal reflux disease)   . Hyperlipidemia   . Hypertension   . Myocardial infarction (HCC)   . Pneumonia    HX OF PNA  . Right rotator cuff tear 01/06/2018  . Shortness of breath dyspnea   . Sleep apnea    USES CPAP    Past Surgical History:  Procedure Laterality Date  . CARDIAC CATHETERIZATION  12/22/2014  . CORONARY ANGIOPLASTY    . CORONARY STENT PLACEMENT  12/22/2014   LAD  . KNEE SURGERY    . LEFT HEART CATH AND CORONARY ANGIOGRAPHY N/A 12/16/2017   Procedure: LEFT HEART CATH AND CORONARY ANGIOGRAPHY;  Surgeon: Swazil[place], Peter M, MD;  Location: [facility] INVASIVE CV LAB;  Service: Cardiovascular;  Laterality: N/A;  . LEFT HEART CATHETERIZATION WITH CORONARY ANGIOGRAM N/A 12/22/2014   Procedure: LEFT HEART CATHETERIZATION WITH CORONARY ANGIOGRAM;  Surgeon: Corky Crafts, MD;  Location: [facility] CATH LAB;  Service: Cardiovascular;  Laterality: N/A;  . SHOULDER ARTHROSCOPY WITH ROTATOR CUFF REPAIR AND SUBACROMIAL DECOMPRESSION Right 01/06/2018   Procedure: RIGHT SHOULDER ARTHROSCOPY  DEBRIDEMENT,ACROMIOPLASTY, ROTATOR CUFF REPAIR;  Surgeon: Teryl Lucy, MD;  Location: [facility] OR;  Service: Orthopedics;  Laterality: Right;     reports that he has been smoking cigarettes. He has a 30.00 pack-year smoking history. He has never used smokeless tobacco. He reports current alcohol use. He  reports previous drug use. Drug: Cocaine.  No Known Allergies  Family History  Problem Relation Age of Onset  . Heart attack Father   . Diabetes Father   . Cancer Mother        leukemia  . Hypertension Mother   . Hyperlipidemia Mother   . Cancer Sister   . Colon cancer Neg Hx     Prior to Admission medications   Medication Sig Start Date End Date Taking? Authorizing Provider  albuterol (PROVENTIL HFA;VENTOLIN HFA) 108 (90 Base) MCG/ACT inhaler Inhale 2 puffs into the lungs every 6 (six) hours  as needed for wheezing or shortness of breath. 04/07/18  Yes Elenora Gamma, MD  amLODipine (NORVASC) 10 MG tablet Take 1 tablet (10 mg total) by mouth daily. Patient taking differently: Take 10 mg by mouth at bedtime.  04/07/18  Yes Elenora Gamma, MD  aspirin EC 81 MG tablet Take 81 mg by mouth daily.   Yes [provider]  atorvastatin (LIPITOR) 80 MG tablet Take 1 tablet (80 mg total) by mouth daily at 6 PM. Patient taking differently: Take 80 mg by mouth at bedtime.  04/07/18  Yes Elenora Gamma, MD  Cholecalciferol 2000 UNITS CAPS Take 2,000 Units by mouth daily.    Yes [provider]  cloNIDine (CATAPRES) 0.3 MG tablet Take 0.3 mg by mouth 2 (two) times daily.    Yes [provider]  fluticasone (FLONASE) 50 MCG/ACT nasal spray Place 2 sprays into both nostrils daily. Patient taking differently: Place 2 sprays into both nostrils daily as needed for allergies.  03/11/16  Yes Hawks, Christy A, FNP  furosemide (LASIX) 40 MG tablet Take 1 tablet (40 mg total) by mouth daily. 02/28/15  Yes Hawks, Christy A, FNP  hydrALAZINE (APRESOLINE) 50 MG tablet TAKE 1 TABLET (50 MG TOTAL) BY MOUTH 3 (THREE) TIMES DAILY. Patient taking differently: Take 50 mg by mouth 2 (two) times daily.  08/12/16  Yes Nahser, Deloris Ping, MD  lisinopril (PRINIVIL,ZESTRIL) 40 MG tablet Take 1 tablet (40 mg total) by mouth daily. 04/07/18  Yes Elenora Gamma, MD  nitroGLYCERIN (NITROSTAT) 0.4 MG SL tablet Place 1 tablet (0.4 mg total) under the tongue every 5 (five) minutes as needed for chest pain. 01/27/15  Yes Kilroy, Luke K, PA-C  omeprazole (PRILOSEC) 20 MG capsule Take 20 mg by mouth daily before breakfast.    Yes [provider]  Oxycodone HCl 10 MG TABS Take 1-2 tablets (10-20 mg total) by mouth 4 (four) times daily as needed (for pain). Patient taking differently: Take 10 mg by mouth 4 (four) times daily.  01/06/18  Yes Teryl Lucy, MD  potassium chloride (K-DUR) 10 MEQ  tablet Take 10 mEq by mouth daily.   Yes [provider]  sildenafil (REVATIO) 20 MG tablet TAKE 2-5 TABLETS AS NEEDED PRIOR TO SEXUAL ACTIVITY Patient taking differently: Take 40-100 mg by mouth daily as needed (sexual activity).  12/25/17  Yes Nahser, Deloris Ping, MD  spironolactone (ALDACTONE) 50 MG tablet Take 1 tablet (50 mg total) by mouth daily. 12/01/17  Yes Elenora Gamma, MD    Physical Exam: Vitals:   10/05/18 1030 10/05/18 1046 10/05/18 1100 10/05/18 1200  BP: 118/73  118/73   Pulse: 85  76 (!) 115  Resp: 15  (!) 22 (!) 22  Temp:      TempSrc:      SpO2: 95% 96% 98% 96%  Weight:      Height:  Constitutional: NAD, calm, comfortable Eyes: PERRL, lids and conjunctivae normal ENMT: Mucous membranes are moist. Neck: normal, supple, no masses, no thyromegaly Respiratory: Faint bibasilar crackles, no wheezing heard.  Slightly increased respiratory effort, overall difficult exam due to morbid obesity Cardiovascular: Regular rate and rhythm, no murmurs / rubs / gallops.  Trace extremity edema.  Abdomen: no tenderness, no masses palpated. Bowel sounds positive.  Musculoskeletal: no clubbing / cyanosis. Normal muscle tone.  Skin: no rashes, lesions, ulcers. No induration Neurologic: CN 2-12 grossly intact. Strength 5/5 in all 4.  Psychiatric: Normal judgment and insight. Alert and oriented x 3. Normal mood.   Labs on Admission: I have personally reviewed following labs and imaging studies  CBC: Recent Labs  Lab 10/05/18 1023  WBC 9.1  NEUTROABS 6.7  HGB 14.0  HCT 45.3  MCV 90.8  PLT 244   Basic Metabolic Panel: Recent Labs  Lab 10/05/18 1023  NA 139  K 3.2*  CL 104  CO2 27  GLUCOSE 114*  BUN 12  CREATININE 1.13  CALCIUM 8.4*   GFR: Estimated Creatinine Clearance: 109.6 mL/min (by C-G formula based on SCr of 1.13 mg/dL). Liver Function Tests: Recent Labs  Lab 10/05/18 1023  AST 58*  ALT 42  ALKPHOS 66  BILITOT 0.4  PROT 7.3  ALBUMIN  3.3*   No results for input(s): LIPASE, AMYLASE in the last 168 hours. No results for input(s): AMMONIA in the last 168 hours. Coagulation Profile: No results for input(s): INR, PROTIME in the last 168 hours. Cardiac Enzymes: Recent Labs  Lab 10/05/18 1023  TROPONINI 0.08*   BNP (last 3 results) No results for input(s): PROBNP in the last 8760 hours. HbA1C: No results for input(s): HGBA1C in the last 72 hours. CBG: No results for input(s): GLUCAP in the last 168 hours. Lipid Profile: No results for input(s): CHOL, HDL, LDLCALC, TRIG, CHOLHDL, LDLDIRECT in the last 72 hours. Thyroid Function Tests: No results for input(s): TSH, T4TOTAL, FREET4, T3FREE, THYROIDAB in the last 72 hours. Anemia Panel: No results for input(s): VITAMINB12, FOLATE, FERRITIN, TIBC, IRON, RETICCTPCT in the last 72 hours. Urine analysis:    Component Value Date/Time   APPEARANCEUR Clear 09/11/2017 1546   GLUCOSEU Negative 09/11/2017 1546   BILIRUBINUR Negative 09/11/2017 1546   PROTEINUR 2+ (A) 09/11/2017 1546   NITRITE Negative 09/11/2017 1546   LEUKOCYTESUR Negative 09/11/2017 1546     Radiological Exams on Admission: Dg Chest 2 View  Result Date: 10/05/2018 CLINICAL DATA:  Shortness of breath and cough. History of cardiac stent. EXAM: CHEST - 2 VIEW COMPARISON:  11/20/2017 FINDINGS: Again noted are prominent interstitial lung markings which have minimally changed. Heart size is within normal limits and stable. The trachea is midline. No large pleural effusions. Multiple ECG leads overlying the anterior chest. Bone structures are unremarkable. IMPRESSION: Prominent interstitial lung markings have minimally changed. Difficult to exclude mild interstitial edema. No focal areas of airspace disease or lung consolidation. Electronically Signed   By: Richarda OverlieAdam  Henn M.D.   On: 10/05/2018 10:33    EKG: Independently reviewed.  Sinus rhythm  Assessment/Plan Active Problems:   CAD - S/P LAD DES 3/11/22/14    Morbid obesity-BMI 51   COPD (chronic obstructive pulmonary disease) (HCC)   HTN (hypertension)   Hx of non-ST elevation myocardial infarction (NSTEMI)   Hyperlipidemia   Chronic pain syndrome   Diabetes mellitus without complication (HCC)   Shortness of breath   Principal Problem Dyspnea, mild acute on chronic diastolic CHF -Patient initially  hypoxic requiring 2 L but when I evaluated him he is on room air satting mid 90s.  His chest x-ray shows a degree of pulmonary edema, images personally reviewed, and will admit the patient to the hospital to place on IV Lasix -Most recent 2D echo shows an EF of 55 and 60% -Suspect underlying obesity hypoventilation potentially OSA as contributors to his dyspnea  Active Problems Chest pain -Atypical, probably in the setting of pulmonary edema, initial cardiac enzymes 0.08, will continue to cycle and rule out ACS  Hypertension -Patient on significant number of medications at home, has been off of those and blood pressure here is not that high into the 130s-140s systolic.  Hold Norvasc, clonidine, decrease the dose of lisinopril and hydralazine as he will be diuresed.  COPD -No wheezing, not an active issue  Coronary artery disease -Most recent cardiac cath in March 2019 showed nonobstructive coronary artery disease with patent stent -Continue aspirin  Chronic pain syndrome -Continue home oxycodone  Hyperlipidemia -Continue statin  Tobacco abuse -Counseled for cessation, order nicotine patch  Morbid obesity -We will need significant weight loss  Hypokalemia -Replete potassium, monitor closely while on Lasix  Diabetes mellitus, type II, controlled, without complications -Most recent A1c 6.0, placed on sliding scale  DVT prophylaxis: Lovenox Code Status: Full code Family Communication: No family present at bedside Disposition Plan: Admit to telemetry, Consults called: None   Pamella Pertostin Rachit Grim, MD, PhD [facility]  Contact via [email or web address]  [facility] Office Info P: [phone]  F: [phone]   10/05/2018, 12:27 PM

## 2018-10-05 NOTE — Progress Notes (Signed)
Patient states he will have RN call RT when ready to go on CPAP.

## 2018-10-05 NOTE — ED Provider Notes (Signed)
[facility] EMERGENCY DEPARTMENT Provider Note   CSN: [id number] Arrival date & time: 10/05/18  6237     History   Chief Complaint Chief Complaint  Patient presents with  . Shortness of Breath    HPI Chris Powell is a 53 y.o. male.  Patient complains of shortness of breath and chest tightness for the last couple days.  He has not been taking his medicines for COPD or congestive heart failure because he ran out of his medicines and also not taking his medicine for blood pressure  The history is provided by the patient.  Shortness of Breath  Severity:  Moderate Onset quality:  Gradual Timing:  Constant Progression:  Worsening Chronicity:  Recurrent Context: activity   Relieved by:  Nothing Worsened by:  Nothing Ineffective treatments:  None tried Associated symptoms: chest pain   Associated symptoms: no abdominal pain, no cough, no headaches and no rash     Past Medical History:  Diagnosis Date  . Anemia   . Arthritis   . Asthma   . Bone spur of ankle    right   . COPD (chronic obstructive pulmonary disease) (HCC)   . Coronary artery disease   . Depression   . Diabetes (HCC)    borderline   . GERD (gastroesophageal reflux disease)   . Hyperlipidemia   . Hypertension   . Myocardial infarction (HCC)   . Pneumonia    HX OF PNA  . Right rotator cuff tear 01/06/2018  . Shortness of breath dyspnea   . Sleep apnea    USES CPAP    Patient Active Problem List   Diagnosis Date Noted  . Shortness of breath 10/05/2018  . Right rotator cuff tear 01/06/2018  . Angina pectoris (HCC) 12/16/2017  . Diabetes mellitus without complication (HCC) 11/27/2017  . Erectile dysfunction 09/05/2016  . Chronic pain syndrome 01/11/2016  . AC (acromioclavicular) arthritis 09/06/2015  . Sprain of right shoulder 09/06/2015  . Metabolic syndrome 08/29/2015  . Abdominal wall pain 08/15/2015  . Disorder of ligament of right wrist 08/09/2015  . Ganglion of right wrist 08/09/2015    . Primary osteoarthritis of right hand 08/09/2015  . Right wrist pain 07/13/2015  . GERD (gastroesophageal reflux disease) 02/28/2015  . Vitamin D deficiency 02/28/2015  . Hyperlipidemia 02/28/2015  . Chronic knee pain 02/28/2015  . Cocaine abuse (HCC) 01/25/2015  . Hx of non-ST elevation myocardial infarction (NSTEMI) 01/24/2015  . CAD - S/P LAD DES 3/11/22/14 12/23/2014  . Morbid obesity-BMI 51 12/23/2014  . COPD (chronic obstructive pulmonary disease) (HCC) 12/23/2014  . HTN (hypertension) 12/23/2014  . Smoking 12/23/2014  . Non-sustained ventricular tachycardia (HCC) 12/23/2014  . Hypokalemia 12/23/2014  . Acute coronary syndrome (HCC) 12/22/2014    Past Surgical History:  Procedure Laterality Date  . CARDIAC CATHETERIZATION  12/22/2014  . CORONARY ANGIOPLASTY    . CORONARY STENT PLACEMENT  12/22/2014   LAD  . KNEE SURGERY    . LEFT HEART CATH AND CORONARY ANGIOGRAPHY N/A 12/16/2017   Procedure: LEFT HEART CATH AND CORONARY ANGIOGRAPHY;  Surgeon: Swaziland, Peter M, MD;  Location: [facility] INVASIVE CV LAB;  Service: Cardiovascular;  Laterality: N/A;  . LEFT HEART CATHETERIZATION WITH CORONARY ANGIOGRAM N/A 12/22/2014   Procedure: LEFT HEART CATHETERIZATION WITH CORONARY ANGIOGRAM;  Surgeon: Corky Crafts, MD;  Location: [facility] CATH LAB;  Service: Cardiovascular;  Laterality: N/A;  . SHOULDER ARTHROSCOPY WITH ROTATOR CUFF REPAIR AND SUBACROMIAL DECOMPRESSION Right 01/06/2018   Procedure: RIGHT SHOULDER ARTHROSCOPY  DEBRIDEMENT,ACROMIOPLASTY,  ROTATOR CUFF REPAIR;  Surgeon: Teryl LucyLandau, Joshua, MD;  Location[facility] OR;  Service: Orthopedics;  Laterality: Right;        Home Medications    Prior to Admission medications   Medication Sig Start Date End Date Taking? Authorizing Provider  albuterol (PROVENTIL HFA;VENTOLIN HFA) 108 (90 Base) MCG/ACT inhaler Inhale 2 puffs into the lungs every 6 (six) hours as needed for wheezing or shortness of breath. 04/07/18  Yes Elenora GammaBradshaw, Samuel L, MD   amLODipine (NORVASC) 10 MG tablet Take 1 tablet (10 mg total) by mouth daily. Patient taking differently: Take 10 mg by mouth at bedtime.  04/07/18  Yes Elenora GammaBradshaw, Samuel L, MD  aspirin EC 81 MG tablet Take 81 mg by mouth daily.   Yes [provider]  atorvastatin (LIPITOR) 80 MG tablet Take 1 tablet (80 mg total) by mouth daily at 6 PM. Patient taking differently: Take 80 mg by mouth at bedtime.  04/07/18  Yes Elenora GammaBradshaw, Samuel L, MD  Cholecalciferol 2000 UNITS CAPS Take 2,000 Units by mouth daily.    Yes [provider]  cloNIDine (CATAPRES) 0.3 MG tablet Take 0.3 mg by mouth 2 (two) times daily.    Yes [provider]  fluticasone (FLONASE) 50 MCG/ACT nasal spray Place 2 sprays into both nostrils daily. Patient taking differently: Place 2 sprays into both nostrils daily as needed for allergies.  03/11/16  Yes Hawks, Christy A, FNP  furosemide (LASIX) 40 MG tablet Take 1 tablet (40 mg total) by mouth daily. 02/28/15  Yes Hawks, Christy A, FNP  hydrALAZINE (APRESOLINE) 50 MG tablet TAKE 1 TABLET (50 MG TOTAL) BY MOUTH 3 (THREE) TIMES DAILY. Patient taking differently: Take 50 mg by mouth 2 (two) times daily.  08/12/16  Yes Nahser, Deloris PingPhilip J, MD  lisinopril (PRINIVIL,ZESTRIL) 40 MG tablet Take 1 tablet (40 mg total) by mouth daily. 04/07/18  Yes Elenora GammaBradshaw, Samuel L, MD  nitroGLYCERIN (NITROSTAT) 0.4 MG SL tablet Place 1 tablet (0.4 mg total) under the tongue every 5 (five) minutes as needed for chest pain. 01/27/15  Yes Kilroy, Luke K, PA-C  omeprazole (PRILOSEC) 20 MG capsule Take 20 mg by mouth daily before breakfast.    Yes [provider]  Oxycodone HCl 10 MG TABS Take 1-2 tablets (10-20 mg total) by mouth 4 (four) times daily as needed (for pain). Patient taking differently: Take 10 mg by mouth 4 (four) times daily.  01/06/18  Yes Teryl LucyLandau, Joshua, MD  potassium chloride (K-DUR) 10 MEQ tablet Take 10 mEq by mouth daily.   Yes [provider]  sildenafil  (REVATIO) 20 MG tablet TAKE 2-5 TABLETS AS NEEDED PRIOR TO SEXUAL ACTIVITY Patient taking differently: Take 40-100 mg by mouth daily as needed (sexual activity).  12/25/17  Yes Nahser, Deloris PingPhilip J, MD  spironolactone (ALDACTONE) 50 MG tablet Take 1 tablet (50 mg total) by mouth daily. 12/01/17  Yes Elenora GammaBradshaw, Samuel L, MD    Family History Family History  Problem Relation Age of Onset  . Heart attack Father   . Diabetes Father   . Cancer Mother        leukemia  . Hypertension Mother   . Hyperlipidemia Mother   . Cancer Sister   . Colon cancer Neg Hx     Social History Social History   Tobacco Use  . Smoking status: Current Every Day Smoker    Packs/day: 1.00    Years: 30.00    Pack years: 30.00    Types: Cigarettes  . Smokeless tobacco:  Never Used  . Tobacco comment: " I have  tried vapor cigarettes"  Substance Use Topics  . Alcohol use: Yes    Alcohol/week: 0.0 standard drinks    Comment: occ beer   . Drug use: Not Currently    Types: Cocaine    Comment: none in months per patient      Allergies   Patient has no known allergies.   Review of Systems Review of Systems  Constitutional: Negative for appetite change and fatigue.  HENT: Negative for congestion, ear discharge and sinus pressure.   Eyes: Negative for discharge.  Respiratory: Positive for shortness of breath. Negative for cough.   Cardiovascular: Positive for chest pain.  Gastrointestinal: Negative for abdominal pain and diarrhea.  Genitourinary: Negative for frequency and hematuria.  Musculoskeletal: Negative for back pain.  Skin: Negative for rash.  Neurological: Negative for seizures and headaches.  Psychiatric/Behavioral: Negative for hallucinations.     Physical Exam Updated Vital Signs BP 118/73   Pulse (!) 115   Temp 99.1 F (37.3 C) (Oral)   Resp (!) 22   Ht 5\' 7"  (1.702 m)   Wt (!) 154.2 kg   SpO2 96%   BMI 53.25 kg/m   Physical Exam Vitals signs reviewed.  Constitutional:       Appearance: He is well-developed.  HENT:     Head: Normocephalic.     Nose: Nose normal.  Eyes:     General: No scleral icterus.    Conjunctiva/sclera: Conjunctivae normal.  Neck:     Musculoskeletal: Neck supple.     Thyroid: No thyromegaly.  Cardiovascular:     Rate and Rhythm: Normal rate and regular rhythm.     Heart sounds: No murmur. No friction rub. No gallop.   Pulmonary:     Breath sounds: No stridor. No wheezing or rales.  Chest:     Chest wall: No tenderness.  Abdominal:     General: There is no distension.     Tenderness: There is no abdominal tenderness. There is no rebound.  Musculoskeletal: Normal range of motion.  Lymphadenopathy:     Cervical: No cervical adenopathy.  Skin:    Capillary Refill: Capillary refill takes less than 2 seconds.     Findings: No erythema or rash.  Neurological:     Mental Status: He is oriented to person, place, and time.     Motor: No abnormal muscle tone.     Coordination: Coordination normal.  Psychiatric:        Behavior: Behavior normal.      ED Treatments / Results  Labs (all labs ordered are listed, but only abnormal results are displayed) Labs Reviewed  BRAIN NATRIURETIC PEPTIDE - Abnormal; Notable for the following components:      Result Value   B Natriuretic Peptide 294.0 (*)    All other components within normal limits  TROPONIN I - Abnormal; Notable for the following components:   Troponin I 0.08 (*)    All other components within normal limits  COMPREHENSIVE METABOLIC PANEL - Abnormal; Notable for the following components:   Potassium 3.2 (*)    Glucose, Bld 114 (*)    Calcium 8.4 (*)    Albumin 3.3 (*)    AST 58 (*)    All other components within normal limits  CBC WITH DIFFERENTIAL/PLATELET    EKG EKG Interpretation  Date/Time:  Monday October 05 2018 09:47:57 EST Ventricular Rate:  81 PR Interval:    QRS Duration: 90 QT Interval:  427 QTC Calculation: 496 R Axis:   76 Text Interpretation:   Sinus rhythm Atrial premature complex Short PR interval Abnormal T, consider ischemia, lateral leads Confirmed by Bethann Berkshire [phone]) on 10/05/2018 11:11:55 AM   Radiology Dg Chest 2 View  Result Date: 10/05/2018 CLINICAL DATA:  Shortness of breath and cough. History of cardiac stent. EXAM: CHEST - 2 VIEW COMPARISON:  11/20/2017 FINDINGS: Again noted are prominent interstitial lung markings which have minimally changed. Heart size is within normal limits and stable. The trachea is midline. No large pleural effusions. Multiple ECG leads overlying the anterior chest. Bone structures are unremarkable. IMPRESSION: Prominent interstitial lung markings have minimally changed. Difficult to exclude mild interstitial edema. No focal areas of airspace disease or lung consolidation. Electronically Signed   By: Richarda Overlie M.D.   On: 10/05/2018 10:33    Procedures Procedures (including critical care time)  Medications Ordered in ED Medications  furosemide (LASIX) injection 40 mg (40 mg Intravenous Given 10/05/18 1114)  ipratropium-albuterol (DUONEB) 0.5-2.5 (3) MG/3ML nebulizer solution 3 mL (3 mLs Nebulization Given 10/05/18 1046)  albuterol (PROVENTIL) (2.5 MG/3ML) 0.083% nebulizer solution 2.5 mg (2.5 mg Nebulization Given 10/05/18 1046)  magnesium sulfate IVPB 2 g 50 mL (0 g Intravenous Stopped 10/05/18 1215)     Initial Impression / Assessment and Plan / ED Course  I have reviewed the triage vital signs and the nursing notes.  Pertinent labs & imaging results that were available during my care of the patient were reviewed by me and considered in my medical decision making (see chart for details).     CRITICAL CARE Performed by: Bethann Berkshire Total critical care time: Critical care time was exclusive of separately billable procedures and treating other patients. Critical care was necessary to treat or prevent imminent or life-threatening deterioration. Critical care was time spent  personally by me on the following activities: development of treatment plan with patient and/or surrogate as well as nursing, discussions with consultants, evaluation of patient's response to treatment, examination of patient, obtaining history from patient or surrogate, ordering and performing treatments and interventions, ordering and review of laboratory studies, ordering and review of radiographic studies, pulse oximetry and re-evaluation of patient's condition.  Patient with congestive heart failure and exacerbation of COPD.  Patient has elevated troponin and he will be admitted to medicine and placed back on his medicines to treat his coronary artery disease COPD and heart failure Final Clinical Impressions(s) / ED Diagnoses   Final diagnoses:  Dyspnea, unspecified type    ED Discharge Orders    None       Bethann Berkshire, MD 10/05/18 1236

## 2018-10-06 DIAGNOSIS — I10 Essential (primary) hypertension: Secondary | ICD-10-CM | POA: Diagnosis not present

## 2018-10-06 DIAGNOSIS — J42 Unspecified chronic bronchitis: Secondary | ICD-10-CM | POA: Diagnosis not present

## 2018-10-06 DIAGNOSIS — I2510 Atherosclerotic heart disease of native coronary artery without angina pectoris: Secondary | ICD-10-CM | POA: Diagnosis not present

## 2018-10-06 DIAGNOSIS — R0600 Dyspnea, unspecified: Secondary | ICD-10-CM | POA: Diagnosis not present

## 2018-10-06 LAB — COMPREHENSIVE METABOLIC PANEL
ALT: 35 U/L (ref 0–44)
AST: 25 U/L (ref 15–41)
Albumin: 3.6 g/dL (ref 3.5–5.0)
Alkaline Phosphatase: 60 U/L (ref 38–126)
Anion gap: 9 (ref 5–15)
BUN: 18 mg/dL (ref 6–20)
CO2: 26 mmol/L (ref 22–32)
Calcium: 8.9 mg/dL (ref 8.9–10.3)
Chloride: 104 mmol/L (ref 98–111)
Creatinine, Ser: 1.04 mg/dL (ref 0.61–1.24)
GFR calc Af Amer: >60 mL/min (ref >60)
Glucose, Bld: 130 mg/dL — ABNORMAL HIGH (ref 70–99)
Potassium: 3.4 mmol/L — ABNORMAL LOW (ref 3.5–5.1)
Sodium: 139 mmol/L (ref 135–145)
TOTAL PROTEIN: 7.9 g/dL (ref 6.5–8.1)
Total Bilirubin: 0.4 mg/dL (ref 0.3–1.2)

## 2018-10-06 LAB — GLUCOSE, CAPILLARY
Glucose-Capillary: 129 mg/dL — ABNORMAL HIGH (ref 70–99)
Glucose-Capillary: 149 mg/dL — ABNORMAL HIGH (ref 70–99)

## 2018-10-06 LAB — CBC
HCT: 47.9 % (ref 39.0–52.0)
Hemoglobin: 15.1 g/dL (ref 13.0–17.0)
MCH: 28.4 pg (ref 26.0–34.0)
MCHC: 31.5 g/dL (ref 30.0–36.0)
MCV: 90.2 fL (ref 80.0–100.0)
Platelets: 292 10*3/uL (ref 150–400)
RBC: 5.31 MIL/uL (ref 4.22–5.81)
RDW: 14.7 % (ref 11.5–15.5)
WBC: 12.7 10*3/uL — AB (ref 4.0–10.5)
nRBC: 0 % (ref 0.0–0.2)

## 2018-10-06 LAB — HIV ANTIBODY (ROUTINE TESTING W REFLEX): HIV Screen 4th Generation wRfx: NONREACTIVE

## 2018-10-06 MED ORDER — OMEPRAZOLE 20 MG PO CPDR: 20.0000 mg | DELAYED_RELEASE_CAPSULE | Freq: Every day | ORAL | 0 refills | Status: DC

## 2018-10-06 MED ORDER — ALBUTEROL SULFATE HFA 108 (90 BASE) MCG/ACT IN AERS: 2.0000 | INHALATION_SPRAY | Freq: Four times a day (QID) | RESPIRATORY_TRACT | 2 refills | Status: DC | PRN

## 2018-10-06 MED ORDER — NITROGLYCERIN 0.4 MG SL SUBL: 0.4000 mg | SUBLINGUAL_TABLET | SUBLINGUAL | 2 refills | Status: DC | PRN

## 2018-10-06 MED ORDER — LISINOPRIL 10 MG PO TABS: 40.0000 mg | ORAL_TABLET | Freq: Every day | ORAL | Status: DC

## 2018-10-06 MED ORDER — AMLODIPINE BESYLATE 5 MG PO TABS
10.0000 mg | ORAL_TABLET | Freq: Every day | ORAL | Status: DC
  Administered 2018-10-06: 10 mg via ORAL
  Filled 2018-10-06: qty 2

## 2018-10-06 MED ORDER — CLONIDINE HCL 0.2 MG PO TABS
0.3000 mg | ORAL_TABLET | Freq: Two times a day (BID) | ORAL | Status: DC
  Administered 2018-10-06: 0.3 mg via ORAL
  Filled 2018-10-06: qty 1

## 2018-10-06 MED ORDER — AMLODIPINE BESYLATE 10 MG PO TABS: 10.0000 mg | ORAL_TABLET | Freq: Every day | ORAL | 1 refills | Status: DC

## 2018-10-06 MED ORDER — SPIRONOLACTONE 50 MG PO TABS: 50.0000 mg | ORAL_TABLET | Freq: Every day | ORAL | 1 refills | Status: DC

## 2018-10-06 MED ORDER — ATORVASTATIN CALCIUM 80 MG PO TABS: 80.0000 mg | ORAL_TABLET | Freq: Every day | ORAL | 1 refills | Status: DC

## 2018-10-06 MED ORDER — POTASSIUM CHLORIDE ER 10 MEQ PO TBCR: 10.0000 meq | EXTENDED_RELEASE_TABLET | Freq: Every day | ORAL | 1 refills | Status: DC

## 2018-10-06 MED ORDER — CLONIDINE HCL 0.3 MG PO TABS: 0.3000 mg | ORAL_TABLET | Freq: Two times a day (BID) | ORAL | 1 refills | Status: DC

## 2018-10-06 MED ORDER — HYDRALAZINE HCL 25 MG PO TABS
50.0000 mg | ORAL_TABLET | Freq: Two times a day (BID) | ORAL | Status: DC
  Administered 2018-10-06: 50 mg via ORAL
  Filled 2018-10-06: qty 2

## 2018-10-06 MED ORDER — FLUTICASONE PROPIONATE 50 MCG/ACT NA SUSP: 2.0000 | Freq: Every day | NASAL | 0 refills | Status: DC | PRN

## 2018-10-06 MED ORDER — FUROSEMIDE 40 MG PO TABS: 40.0000 mg | ORAL_TABLET | Freq: Every day | ORAL | 1 refills | Status: DC

## 2018-10-06 MED ORDER — LISINOPRIL 40 MG PO TABS: 40.0000 mg | ORAL_TABLET | Freq: Every day | ORAL | 1 refills | Status: DC

## 2018-10-06 MED ORDER — HYDRALAZINE HCL 50 MG PO TABS: 50.0000 mg | ORAL_TABLET | Freq: Three times a day (TID) | ORAL | 1 refills | Status: DC

## 2018-10-06 MED ORDER — HYDRALAZINE HCL 20 MG/ML IJ SOLN
5.0000 mg | Freq: Once | INTRAMUSCULAR | Status: AC
  Administered 2018-10-06: 5 mg via INTRAVENOUS
  Filled 2018-10-06: qty 1

## 2018-10-06 NOTE — Discharge Summary (Signed)
Physician Discharge Summary  [name] OMV:[id number] DOB: 1966-09-09 DOA: 10/05/2018  PCP: Raliegh Ip, DO  Admit date: 10/05/2018 Discharge date: 10/06/2018  Admitted From: home Disposition:  home  Recommendations for Outpatient Follow-up:  1. Follow up with PCP in 1-2 weeks  Home Health: none Equipment/Devices: nightly CPAP  Discharge Condition: stable CODE STATUS: Full code Diet recommendation: low sodium  HPI: Per admitting MD, Chris Powell is a 53 y.o. male with medical history significant of hypertension, hyperlipidemia, coronary artery disease with previously placed proximal LAD stent in the past, most recent cardiac catheterization in March 2019 no nonobstructive coronary artery disease, morbid obesity, COPD, prediabetes, presents to the hospital with chief complaint of shortness of breath.  Patient tells me he ran out of his home medications about a week ago, was too late to call his PCP and was asked to come for a visit, and could not do so.  He states that over the last couple of days he has felt more more short of breath, also experienced very mild midsternal chest discomfort on couple of occasions.  He denies any fever or chills, denies any palpitations.  He denies any lightheadedness or dizziness.  He denies lower extremity swelling or feeling like he has fluid overload.  He denies any URI type symptoms or productive cough. ED Course: In the emergency room he has a low-grade temp of 99.1, he is tachypneic with respiratory rate 22, blood pressure is stable 130s-140s systolic.  He was initially hypoxic requiring 2 L nasal cannula.  Chest x-ray showed mild interstitial edema but no focal consolidation.  Blood work reveals normal renal function, BNP is elevated 294, initial troponin is slightly up at 0.08.  He was given nebulizer treatment, 40 of Lasix and we are asked to admit for diuresis and chest pain rule out.  Hospital Course: Acute on chronic diastolic CHF  - patient was admitted to the hospital with dyspnea and mild acute on chronic diastolic CHF in the setting of medication nonadherence as he ran out of his home medications.  He was placed back on his home meds, he was given 2 doses of IV Lasix, with significant improvement in his respiratory status, he is back to baseline in the morning, able to ambulate in the hallway without any shortness of breath or chest pain.  Most recent 2D echo showed an EF of 55-60%.  Suspect contributors are as well obesity hypoventilation as well as obstructive sleep apnea.  He was discharged home in stable condition, back to baseline, and was given all his prescriptions at the time of discharge. Chest pain -atypical, likely in the setting of fluid overload, possibly elevated blood pressure at home as he had no medications.  His troponins were flat, not in a pattern consistent with ACS.  His chest pain resolved Hypertension -worse as he was off of his home medications, he was placed back on them and improved.  Strongly advised close outpatient follow-up with PCP for ambulatory blood pressure monitoring as well as low-sodium diet, weight loss and tobacco cessation COPD -no wheezing, not an active issue Coronary artery disease -most recent cardiac cath in March 2019 showed nonobstructive coronary artery disease, continue aspirin Chronic pain syndrome -continue pain management as per outpatient MD Hyperlipidemia -continue statin Tobacco abuse -counseled for cessation, refused nicotine patch and he stated that he will quit on his own Type 2 diabetes mellitus, controlled, no complications -continue home medications  Discharge Diagnoses:  Active Problems:   CAD -  S/P LAD DES 3/11/22/14   Morbid obesity-BMI 51   COPD (chronic obstructive pulmonary disease) (HCC)   HTN (hypertension)   Hx of non-ST elevation myocardial infarction (NSTEMI)   Hyperlipidemia   Chronic pain syndrome   Diabetes mellitus without complication (HCC)    Shortness of breath     Discharge Instructions   Allergies as of 10/06/2018   No Known Allergies     Medication List    TAKE these medications   albuterol 108 (90 Base) MCG/ACT inhaler Commonly known as:  PROVENTIL HFA;VENTOLIN HFA Inhale 2 puffs into the lungs every 6 (six) hours as needed for wheezing or shortness of breath.   amLODipine 10 MG tablet Commonly known as:  NORVASC Take 1 tablet (10 mg total) by mouth daily. What changed:  when to take this   aspirin EC 81 MG tablet Take 81 mg by mouth daily.   atorvastatin 80 MG tablet Commonly known as:  LIPITOR Take 1 tablet (80 mg total) by mouth daily at 6 PM. What changed:  when to take this   Cholecalciferol 50 MCG (2000 UT) Caps Take 2,000 Units by mouth daily.   cloNIDine 0.3 MG tablet Commonly known as:  CATAPRES Take 1 tablet (0.3 mg total) by mouth 2 (two) times daily.   fluticasone 50 MCG/ACT nasal spray Commonly known as:  FLONASE Place 2 sprays into both nostrils daily as needed for allergies.   furosemide 40 MG tablet Commonly known as:  LASIX Take 1 tablet (40 mg total) by mouth daily.   hydrALAZINE 50 MG tablet Commonly known as:  APRESOLINE Take 1 tablet (50 mg total) by mouth 3 (three) times daily. What changed:  when to take this   lisinopril 40 MG tablet Commonly known as:  PRINIVIL,ZESTRIL Take 1 tablet (40 mg total) by mouth daily.   nitroGLYCERIN 0.4 MG SL tablet Commonly known as:  NITROSTAT Place 1 tablet (0.4 mg total) under the tongue every 5 (five) minutes as needed for chest pain.   omeprazole 20 MG capsule Commonly known as:  PRILOSEC Take 1 capsule (20 mg total) by mouth daily before breakfast.   Oxycodone HCl 10 MG Tabs Take 1-2 tablets (10-20 mg total) by mouth 4 (four) times daily as needed (for pain). What changed:    how much to take  when to take this   potassium chloride 10 MEQ tablet Commonly known as:  K-DUR Take 1 tablet (10 mEq total) by mouth daily.     sildenafil 20 MG tablet Commonly known as:  REVATIO TAKE 2-5 TABLETS AS NEEDED PRIOR TO SEXUAL ACTIVITY What changed:    how much to take  how to take this  when to take this  reasons to take this  additional instructions   spironolactone 50 MG tablet Commonly known as:  ALDACTONE Take 1 tablet (50 mg total) by mouth daily.      Follow-up Information    Delynn Flavin M, DO. Schedule an appointment as soon as possible for a visit in 1 week(s).   Specialty:  Family Medicine Contact information: [address] [place] [address] [phone]        Nahser, Deloris Ping, MD .   Specialty:  Cardiology Contact information: [address] [place] [address] [phone]           Consultations:  None   Procedures/Studies:  Dg Chest 2 View  Result Date: 10/05/2018 CLINICAL DATA:  Shortness of breath and cough. History of cardiac  stent. EXAM: CHEST - 2 VIEW COMPARISON:  11/20/2017 FINDINGS: Again noted are prominent interstitial lung markings which have minimally changed. Heart size is within normal limits and stable. The trachea is midline. No large pleural effusions. Multiple ECG leads overlying the anterior chest. Bone structures are unremarkable. IMPRESSION: Prominent interstitial lung markings have minimally changed. Difficult to exclude mild interstitial edema. No focal areas of airspace disease or lung consolidation. Electronically Signed   By: Richarda OverlieAdam  Henn M.D.   On: 10/05/2018 10:33      Subjective: - no chest pain, shortness of breath, no abdominal pain, nausea or vomiting. \  Discharge Exam: Vitals:   10/06/18 1050 10/06/18 1148  BP: (!) 193/83 (!) 152/76  Pulse:  88  Resp:    Temp:    SpO2:      General: Pt is alert, awake, not in acute distress Cardiovascular: RRR, S1/S2 +, no rubs, no gallops Respiratory: CTA bilaterally, no wheezing, no rhonchi Abdominal: Soft, NT, ND, bowel sounds + Extremities: no edema, no  cyanosis    The results of significant diagnostics from this hospitalization (including imaging, microbiology, ancillary and laboratory) are listed below for reference.     Microbiology: No results found for this or any previous visit (from the past 240 hour(s)).   Labs: BNP (last 3 results) Recent Labs    10/05/18 1023  BNP 294.0*   Basic Metabolic Panel: Recent Labs  Lab 10/05/18 1023 10/06/18 0529  NA 139 139  K 3.2* 3.4*  CL 104 104  CO2 27 26  GLUCOSE 114* 130*  BUN 12 18  CREATININE 1.13 1.04  CALCIUM 8.4* 8.9   Liver Function Tests: Recent Labs  Lab 10/05/18 1023 10/06/18 0529  AST 58* 25  ALT 42 35  ALKPHOS 66 60  BILITOT 0.4 0.4  PROT 7.3 7.9  ALBUMIN 3.3* 3.6   No results for input(s): LIPASE, AMYLASE in the last 168 hours. No results for input(s): AMMONIA in the last 168 hours. CBC: Recent Labs  Lab 10/05/18 1023 10/06/18 0529  WBC 9.1 12.7*  NEUTROABS 6.7  --   HGB 14.0 15.1  HCT 45.3 47.9  MCV 90.8 90.2  PLT 244 292   Cardiac Enzymes: Recent Labs  Lab 10/05/18 1023 10/05/18 1413  TROPONINI 0.08* 0.07*   BNP: Invalid input(s): POCBNP CBG: Recent Labs  Lab 10/05/18 1709 10/05/18 2211 10/06/18 0754 10/06/18 1119  GLUCAP 244* 188* 129* 149*   D-Dimer No results for input(s): DDIMER in the last 72 hours. Hgb A1c No results for input(s): HGBA1C in the last 72 hours. Lipid Profile No results for input(s): CHOL, HDL, LDLCALC, TRIG, CHOLHDL, LDLDIRECT in the last 72 hours. Thyroid function studies No results for input(s): TSH, T4TOTAL, T3FREE, THYROIDAB in the last 72 hours.  Invalid input(s): FREET3 Anemia work up No results for input(s): VITAMINB12, FOLATE, FERRITIN, TIBC, IRON, RETICCTPCT in the last 72 hours. Urinalysis    Component Value Date/Time   APPEARANCEUR Clear 09/11/2017 1546   GLUCOSEU Negative 09/11/2017 1546   BILIRUBINUR Negative 09/11/2017 1546   PROTEINUR 2+ (A) 09/11/2017 1546   NITRITE Negative  09/11/2017 1546   LEUKOCYTESUR Negative 09/11/2017 1546   Sepsis Labs Invalid input(s): PROCALCITONIN,  WBC,  LACTICIDVEN   Time coordinating discharge: 35 minutes  SIGNED:  Pamella Pertostin Devonna Oboyle, MD  [facility] 10/06/2018, 12:31 PM

## 2018-10-06 NOTE — Discharge Instructions (Signed)
CHF  Discharge instructions  DIET  Low sodium Heart Healthy Diet with fluid restriction 1500cc/day  ACTIVITY  Avoid strenuous activity  WEIGH DAILY AT SAME TIME . CALL YOUR DOCTOR IF WEIGHT INCREASES BY MORE THAN 3  POUNDS IN 2 DAYS  CALL YOUR DOCTOR OR COME TO EMERGENCY ROOM IF WORSENING SHORTNESS OF BREATH OR  CHEST PAIN OR SWELLING  F/U with PMD in 1 week to recheck labs and adjust fluid pills as needed  If you smoke cigarettes or use any tobacco products you are advised to stop. Please ask nurse for any written materials  Or additional information you want regarding smoking cessation  Follow with Raliegh Ip, DO in 5-7 days  Please get a complete blood count and chemistry panel checked by your Primary MD at your next visit, and again as instructed by your Primary MD. Please get your medications reviewed and adjusted by your Primary MD.  Please request your Primary MD to go over all Hospital Tests and Procedure/Radiological results at the follow up, please get all Hospital records sent to your Prim MD by signing hospital release before you go home.  If you had Pneumonia of Lung problems at the Hospital: Please get a 2 view Chest X ray done in 6-8 weeks after hospital discharge or sooner if instructed by your Primary MD.  If you have Congestive Heart Failure: Please call your Cardiologist or Primary MD anytime you have any of the following symptoms:  1) 3 pound weight gain in 24 hours or 5 pounds in 1 week  2) shortness of breath, with or without a dry hacking cough  3) swelling in the hands, feet or stomach  4) if you have to sleep on extra pillows at night in order to breathe  Follow cardiac low salt diet and 1.5 lit/day fluid restriction.  If you have diabetes Accuchecks 4 times/day, Once in AM empty stomach and then before each meal. Log in all results and show them to your primary doctor at your next visit. If any glucose reading is under 80 or above 300 call your  primary MD immediately.  If you have Seizure/Convulsions/Epilepsy: Please do not drive, operate heavy machinery, participate in activities at heights or participate in high speed sports until you have seen by Primary MD or a Neurologist and advised to do so again.  If you had Gastrointestinal Bleeding: Please ask your Primary MD to check a complete blood count within one week of discharge or at your next visit. Your endoscopic/colonoscopic biopsies that are pending at the time of discharge, will also need to followed by your Primary MD.  Get Medicines reviewed and adjusted. Please take all your medications with you for your next visit with your Primary MD  Please request your Primary MD to go over all hospital tests and procedure/radiological results at the follow up, please ask your Primary MD to get all Hospital records sent to his/her office.  If you experience worsening of your admission symptoms, develop shortness of breath, life threatening emergency, suicidal or homicidal thoughts you must seek medical attention immediately by calling 911 or calling your MD immediately  if symptoms less severe.  You must read complete instructions/literature along with all the possible adverse reactions/side effects for all the Medicines you take and that have been prescribed to you. Take any new Medicines after you have completely understood and accpet all the possible adverse reactions/side effects.   Do not drive or operate heavy machinery when taking Pain medications.  Do not take more than prescribed Pain, Sleep and Anxiety Medications  Special Instructions: If you have smoked or chewed Tobacco  in the last 2 yrs please stop smoking, stop any regular Alcohol  and or any Recreational drug use.  Wear Seat belts while driving.  Please note You were cared for by a hospitalist during your hospital stay. If you have any questions about your discharge medications or the care you received while you  were in the hospital after you are discharged, you can call the unit and asked to speak with the hospitalist on call if the hospitalist that took care of you is not available. Once you are discharged, your primary care physician will handle any further medical issues. Please note that NO REFILLS for any discharge medications will be authorized once you are discharged, as it is imperative that you return to your primary care physician (or establish a relationship with a primary care physician if you do not have one) for your aftercare needs so that they can reassess your need for medications and monitor your lab values.  You can reach the hospitalist office at phone [phone] or fax [phone]   If you do not have a primary care physician, you can call [phone] for a physician referral.  Activity: As tolerated with Full fall precautions use walker/cane & assistance as needed  Diet: low sodium  Disposition Home

## 2018-11-05 ENCOUNTER — Ambulatory Visit: Payer: Medicaid Other | Admitting: Family

## 2018-11-11 ENCOUNTER — Ambulatory Visit: Payer: Medicaid Other | Admitting: Family Medicine

## 2018-11-13 ENCOUNTER — Ambulatory Visit: Payer: Medicaid Other | Admitting: Family Medicine

## 2018-11-13 ENCOUNTER — Encounter: Payer: Self-pay | Admitting: Family Medicine

## 2018-11-13 VITALS — BP 176/96 | HR 90 | Temp 97.2°F | Ht 67.0 in | Wt 342.0 lb

## 2018-11-13 DIAGNOSIS — M109 Gout, unspecified: Secondary | ICD-10-CM | POA: Diagnosis not present

## 2018-11-13 DIAGNOSIS — J441 Chronic obstructive pulmonary disease with (acute) exacerbation: Secondary | ICD-10-CM | POA: Diagnosis not present

## 2018-11-13 MED ORDER — COLCHICINE 0.6 MG PO TABS: 0.6000 mg | ORAL_TABLET | Freq: Every day | ORAL | 3 refills | Status: DC

## 2018-11-13 MED ORDER — AZITHROMYCIN 250 MG PO TABS: ORAL_TABLET | ORAL | 0 refills | Status: DC

## 2018-11-13 MED ORDER — ALBUTEROL SULFATE HFA 108 (90 BASE) MCG/ACT IN AERS: 2.0000 | INHALATION_SPRAY | Freq: Four times a day (QID) | RESPIRATORY_TRACT | 2 refills | Status: DC | PRN

## 2018-11-13 NOTE — Addendum Note (Signed)
Addended by: Arville Care on: 11/13/2018 12:09 PM   Modules accepted: Orders

## 2018-11-13 NOTE — Progress Notes (Addendum)
BP (!) 193/104   Pulse 90   Temp (!) 97.2 F (36.2 C) (Oral)   Ht 5\' 7"  (1.702 m)   Wt (!) 342 lb (155.1 kg)   BMI 53.56 kg/m    Subjective:    Patient ID: Chris Powell, male    DOB: [birth year], 53 y.o.   MRN: [id number]  HPI: Chris Powell is a 53 y.o. male presenting on 11/13/2018 for Foot Pain (Patient states he has been having swelling and pain of right foot x 3 weeks)   HPI Right toe pain Patient is coming in with right great toe pain that has been going on for the past 3 weeks.  He says initially he started with an ingrown toenail and then he wedged that.when they were over the past couple days ago he developed pain in the right foot and joint of his big toe of his foot.  He says the pain is quite severe in that right big toe and it does not go anywhere else.  He says the pain is worse in that knuckle of the right great toe, he says is also been red and swollen there but that is coming down a little bit and he has been using a little bit ibuprofen but not too much because of his kidney doctors request.  Patient also complains of some cough and congestion and wheezing is been going on over the past 3 or 4 weeks.  He says he had some inhalers but has not used anything over-the-counter except Mucinex and he feels like the Mucinex does help some but is not getting rid of it.  Patient denies any fevers or chills but mainly complains of the congestion and pressure in his sinuses.  Relevant past medical, surgical, family and social history reviewed and updated as indicated. Interim medical history since our last visit reviewed. Allergies and medications reviewed and updated.  Review of Systems  Constitutional: Negative for chills and fever.  HENT: Positive for congestion, postnasal drip, rhinorrhea and sinus pressure. Negative for ear discharge, ear pain, sneezing, sore throat and voice change.   Eyes: Negative for pain, discharge, redness and visual disturbance.  Respiratory:  Positive for cough. Negative for shortness of breath and wheezing.   Cardiovascular: Negative for chest pain and leg swelling.  Gastrointestinal: Negative for abdominal pain.  Musculoskeletal: Positive for arthralgias and joint swelling. Negative for back pain and gait problem.  Skin: Negative for rash.  All other systems reviewed and are negative.   Per HPI unless specifically indicated above      Objective:    BP (!) 193/104   Pulse 90   Temp (!) 97.2 F (36.2 C) (Oral)   Ht 5\' 7"  (1.702 m)   Wt (!) 342 lb (155.1 kg)   BMI 53.56 kg/m   Wt Readings from Last 3 Encounters:  11/13/18 (!) 342 lb (155.1 kg)  10/06/18 (!) 337 lb 4.9 oz (153 kg)  07/28/18 (!) 337 lb (152.9 kg)    Physical Exam Vitals signs and nursing note reviewed.  Constitutional:      General: He is not in acute distress.    Appearance: He is well-developed. He is not diaphoretic.  HENT:     Right Ear: Tympanic membrane, ear canal and external ear normal.     Left Ear: Tympanic membrane, ear canal and external ear normal.     Nose: Mucosal edema and rhinorrhea present.     Right Sinus: Maxillary sinus tenderness present. No frontal  sinus tenderness.     Left Sinus: Maxillary sinus tenderness present. No frontal sinus tenderness.     Mouth/Throat:     Pharynx: Uvula midline. Posterior oropharyngeal erythema present. No oropharyngeal exudate.     Tonsils: No tonsillar abscesses.  Eyes:     General: No scleral icterus.    Conjunctiva/sclera: Conjunctivae normal.  Neck:     Musculoskeletal: Neck supple.     Thyroid: No thyromegaly.  Cardiovascular:     Rate and Rhythm: Normal rate and regular rhythm.     Heart sounds: Normal heart sounds. No murmur.  Pulmonary:     Effort: Pulmonary effort is normal. No respiratory distress.     Breath sounds: Normal breath sounds. No wheezing or rales.  Musculoskeletal:     Right foot: Normal range of motion. Tenderness and swelling present. No deformity.        Feet:  Lymphadenopathy:     Cervical: No cervical adenopathy.  Skin:    General: Skin is warm and dry.     Findings: No rash.  Neurological:     Mental Status: He is alert and oriented to person, place, and time.     Coordination: Coordination normal.  Psychiatric:        Behavior: Behavior normal.         Assessment & Plan:   Problem List Items Addressed This Visit    None    Visit Diagnoses    Acute gout involving toe of right foot, unspecified cause    -  Primary   Relevant Medications   colchicine 0.6 MG tablet   Other Relevant Orders   Uric acid   COPD exacerbation (HCC)       Relevant Medications   azithromycin (ZITHROMAX) 250 MG tablet   albuterol (PROVENTIL HFA;VENTOLIN HFA) 108 (90 Base) MCG/ACT inhaler      Will treat mild COPD exacerbation with azithromycin and his albuterol  Will give colchicine to help with a gout attack, will do uric acid as well, if it continues to be a problem issues area discuss other options. Follow up plan: Return in about 2 weeks (around 11/27/2018), or if symptoms worsen or fail to improve, for Blood pressure recheck with PCP.  Counseling provided for all of the vaccine components Orders Placed This Encounter  Procedures  . Uric acid    Patient has elevated blood pressures today, will have him follow-up in 2 weeks with PCP for blood pressure recheck, had not taken his medications this morning  Arville Care, MD [facility] 11/13/2018, 9:37 AM

## 2018-11-14 LAB — URIC ACID: Uric Acid: 6.4 mg/dL (ref 3.7–8.6)

## 2018-12-11 ENCOUNTER — Other Ambulatory Visit: Payer: Self-pay | Admitting: *Deleted

## 2018-12-11 NOTE — Telephone Encounter (Signed)
Fax RF from [facility] Request for Gabapentin 600 mg 1 TID #90 Not on current med list since discharge in January Please advise

## 2018-12-11 NOTE — Telephone Encounter (Signed)
Chris Powell prescribed this last year.  He has not been seen for chronic care in >9 months.  Please have patient schedule a follow up if he is needing this medication.

## 2018-12-11 NOTE — Telephone Encounter (Signed)
Patient aware and had upcoming appt

## 2018-12-17 ENCOUNTER — Other Ambulatory Visit: Payer: Self-pay

## 2018-12-18 ENCOUNTER — Ambulatory Visit: Payer: Medicaid Other | Admitting: Family Medicine

## 2019-01-06 ENCOUNTER — Inpatient Hospital Stay (HOSPITAL_COMMUNITY)
Admission: EM | Admit: 2019-01-06 | Discharge: 2019-01-13 | DRG: 246 | Disposition: A | Discharge status: Home / Self Care | Payer: Medicaid Other | Attending: Internal Medicine | Admitting: Internal Medicine

## 2019-01-06 ENCOUNTER — Emergency Department (HOSPITAL_COMMUNITY): Payer: Medicaid Other

## 2019-01-06 ENCOUNTER — Inpatient Hospital Stay (HOSPITAL_COMMUNITY): Payer: Medicaid Other

## 2019-01-06 DIAGNOSIS — Z9119 Patient's noncompliance with other medical treatment and regimen: Secondary | ICD-10-CM

## 2019-01-06 DIAGNOSIS — F329 Major depressive disorder, single episode, unspecified: Secondary | ICD-10-CM | POA: Diagnosis present

## 2019-01-06 DIAGNOSIS — I2510 Atherosclerotic heart disease of native coronary artery without angina pectoris: Secondary | ICD-10-CM | POA: Diagnosis present

## 2019-01-06 DIAGNOSIS — I110 Hypertensive heart disease with heart failure: Secondary | ICD-10-CM | POA: Diagnosis present

## 2019-01-06 DIAGNOSIS — J9621 Acute and chronic respiratory failure with hypoxia: Secondary | ICD-10-CM | POA: Diagnosis present

## 2019-01-06 DIAGNOSIS — Z6841 Body Mass Index (BMI) 40.0 and over, adult: Secondary | ICD-10-CM | POA: Diagnosis not present

## 2019-01-06 DIAGNOSIS — T502X5A Adverse effect of carbonic-anhydrase inhibitors, benzothiadiazides and other diuretics, initial encounter: Secondary | ICD-10-CM | POA: Diagnosis present

## 2019-01-06 DIAGNOSIS — J441 Chronic obstructive pulmonary disease with (acute) exacerbation: Secondary | ICD-10-CM | POA: Diagnosis present

## 2019-01-06 DIAGNOSIS — E785 Hyperlipidemia, unspecified: Secondary | ICD-10-CM | POA: Diagnosis present

## 2019-01-06 DIAGNOSIS — I214 Non-ST elevation (NSTEMI) myocardial infarction: Secondary | ICD-10-CM | POA: Diagnosis not present

## 2019-01-06 DIAGNOSIS — Z20828 Contact with and (suspected) exposure to other viral communicable diseases: Secondary | ICD-10-CM | POA: Diagnosis present

## 2019-01-06 DIAGNOSIS — J9601 Acute respiratory failure with hypoxia: Secondary | ICD-10-CM | POA: Diagnosis present

## 2019-01-06 DIAGNOSIS — N179 Acute kidney failure, unspecified: Secondary | ICD-10-CM | POA: Diagnosis present

## 2019-01-06 DIAGNOSIS — Z794 Long term (current) use of insulin: Secondary | ICD-10-CM

## 2019-01-06 DIAGNOSIS — K219 Gastro-esophageal reflux disease without esophagitis: Secondary | ICD-10-CM | POA: Diagnosis present

## 2019-01-06 DIAGNOSIS — J9602 Acute respiratory failure with hypercapnia: Secondary | ICD-10-CM | POA: Diagnosis not present

## 2019-01-06 DIAGNOSIS — R7989 Other specified abnormal findings of blood chemistry: Secondary | ICD-10-CM | POA: Diagnosis present

## 2019-01-06 DIAGNOSIS — Z8349 Family history of other endocrine, nutritional and metabolic diseases: Secondary | ICD-10-CM

## 2019-01-06 DIAGNOSIS — G4733 Obstructive sleep apnea (adult) (pediatric): Secondary | ICD-10-CM | POA: Diagnosis present

## 2019-01-06 DIAGNOSIS — E119 Type 2 diabetes mellitus without complications: Secondary | ICD-10-CM | POA: Diagnosis present

## 2019-01-06 DIAGNOSIS — F17210 Nicotine dependence, cigarettes, uncomplicated: Secondary | ICD-10-CM | POA: Diagnosis present

## 2019-01-06 DIAGNOSIS — I169 Hypertensive crisis, unspecified: Secondary | ICD-10-CM | POA: Diagnosis present

## 2019-01-06 DIAGNOSIS — I252 Old myocardial infarction: Secondary | ICD-10-CM

## 2019-01-06 DIAGNOSIS — Z23 Encounter for immunization: Secondary | ICD-10-CM

## 2019-01-06 DIAGNOSIS — I160 Hypertensive urgency: Secondary | ICD-10-CM | POA: Diagnosis present

## 2019-01-06 DIAGNOSIS — E1165 Type 2 diabetes mellitus with hyperglycemia: Secondary | ICD-10-CM | POA: Diagnosis present

## 2019-01-06 DIAGNOSIS — J810 Acute pulmonary edema: Secondary | ICD-10-CM | POA: Diagnosis not present

## 2019-01-06 DIAGNOSIS — I5021 Acute systolic (congestive) heart failure: Secondary | ICD-10-CM | POA: Diagnosis not present

## 2019-01-06 DIAGNOSIS — Q245 Malformation of coronary vessels: Secondary | ICD-10-CM

## 2019-01-06 DIAGNOSIS — D649 Anemia, unspecified: Secondary | ICD-10-CM | POA: Diagnosis present

## 2019-01-06 DIAGNOSIS — Z8701 Personal history of pneumonia (recurrent): Secondary | ICD-10-CM

## 2019-01-06 DIAGNOSIS — J9622 Acute and chronic respiratory failure with hypercapnia: Secondary | ICD-10-CM | POA: Diagnosis present

## 2019-01-06 DIAGNOSIS — Z806 Family history of leukemia: Secondary | ICD-10-CM

## 2019-01-06 DIAGNOSIS — E876 Hypokalemia: Secondary | ICD-10-CM | POA: Diagnosis present

## 2019-01-06 DIAGNOSIS — F1410 Cocaine abuse, uncomplicated: Secondary | ICD-10-CM | POA: Diagnosis present

## 2019-01-06 DIAGNOSIS — Z79899 Other long term (current) drug therapy: Secondary | ICD-10-CM

## 2019-01-06 DIAGNOSIS — Z8249 Family history of ischemic heart disease and other diseases of the circulatory system: Secondary | ICD-10-CM

## 2019-01-06 DIAGNOSIS — Z7982 Long term (current) use of aspirin: Secondary | ICD-10-CM

## 2019-01-06 DIAGNOSIS — I5023 Acute on chronic systolic (congestive) heart failure: Secondary | ICD-10-CM | POA: Diagnosis present

## 2019-01-06 DIAGNOSIS — E1129 Type 2 diabetes mellitus with other diabetic kidney complication: Secondary | ICD-10-CM

## 2019-01-06 DIAGNOSIS — T380X5A Adverse effect of glucocorticoids and synthetic analogues, initial encounter: Secondary | ICD-10-CM | POA: Diagnosis present

## 2019-01-06 DIAGNOSIS — R778 Other specified abnormalities of plasma proteins: Secondary | ICD-10-CM | POA: Diagnosis present

## 2019-01-06 DIAGNOSIS — Z833 Family history of diabetes mellitus: Secondary | ICD-10-CM

## 2019-01-06 DIAGNOSIS — I2699 Other pulmonary embolism without acute cor pulmonale: Secondary | ICD-10-CM | POA: Diagnosis present

## 2019-01-06 DIAGNOSIS — Z7951 Long term (current) use of inhaled steroids: Secondary | ICD-10-CM

## 2019-01-06 DIAGNOSIS — Z955 Presence of coronary angioplasty implant and graft: Secondary | ICD-10-CM

## 2019-01-06 DIAGNOSIS — Z79891 Long term (current) use of opiate analgesic: Secondary | ICD-10-CM

## 2019-01-06 HISTORY — DX: Heart failure, unspecified: I50.9

## 2019-01-06 LAB — CBG MONITORING, ED: Glucose-Capillary: 182 mg/dL — ABNORMAL HIGH (ref 70–99)

## 2019-01-06 LAB — GLUCOSE, CAPILLARY
Glucose-Capillary: 117 mg/dL — ABNORMAL HIGH (ref 70–99)
Glucose-Capillary: 121 mg/dL — ABNORMAL HIGH (ref 70–99)
Glucose-Capillary: 124 mg/dL — ABNORMAL HIGH (ref 70–99)
Glucose-Capillary: 140 mg/dL — ABNORMAL HIGH (ref 70–99)
Glucose-Capillary: 141 mg/dL — ABNORMAL HIGH (ref 70–99)
Glucose-Capillary: 152 mg/dL — ABNORMAL HIGH (ref 70–99)

## 2019-01-06 LAB — CBC WITH DIFFERENTIAL/PLATELET
Abs Immature Granulocytes: 0.05 10*3/uL (ref 0.00–0.07)
Basophils Absolute: 0 10*3/uL (ref 0.0–0.1)
Basophils Relative: 0 %
Eosinophils Absolute: 0 10*3/uL (ref 0.0–0.5)
Eosinophils Relative: 0 %
HCT: 44.8 % (ref 39.0–52.0)
Hemoglobin: 14.1 g/dL (ref 13.0–17.0)
Immature Granulocytes: 1 %
Lymphocytes Relative: 6 %
Lymphs Abs: 0.6 10*3/uL — ABNORMAL LOW (ref 0.7–4.0)
MCH: 28.1 pg (ref 26.0–34.0)
MCHC: 31.5 g/dL (ref 30.0–36.0)
MCV: 89.2 fL (ref 80.0–100.0)
Monocytes Absolute: 0.3 10*3/uL (ref 0.1–1.0)
Monocytes Relative: 2 %
Neutro Abs: 10.1 10*3/uL — ABNORMAL HIGH (ref 1.7–7.7)
Neutrophils Relative %: 91 %
Platelets: 299 10*3/uL (ref 150–400)
RBC: 5.02 MIL/uL (ref 4.22–5.81)
RDW: 15.3 % (ref 11.5–15.5)
WBC: 11.1 10*3/uL — ABNORMAL HIGH (ref 4.0–10.5)
nRBC: 0 % (ref 0.0–0.2)

## 2019-01-06 LAB — ECHOCARDIOGRAM LIMITED
Height: 68 in
Weight: 5569.7 oz

## 2019-01-06 LAB — URINALYSIS, ROUTINE W REFLEX MICROSCOPIC
Bilirubin Urine: NEGATIVE
Glucose, UA: 150 mg/dL — AB
Ketones, ur: NEGATIVE mg/dL
Leukocytes,Ua: NEGATIVE
Nitrite: NEGATIVE
Protein, ur: 100 mg/dL — AB
Specific Gravity, Urine: 1.023 (ref 1.005–1.030)
WBC, UA: >50 WBC/hpf — ABNORMAL HIGH (ref 0–5)
pH: 5 (ref 5.0–8.0)

## 2019-01-06 LAB — POCT I-STAT 7, (LYTES, BLD GAS, ICA,H+H)
Acid-base deficit: 3 mmol/L — ABNORMAL HIGH (ref 0.0–2.0)
Acid-base deficit: 2 mmol/L (ref 0.0–2.0)
Bicarbonate: 29.8 mmol/L — ABNORMAL HIGH (ref 20.0–28.0)
Bicarbonate: 26.8 mmol/L (ref 20.0–28.0)
Bicarbonate: 22.5 mmol/L (ref 20.0–28.0)
Calcium, Ion: 1.23 mmol/L (ref 1.15–1.40)
Calcium, Ion: 1.17 mmol/L (ref 1.15–1.40)
Calcium, Ion: 1.11 mmol/L — ABNORMAL LOW (ref 1.15–1.40)
HCT: 47 % (ref 39.0–52.0)
HCT: 43 % (ref 39.0–52.0)
HCT: 45 % (ref 39.0–52.0)
Hemoglobin: 16 g/dL (ref 13.0–17.0)
Hemoglobin: 14.6 g/dL (ref 13.0–17.0)
Hemoglobin: 15.3 g/dL (ref 13.0–17.0)
O2 Saturation: 97 %
O2 Saturation: 98 %
O2 Saturation: 93 %
Patient temperature: 99.1
Patient temperature: 99.1
Patient temperature: 97.6
Potassium: 3.6 mmol/L (ref 3.5–5.1)
Potassium: 3.8 mmol/L (ref 3.5–5.1)
Potassium: 3 mmol/L — ABNORMAL LOW (ref 3.5–5.1)
Sodium: 142 mmol/L (ref 135–145)
Sodium: 142 mmol/L (ref 135–145)
Sodium: 141 mmol/L (ref 135–145)
TCO2: 33 mmol/L — ABNORMAL HIGH (ref 22–32)
TCO2: 29 mmol/L (ref 22–32)
TCO2: 23 mmol/L (ref 22–32)
pCO2 arterial: 92.6 mmHg (ref 32.0–48.0)
pCO2 arterial: 61.2 mmHg — ABNORMAL HIGH (ref 32.0–48.0)
pCO2 arterial: 30.7 mmHg — ABNORMAL LOW (ref 32.0–48.0)
pH, Arterial: 7.117 — CL (ref 7.350–7.450)
pH, Arterial: 7.25 — ABNORMAL LOW (ref 7.350–7.450)
pH, Arterial: 7.471 — ABNORMAL HIGH (ref 7.350–7.450)
pO2, Arterial: 121 mmHg — ABNORMAL HIGH (ref 83.0–108.0)
pO2, Arterial: 126 mmHg — ABNORMAL HIGH (ref 83.0–108.0)
pO2, Arterial: 61 mmHg — ABNORMAL LOW (ref 83.0–108.0)

## 2019-01-06 LAB — RESPIRATORY PANEL BY PCR

## 2019-01-06 LAB — COMPREHENSIVE METABOLIC PANEL
ALT: 37 U/L (ref 0–44)
AST: 65 U/L — ABNORMAL HIGH (ref 15–41)
Albumin: 3.1 g/dL — ABNORMAL LOW (ref 3.5–5.0)
Alkaline Phosphatase: 83 U/L (ref 38–126)
Anion gap: 13 (ref 5–15)
BUN: 14 mg/dL (ref 6–20)
CO2: 20 mmol/L — ABNORMAL LOW (ref 22–32)
Calcium: 8.2 mg/dL — ABNORMAL LOW (ref 8.9–10.3)
Chloride: 106 mmol/L (ref 98–111)
Creatinine, Ser: 1.71 mg/dL — ABNORMAL HIGH (ref 0.61–1.24)
GFR calc Af Amer: 52 mL/min — ABNORMAL LOW (ref >60)
GFR calc non Af Amer: 45 mL/min — ABNORMAL LOW (ref >60)
Glucose, Bld: 206 mg/dL — ABNORMAL HIGH (ref 70–99)
Potassium: 3.9 mmol/L (ref 3.5–5.1)
Sodium: 139 mmol/L (ref 135–145)
Total Bilirubin: 0.9 mg/dL (ref 0.3–1.2)
Total Protein: 6.4 g/dL — ABNORMAL LOW (ref 6.5–8.1)

## 2019-01-06 LAB — MAGNESIUM: Magnesium: 2.6 mg/dL — ABNORMAL HIGH (ref 1.7–2.4)

## 2019-01-06 LAB — RAPID URINE DRUG SCREEN, HOSP PERFORMED
Amphetamines: NOT DETECTED
Barbiturates: NOT DETECTED
Benzodiazepines: POSITIVE — AB
Cocaine: POSITIVE — AB
Opiates: NOT DETECTED
Tetrahydrocannabinol: NOT DETECTED

## 2019-01-06 LAB — I-STAT TROPONIN, ED: Troponin i, poc: 0.19 ng/mL (ref 0.00–0.08)

## 2019-01-06 LAB — PROCALCITONIN: Procalcitonin: <0.1 ng/mL

## 2019-01-06 LAB — BRAIN NATRIURETIC PEPTIDE: B Natriuretic Peptide: 431.5 pg/mL — ABNORMAL HIGH (ref 0.0–100.0)

## 2019-01-06 LAB — TROPONIN I
Troponin I: 1 ng/mL (ref <0.03)
Troponin I: 1.92 ng/mL (ref <0.03)
Troponin I: 4.66 ng/mL (ref <0.03)

## 2019-01-06 LAB — MRSA PCR SCREENING: MRSA by PCR: POSITIVE — AB

## 2019-01-06 LAB — HEPARIN LEVEL (UNFRACTIONATED)
Heparin Unfractionated: 0.33 IU/mL (ref 0.30–0.70)
Heparin Unfractionated: 0.14 IU/mL — ABNORMAL LOW (ref 0.30–0.70)

## 2019-01-06 LAB — SARS CORONAVIRUS 2 BY RT PCR (HOSPITAL ORDER, PERFORMED IN ~~LOC~~ HOSPITAL LAB): SARS Coronavirus 2: NEGATIVE

## 2019-01-06 LAB — PHOSPHORUS: Phosphorus: 6.3 mg/dL — ABNORMAL HIGH (ref 2.5–4.6)

## 2019-01-06 MED ORDER — FENTANYL CITRATE (PF) 100 MCG/2ML IJ SOLN: 50.0000 ug | INTRAMUSCULAR | Status: DC | PRN

## 2019-01-06 MED ORDER — VITAL HIGH PROTEIN PO LIQD: 1000.0000 mL | ORAL | Status: DC

## 2019-01-06 MED ORDER — SODIUM CHLORIDE 0.9 % IV SOLN
INTRAVENOUS | Status: DC | PRN
  Administered 2019-01-07: 10:00:00 490 mL via INTRAVENOUS

## 2019-01-06 MED ORDER — PRO-STAT SUGAR FREE PO LIQD: 30.0000 mL | Freq: Two times a day (BID) | ORAL | Status: DC

## 2019-01-06 MED ORDER — CHLORHEXIDINE GLUCONATE 0.12% ORAL RINSE (MEDLINE KIT)
15.0000 mL | Freq: Two times a day (BID) | OROMUCOSAL | Status: DC
  Administered 2019-01-06 – 2019-01-07 (×3): 15 mL via OROMUCOSAL

## 2019-01-06 MED ORDER — METHYLPREDNISOLONE SODIUM SUCC 125 MG IJ SOLR
60.0000 mg | Freq: Four times a day (QID) | INTRAMUSCULAR | Status: DC
  Filled 2019-01-06 (×2): qty 2

## 2019-01-06 MED ORDER — IPRATROPIUM-ALBUTEROL 0.5-2.5 (3) MG/3ML IN SOLN
3.0000 mL | Freq: Four times a day (QID) | RESPIRATORY_TRACT | Status: DC
  Administered 2019-01-06 – 2019-01-08 (×9): 3 mL via RESPIRATORY_TRACT
  Filled 2019-01-06 (×9): qty 3

## 2019-01-06 MED ORDER — LABETALOL HCL 5 MG/ML IV SOLN
10.0000 mg | INTRAVENOUS | Status: DC | PRN
  Administered 2019-01-06 – 2019-01-10 (×5): 10 mg via INTRAVENOUS
  Filled 2019-01-06 (×4): qty 4

## 2019-01-06 MED ORDER — ONDANSETRON HCL 4 MG/2ML IJ SOLN
4.0000 mg | Freq: Four times a day (QID) | INTRAMUSCULAR | Status: DC | PRN
  Administered 2019-01-07: 12:00:00 4 mg via INTRAVENOUS
  Filled 2019-01-06: qty 2

## 2019-01-06 MED ORDER — PROPOFOL 1000 MG/100ML IV EMUL
INTRAVENOUS | Status: AC
  Administered 2019-01-06: 40 ug/kg/min via INTRAVENOUS
  Filled 2019-01-06: qty 100

## 2019-01-06 MED ORDER — INSULIN ASPART 100 UNIT/ML ~~LOC~~ SOLN
0.0000 [IU] | SUBCUTANEOUS | Status: DC
  Administered 2019-01-06 (×3): 2 [IU] via SUBCUTANEOUS
  Administered 2019-01-06: 06:00:00 3 [IU] via SUBCUTANEOUS
  Administered 2019-01-06 – 2019-01-07 (×3): 2 [IU] via SUBCUTANEOUS
  Administered 2019-01-07: 16:00:00 3 [IU] via SUBCUTANEOUS
  Administered 2019-01-07 – 2019-01-08 (×2): 2 [IU] via SUBCUTANEOUS

## 2019-01-06 MED ORDER — PROPOFOL 1000 MG/100ML IV EMUL
INTRAVENOUS | Status: AC
  Filled 2019-01-06: qty 100

## 2019-01-06 MED ORDER — PROPOFOL 1000 MG/100ML IV EMUL
5.0000 ug/kg/min | INTRAVENOUS | Status: DC
  Administered 2019-01-06: 60 ug/kg/min via INTRAVENOUS
  Administered 2019-01-06: 04:00:00 40 ug/kg/min via INTRAVENOUS
  Administered 2019-01-06 (×2): 50 ug/kg/min via INTRAVENOUS
  Administered 2019-01-06: 45 ug/kg/min via INTRAVENOUS
  Administered 2019-01-06: 50 ug/kg/min via INTRAVENOUS
  Administered 2019-01-06: 05:00:00 60 ug/kg/min via INTRAVENOUS
  Administered 2019-01-06: 30 ug/kg/min via INTRAVENOUS
  Administered 2019-01-06: 60 ug/kg/min via INTRAVENOUS
  Administered 2019-01-06: 40 ug/kg/min via INTRAVENOUS
  Administered 2019-01-06: 21.505 ug/kg/min via INTRAVENOUS
  Administered 2019-01-07: 50 ug/kg/min via INTRAVENOUS
  Administered 2019-01-07: 35 ug/kg/min via INTRAVENOUS
  Administered 2019-01-07: 10:00:00 45 ug/kg/min via INTRAVENOUS
  Administered 2019-01-07: 50 ug/kg/min via INTRAVENOUS
  Administered 2019-01-07: 16:00:00 30 ug/kg/min via INTRAVENOUS
  Administered 2019-01-07 (×2): 50 ug/kg/min via INTRAVENOUS
  Filled 2019-01-06: qty 100
  Filled 2019-01-06: qty 200
  Filled 2019-01-06 (×2): qty 100
  Filled 2019-01-06: qty 200
  Filled 2019-01-06: qty 100
  Filled 2019-01-06: qty 200
  Filled 2019-01-06 (×8): qty 100

## 2019-01-06 MED ORDER — BISACODYL 10 MG RE SUPP: 10.0000 mg | Freq: Every day | RECTAL | Status: DC | PRN

## 2019-01-06 MED ORDER — ACETAMINOPHEN 325 MG PO TABS
650.0000 mg | ORAL_TABLET | ORAL | Status: DC | PRN
  Administered 2019-01-11: 650 mg via ORAL
  Filled 2019-01-06: qty 2

## 2019-01-06 MED ORDER — BUDESONIDE 0.25 MG/2ML IN SUSP: 0.2500 mg | Freq: Four times a day (QID) | RESPIRATORY_TRACT | Status: DC

## 2019-01-06 MED ORDER — CHLORHEXIDINE GLUCONATE CLOTH 2 % EX PADS: 6.0000 | MEDICATED_PAD | Freq: Every day | CUTANEOUS | Status: DC

## 2019-01-06 MED ORDER — POTASSIUM CHLORIDE 10 MEQ/100ML IV SOLN
10.0000 meq | INTRAVENOUS | Status: AC
  Administered 2019-01-06 (×4): 10 meq via INTRAVENOUS
  Filled 2019-01-06 (×2): qty 100

## 2019-01-06 MED ORDER — FUROSEMIDE 10 MG/ML IJ SOLN
40.0000 mg | Freq: Once | INTRAMUSCULAR | Status: AC
  Administered 2019-01-06: 04:00:00 40 mg via INTRAVENOUS
  Filled 2019-01-06: qty 4

## 2019-01-06 MED ORDER — FENTANYL CITRATE (PF) 100 MCG/2ML IJ SOLN
50.0000 ug | INTRAMUSCULAR | Status: DC | PRN
  Administered 2019-01-07 (×4): 100 ug via INTRAVENOUS
  Filled 2019-01-06 (×4): qty 2

## 2019-01-06 MED ORDER — PANTOPRAZOLE SODIUM 40 MG IV SOLR
40.0000 mg | Freq: Every day | INTRAVENOUS | Status: DC
  Administered 2019-01-06 – 2019-01-08 (×3): 40 mg via INTRAVENOUS
  Filled 2019-01-06 (×4): qty 40

## 2019-01-06 MED ORDER — PRO-STAT SUGAR FREE PO LIQD
60.0000 mL | Freq: Four times a day (QID) | ORAL | Status: DC
  Administered 2019-01-06 – 2019-01-07 (×5): 60 mL
  Filled 2019-01-06 (×6): qty 60

## 2019-01-06 MED ORDER — MIDAZOLAM HCL 2 MG/2ML IJ SOLN: 2.0000 mg | INTRAMUSCULAR | Status: DC | PRN

## 2019-01-06 MED ORDER — ETOMIDATE 2 MG/ML IV SOLN
INTRAVENOUS | Status: AC | PRN
  Administered 2019-01-06: 40 mg via INTRAVENOUS

## 2019-01-06 MED ORDER — PERFLUTREN LIPID MICROSPHERE
1.0000 mL | INTRAVENOUS | Status: AC | PRN
  Administered 2019-01-06: 2 mL via INTRAVENOUS
  Filled 2019-01-06: qty 10

## 2019-01-06 MED ORDER — METHYLPREDNISOLONE SODIUM SUCC 125 MG IJ SOLR
125.0000 mg | Freq: Once | INTRAMUSCULAR | Status: AC
  Administered 2019-01-06: 04:00:00 125 mg via INTRAVENOUS
  Filled 2019-01-06: qty 2

## 2019-01-06 MED ORDER — JEVITY 1.2 CAL PO LIQD
1000.0000 mL | ORAL | Status: DC
  Administered 2019-01-06: 14:00:00 1000 mL
  Administered 2019-01-07: 15:00:00
  Filled 2019-01-06 (×3): qty 1000

## 2019-01-06 MED ORDER — HEPARIN BOLUS VIA INFUSION
4000.0000 [IU] | Freq: Once | INTRAVENOUS | Status: AC
  Administered 2019-01-06: 06:00:00 4000 [IU] via INTRAVENOUS
  Filled 2019-01-06: qty 4000

## 2019-01-06 MED ORDER — HEPARIN (PORCINE) 25000 UT/250ML-% IV SOLN
1750.0000 [IU]/h | INTRAVENOUS | Status: DC
  Administered 2019-01-06 (×2): 1300 [IU]/h via INTRAVENOUS
  Administered 2019-01-07 – 2019-01-08 (×2): 1750 [IU]/h via INTRAVENOUS
  Filled 2019-01-06 (×4): qty 250

## 2019-01-06 MED ORDER — FENTANYL CITRATE (PF) 100 MCG/2ML IJ SOLN
100.0000 ug | INTRAMUSCULAR | Status: DC | PRN
  Administered 2019-01-06: 100 ug via INTRAVENOUS
  Filled 2019-01-06: qty 2

## 2019-01-06 MED ORDER — MUPIROCIN 2 % EX OINT
1.0000 "application " | TOPICAL_OINTMENT | Freq: Two times a day (BID) | CUTANEOUS | Status: AC
  Administered 2019-01-06 – 2019-01-10 (×10): 1 via NASAL
  Filled 2019-01-06 (×3): qty 22

## 2019-01-06 MED ORDER — CHLORHEXIDINE GLUCONATE CLOTH 2 % EX PADS
6.0000 | MEDICATED_PAD | Freq: Every day | CUTANEOUS | Status: DC
  Administered 2019-01-07: 6 via TOPICAL

## 2019-01-06 MED ORDER — DOCUSATE SODIUM 50 MG/5ML PO LIQD
100.0000 mg | Freq: Two times a day (BID) | ORAL | Status: DC | PRN
  Filled 2019-01-06: qty 10

## 2019-01-06 MED ORDER — METHYLPREDNISOLONE SODIUM SUCC 125 MG IJ SOLR
40.0000 mg | Freq: Every day | INTRAMUSCULAR | Status: DC
  Administered 2019-01-07 – 2019-01-08 (×2): 40 mg via INTRAVENOUS
  Filled 2019-01-06 (×2): qty 2

## 2019-01-06 MED ORDER — POTASSIUM CHLORIDE 20 MEQ/15ML (10%) PO SOLN
40.0000 meq | Freq: Once | ORAL | Status: AC
  Administered 2019-01-06: 14:00:00 40 meq
  Filled 2019-01-06: qty 30

## 2019-01-06 MED ORDER — SUCCINYLCHOLINE CHLORIDE 20 MG/ML IJ SOLN
INTRAMUSCULAR | Status: AC | PRN
  Administered 2019-01-06: 200 mg via INTRAVENOUS

## 2019-01-06 MED ORDER — HYDRALAZINE HCL 20 MG/ML IJ SOLN
10.0000 mg | INTRAMUSCULAR | Status: DC | PRN
  Administered 2019-01-08 – 2019-01-10 (×2): 10 mg via INTRAVENOUS
  Filled 2019-01-06 (×3): qty 1

## 2019-01-06 MED ORDER — BUDESONIDE 0.25 MG/2ML IN SUSP
0.2500 mg | Freq: Two times a day (BID) | RESPIRATORY_TRACT | Status: DC
  Administered 2019-01-06 – 2019-01-13 (×14): 0.25 mg via RESPIRATORY_TRACT
  Filled 2019-01-06 (×14): qty 2

## 2019-01-06 MED ORDER — SODIUM CHLORIDE 0.9 % IV SOLN
1.0000 g | INTRAVENOUS | Status: DC
  Administered 2019-01-06: 08:00:00 1 g via INTRAVENOUS
  Filled 2019-01-06 (×2): qty 10
  Filled 2019-01-06: qty 1

## 2019-01-06 MED ORDER — SODIUM CHLORIDE 0.9 % IV SOLN
500.0000 mg | INTRAVENOUS | Status: DC
  Administered 2019-01-06: 09:00:00 500 mg via INTRAVENOUS
  Filled 2019-01-06 (×2): qty 500

## 2019-01-06 MED ORDER — FUROSEMIDE 10 MG/ML IJ SOLN
40.0000 mg | Freq: Every day | INTRAMUSCULAR | Status: DC
  Administered 2019-01-06 – 2019-01-07 (×2): 40 mg via INTRAVENOUS
  Filled 2019-01-06 (×3): qty 4

## 2019-01-06 MED ORDER — ORAL CARE MOUTH RINSE
15.0000 mL | OROMUCOSAL | Status: DC
  Administered 2019-01-06 – 2019-01-07 (×15): 15 mL via OROMUCOSAL

## 2019-01-06 NOTE — Progress Notes (Signed)
Pt had 35 beat VT run at 1330. Potassium 3.0 on last draw and Troponin 1.92. CCM provider notified. New order obtained for potassium. VSS. Will continue to monitor.

## 2019-01-06 NOTE — H&P (Signed)
NAME:  Chris Powell, MRN:  [id number], DOB:  05/07/1966, LOS: 0 ADMISSION DATE:  01/06/2019, CONSULTATION DATE:  4/15 REFERRING MD:  Ward - EM , CHIEF COMPLAINT:  Respiratory failure   Brief History   53 yo M found down unresponsive by EMS. Intubated for respiratory distress.   History of present illness   53 year old M found unresponsive by EMS and presented to ED for respiratory distress. Per family, patient became acutely short of breath this evening, and exhibiting agonal respiratory patterns upon EMS arrival[facility] Upon arrival to ED, patient with abnormal ECG without evidence of STEMI, positive troponin. Septic workup initiated however labs were hemolyzed.  Received 125 solumedrol in ED as well as 40 Lasix.   PCCM asked to admit.   Past Medical History  CHF, CAD, HTN, obesity, COPD, HLD, OSA on home CPAP, depression, GERD, anemia   Significant Hospital Events   4/15 Intubated, admitted   Consults:    Procedures:  4/15> ETT>   Significant Diagnostic Tests:   CXR 4/15> cardiomegaly, diffuse pulm edema pattern with vascular congestion   Micro Data:  BCx>  RVP> Tracheal aspirate> COVID-19>   Antimicrobials:  Azithromycin 4/15> Rocephin 4/15>  Interim history/subjective:  Intubated  Objective   Blood pressure 116/86, pulse 87, temperature 99.1 F (37.3 C), temperature source Oral, resp. rate (!) 23, height 5\' 8"  (1.727 m), weight (!) 155 kg, SpO2 98 %.    Vent Mode: PRVC FiO2 (%):  [100 %] 100 % Set Rate:  [18 bmp-26 bmp] 26 bmp Vt Set:  [550 mL] 550 mL PEEP:  [10 cmH20] 10 cmH20 Plateau Pressure:  [30 cmH20] 30 cmH20  No intake or output data in the 24 hours ending 01/06/19 0426 Filed Weights   01/06/19 0311  Weight: (!) 155 kg    Examination: General: Obese adult M, intubated, sedated  HENT: NCAT, ETT secure, anicteric sclera, trachea midline  Lungs: Crackles bilaterally, symmetrical chest expansion, no accessory muscle  recruitment  Cardiovascular: RRR s1s2 no r/g/m no JVD  Abdomen: Obese soft, ndnt, normoactive x4  Extremities: Symmetrical bulk and tone, no obvious deformity, BLE edema  Neuro: Sedated. PERRL.  Skin: clean, dry, warm, intact without rash   Resolved Hospital Problem list     Assessment & Plan:   Acute on Chronic Respiratory Failure, with hypercapnea requiring intubation  COVID-19 rule out  -COPD -OSA on home CPAP -pulm edema s/p Lasix  P Continue MV support FiO2/PEEP for SpO2 88-92 Duonebs, pulmicort S/p 125 solumedrol, Starting solumedrol 60 q6 Follow up COVID-19, tracheal aspirate, RVP  Airborne/contact while COVID-19 pending  Rocephin/Azith for possible COPD exacerbation  ABG now WUA/SBT   CHF HTN Elevated troponin Home Lasix 40 qD, norvasc 10 qD, clonidine 0.3 BID, Hydral 50mg  TID, lisinopril 40 qD P Follow up BNP ECHO after COVID result Lasix 40 qD  Trend troponin Heparin gtt  Holding most home regimen, PRN hydral and PRN labetalol  Check UDS   Hyperglycemia SSI   Acute Kidney Injury Baseline Cr 1-1.1 P Strict I/O Trend BMP   Best practice:  Diet: NPO Pain/Anxiety/Delirium protocol (if indicated): propofol, PRN fent/versed  VAP protocol (if indicated): yes DVT prophylaxis: Heparin gtt per pharmacy  GI prophylaxis: protonix  Glucose control: SSI  Mobility: bedrest Code Status: full Family Communication: none  Disposition: admit to ICU   Labs   CBC: Recent Labs  Lab 01/06/19 0346  HGB 16.0  HCT 47.0    Basic Metabolic Panel:  Recent Labs  Lab 01/06/19 0346  NA 142  K 3.6   GFR: CrCl cannot be calculated (Patient's most recent lab result is older than the maximum 21 days allowed.). No results for input(s): PROCALCITON, WBC, LATICACIDVEN in the last 168 hours.  Liver Function Tests: No results for input(s): AST, ALT, ALKPHOS, BILITOT, PROT, ALBUMIN in the last 168 hours. No results for input(s): LIPASE, AMYLASE in the last 168  hours. No results for input(s): AMMONIA in the last 168 hours.  ABG    Component Value Date/Time   PHART 7.117 (LL) 01/06/2019 0346   PCO2ART 92.6 (HH) 01/06/2019 0346   PO2ART 121.0 (H) 01/06/2019 0346   HCO3 29.8 (H) 01/06/2019 0346   TCO2 33 (H) 01/06/2019 0346   ACIDBASEDEF 3.0 (H) 01/06/2019 0346   O2SAT 97.0 01/06/2019 0346     Coagulation Profile: No results for input(s): INR, PROTIME in the last 168 hours.  Cardiac Enzymes: No results for input(s): CKTOTAL, CKMB, CKMBINDEX, TROPONINI in the last 168 hours.  HbA1C: HB A1C (BAYER DCA - WAIVED)  Date/Time Value Ref Range Status  04/07/2018 10:52 AM 5.6 <7.0 % Final    Comment:                                          Diabetic Adult            <7.0                                       Healthy Adult        4.3 - 5.7                                                           (DCCT/NGSP) American Diabetes Association's Summary of Glycemic Recommendations for Adults with Diabetes: Hemoglobin A1c <7.0%. More stringent glycemic goals (A1c <6.0%) may further reduce complications at the cost of increased risk of hypoglycemia.   10/31/2017 03:27 PM 5.3 <7.0 % Final    Comment:                                          Diabetic Adult            <7.0                                       Healthy Adult        4.3 - 5.7                                                           (DCCT/NGSP) American Diabetes Association's Summary of Glycemic Recommendations for Adults with Diabetes: Hemoglobin A1c <7.0%. More stringent glycemic goals (A1c <6.0%) may further reduce complications at the cost  of increased risk of hypoglycemia.    Hgb A1c MFr Bld  Date/Time Value Ref Range Status  11/20/2017 01:45 PM 6.0 (H) 4.8 - 5.6 % Final    Comment:             Prediabetes: 5.7 - 6.4          Diabetes: >6.4          Glycemic control for adults with diabetes: <7.0     CBG: Recent Labs  Lab 01/06/19 0318  GLUCAP 182*    Review of  Systems:   Unable to obtain, intubated and sedated   Past Medical History  He,  has a past medical history of Anemia, Arthritis, Asthma, Bone spur of ankle, COPD (chronic obstructive pulmonary disease) (HCC), Coronary artery disease, Depression, Diabetes (HCC), GERD (gastroesophageal reflux disease), Hyperlipidemia, Hypertension, Myocardial infarction (HCC), Pneumonia, Right rotator cuff tear (01/06/2018), Shortness of breath dyspnea, and Sleep apnea.   Surgical History    Past Surgical History:  Procedure Laterality Date  . CARDIAC CATHETERIZATION  12/22/2014  . CORONARY ANGIOPLASTY    . CORONARY STENT PLACEMENT  12/22/2014   LAD  . KNEE SURGERY    . LEFT HEART CATH AND CORONARY ANGIOGRAPHY N/A 12/16/2017   Procedure: LEFT HEART CATH AND CORONARY ANGIOGRAPHY;  Surgeon: Swaziland, Peter M, MD;  Location: [facility] INVASIVE CV LAB;  Service: Cardiovascular;  Laterality: N/A;  . LEFT HEART CATHETERIZATION WITH CORONARY ANGIOGRAM N/A 12/22/2014   Procedure: LEFT HEART CATHETERIZATION WITH CORONARY ANGIOGRAM;  Surgeon: Corky Crafts, MD;  Location: [facility] CATH LAB;  Service: Cardiovascular;  Laterality: N/A;  . SHOULDER ARTHROSCOPY WITH ROTATOR CUFF REPAIR AND SUBACROMIAL DECOMPRESSION Right 01/06/2018   Procedure: RIGHT SHOULDER ARTHROSCOPY  DEBRIDEMENT,ACROMIOPLASTY, ROTATOR CUFF REPAIR;  Surgeon: Teryl Lucy, MD;  Location: [facility] OR;  Service: Orthopedics;  Laterality: Right;     Social History   reports that he has been smoking cigarettes. He has a 30.00 pack-year smoking history. He has never used smokeless tobacco. He reports current alcohol use. He reports previous drug use. Drug: Cocaine.   Family History   His family history includes Cancer in his mother and sister; Diabetes in his father; Heart attack in his father; Hyperlipidemia in his mother; Hypertension in his mother. There is no history of Colon cancer.   Allergies No Known Allergies   Home Medications  Prior to Admission medications    Medication Sig Start Date End Date Taking? Authorizing Provider  albuterol (PROVENTIL HFA;VENTOLIN HFA) 108 (90 Base) MCG/ACT inhaler Inhale 2 puffs into the lungs every 6 (six) hours as needed for wheezing or shortness of breath. 11/13/18   Dettinger, Elige Radon, MD  amLODipine (NORVASC) 10 MG tablet Take 1 tablet (10 mg total) by mouth daily. 10/06/18   Leatha Gilding, MD  aspirin EC 81 MG tablet Take 81 mg by mouth daily.    [provider]  atorvastatin (LIPITOR) 80 MG tablet Take 1 tablet (80 mg total) by mouth daily at 6 PM. 10/06/18   Gherghe, Daylene Katayama, MD  azithromycin (ZITHROMAX) 250 MG tablet Take 2 the first day and then one each day after. 11/13/18   Dettinger, Elige Radon, MD  Cholecalciferol 2000 UNITS CAPS Take 2,000 Units by mouth daily.     [provider]  cloNIDine (CATAPRES) 0.3 MG tablet Take 1 tablet (0.3 mg total) by mouth 2 (two) times daily. 10/06/18   Leatha Gilding, MD  colchicine 0.6 MG tablet Take 1-2 tablets (0.6-1.2 mg total) by  mouth daily. 11/13/18   Dettinger, Elige Radon, MD  fluticasone (FLONASE) 50 MCG/ACT nasal spray Place 2 sprays into both nostrils daily as needed for allergies. 10/06/18   Leatha Gilding, MD  furosemide (LASIX) 40 MG tablet Take 1 tablet (40 mg total) by mouth daily. 10/06/18   Leatha Gilding, MD  hydrALAZINE (APRESOLINE) 50 MG tablet Take 1 tablet (50 mg total) by mouth 3 (three) times daily. 10/06/18   Leatha Gilding, MD  lisinopril (PRINIVIL,ZESTRIL) 40 MG tablet Take 1 tablet (40 mg total) by mouth daily. 10/06/18   Leatha Gilding, MD  nitroGLYCERIN (NITROSTAT) 0.4 MG SL tablet Place 1 tablet (0.4 mg total) under the tongue every 5 (five) minutes as needed for chest pain. 10/06/18   Leatha Gilding, MD  omeprazole (PRILOSEC) 20 MG capsule Take 1 capsule (20 mg total) by mouth daily before breakfast. 10/06/18   Gherghe, Daylene Katayama, MD  Oxycodone HCl 10 MG TABS Take 1-2 tablets (10-20 mg total) by mouth 4 (four) times daily  as needed (for pain). Patient taking differently: Take 10 mg by mouth 4 (four) times daily.  01/06/18   Teryl Lucy, MD  potassium chloride (K-DUR) 10 MEQ tablet Take 1 tablet (10 mEq total) by mouth daily. 10/06/18   Leatha Gilding, MD  sildenafil (REVATIO) 20 MG tablet TAKE 2-5 TABLETS AS NEEDED PRIOR TO SEXUAL ACTIVITY Patient taking differently: Take 40-100 mg by mouth daily as needed (sexual activity).  12/25/17   Nahser, Deloris Ping, MD  spironolactone (ALDACTONE) 50 MG tablet Take 1 tablet (50 mg total) by mouth daily. 10/06/18   Leatha Gilding, MD     Critical care time: 50 minutes     Tessie Fass MSN, AGACNP-BC [facility] [phone] If no answer, [phone] 01/06/2019, 4:26 AM

## 2019-01-06 NOTE — ED Notes (Signed)
ED TO INPATIENT HANDOFF REPORT  ED Nurse Name and Phone #: Romeo Apple [phone]  S Name/Age/Gender Chris Powell 53 y.o. male Room/Bed: RESUSC/RESUSC  Code Status   Code Status: Full Code  Home/SNF/Other Home {Triage Complete: Triage complete  Chief Complaint STEMI - KING AIRWAY  Triage Note No notes on file   Allergies No Known Allergies  Level of Care/Admitting Diagnosis ED Disposition    ED Disposition Condition Comment   Admit  Hospital Area: [name] [[id number]]  Level of Care: ICU [6]  Diagnosis: Acute respiratory failure (HCC) [518.81.ICD-9-CM]  Admitting Physician: Marcelle Smiling [[id number]]  Attending Physician: Marcelle Smiling [[id number]]  Estimated length of stay: past midnight tomorrow  Certification:: I certify this patient will need inpatient services for at least 2 midnights  Possible Covid Disease Patient Isolation: High Risk (Aerosolizing procedure, nebulizer, intubated/ventilation, CPAP/BiPAP)  Bed request comments: ICU, COVID- rule out  PT Class (Do Not Modify): Inpatient [101]  PT Acc Code (Do Not Modify): Private [1]       B Medical/Surgery History Past Medical History:  Diagnosis Date  . Anemia   . Arthritis   . Asthma   . Bone spur of ankle    right   . COPD (chronic obstructive pulmonary disease) (HCC)   . Coronary artery disease   . Depression   . Diabetes (HCC)    borderline   . GERD (gastroesophageal reflux disease)   . Hyperlipidemia   . Hypertension   . Myocardial infarction (HCC)   . Pneumonia    HX OF PNA  . Right rotator cuff tear 01/06/2018  . Shortness of breath dyspnea   . Sleep apnea    USES CPAP   Past Surgical History:  Procedure Laterality Date  . CARDIAC CATHETERIZATION  12/22/2014  . CORONARY ANGIOPLASTY    . CORONARY STENT PLACEMENT  12/22/2014   LAD  . KNEE SURGERY    . LEFT HEART CATH AND CORONARY ANGIOGRAPHY N/A 12/16/2017   Procedure: LEFT HEART CATH AND CORONARY ANGIOGRAPHY;  Surgeon:  Swaziland, Peter M, MD;  Location: [facility] INVASIVE CV LAB;  Service: Cardiovascular;  Laterality: N/A;  . LEFT HEART CATHETERIZATION WITH CORONARY ANGIOGRAM N/A 12/22/2014   Procedure: LEFT HEART CATHETERIZATION WITH CORONARY ANGIOGRAM;  Surgeon: Corky Crafts, MD;  Location: [facility] CATH LAB;  Service: Cardiovascular;  Laterality: N/A;  . SHOULDER ARTHROSCOPY WITH ROTATOR CUFF REPAIR AND SUBACROMIAL DECOMPRESSION Right 01/06/2018   Procedure: RIGHT SHOULDER ARTHROSCOPY  DEBRIDEMENT,ACROMIOPLASTY, ROTATOR CUFF REPAIR;  Surgeon: Teryl Lucy, MD;  Location: [facility] OR;  Service: Orthopedics;  Laterality: Right;     A IV Location/Drains/Wounds Patient Lines/Drains/Airways Status   Active Line/Drains/Airways    Name:   Placement date:   Placement time:   Site:   Days:   Peripheral IV 01/06/19 Left Hand   01/06/19    0314    Hand   less than 1   Peripheral IV 01/06/19 Right Antecubital   01/06/19    0343    Antecubital   less than 1   Peripheral IV 01/06/19 Left Wrist   01/06/19    0406    Wrist   less than 1   Urethral Catheter Ben, RN Temperature probe 16 Fr.   01/06/19    0406    Temperature probe   less than 1   Airway 7.5 mm   01/06/19    0326     less than 1          Intake/Output Last 24  hours No intake or output data in the 24 hours ending 01/06/19 0448  Labs/Imaging Results for orders placed or performed during the hospital encounter of 01/06/19 (from the past 48 hour(s))  CBG monitoring, ED     Status: Abnormal   Collection Time: 01/06/19  3:18 AM  Result Value Ref Range   Glucose-Capillary 182 (H) 70 - 99 mg/dL  Comprehensive metabolic panel     Status: Abnormal   Collection Time: 01/06/19  3:37 AM  Result Value Ref Range   Sodium 139 135 - 145 mmol/L   Potassium 3.9 3.5 - 5.1 mmol/L   Chloride 106 98 - 111 mmol/L   CO2 20 (L) 22 - 32 mmol/L   Glucose, Bld 206 (H) 70 - 99 mg/dL   BUN 14 6 - 20 mg/dL   Creatinine, Ser 4.741.71 (H) 0.61 - 1.24 mg/dL   Calcium 8.2 (L) 8.9 - 10.3 mg/dL    Total Protein 6.4 (L) 6.5 - 8.1 g/dL   Albumin 3.1 (L) 3.5 - 5.0 g/dL   AST 65 (H) 15 - 41 U/L   ALT 37 0 - 44 U/L   Alkaline Phosphatase 83 38 - 126 U/L   Total Bilirubin 0.9 0.3 - 1.2 mg/dL   GFR calc non Af Amer 45 (L) >60 mL/min   GFR calc Af Amer 52 (L) >60 mL/min   Anion gap 13 5 - 15    Comment: Performed at [facility], [address] [place], [place] [address]  I-STAT 7, (LYTES, BLD GAS, ICA, H+H)     Status: Abnormal   Collection Time: 01/06/19  3:46 AM  Result Value Ref Range   pH, Arterial 7.117 (LL) 7.350 - 7.450   pCO2 arterial 92.6 (HH) 32.0 - 48.0 mmHg   pO2, Arterial 121.0 (H) 83.0 - 108.0 mmHg   Bicarbonate 29.8 (H) 20.0 - 28.0 mmol/L   TCO2 33 (H) 22 - 32 mmol/L   O2 Saturation 97.0 %   Acid-base deficit 3.0 (H) 0.0 - 2.0 mmol/L   Sodium 142 135 - 145 mmol/L   Potassium 3.6 3.5 - 5.1 mmol/L   Calcium, Ion 1.23 1.15 - 1.40 mmol/L   HCT 47.0 39.0 - 52.0 %   Hemoglobin 16.0 13.0 - 17.0 g/dL   Patient temperature 38.799.1 F    Collection site RADIAL, ALLEN'S TEST ACCEPTABLE    Drawn by RT    Sample type ARTERIAL    Comment NOTIFIED PHYSICIAN   I-stat troponin, ED     Status: Abnormal   Collection Time: 01/06/19  3:47 AM  Result Value Ref Range   Troponin i, poc 0.19 (HH) 0.00 - 0.08 ng/mL   Comment NOTIFIED PHYSICIAN    Comment 3            Comment: Due to the release kinetics of cTnI, a negative result within the first hours of the onset of symptoms does not rule out myocardial infarction with certainty. If myocardial infarction is still suspected, repeat the test at appropriate intervals.    Dg Chest Portable 1 View  Result Date: 01/06/2019 CLINICAL DATA:  Status post intubation EXAM: PORTABLE CHEST 1 VIEW COMPARISON:  10/05/2018 FINDINGS: Endotracheal tube and gastric catheter are noted in satisfactory position. Cardiac shadow is mildly enlarged. Increased central vascular congestion is noted with diffuse pulmonary edema. No sizable effusion is  noted. No bony abnormality is seen. IMPRESSION: Changes consistent with CHF and pulmonary edema. Electronically Signed   By: Eulah PontMark  Lukens M.D.  On: 01/06/2019 03:41    Pending Labs Unresulted Labs (From admission, onward)    Start     Ordered   01/06/19 0700  Troponin I - Now Then Q6H  Now then every 6 hours,   R     01/06/19 0441   01/06/19 0500  Magnesium  Tomorrow morning,   R     01/06/19 0439   01/06/19 0500  Phosphorus  Tomorrow morning,   R     01/06/19 0439   01/06/19 0443  Respiratory Panel by PCR  (Respiratory virus panel with precautions)  Once,   R     01/06/19 0442   01/06/19 0443  Culture, respiratory (non-expectorated)  Once,   R     01/06/19 0442   01/06/19 0437  Procalcitonin  Once,   R     01/06/19 0439   01/06/19 0430  Brain natriuretic peptide  Once,   STAT     01/06/19 0430   01/06/19 0348  Urinalysis, Routine w reflex microscopic  Once,   R     01/06/19 0347   01/06/19 0346  Blood gas, arterial  Once,   R     01/06/19 0345   01/06/19 0336  SARS Coronavirus 2 [facility] order, Performed in [facility] hospital lab)  (Novel Coronavirus, [facility] Order))  ONCE - STAT,   R     01/06/19 0335   01/06/19 0332  Urine culture  ONCE - STAT,   STAT     01/06/19 0331   01/06/19 0331  Blood culture (routine x 2)  BLOOD CULTURE X 2,   STAT     01/06/19 0331   01/06/19 0330  CBC with Differential  ONCE - STAT,   STAT     01/06/19 0331          Vitals/Pain Today's Vitals   01/06/19 0400 01/06/19 0415 01/06/19 0430 01/06/19 0445  BP: 116/86 (!) 140/98 (!) 137/96 (!) 156/108  Pulse: 87 85 81 82  Resp: (!) 23 (!) 26 (!) 26 20  Temp:      TempSrc:      SpO2: 98% 100% 100% 99%  Weight:      Height:      PainSc:        Isolation Precautions Droplet and Contact precautions  Medications Medications  propofol (DIPRIVAN) 1000 MG/100ML infusion (40 mcg/kg/min  155 kg Intravenous New Bag/Given 01/06/19 0349)  budesonide (PULMICORT) nebulizer solution 0.25 mg  (has no administration in time range)  ipratropium-albuterol (DUONEB) 0.5-2.5 (3) MG/3ML nebulizer solution 3 mL (has no administration in time range)  pantoprazole (PROTONIX) injection 40 mg (has no administration in time range)  ondansetron (ZOFRAN) injection 4 mg (has no administration in time range)  acetaminophen (TYLENOL) tablet 650 mg (has no administration in time range)  fentaNYL (SUBLIMAZE) injection 50 mcg (has no administration in time range)  fentaNYL (SUBLIMAZE) injection 50-200 mcg (has no administration in time range)  midazolam (VERSED) injection 2 mg (has no administration in time range)  midazolam (VERSED) injection 2 mg (has no administration in time range)  docusate (COLACE) 50 MG/5ML liquid 100 mg (has no administration in time range)  bisacodyl (DULCOLAX) suppository 10 mg (has no administration in time range)  insulin aspart (novoLOG) injection 0-15 Units (has no administration in time range)  furosemide (LASIX) injection 40 mg (has no administration in time range)  etomidate (AMIDATE) injection (40 mg Intravenous Given 01/06/19 0319)  succinylcholine (ANECTINE) injection (200 mg Intravenous Given 01/06/19 0319)  furosemide (LASIX) injection 40 mg (40 mg Intravenous Given 01/06/19 0409)  methylPREDNISolone sodium succinate (SOLU-MEDROL) 125 mg/2 mL injection 125 mg (125 mg Intravenous Given 01/06/19 0409)    Mobility non-ambulatory     Focused Assessments Pulmonary Assessment Handoff:  Lung sounds: Bilateral Breath Sounds: Diminished O2 Device: Ventilator        R Recommendations: See Admitting Provider Note  Report given to:   Additional Notes: N/A

## 2019-01-06 NOTE — ED Provider Notes (Addendum)
CHIEF COMPLAINT: Shortness of breath  HPI: Patient is a 53 year old male with history of CAD, CHF, hypertension, COPD, morbid obesity who presents to the emergency department with [facility] for respiratory distress, respiratory failure.  Patient's family told EMS that he suddenly became short of breath tonight.  On EMS arrival patient had agonal respirations with sats in the 60s on room air.  A King airway was placed.  He never lost pulses.  Very agitated in route.  Given 4 mg of IV Versed.  Cath 12/16/2017:   Previously placed Prox LAD-1 stent (unknown type) is widely patent.  Prox LAD-2 lesion is 50% stenosed.  1st Mrg lesion is 30% stenosed.  There is mild left ventricular systolic dysfunction.  The left ventricular ejection fraction is 45-50% by visual estimate.  LV end diastolic pressure is mildly elevated.   1. Nonobstructive CAD. Prior stent in the proximal LAD is widely patent.  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 Mild global LV dysfunction. EF 45-50%. 4. Mildly elevated LVEDP.  ROS: Level 5 caveat for respiratory failure  PAST MEDICAL HISTORY/PAST SURGICAL HISTORY:  Past Medical History:  Diagnosis Date  . Anemia   . Arthritis   . Asthma   . Bone spur of ankle    right   . COPD (chronic obstructive pulmonary disease) (HCC)   . Coronary artery disease   . Depression   . Diabetes (HCC)    borderline   . GERD (gastroesophageal reflux disease)   . Hyperlipidemia   . Hypertension   . Myocardial infarction (HCC)   . Pneumonia    HX OF PNA  . Right rotator cuff tear 01/06/2018  . Shortness of breath dyspnea   . Sleep apnea    USES CPAP    MEDICATIONS:  Prior to Admission medications   Medication Sig Start Date End Date Taking? Authorizing Provider  albuterol (PROVENTIL HFA;VENTOLIN HFA) 108 (90 Base) MCG/ACT inhaler Inhale 2 puffs into the lungs every 6 (six) hours as needed for wheezing or shortness of breath. 11/13/18   Dettinger, Elige Radon, MD  amLODipine (NORVASC) 10 MG tablet Take 1 tablet (10 mg total) by mouth daily. 10/06/18   Leatha Gilding, MD  aspirin EC 81 MG tablet Take 81 mg by mouth daily.    [provider]  atorvastatin (LIPITOR) 80 MG tablet Take 1 tablet (80 mg total) by mouth daily at 6 PM. 10/06/18   Gherghe, Daylene Katayama, MD  azithromycin (ZITHROMAX) 250 MG tablet Take 2 the first day and then one each day after. 11/13/18   Dettinger, Elige Radon, MD  Cholecalciferol 2000 UNITS CAPS Take 2,000 Units by mouth daily.     [provider]  cloNIDine (CATAPRES) 0.3 MG tablet Take 1 tablet (0.3 mg total) by mouth 2 (two) times daily. 10/06/18   Leatha Gilding, MD  colchicine 0.6 MG tablet Take 1-2 tablets (0.6-1.2 mg total) by mouth daily. 11/13/18   Dettinger, Elige Radon, MD  fluticasone (FLONASE) 50 MCG/ACT nasal spray Place 2 sprays into both nostrils daily as needed for allergies. 10/06/18   Leatha Gilding, MD  furosemide (LASIX) 40 MG tablet Take 1 tablet (40 mg total) by mouth daily. 10/06/18   Leatha Gilding, MD  hydrALAZINE (APRESOLINE) 50 MG tablet Take 1 tablet (50 mg total) by mouth 3 (three) times daily. 10/06/18   Leatha Gilding, MD  lisinopril (PRINIVIL,ZESTRIL) 40 MG tablet Take 1 tablet (40 mg total) by mouth  daily. 10/06/18   Leatha GildingGherghe, Costin M, MD  nitroGLYCERIN (NITROSTAT) 0.4 MG SL tablet Place 1 tablet (0.4 mg total) under the tongue every 5 (five) minutes as needed for chest pain. 10/06/18   Leatha GildingGherghe, Costin M, MD  omeprazole (PRILOSEC) 20 MG capsule Take 1 capsule (20 mg total) by mouth daily before breakfast. 10/06/18   Gherghe, Daylene Katayamaostin M, MD  Oxycodone HCl 10 MG TABS Take 1-2 tablets (10-20 mg total) by mouth 4 (four) times daily as needed (for pain). Patient taking differently: Take 10 mg by mouth 4 (four) times daily.  01/06/18   Teryl LucyLandau, Joshua, MD  potassium chloride (K-DUR) 10 MEQ tablet Take 1 tablet (10 mEq total) by mouth daily. 10/06/18   Leatha GildingGherghe, Costin M, MD  sildenafil  (REVATIO) 20 MG tablet TAKE 2-5 TABLETS AS NEEDED PRIOR TO SEXUAL ACTIVITY Patient taking differently: Take 40-100 mg by mouth daily as needed (sexual activity).  12/25/17   Nahser, Deloris PingPhilip J, MD  spironolactone (ALDACTONE) 50 MG tablet Take 1 tablet (50 mg total) by mouth daily. 10/06/18   Leatha GildingGherghe, Costin M, MD    ALLERGIES:  No Known Allergies  SOCIAL HISTORY:  Social History   Tobacco Use  . Smoking status: Current Every Day Smoker    Packs/day: 1.00    Years: 30.00    Pack years: 30.00    Types: Cigarettes  . Smokeless tobacco: Never Used  . Tobacco comment: " I have  tried vapor cigarettes"  Substance Use Topics  . Alcohol use: Yes    Alcohol/week: 0.0 standard drinks    Comment: occ beer     FAMILY HISTORY: Family History  Problem Relation Age of Onset  . Heart attack Father   . Diabetes Father   . Cancer Mother        leukemia  . Hypertension Mother   . Hyperlipidemia Mother   . Cancer Sister   . Colon cancer Neg Hx     EXAM: BP (!) 173/109   Pulse (!) 108   Temp 99.1 F (37.3 C) (Oral)   Resp 18   Ht 5\' 8"  (1.727 m)   Wt (!) 155 kg   SpO2 96%   BMI 51.96 kg/m  CONSTITUTIONAL: Alert but does not open eyes or follows commands but does reach for the tube with both arms, patient in respiratory distress, being ventilated through a King airway HEAD: Normocephalic EYES: Conjunctivae clear, pupils appear equal, EOMI ENT: normal nose; moist mucous membranes NECK: Supple, no meningismus, no nuchal rigidity, no LAD  CARD: Regular and tachycardic, no murmurs, gallops or rubs appreciated RESP: Patient is tachypneic and trying to breathe over [facility], he has scattered inspiratory wheezes, no rhonchi or rales but exam is limited secondary to his morbid obesity, [facility] ABD/GI: Normal bowel sounds; non-distended; soft, non-tender, no rebound, no guarding, no peritoneal signs, no  hepatosplenomegaly, morbidly obese BACK:  The back appears normal and there are no step-offs or deformities EXT: No edema, no redness or warmth, no major deformity noted SKIN: Normal color for age and race; warm; no rash, patient is diaphoretic NEURO: Patient tries to grab the [place] [facility] with both hands.  He has purposeful movement and does move all extremities.  He does not open his eyes, answer questions or follow commands however.   MEDICAL DECISION MAKING: Patient here in respiratory failure.  Found to be hypoxic with EMS.  King airway placed prior to arrival.  Patient is very agitated and trying to remove the tube but not awake enough to remove the tube and place him on BiPAP.  EMS states the family denied any recent infectious symptoms so therefore suspicion for COVID-19 is low.  He has history of COPD, CHF, CAD.  Initially EMS was concerned for a STEMI.  EKG was sent by EMS to Dr. Allyson Sabal who did not feel this was a STEMI.  Cardiology fellow, Dr. Meredeth Ide, is also at the bedside.  We have repeated an EKG and she agrees that this is not a STEMI.  King airway has been replaced with endotracheal tube.  Patient sedated using propofol drip and as needed fentanyl.  ED PROGRESS: Chest x-ray concerning for diffuse pulmonary edema.  No infiltrate or pneumothorax.  Will give IV Lasix.  It appears he takes 40 mg daily.  Blood gas also shows a respiratory acidosis from hypercapnia.  I suspect that he is retaining secondary to his COPD.  We will increase his rate on his ventilator settings from 18 to 26 and give him IV Solu-Medrol.  He does have some wheezing on exam.  Had an elevated but this is in the setting of respiratory distress.  Also noted to have mild acute renal failure with creatinine of 1.7.  4:05 AM Discussed patient's case with CCM, Dr. Arsenio Loader.  I have recommended admission and patient (and family if present) agree with this plan. Admitting physician will place admission orders.   I reviewed  all nursing notes, vitals, pertinent previous records, EKGs, lab and urine results, imaging (as available).   4:45 AM Talked to patient's brother Lorrin Jackson at [phone].  He states that patient does not wear oxygen at home but does wear CPAP at night but does not use it as he should.  He denies that the patient has had any recent infectious symptoms.  States that the patient was more short of breath tonight suddenly.  He has chronic shortness of breath from his COPD.   Patient is SARS-CoV-2 negative.    EKG Interpretation  Date/Time:  Wednesday January 06 2019 03:18:01 EDT Ventricular Rate:  100 PR Interval:    QRS Duration: 101 QT Interval:  357 QTC Calculation: 461 R Axis:   64 Text Interpretation:  Sinus tachycardia Biatrial enlargement Nonspecific T abnormalities, lateral leads Baseline wander in lead(s) II V1 V3 No significant change since last tracing Confirmed by Rochele Raring [phone]) on 01/06/2019 3:40:08 AM       .Critical Care Performed by: Yetta Marceaux, Layla Maw, DO Authorized by: Roniya Tetro, Layla Maw, DO   Critical care provider statement:    Critical care time (minutes):  75   Critical care was necessary to treat or prevent imminent or life-threatening deterioration of the following conditions:  Cardiac failure and respiratory failure   Critical care was time spent personally by me on the following activities:  Discussions with consultants, evaluation of patient's response to treatment, examination of patient, ordering and performing treatments and interventions, ordering and review of laboratory studies, ordering and review of radiographic studies, pulse oximetry, re-evaluation of patient's condition, obtaining history from patient or surrogate, review of old charts and ventilator management Procedure Name: Intubation Date/Time: 01/06/2019 4:11 AM Performed by: Neill Jurewicz, Layla Maw, DO Pre-anesthesia Checklist: Patient identified, Patient being monitored, Emergency Drugs available,  Timeout performed and Suction available Oxygen Delivery Method: Ambu bag Preoxygenation: Pre-oxygenation with 100% oxygen (through [facility] airway) Induction Type: Rapid sequence Laryngoscope Size: Glidescope and 3 Grade View: Grade III Tube size: 7.5  mm Number of attempts: 1 Placement Confirmation: ETT inserted through vocal cords under direct vision,  CO2 detector and Breath sounds checked- equal and bilateral Secured at: 25 cm Tube secured with: ETT holder Difficulty Due To: Difficulty was anticipated, Difficult Airway- due to large tongue, Difficult Airway- due to reduced neck mobility and Difficult Airway-  due to edematous airway Future Recommendations: Recommend- induction with short-acting agent, and alternative techniques readily available          Bartow Zylstra, Layla Maw, DO 01/06/19 0517    Laurent Cargile, Layla Maw, DO 01/06/19 5374

## 2019-01-06 NOTE — Progress Notes (Signed)
Pharmacy Antibiotic Note  Chris Powell is a 53 y.o. male admitted on 01/06/2019 with pneumonia.  Pharmacy has been consulted for ceftriaxone dosing.  Plan: Ceftriaxone 1 g IV q24h Monitor cultures, LOT, clinical status  Height: 5\' 8"  (172.7 cm) Weight: (!) 341 lb 11.4 oz (155 kg) IBW/kg (Calculated) : 68.4  Temp (24hrs), Avg:99.1 F (37.3 C), Min:99.1 F (37.3 C), Max:99.1 F (37.3 C)  Recent Labs  Lab 01/06/19 0337  CREATININE 1.71*    Estimated Creatinine Clearance: 73.6 mL/min (A) (by C-G formula based on SCr of 1.71 mg/dL (H)).    No Known Allergies  Antimicrobials this admission: Ceftriaxone 4/15 >> Azithromycin 4/15 >>  Dose adjustments this admission: None  Microbiology results: 4/15 BCx: sent 4/15 UCx: sent  4/15 TA/Sputum: sent  4/15 COVID-19: sent 4/15 Respiratory PCR: sent  Thank you for allowing pharmacy to be a part of this patient's care.  Danae Orleans, PharmD PGY1 Pharmacy Resident Phone [phone] 01/06/2019       5:14 AM

## 2019-01-06 NOTE — ED Notes (Signed)
BIB EMS from home, had sudden onset of SOB, family called 911. When EMS arrived pt when agonal and unresponsive. Initially bagged with EMS, eventually king airway placed. Given 4mg  versed en route for pt being combative. EDP and RT at bedside preparing to intubate.

## 2019-01-06 NOTE — Progress Notes (Addendum)
NAME:  Chris HugerMaurice D Conti, MRN:  [id number], DOB:  08/27/1966, LOS: 0 ADMISSION DATE:  01/06/2019, CONSULTATION DATE:  4/15 REFERRING MD:  Ward - EM , CHIEF COMPLAINT:  Respiratory failure   Brief History   53 yo M admitted 4/15 with acute onset SOB, on EMS arrival he was agonal / unresponsive.  Intubated.  UDS + cocaine.  Abnormal ECG without evidence of STEMI.  Rx with heparin gtt.  CXR consistent with pulmonary edema.  COVID negative.  PCCM asked to admit.   Past Medical History  CHF, CAD, HTN, obesity, COPD, HLD, OSA on home CPAP, depression, GERD, anemia   Significant Hospital Events   4/15 Intubated, admitted   Consults:    Procedures:  ETT 4/15 >>   Significant Diagnostic Tests:  CXR 4/15 > cardiomegaly, diffuse pulm edema pattern with vascular congestion   Micro Data:  BCx 4/15 >  RVP 4/15 > Tracheal aspirate 4/15 > COVID-19 4/15 > negative  Antimicrobials:  Azithromycin 4/15 > Rocephin 4/15 >  Interim history/subjective:  RN reports no acute events, remains on heparin / sedation.   Objective   Blood pressure 123/86, pulse 62, temperature (!) 97.3 F (36.3 C), temperature source Oral, resp. rate (!) 26, height 5\' 8"  (1.727 m), weight (!) 157.9 kg, SpO2 100 %.    Vent Mode: PRVC FiO2 (%):  [50 %-100 %] 50 % Set Rate:  [18 bmp-26 bmp] 26 bmp Vt Set:  [550 mL-640 mL] 640 mL PEEP:  [8 cmH20-10 cmH20] 8 cmH20 Plateau Pressure:  [24 cmH20-30 cmH20] 24 cmH20   Intake/Output Summary (Last 24 hours) at 01/06/2019 1011 Last data filed at 01/06/2019 0900 Gross per 24 hour  Intake 143.47 ml  Output 20 ml  Net 123.47 ml   Filed Weights   01/06/19 0311 01/06/19 0600  Weight: (!) 155 kg (!) 157.9 kg    Examination: General: obese adult male lying in bed on vent  HEENT: MM pink/moist, ETT Neuro: sedate CV: s1s2 rrr, no m/r/g PULM: even/non-labored, lungs bilaterally clear/distant  NW:GNFAGI:soft, non-tender, bsx4 active  Extremities: warm/dry, 1-2+ BLE pitting edema   Skin: no rashes or lesions  Resolved Hospital Problem list     Assessment & Plan:   Acute Hypoxemic / Hypercapnic Respiratory Failure   -COVID negative  -in setting of pulmonary edema, cocaine use COPD OSA  -on CPAP  P: Continue PRVC 8cc/kg  Wean PEEP / FiO2 for sats >90% Duoneb + pulmicort  Discontinue airborne precautions > discussed with ID Discontinue abx Reduce solumedrol to 40mg  IV QD Follow intermittent CXR  Daily WUA / SBT  Will need QHS CPAP post extubation   Acute Pulmonary Edema / Acute Decompensated Systolic CHF  -LVEF 2019 45-50% -UDS positive for cocaine  HTN Elevated troponin / Demand Ischemia  P: Continue IV lasix as ordered  Hold home norvasc, clonidine, hydralazine, lisinopril, po lasix Trend troponin  Likely can discontinue heparin tomorrow 4/16  Follow up ECHO  Drug counseling when extubated  Hyperglycemia P: SSI   Acute Kidney Injury -baseline Cr 1-1.1 P: Trend BMP / urinary output Replace electrolytes as indicated Avoid nephrotoxic agents, ensure adequate renal perfusion  At Risk Malnutrition  P: Begin TF   Best practice:  Diet: NPO Pain/Anxiety/Delirium protocol (if indicated): propofol, PRN fent/versed  VAP protocol (if indicated): yes DVT prophylaxis: Heparin gtt per pharmacy  GI prophylaxis: protonix  Glucose control: SSI  Mobility: bedrest Code Status: full Family Communication: none  Disposition: admit to ICU   Labs  CBC: Recent Labs  Lab 01/06/19 0346 01/06/19 0445 01/06/19 0824  WBC  --   --  11.1*  NEUTROABS  --   --  10.1*  HGB 16.0 14.6 14.1  HCT 47.0 43.0 44.8  MCV  --   --  89.2  PLT  --   --  299    Basic Metabolic Panel: Recent Labs  Lab 01/06/19 0337 01/06/19 0346 01/06/19 0442 01/06/19 0445  NA 139 142  --  142  K 3.9 3.6  --  3.8  CL 106  --   --   --   CO2 20*  --   --   --   GLUCOSE 206*  --   --   --   BUN 14  --   --   --   CREATININE 1.71*  --   --   --   CALCIUM 8.2*  --   --    --   MG  --   --  2.6*  --   PHOS  --   --  6.3*  --    GFR: Estimated Creatinine Clearance: 74.5 mL/min (A) (by C-G formula based on SCr of 1.71 mg/dL (H)). Recent Labs  Lab 01/06/19 0442 01/06/19 0824  PROCALCITON <0.10  --   WBC  --  11.1*    Liver Function Tests: Recent Labs  Lab 01/06/19 0337  AST 65*  ALT 37  ALKPHOS 83  BILITOT 0.9  PROT 6.4*  ALBUMIN 3.1*   No results for input(s): LIPASE, AMYLASE in the last 168 hours. No results for input(s): AMMONIA in the last 168 hours.  ABG    Component Value Date/Time   PHART 7.250 (L) 01/06/2019 0445   PCO2ART 61.2 (H) 01/06/2019 0445   PO2ART 126.0 (H) 01/06/2019 0445   HCO3 26.8 01/06/2019 0445   TCO2 29 01/06/2019 0445   ACIDBASEDEF 2.0 01/06/2019 0445   O2SAT 98.0 01/06/2019 0445     Coagulation Profile: No results for input(s): INR, PROTIME in the last 168 hours.  Cardiac Enzymes: Recent Labs  Lab 01/06/19 0710  TROPONINI 1.00*    HbA1C: HB A1C (BAYER DCA - WAIVED)  Date/Time Value Ref Range Status  04/07/2018 10:52 AM 5.6 <7.0 % Final    Comment:                                          Diabetic Adult            <7.0                                       Healthy Adult        4.3 - 5.7                                                           (DCCT/NGSP) American Diabetes Association's Summary of Glycemic Recommendations for Adults with Diabetes: Hemoglobin A1c <7.0%. More stringent glycemic goals (A1c <6.0%) may further reduce complications at the cost of increased risk of hypoglycemia.   10/31/2017 03:27 PM 5.3 <7.0 % Final    Comment:  Diabetic Adult            <7.0                                       Healthy Adult        4.3 - 5.7                                                           (DCCT/NGSP) American Diabetes Association's Summary of Glycemic Recommendations for Adults with Diabetes: Hemoglobin A1c <7.0%. More stringent glycemic goals (A1c  <6.0%) may further reduce complications at the cost of increased risk of hypoglycemia.    Hgb A1c MFr Bld  Date/Time Value Ref Range Status  11/20/2017 01:45 PM 6.0 (H) 4.8 - 5.6 % Final    Comment:             Prediabetes: 5.7 - 6.4          Diabetes: >6.4          Glycemic control for adults with diabetes: <7.0     CBG: Recent Labs  Lab 01/06/19 0318 01/06/19 0540 01/06/19 0744  GLUCAP 182* 152* 141*     Critical care time: 35 minutes    Canary Brim, NP-C [facility] Pgr: [phone] or if no answer [phone] 01/06/2019, 10:12 AM

## 2019-01-06 NOTE — Progress Notes (Addendum)
Initial Nutrition Assessment  DOCUMENTATION CODES:   Morbid obesity  INTERVENTION:    Jevity 1.2 at 20 ml/h (480 ml per day)  Pro-stat 60 ml QID  Provides 1376 kcal (2128 kcal total with lipids from propofol), 147 gm protein, 394 ml free water daily  NUTRITION DIAGNOSIS:   Inadequate oral intake related to inability to eat as evidenced by NPO status.  GOAL:   Provide needs based on ASPEN/SCCM guidelines  MONITOR:   Vent status, TF tolerance, Labs, Skin  REASON FOR ASSESSMENT:   Ventilator, Consult Enteral/tube feeding initiation and management  ASSESSMENT:   53 yo male with PMH of HTN, COPD, CAD, HLD, borderline DM who was admitted with SOB r/t cocaine use with pulmonary edema. COVID negative. Intubated on admission.  Received MD Consult for TF initiation and management. OGT in place.  Patient is currently intubated on ventilator support Temp (24hrs), Avg:98.2 F (36.8 C), Min:97.3 F (36.3 C), Max:99.1 F (37.3 C)  Propofol: 28.5 ml/hr providing 752 kcal from lipid   Labs reviewed. Creatinine 1.71 (H) CBG's: [phone]  Medications reviewed and include Lasix, Novolog, Solumedrol, and propofol.  NUTRITION - FOCUSED PHYSICAL EXAM:  unable to assess  Diet Order:   Diet Order            Diet NPO time specified  Diet effective now              EDUCATION NEEDS:   No education needs have been identified at this time  Skin:  Skin Assessment: Reviewed RN Assessment  Last BM:  none documented since admission  Height:   Ht Readings from Last 1 Encounters:  01/06/19 5\' 8"  (1.727 m)    Weight:   Wt Readings from Last 1 Encounters:  01/06/19 (!) 157.9 kg    Ideal Body Weight:  70 kg  BMI:  Body mass index is 52.93 kg/m.  Estimated Nutritional Needs:   Kcal:  1540-1750  Protein:  140-175 gm  Fluid:  2.1 L    Joaquin Courts, RD, LDN, CNSC Pager [phone] After Hours Pager [phone]

## 2019-01-06 NOTE — Progress Notes (Signed)
ANTICOAGULATION CONSULT NOTE  Pharmacy Consult for heparin Indication: chest pain/ACS  No Known Allergies  Patient Measurements: Height: '5\' 8"'  (172.7 cm) Weight: (!) 348 lb 1.7 oz (157.9 kg) IBW/kg (Calculated) : 68.4 Heparin Dosing Weight: 106.4 kg  Vital Signs: Temp: 98.7 F (37.1 C) (04/15 1617) Temp Source: Oral (04/15 1617) BP: 150/95 (04/15 2000) Pulse Rate: 74 (04/15 2000)  Labs: Recent Labs    01/06/19 0337  01/06/19 0445 01/06/19 0710 01/06/19 0824 01/06/19 1135 01/06/19 1237 01/06/19 1251 01/06/19 1827 01/06/19 1954  HGB  --    < > 14.6  --  14.1  --  15.3  --   --   --   HCT  --    < > 43.0  --  44.8  --  45.0  --   --   --   PLT  --   --   --   --  299  --   --   --   --   --   HEPARINUNFRC  --   --   --   --   --  0.33  --   --   --  0.14*  CREATININE 1.71*  --   --   --   --   --   --   --   --   --   TROPONINI  --   --   --  1.00*  --   --   --  1.92* 4.66*  --    < > = values in this interval not displayed.    Estimated Creatinine Clearance: 74.5 mL/min (A) (by C-G formula based on SCr of 1.71 mg/dL (H)).   Medical History: Past Medical History:  Diagnosis Date  . Anemia   . Arthritis   . Asthma   . Bone spur of ankle    right   . COPD (chronic obstructive pulmonary disease) ([place])   . Coronary artery disease   . Depression   . Diabetes ([place])    borderline   . GERD (gastroesophageal reflux disease)   . Hyperlipidemia   . Hypertension   . Myocardial infarction ([place])   . Pneumonia    HX OF PNA  . Right rotator cuff tear 01/06/2018  . Shortness of breath dyspnea   . Sleep apnea    USES CPAP    Medications:  Scheduled:  . budesonide (PULMICORT) nebulizer solution  0.25 mg Nebulization BID  . chlorhexidine gluconate (MEDLINE KIT)  15 mL Mouth Rinse BID  . [START ON 01/07/2019] Chlorhexidine Gluconate Cloth  6 each Topical Q0600  . feeding supplement (PRO-STAT SUGAR FREE 64)  60 mL Per Tube QID  . furosemide  40 mg Intravenous Daily   . insulin aspart  0-15 Units Subcutaneous Q4H  . ipratropium-albuterol  3 mL Nebulization Q6H  . mouth rinse  15 mL Mouth Rinse 10 times per day  . [START ON 01/07/2019] methylPREDNISolone (SOLU-MEDROL) injection  40 mg Intravenous Daily  . mupirocin ointment  1 application Nasal BID  . pantoprazole (PROTONIX) IV  40 mg Intravenous QHS   Infusions:  . sodium chloride    . feeding supplement (JEVITY 1.2 CAL) 20 mL/hr at 01/06/19 2000  . heparin 1,300 Units/hr (01/06/19 2043)  . propofol (DIPRIVAN) infusion 50 mcg/kg/min (01/06/19 2000)    Assessment: 53 yo M with a history of MI in March 2016 (proximal LAD stent placed). Pharmacy consulted to start IV heparin for ACS/STEMI. Patient not on anticoagulation PTA.  Troponin trending up to 4.66, will continue heparin.  Hemoglobin 14.1, platelets 299. No bleeding noted. AntiXa level now below goal at 0.14.   Goal of Therapy:  Heparin level 0.3-0.7 units/ml Monitor platelets by anticoagulation protocol: Yes   Plan:  Increase heparin IV to 1,600 units/hr Daily heparin level and CBC  Monitor for signs/symptoms of bleeding  Erin Hearing PharmD., BCPS Clinical Pharmacist 01/06/2019 8:57 PM

## 2019-01-06 NOTE — Progress Notes (Signed)
ANTICOAGULATION CONSULT NOTE - Initial Consult  Pharmacy Consult for heparin Indication: chest pain/ACS  No Known Allergies  Patient Measurements: Height: 5' 8" (172.7 cm) Weight: (!) 348 lb 1.7 oz (157.9 kg) IBW/kg (Calculated) : 68.4 Heparin Dosing Weight: 106.4 kg  Vital Signs: Temp: 97.3 F (36.3 C) (04/15 0800) Temp Source: Oral (04/15 0800) BP: 171/119 (04/15 1155) Pulse Rate: 75 (04/15 1155)  Labs: Recent Labs    01/06/19 0337  01/06/19 0346 01/06/19 0445 01/06/19 0710 01/06/19 0824 01/06/19 1135  HGB  --    < > 16.0 14.6  --  14.1  --   HCT  --   --  47.0 43.0  --  44.8  --   PLT  --   --   --   --   --  299  --   HEPARINUNFRC  --   --   --   --   --   --  0.33  CREATININE 1.71*  --   --   --   --   --   --   TROPONINI  --   --   --   --  1.00*  --   --    < > = values in this interval not displayed.    Estimated Creatinine Clearance: 74.5 mL/min (A) (by C-G formula based on SCr of 1.71 mg/dL (H)).   Medical History: Past Medical History:  Diagnosis Date  . Anemia   . Arthritis   . Asthma   . Bone spur of ankle    right   . COPD (chronic obstructive pulmonary disease) ([place])   . Coronary artery disease   . Depression   . Diabetes ([place])    borderline   . GERD (gastroesophageal reflux disease)   . Hyperlipidemia   . Hypertension   . Myocardial infarction ([place])   . Pneumonia    HX OF PNA  . Right rotator cuff tear 01/06/2018  . Shortness of breath dyspnea   . Sleep apnea    USES CPAP    Medications:  Scheduled:  . budesonide (PULMICORT) nebulizer solution  0.25 mg Nebulization BID  . chlorhexidine gluconate (MEDLINE KIT)  15 mL Mouth Rinse BID  . feeding supplement (PRO-STAT SUGAR FREE 64)  60 mL Per Tube QID  . furosemide  40 mg Intravenous Daily  . insulin aspart  0-15 Units Subcutaneous Q4H  . ipratropium-albuterol  3 mL Nebulization Q6H  . mouth rinse  15 mL Mouth Rinse 10 times per day  . [START ON 01/07/2019] methylPREDNISolone  (SOLU-MEDROL) injection  40 mg Intravenous Daily  . pantoprazole (PROTONIX) IV  40 mg Intravenous QHS   Infusions:  . sodium chloride    . feeding supplement (JEVITY 1.2 CAL)    . heparin 1,300 Units/hr (01/06/19 1041)  . propofol (DIPRIVAN) infusion 80 mcg/kg/min (01/06/19 1132)  . propofol      Assessment: 53 yo M with a history of MI in March 2016 (proximal LAD stent placed). Pharmacy consulted to start IV heparin for ACS/STEMI. Patient not on anticoagulation PTA.   Troponin elevated at 1, repeat troponin at 1700. Per CCM, if trend is flat consider d/c of heparin.  Hemoglobin 14.1, platelets 299. No bleeding noted. AntiXa level therapeutic at 0.33.   Goal of Therapy:  Heparin level 0.3-0.7 units/ml Monitor platelets by anticoagulation protocol: Yes   Plan:  Continue heparin IV at 1,300 units/hr Check confirmatory 6 hour heparin level Daily heparin level and CBC  Monitor for signs/symptoms  of bleeding  Azzie Roup D PGY1 Pharmacy Resident  Phone [phone] Please use [facility] for clinical pharmacists numbers  01/06/2019      12:10 PM

## 2019-01-06 NOTE — Progress Notes (Signed)
Patient transported from ED [facility] to 2M12 with no complications.

## 2019-01-06 NOTE — Progress Notes (Signed)
ANTICOAGULATION CONSULT NOTE - Initial Consult  Pharmacy Consult for heparin Indication: chest pain/ACS  No Known Allergies  Patient Measurements: Height: 5\' 8"  (172.7 cm) Weight: (!) 341 lb 11.4 oz (155 kg) IBW/kg (Calculated) : 68.4 Heparin Dosing Weight: 106.4 kg  Vital Signs: Temp: 99.1 F (37.3 C) (04/15 0318) Temp Source: Oral (04/15 0318) BP: 156/108 (04/15 0445) Pulse Rate: 82 (04/15 0445)  Labs: Recent Labs    01/06/19 0337 01/06/19 0346 01/06/19 0445  HGB  --  16.0 14.6  HCT  --  47.0 43.0  CREATININE 1.71*  --   --     Estimated Creatinine Clearance: 73.6 mL/min (A) (by C-G formula based on SCr of 1.71 mg/dL (H)).   Medical History: Past Medical History:  Diagnosis Date  . Anemia   . Arthritis   . Asthma   . Bone spur of ankle    right   . COPD (chronic obstructive pulmonary disease) ([facility])   . Coronary artery disease   . Depression   . Diabetes ([facility])    borderline   . GERD (gastroesophageal reflux disease)   . Hyperlipidemia   . Hypertension   . Myocardial infarction ([facility])   . Pneumonia    HX OF PNA  . Right rotator cuff tear 01/06/2018  . Shortness of breath dyspnea   . Sleep apnea    USES CPAP    Medications:  Scheduled:  . budesonide (PULMICORT) nebulizer solution  0.25 mg Nebulization BID  . furosemide  40 mg Intravenous Daily  . insulin aspart  0-15 Units Subcutaneous Q4H  . ipratropium-albuterol  3 mL Nebulization Q6H  . pantoprazole (PROTONIX) IV  40 mg Intravenous QHS   Infusions:  . propofol (DIPRIVAN) infusion 40 mcg/kg/min (01/06/19 0349)    Assessment: 53 yo M with a history of MI in March 2016 (proximal LAD stent placed). Pharmacy consulted to start IV heparin for ACS/STEMI with troponin elevated at 0.19. Patient not on anticoagulation PTA.   Goal of Therapy:  Heparin level 0.3-0.7 units/ml Monitor platelets by anticoagulation protocol: Yes   Plan:  Heparin IV 4,000 units x 1 followed by heparin IV at 1,300  units/hr Check 6 hour heparin level Daily heparin level and CBC  Monitor for signs/symptoms of bleeding  Danae Orleans, PharmD PGY1 Pharmacy Resident Phone [phone] 01/06/2019       4:58 AM

## 2019-01-06 NOTE — Progress Notes (Signed)
  Echocardiogram 2D Echocardiogram has been performed.  Chris Powell 01/06/2019, 12:05 PM

## 2019-01-06 NOTE — Progress Notes (Signed)
RT NOTE: RT transported patient from 2M12 to 2H15 with RN without complications. VS stable. RT will continue to monitor.

## 2019-01-07 DIAGNOSIS — I5021 Acute systolic (congestive) heart failure: Secondary | ICD-10-CM

## 2019-01-07 LAB — BASIC METABOLIC PANEL
Anion gap: 11 (ref 5–15)
BUN: 21 mg/dL — ABNORMAL HIGH (ref 6–20)
CO2: 24 mmol/L (ref 22–32)
Calcium: 8.8 mg/dL — ABNORMAL LOW (ref 8.9–10.3)
Chloride: 105 mmol/L (ref 98–111)
Creatinine, Ser: 2.15 mg/dL — ABNORMAL HIGH (ref 0.61–1.24)
GFR calc Af Amer: 40 mL/min — ABNORMAL LOW (ref >60)
GFR calc non Af Amer: 34 mL/min — ABNORMAL LOW (ref >60)
Glucose, Bld: 134 mg/dL — ABNORMAL HIGH (ref 70–99)
Potassium: 3.8 mmol/L (ref 3.5–5.1)
Sodium: 140 mmol/L (ref 135–145)

## 2019-01-07 LAB — HEPARIN LEVEL (UNFRACTIONATED)
Heparin Unfractionated: 0.27 IU/mL — ABNORMAL LOW (ref 0.30–0.70)
Heparin Unfractionated: 0.53 IU/mL (ref 0.30–0.70)

## 2019-01-07 LAB — URINE CULTURE: Culture: NO GROWTH

## 2019-01-07 LAB — MAGNESIUM: Magnesium: 2.4 mg/dL (ref 1.7–2.4)

## 2019-01-07 LAB — EXPECTORATED SPUTUM ASSESSMENT W GRAM STAIN, RFLX TO RESP C

## 2019-01-07 LAB — GLUCOSE, CAPILLARY
Glucose-Capillary: 127 mg/dL — ABNORMAL HIGH (ref 70–99)
Glucose-Capillary: 115 mg/dL — ABNORMAL HIGH (ref 70–99)
Glucose-Capillary: 124 mg/dL — ABNORMAL HIGH (ref 70–99)
Glucose-Capillary: 151 mg/dL — ABNORMAL HIGH (ref 70–99)
Glucose-Capillary: 130 mg/dL — ABNORMAL HIGH (ref 70–99)

## 2019-01-07 LAB — CBC
HCT: 43.6 % (ref 39.0–52.0)
Hemoglobin: 13.8 g/dL (ref 13.0–17.0)
MCH: 28.3 pg (ref 26.0–34.0)
MCHC: 31.7 g/dL (ref 30.0–36.0)
MCV: 89.5 fL (ref 80.0–100.0)
Platelets: 243 10*3/uL (ref 150–400)
RBC: 4.87 MIL/uL (ref 4.22–5.81)
RDW: 15.6 % — ABNORMAL HIGH (ref 11.5–15.5)
WBC: 13.6 10*3/uL — ABNORMAL HIGH (ref 4.0–10.5)
nRBC: 0 % (ref 0.0–0.2)

## 2019-01-07 LAB — TROPONIN I
Troponin I: 8.13 ng/mL (ref <0.03)
Troponin I: 6.62 ng/mL (ref <0.03)
Troponin I: 7.46 ng/mL (ref <0.03)

## 2019-01-07 MED ORDER — ISOSORB DINITRATE-HYDRALAZINE 20-37.5 MG PO TABS
1.0000 | ORAL_TABLET | Freq: Three times a day (TID) | ORAL | Status: DC
  Administered 2019-01-07 – 2019-01-11 (×11): 1 via ORAL
  Filled 2019-01-07 (×11): qty 1

## 2019-01-07 MED ORDER — FUROSEMIDE 10 MG/ML IJ SOLN
40.0000 mg | Freq: Two times a day (BID) | INTRAMUSCULAR | Status: DC
  Administered 2019-01-07 – 2019-01-08 (×2): 40 mg via INTRAVENOUS
  Filled 2019-01-07 (×2): qty 4

## 2019-01-07 MED ORDER — CHLORHEXIDINE GLUCONATE CLOTH 2 % EX PADS
6.0000 | MEDICATED_PAD | Freq: Every day | CUTANEOUS | Status: AC
  Administered 2019-01-07 – 2019-01-11 (×4): 6 via TOPICAL

## 2019-01-07 MED ORDER — METOPROLOL TARTRATE 5 MG/5ML IV SOLN
5.0000 mg | Freq: Four times a day (QID) | INTRAVENOUS | Status: DC
  Administered 2019-01-07 – 2019-01-08 (×4): 5 mg via INTRAVENOUS
  Filled 2019-01-07 (×4): qty 5

## 2019-01-07 MED ORDER — ORAL CARE MOUTH RINSE
15.0000 mL | Freq: Two times a day (BID) | OROMUCOSAL | Status: DC
  Administered 2019-01-07 – 2019-01-12 (×8): 15 mL via OROMUCOSAL

## 2019-01-07 NOTE — Consult Note (Signed)
Cardiology Consultation:   Patient ID: SHAHAB Powell; [id number]; [birth year]   Admit date: 01/06/2019 Date of Consult: 01/07/2019  Primary Care Provider: Janora Norlander, DO Primary Cardiologist: Dr. Grayland Jack, MD   Patient Profile:   Chris Powell is a 53 y.o. male with a hx of CAD s/p NSTEMI 2016 treated with DES to LAD, HTN, OSA on CPAP, DM, COPD and ongoing tobacco smoking who is being seen today for the evaluation of elevated troponin and respiratory failure at the request of Dr. Lynford Citizen.  History of Present Illness:   Mr. Chris Powell is a 53 yo M with a hx as stated above who was found unresponsive by EMS and as brought initially to [facility] ED for the evaluation of respiratory distress and acute respiratory failure. Per chart review/family report, the patient became acutely short of breath the evening of 01/06/2019 and began exhibiting agonal breathing pattern on EMS arrival. He was intubated in the field with SpO2 of 60% on RA. EKG performed showed chronic lateral T wave abnormalities but no acute ST segment changes although there was wandering baseline and difficult to assess. Troponin on arrival was found to be positive at 1.00 the subsequent values of 1.92>4.66.  Patient was positive for cocaine per UDS on hospital presentation.  Of note, the patient was last seen by our service for preoperative clearance for shoulder surgery. He was admitted to the hospital with NSTEMI 12/21/14 that was treated with drug-eluting stent to the LAD. Echocardiogram showed moderate LVH EF 55-65%. Prior to that, he was last seen it the office by Dr. Acie Fredrickson and was doing well from a cardiac stand point. On last evaluation he continued to smoke tobacco. He denied chest pain, SOB, PND or syncope. He was continued on carvedilol, ASA, and statin. He was cleared for surgery. Unfortunately a week later he called to office telephone triage line with c/o chest pain and was therefore scheduled for a  cardiac catheterization 11/2017 which showed patent LAD stent and and EF of 45-50%. Recommendations were for continued medical treatment.   He is agitated on vent but not following commands. Given dose of lasix with good urine output. Creatinine 1.7 -> 2.1 (creatinine in 1/20 was 1.1)  Echo 01/06/19 EF 40-45% Personally reviewed  Cath 12/16/17  Previously placed Prox LAD-1 stent (unknown type) is widely patent.  Prox LAD-2 lesion is 50% stenosed.  1st Mrg lesion is 30% stenosed.  There is mild left ventricular systolic dysfunction.  The left ventricular ejection fraction is 45-50% by visual estimate.  LV end diastolic pressure is mildly elevated.  Past Medical History:  Diagnosis Date   Anemia    Arthritis    Asthma    Bone spur of ankle    right    COPD (chronic obstructive pulmonary disease) (HCC)    Coronary artery disease    Depression    Diabetes (HCC)    borderline    GERD (gastroesophageal reflux disease)    Hyperlipidemia    Hypertension    Myocardial infarction (HCC)    Pneumonia    HX OF PNA   Right rotator cuff tear 01/06/2018   Shortness of breath dyspnea    Sleep apnea    USES CPAP    Past Surgical History:  Procedure Laterality Date   CARDIAC CATHETERIZATION  12/22/2014   CORONARY ANGIOPLASTY     CORONARY STENT PLACEMENT  12/22/2014   LAD   KNEE SURGERY     LEFT HEART CATH AND CORONARY  ANGIOGRAPHY N/A 12/16/2017   Procedure: LEFT HEART CATH AND CORONARY ANGIOGRAPHY;  Surgeon: Martinique, Peter M, MD;  Location: [place] [facility];  Service: Cardiovascular;  Laterality: N/A;   LEFT HEART CATHETERIZATION WITH CORONARY ANGIOGRAM N/A 12/22/2014   Procedure: LEFT HEART CATHETERIZATION WITH CORONARY ANGIOGRAM;  Surgeon: Jettie Booze, MD;  Location: [facility] CATH [facility];  Service: Cardiovascular;  Laterality: N/A;   SHOULDER ARTHROSCOPY WITH ROTATOR CUFF REPAIR AND SUBACROMIAL DECOMPRESSION Right 01/06/2018   Procedure: RIGHT SHOULDER  ARTHROSCOPY  DEBRIDEMENT,ACROMIOPLASTY, ROTATOR CUFF REPAIR;  Surgeon: Marchia Bond, MD;  Location: [facility];  Service: Orthopedics;  Laterality: Right;     Prior to Admission medications   Medication Sig Start Date End Date Taking? Authorizing Provider  albuterol (PROVENTIL HFA;VENTOLIN HFA) 108 (90 Base) MCG/ACT inhaler Inhale 2 puffs into the lungs every 6 (six) hours as needed for wheezing or shortness of breath. 11/13/18   Dettinger, Fransisca Kaufmann, MD  amLODipine (NORVASC) 10 MG tablet Take 1 tablet (10 mg total) by mouth daily. 10/06/18   Caren Griffins, MD  aspirin EC 81 MG tablet Take 81 mg by mouth daily.    [provider]  atorvastatin (LIPITOR) 80 MG tablet Take 1 tablet (80 mg total) by mouth daily at 6 PM. 10/06/18   Caren Griffins, MD  Cholecalciferol 2000 UNITS CAPS Take 2,000 Units by mouth daily.     [provider]  cloNIDine (CATAPRES) 0.3 MG tablet Take 1 tablet (0.3 mg total) by mouth 2 (two) times daily. 10/06/18   Caren Griffins, MD  colchicine 0.6 MG tablet Take 1-2 tablets (0.6-1.2 mg total) by mouth daily. 11/13/18   Dettinger, Fransisca Kaufmann, MD  fluticasone (FLONASE) 50 MCG/ACT nasal spray Place 2 sprays into both nostrils daily as needed for allergies. 10/06/18   Caren Griffins, MD  furosemide (LASIX) 40 MG tablet Take 1 tablet (40 mg total) by mouth daily. 10/06/18   Caren Griffins, MD  gabapentin (NEURONTIN) 600 MG tablet Take 600 mg by mouth 3 (three) times daily.    [provider]  hydrALAZINE (APRESOLINE) 50 MG tablet Take 1 tablet (50 mg total) by mouth 3 (three) times daily. 10/06/18   Caren Griffins, MD  lisinopril (PRINIVIL,ZESTRIL) 40 MG tablet Take 1 tablet (40 mg total) by mouth daily. 10/06/18   Caren Griffins, MD  nitroGLYCERIN (NITROSTAT) 0.4 MG SL tablet Place 1 tablet (0.4 mg total) under the tongue every 5 (five) minutes as needed for chest pain. 10/06/18   Caren Griffins, MD  omeprazole (PRILOSEC) 20 MG capsule Take 1  capsule (20 mg total) by mouth daily before breakfast. 10/06/18   Gherghe, Vella Redhead, MD  Oxycodone HCl 10 MG TABS Take 1-2 tablets (10-20 mg total) by mouth 4 (four) times daily as needed (for pain). Patient taking differently: Take 10 mg by mouth 4 (four) times daily.  01/06/18   Marchia Bond, MD  potassium chloride (K-DUR) 10 MEQ tablet Take 1 tablet (10 mEq total) by mouth daily. 10/06/18   Caren Griffins, MD  sildenafil (REVATIO) 20 MG tablet TAKE 2-5 TABLETS AS NEEDED PRIOR TO SEXUAL ACTIVITY Patient taking differently: Take 40-100 mg by mouth daily as needed (sexual activity).  12/25/17   Nahser, Wonda Cheng, MD  spironolactone (ALDACTONE) 50 MG tablet Take 1 tablet (50 mg total) by mouth daily. 10/06/18   Caren Griffins, MD    Inpatient Medications: Scheduled Meds:  budesonide (PULMICORT) nebulizer solution  0.25 mg Nebulization BID  chlorhexidine gluconate (MEDLINE KIT)  15 mL Mouth Rinse BID   [START ON 01/08/2019] Chlorhexidine Gluconate Cloth  6 each Topical Q0600   feeding supplement (PRO-STAT SUGAR FREE 64)  60 mL Per Tube QID   furosemide  40 mg Intravenous Daily   insulin aspart  0-15 Units Subcutaneous Q4H   ipratropium-albuterol  3 mL Nebulization Q6H   mouth rinse  15 mL Mouth Rinse 10 times per day   methylPREDNISolone (SOLU-MEDROL) injection  40 mg Intravenous Daily   metoprolol tartrate  5 mg Intravenous Q6H   mupirocin ointment  1 application Nasal BID   pantoprazole (PROTONIX) IV  40 mg Intravenous QHS   Continuous Infusions:  sodium chloride 10 mL/hr at 01/07/19 0900   feeding supplement (JEVITY 1.2 CAL) 20 mL/hr at 01/07/19 0100   heparin 1,750 Units/hr (01/07/19 0900)   propofol (DIPRIVAN) infusion 45 mcg/kg/min (01/07/19 0900)   PRN Meds: sodium chloride, acetaminophen, bisacodyl, docusate, fentaNYL (SUBLIMAZE) injection, hydrALAZINE, labetalol, midazolam, ondansetron (ZOFRAN) IV  Allergies:   No Known Allergies  Social History:   Social  History   Socioeconomic History   Marital status: Legally Separated    Spouse name: Not on file   Number of children: Not on file   Years of education: Not on file   Highest education level: Not on file  Occupational History   Not on file  Social Needs   Financial resource strain: Not on file   Food insecurity:    Worry: Not on file    Inability: Not on file   Transportation needs:    Medical: Not on file    Non-medical: Not on file  Tobacco Use   Smoking status: Current Every Day Smoker    Packs/day: 1.00    Years: 30.00    Pack years: 30.00    Types: Cigarettes   Smokeless tobacco: Never Used   Tobacco comment: " I have  tried vapor cigarettes"  Substance and Sexual Activity   Alcohol use: Yes    Alcohol/week: 0.0 standard drinks    Comment: occ beer    Drug use: Not Currently    Types: Cocaine    Comment: none in months per patient    Sexual activity: Not on file  Lifestyle   Physical activity:    Days per week: Not on file    Minutes per session: Not on file   Stress: Not on file  Relationships   Social connections:    Talks on phone: Not on file    Gets together: Not on file    Attends religious service: Not on file    Active member of club or organization: Not on file    Attends meetings of clubs or organizations: Not on file    Relationship status: Not on file   Intimate partner violence:    Fear of current or ex partner: Not on file    Emotionally abused: Not on file    Physically abused: Not on file    Forced sexual activity: Not on file  Other Topics Concern   Not on file  Social History Narrative   Not on file    Family History:   Family History  Problem Relation Age of Onset   Heart attack Father    Diabetes Father    Cancer Mother        leukemia   Hypertension Mother    Hyperlipidemia Mother    Cancer Sister    Colon cancer Neg Hx  Family Status:  Family Status  Relation Name Status   Father  Deceased     Mother  Deceased   Sister  Deceased   PGF  Deceased   PGM  Deceased   MGF  Deceased   MGM  Deceased   Neg Hx  (Not Specified)    ROS:  Please see the history of present illness.  All other ROS reviewed and negative.     Physical Exam/Data:   Vitals:   01/07/19 0823 01/07/19 0830 01/07/19 0900 01/07/19 0930  BP:  (!) 161/141 (!) 175/110 (!) 153/102  Pulse:  85 90 89  Resp:  (!) 28 (!) 25 (!) 24  Temp:  98.1 F (36.7 C) 98.4 F (36.9 C) 98.2 F (36.8 C)  TempSrc:      SpO2: 97% 100% 95% 93%  Weight:      Height:        Intake/Output Summary (Last 24 hours) at 01/07/2019 1013 Last data filed at 01/07/2019 0900 Gross per 24 hour  Intake 2486.67 ml  Output 1880 ml  Net 606.67 ml   Filed Weights   01/06/19 0311 01/06/19 0600 01/07/19 0400  Weight: (!) 155 kg (!) 157.9 kg (!) 156.5 kg   Body mass index is 52.46 kg/m.   Exam per MD below     EKG:  The EKG was personally reviewed and demonstrates: NST with LVH and lateral TWI. (chronic) 01/06/2019 Telemetry:  Telemetry was personally reviewed and demonstrates:  NSR   Relevant CV Studies:  Cardiac catheterization 12/16/2017:  Previously placed Prox LAD-1 stent (unknown type) is widely patent.  Prox LAD-2 lesion is 50% stenosed.  1st Mrg lesion is 30% stenosed.  There is mild left ventricular systolic dysfunction.  The left ventricular ejection fraction is 45-50% by visual estimate.  LV end diastolic pressure is mildly elevated.   1. Nonobstructive CAD. Prior stent in the proximal LAD is widely patent.  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 Mild global LV dysfunction. EF 45-50%. 4. Mildly elevated LVEDP.  Plan: continue medical therapy.   Echocardiogram 01/06/2019: 1. The left ventricle has mild-moderately reduced systolic function, with an ejection fraction of 40-45%. The cavity size was normal. There is mildly increased left ventricular wall thickness. Left ventricular  diastolic parameters were normal. Left  ventricular diffuse hypokinesis.  2. The right ventricle has normal systolic function. The cavity was normal. There is no increase in right ventricular wall thickness.  3. Left atrial size was mildly dilated.  4. The aortic valve is tricuspid.  5. The inferior vena cava was dilated in size with >50% respiratory variability.   2-D echo 12/23/14: - Left ventricle: The cavity size was normal. Wall thickness was increased in a pattern of moderate LVH. Systolic function was normal. The estimated ejection fraction was in the range of 55% to 65%. - Left atrium: The atrium was moderately dilated.  Cardiac catheterization 12/22/14:  1. Widely patentleft main coronary artery. 2. Severe disease in the midleft anterior descending artery with moderate disease further distal. The 95% stenosis was treated with a 2.5 x 24 Synergy drug-eluting stent, postdilated with a 3.0 noncompliant balloon. 3. Mild to moderate disease in theleft circumflex artery and its branches. 4. 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 5. Left ventricular systolic function not assessed . Elevated LVEDP 26 mmHg.  Overall difficult cardiac cath from the right radial  approach due to difficulty in locating the left main and RCA. He has a short ascending aorta.  Laboratory Data:  Chemistry Recent Labs  Lab 01/06/19 [id number]  01/06/19 0445 01/06/19 1237 01/07/19 0318  NA 139   < > 142 141 140  K 3.9   < > 3.8 3.0* 3.8  CL 106  --   --   --  105  CO2 20*  --   --   --  24  GLUCOSE 206*  --   --   --  134*  BUN 14  --   --   --  21*  CREATININE 1.71*  --   --   --  2.15*  CALCIUM 8.2*  --   --   --  8.8*  GFRNONAA 45*  --   --   --  34*  GFRAA 52*  --   --   --  40*  ANIONGAP 13  --   --   --  11   < > = values in this interval not displayed.    Total Protein  Date Value Ref  Range Status  01/06/2019 6.4 (L) 6.5 - 8.1 g/dL Final  04/07/2018 6.9 6.0 - 8.5 g/dL Final   Albumin  Date Value Ref Range Status  01/06/2019 3.1 (L) 3.5 - 5.0 g/dL Final  04/07/2018 3.9 3.5 - 5.5 g/dL Final   AST  Date Value Ref Range Status  01/06/2019 65 (H) 15 - 41 U/L Final   ALT  Date Value Ref Range Status  01/06/2019 37 0 - 44 U/L Final   Alkaline Phosphatase  Date Value Ref Range Status  01/06/2019 83 38 - 126 U/L Final   Total Bilirubin  Date Value Ref Range Status  01/06/2019 0.9 0.3 - 1.2 mg/dL Final   Bilirubin Total  Date Value Ref Range Status  04/07/2018 0.3 0.0 - 1.2 mg/dL Final   Hematology Recent Labs  Lab 01/06/19 0824 01/06/19 1237 01/07/19 0318  WBC 11.1*  --  13.6*  RBC 5.02  --  4.87  HGB 14.1 15.3 13.8  HCT 44.8 45.0 43.6  MCV 89.2  --  89.5  MCH 28.1  --  28.3  MCHC 31.5  --  31.7  RDW 15.3  --  15.6*  PLT 299  --  243   Cardiac Enzymes Recent Labs  Lab 01/06/19 0710 01/06/19 1251 01/06/19 1827  TROPONINI 1.00* 1.92* 4.66*    Recent Labs  Lab 01/06/19 0347  TROPIPOC 0.19*    BNP Recent Labs  Lab 01/06/19 0442  BNP 431.5*    DDimer No results for input(s): DDIMER in the last 168 hours. TSH:  Lab Results  Component Value Date   TSH 0.904 09/11/2017   Lipids: Lab Results  Component Value Date   CHOL 117 12/09/2017   HDL 35 (L) 12/09/2017   LDLCALC 67 12/09/2017   TRIG 74 12/09/2017   CHOLHDL 3.3 12/09/2017   HgbA1c: Lab Results  Component Value Date   HGBA1C 5.6 04/07/2018    Radiology/Studies:  Dg Chest Portable 1 View  Result Date: 01/06/2019 CLINICAL DATA:  Status post intubation EXAM: PORTABLE CHEST 1 VIEW COMPARISON:  10/05/2018 FINDINGS: Endotracheal tube and gastric catheter are noted in satisfactory position. Cardiac shadow is mildly enlarged. Increased central vascular congestion is noted with diffuse pulmonary edema. No sizable effusion is noted. No bony abnormality is seen. IMPRESSION: Changes  consistent with CHF and pulmonary edema. Electronically Signed   By: Inez Catalina  M.D.   On: 01/06/2019 03:41   Assessment and Plan:   1.Elevated troponin: -Pt has known CAD with prior LAD stenting in 2016. Last cath performed 11/2017 secondary to episode of chest pain. Cath showed patent LAD stent and an LVEF of 45-50%  -Brought to ED after being found unresponsive by family and was agonal breathing in the field>>intubated per EMS with a SpO2 in the 60's -Troponin found to be elevated at 1.00>1.92>4.66 -EKG with chronic alteral T wave abnormalities but no acute ST segment changes  -CXR with cardiomegaly and diffuse pulmonary edema -Patient started on heparin gtt per pharmacy -Currently on IV Lasix 40 mg bid -Home meds include ASA 81 mg, amlodipine 10, hydralazine 53 times daily, lisinopril 40 and spironolactone 50 mg daily -Per cath report, patient had a proximal LAD second lesion that was approximately 50% stenosed as well as a 30% stenosis of the first marginal and no other occlusive disease -Echocardiogram performed 01/06/2019 with LVEF of 40 to 45% with diffuse ventricular hypokinesis.  LV gram performed 11/2017 during last cardiac catheterization with EF estimated at 45 to 50%.  Last echocardiogram in 2016 with EF of 55 to 65%.  -Can consider further ischemic evaluation with cardiac catheterization given significant troponin trend however given current acute illness with respiratory failure and AKI, would hold off on this for now and monitor closely   2. Acute respiratory failure: -Pt found unresponsive by EMS and was brought initially to [facility] ED for the evaluation of respiratory distress and acute respiratory failure. Per chart review/family report, the patient became acutely short of breath the evening of 01/06/2019 and began exhibiting agonal breathing pattern on EMS arrival. He was intubated in the field with SpO2 of 60% on RA. -COVID negative  -Managed per PCCM -Continues with  ventilator support  3. HTN: -Stable, 138/89, 147/97, 145/92 -Home regimen includes Lasix 40 mg daily, Norvasc 10 mg daily, clonidine 0.3 mg twice daily, hydralazine 50 mg 3 times daily and lisinopril 40 mg daily  4. Acute systolic heart failure: -Echocardiogram 01/06/2019 with LVEF of 40-45% with diffuse hypokinesis -LV gram on cath last year with similar LV function  -CXR with acute pulmonary edema -Treated with IV Lasix 65m daily  -BNP 431 01/06/2019  5. AKI: -Creatinine, 2.15 today with a baseline of 1.0-1.3 -Avoid nephrotoxic medications    For questions or updates, please contact CThroopPlease consult [email or web address] for contact info under Cardiology/STEMI.   SLyndel SafeNP-C [facility] Pager: [phone]/16/2020 10:13 AM   Patient seen and examined with the above-signed Advanced Practice Provider and/or Housestaff. I personally reviewed laboratory data, imaging studies and relevant notes. I independently examined the patient and formulated the important aspects of the plan. I have edited the note to reflect any of my changes or salient points. I have personally discussed the plan with the patient and/or family.  53y/o male with obesity, HTN, CAD, OSA and systolic HF with EF ~~23% Admitted with acute respiratory and AKI due to pulmonary edema in setting of cocaine use.   Remains on vent. Troponin elevated to 4.6 but ECG non-acute. Echo with EF 40-45%. (relatively stable). Creatinine up to 2.1  Appears volume overloaded on exam   General:  Obese male agitated on vent  HEENT: normal + ETT Neck: supple. Thick. Hard to see JVP but appears elevated Carotids 2+ bilat; no bruits. No lymphadenopathy or thryomegaly appreciated. Cor: PMI nondisplaced. Regular rate & rhythm. No rubs, gallops or murmurs. Lungs: coarse  Abdomen:  obese soft, nontender, + distended. No hepatosplenomegaly. No bruits or masses. Good bowel sounds. Extremities: no cyanosis, clubbing,  rash, 1+ edema Neuro: intubated. Agitated   Suspect respiratory failure and troponin related to acute HF in setting of cocaine use and HTN crisis. Would continue aggressive diuresis as renal function permits. Start Bidil for BP control. Eventually can add Entresto. Hold clonidine and lisinopril for now. Can use NTG drip as needed.   Given recent cath results, AKI and clinical picture, I do not think he needs repeat cath currently. D/w CCM at bedside.   CRITICAL CARE Performed by: Glori Bickers  Total critical care time: 45 minutes  Critical care time was exclusive of separately billable procedures and treating other patients.  Critical care was necessary to treat or prevent imminent or life-threatening deterioration.  Critical care was time spent personally by me (independent of midlevel providers or residents) on the following activities: development of treatment plan with patient and/or surrogate as well as nursing, discussions with consultants, evaluation of patient's response to treatment, examination of patient, obtaining history from patient or surrogate, ordering and performing treatments and interventions, ordering and review of laboratory studies, ordering and review of radiographic studies, pulse oximetry and re-evaluation of patient's condition.  Glori Bickers, MD  12:38 PM

## 2019-01-07 NOTE — Progress Notes (Signed)
This RN heard vent alarms and upon entering room, patient had self extubated, despite having bilateral wrists restrained. Placed on 5L O2 n/c. Propofol turned off. RT and CCM notified. Patient is alert, oriented, denies CP or SOB, no s/s distress noted. Restraints removed. Dr. Isaiah Serge rounded and saw patient. Dr. Gala Romney rounding and also spoke to patient. Will continue to monitor.  Leanna Battles, RN

## 2019-01-07 NOTE — Progress Notes (Signed)
ANTICOAGULATION CONSULT NOTE - Follow Up Consult  Pharmacy Consult for heparin Indication: chest pain/ACS  Labs: Recent Labs    01/06/19 0337  01/06/19 0710 01/06/19 0824 01/06/19 1135 01/06/19 1237 01/06/19 1251 01/06/19 1827 01/06/19 1954 01/07/19 0318  HGB  --    < >  --  14.1  --  15.3  --   --   --  13.8  HCT  --    < >  --  44.8  --  45.0  --   --   --  43.6  PLT  --   --   --  299  --   --   --   --   --  243  HEPARINUNFRC  --   --   --   --  0.33  --   --   --  0.14* 0.27*  CREATININE 1.71*  --   --   --   --   --   --   --   --  2.15*  TROPONINI  --   --  1.00*  --   --   --  1.92* 4.66*  --   --    < > = values in this interval not displayed.    Assessment: 53yo male remains slightly subtherapeutic on heparin after rate change; no gtt issues or signs of bleeding per RN.  Goal of Therapy:  Heparin level 0.3-0.7 units/ml   Plan:  Will increase heparin gtt by 1 units/kg/hr to 1750 units/hr and check level in 6 hours.    Vernard Gambles, PharmD, BCPS  01/07/2019,4:39 AM

## 2019-01-07 NOTE — Progress Notes (Signed)
ANTICOAGULATION CONSULT NOTE  Pharmacy Consult for heparin Indication: chest pain/ACS  No Known Allergies  Patient Measurements: Height: _0  (172.7 cm) Weight: (!) 345 lb 0.3 oz (156.5 kg) IBW/kg (Calculated) : 68.4 Heparin Dosing Weight: 106.4 kg  Vital Signs: Temp: 98.4 F (36.9 C) (04/16 1200) Temp Source: Oral (04/16 0400) BP: 133/84 (04/16 1200) Pulse Rate: 74 (04/16 1200)  Labs: Recent Labs    01/06/19 0337  01/06/19 0710 01/06/19 0824  01/06/19 1237 01/06/19 1251 01/06/19 1827 01/06/19 1954 01/07/19 0318 01/07/19 1041  HGB  --    < >  --  14.1  --  15.3  --   --   --  13.8  --   HCT  --    < >  --  44.8  --  45.0  --   --   --  43.6  --   PLT  --   --   --  299  --   --   --   --   --  243  --   HEPARINUNFRC  --   --   --   --    < >  --   --   --  0.14* 0.27* 0.53  CREATININE 1.71*  --   --   --   --   --   --   --   --  2.15*  --   TROPONINI  --   --  1.00*  --   --   --  1.92* 4.66*  --   --   --    < > = values in this interval not displayed.    Estimated Creatinine Clearance: 58.9 mL/min (A) (by C-G formula based on SCr of 2.15 mg/dL (H)).   Medical History: Past Medical History:  Diagnosis Date  . Anemia   . Arthritis   . Asthma   . Bone spur of ankle    right   . COPD (chronic obstructive pulmonary disease) ([place])   . Coronary artery disease   . Depression   . Diabetes ([place])    borderline   . GERD (gastroesophageal reflux disease)   . Hyperlipidemia   . Hypertension   . Myocardial infarction ([place])   . Pneumonia    HX OF PNA  . Right rotator cuff tear 01/06/2018  . Shortness of breath dyspnea   . Sleep apnea    USES CPAP    Medications:  Scheduled:  . budesonide (PULMICORT) nebulizer solution  0.25 mg Nebulization BID  . chlorhexidine gluconate (MEDLINE KIT)  15 mL Mouth Rinse BID  . [START ON 01/08/2019] Chlorhexidine Gluconate Cloth  6 each Topical Q0600  . feeding supplement (PRO-STAT SUGAR FREE 64)  60 mL Per Tube QID  .  furosemide  40 mg Intravenous BID  . insulin aspart  0-15 Units Subcutaneous Q4H  . ipratropium-albuterol  3 mL Nebulization Q6H  . mouth rinse  15 mL Mouth Rinse 10 times per day  . methylPREDNISolone (SOLU-MEDROL) injection  40 mg Intravenous Daily  . metoprolol tartrate  5 mg Intravenous Q6H  . mupirocin ointment  1 application Nasal BID  . pantoprazole (PROTONIX) IV  40 mg Intravenous QHS   Infusions:  . sodium chloride 10 mL/hr at 01/07/19 1200  . feeding supplement (JEVITY 1.2 CAL) 20 mL/hr at 01/07/19 0100  . heparin 1,750 Units/hr (01/07/19 1212)  . propofol (DIPRIVAN) infusion 40 mcg/kg/min (01/07/19 1200)    Assessment: 53 yo M with a history of  MI in March 2016 (proximal LAD stent placed). Pharmacy consulted to start IV heparin for ACS/STEMI. Patient not on anticoagulation PTA.   Troponin trending up to 4.66, will continue heparin. Heparin level is therapeutic at 0.53, on 1750 units/hr. Hgb 13.8, plt 243. No s/sx of bleeding. No infusion issues  Goal of Therapy:  Heparin level 0.3-0.7 units/ml Monitor platelets by anticoagulation protocol: Yes   Plan:  Continue heparin IV at 1750 units/hr Daily heparin level and CBC  Monitor for signs/symptoms of bleeding  Antonietta Jewel, PharmD, [place] Clinical Pharmacist  Pager: [phone] Phone: [phone] 01/07/2019 12:24 PM

## 2019-01-07 NOTE — Progress Notes (Signed)
NAME:  Chris Powell, MRN:  [id number], DOB:  12/15/1965, LOS: 1 ADMISSION DATE:  01/06/2019, CONSULTATION DATE:  4/15 REFERRING MD:  Ward - EM , CHIEF COMPLAINT:  Respiratory failure   Brief History   53 yo M admitted 4/15 with acute onset SOB, on EMS arrival he was agonal / unresponsive.  Intubated.  UDS + cocaine.  Abnormal ECG without evidence of STEMI.  Rx with heparin gtt.  CXR consistent with pulmonary edema.  COVID negative.  PCCM asked to admit.   Past Medical History  CHF, CAD, HTN, obesity, COPD, HLD, OSA on home CPAP, depression, GERD, anemia   Significant Hospital Events   4/15 Intubated, admitted   Consults:    Procedures:  ETT 4/15 >>   Significant Diagnostic Tests:  CXR 4/15 > cardiomegaly, diffuse pulm edema pattern with vascular congestion   Micro Data:  BCx 4/15 >  RVP 4/15 > Tracheal aspirate 4/15 > COVID-19 4/15 > negative  Antimicrobials:  Azithromycin 4/15  Rocephin 4/15  Interim history/subjective:  Continues on vent Weaning on PSV 8/8 today. Agitated when sedation is weaned.  Objective   Blood pressure (!) 145/92, pulse 84, temperature 98.1 F (36.7 C), resp. rate 15, height 5\' 8"  (1.727 m), weight (!) 156.5 kg, SpO2 94 %.    Vent Mode: PRVC FiO2 (%):  [40 %-70 %] 40 % Set Rate:  [18 bmp-26 bmp] 18 bmp Vt Set:  [640 mL] 640 mL PEEP:  [8 cmH20] 8 cmH20 Plateau Pressure:  [22 cmH20-25 cmH20] 24 cmH20   Intake/Output Summary (Last 24 hours) at 01/07/2019 1033 Last data filed at 01/07/2019 1021 Gross per 24 hour  Intake 2595.95 ml  Output 2205 ml  Net 390.95 ml   Filed Weights   01/06/19 0311 01/06/19 0600 01/07/19 0400  Weight: (!) 155 kg (!) 157.9 kg (!) 156.5 kg    Examination: Gen:     Obese HEENT:  EOMI, sclera anicteric Neck:     No masses; no thyromegaly, ETT Lungs:    Clear to auscultation bilaterally; normal respiratory effort CV:         Regular rate and rhythm; no murmurs Abd:      + bowel sounds; soft, non-tender; no  palpable masses, no distension Ext:    No edema; adequate peripheral perfusion Skin:      Warm and dry; no rash Neuro: Sedated, unresponsive  Resolved Hospital Problem list     Assessment & Plan:   Acute Hypoxemic / Hypercapnic Respiratory Failure   -COVID negative  -in setting of pulmonary edema, cocaine use COPD OSA  -on CPAP  P: Continue vent support Wean down PEEP/Fio2 Continue nebs Daily PSV weans Solumerol at 40 mg. Can taper over the next few days Will need QHS CPAP post extubation   Acute Pulmonary Edema / Acute Decompensated Systolic CHF  -LVEF 2019 45-50% -UDS positive for cocaine  HTN Elevated troponin / Demand Ischemia  P: Continue diuresis Hold home norvasc, clonidine, hydralazine, lisinopril, po lasix Trend troponin  On heparin drip Cardiology consulted by primary team Drug counseling when extubated  Hyperglycemia P: SSI   Acute Kidney Injury -baseline Cr 1-1.1 P: Trend BMP / urinary output Replace electrolytes as indicated Avoid nephrotoxic agents, ensure adequate renal perfusion  At Risk Malnutrition  P: Tube feeds  Best practice:  Diet: NPO Pain/Anxiety/Delirium protocol (if indicated): propofol, PRN fent/versed  VAP protocol (if indicated): yes DVT prophylaxis: Heparin gtt per pharmacy  GI prophylaxis: protonix  Glucose control: SSI  Mobility: bedrest Code Status: full Family Communication: none  Disposition: admit to ICU   Labs   CBC: Recent Labs  Lab 01/06/19 0346 01/06/19 0445 01/06/19 0824 01/06/19 1237 01/07/19 0318  WBC  --   --  11.1*  --  13.6*  NEUTROABS  --   --  10.1*  --   --   HGB 16.0 14.6 14.1 15.3 13.8  HCT 47.0 43.0 44.8 45.0 43.6  MCV  --   --  89.2  --  89.5  PLT  --   --  299  --  243    Basic Metabolic Panel: Recent Labs  Lab 01/06/19 0337 01/06/19 0346 01/06/19 0442 01/06/19 0445 01/06/19 1237 01/07/19 0318  NA 139 142  --  142 141 140  K 3.9 3.6  --  3.8 3.0* 3.8  CL 106  --   --   --    --  105  CO2 20*  --   --   --   --  24  GLUCOSE 206*  --   --   --   --  134*  BUN 14  --   --   --   --  21*  CREATININE 1.71*  --   --   --   --  2.15*  CALCIUM 8.2*  --   --   --   --  8.8*  MG  --   --  2.6*  --   --   --   PHOS  --   --  6.3*  --   --   --    GFR: Estimated Creatinine Clearance: 58.9 mL/min (A) (by C-G formula based on SCr of 2.15 mg/dL (H)). Recent Labs  Lab 01/06/19 0442 01/06/19 0824 01/07/19 0318  PROCALCITON <0.10  --   --   WBC  --  11.1* 13.6*    Liver Function Tests: Recent Labs  Lab 01/06/19 0337  AST 65*  ALT 37  ALKPHOS 83  BILITOT 0.9  PROT 6.4*  ALBUMIN 3.1*   No results for input(s): LIPASE, AMYLASE in the last 168 hours. No results for input(s): AMMONIA in the last 168 hours.  ABG    Component Value Date/Time   PHART 7.471 (H) 01/06/2019 1237   PCO2ART 30.7 (L) 01/06/2019 1237   PO2ART 61.0 (L) 01/06/2019 1237   HCO3 22.5 01/06/2019 1237   TCO2 23 01/06/2019 1237   ACIDBASEDEF 2.0 01/06/2019 0445   O2SAT 93.0 01/06/2019 1237     Coagulation Profile: No results for input(s): INR, PROTIME in the last 168 hours.  Cardiac Enzymes: Recent Labs  Lab 01/06/19 0710 01/06/19 1251 01/06/19 1827  TROPONINI 1.00* 1.92* 4.66*    HbA1C: HB A1C (BAYER DCA - WAIVED)  Date/Time Value Ref Range Status  04/07/2018 10:52 AM 5.6 <7.0 % Final    Comment:                                          Diabetic Adult            <7.0                                       Healthy Adult        4.3 - 5.7                                                           (  DCCT/NGSP) American Diabetes Association's Summary of Glycemic Recommendations for Adults with Diabetes: Hemoglobin A1c <7.0%. More stringent glycemic goals (A1c <6.0%) may further reduce complications at the cost of increased risk of hypoglycemia.   10/31/2017 03:27 PM 5.3 <7.0 % Final    Comment:                                          Diabetic Adult            <7.0                                        Healthy Adult        4.3 - 5.7                                                           (DCCT/NGSP) American Diabetes Association's Summary of Glycemic Recommendations for Adults with Diabetes: Hemoglobin A1c <7.0%. More stringent glycemic goals (A1c <6.0%) may further reduce complications at the cost of increased risk of hypoglycemia.    Hgb A1c MFr Bld  Date/Time Value Ref Range Status  11/20/2017 01:45 PM 6.0 (H) 4.8 - 5.6 % Final    Comment:             Prediabetes: 5.7 - 6.4          Diabetes: >6.4          Glycemic control for adults with diabetes: <7.0     CBG: Recent Labs  Lab 01/06/19 1619 01/06/19 2026 01/06/19 2342 01/07/19 0411 01/07/19 0840  GLUCAP 121* 124* 117* 127* 115*    The patient is critically ill with multiple organ system failure and requires high complexity decision making for assessment and support, frequent evaluation and titration of therapies, advanced monitoring, review of radiographic studies and interpretation of complex data.   Critical Care Time devoted to patient care services, exclusive of separately billable procedures, described in this note is 35 minutes.   Chilton Greathouse MD [facility] Pager [phone] If no answer call [phone] 01/07/2019, 10:33 AM

## 2019-01-07 NOTE — Progress Notes (Signed)
PROGRESS NOTE    Chris Powell  PYK:[id number] DOB: 1965-12-26 DOA: 01/06/2019 PCP: Janora Norlander, DO   Brief Narrative:  Per HPI: 53 year old M found unresponsive by EMS and presented to ED for respiratory distress. Per family, patient became acutely short of breath this evening, and exhibiting agonal respiratory patterns upon EMS arrival. [facility] airway placed by EMS. Upon arrival to ED, patient with abnormal ECG without evidence of STEMI, positive troponin. Septic workup initiated however labs were hemolyzed.  Received 125 solumedrol in ED as well as 40 Lasix.   Patient was intubated and ruled out for COVID-19.  Respiratory viral panel otherwise has remained negative and he was started on Rocephin and azithromycin.  He was also noted to have diffuse pulmonary edema and vascular congestion for which Lasix was initiated.  Troponins have trended up overnight and patient is noted to be positive for cocaine.  He has been started on heparin drip and 2D echocardiogram demonstrates diminished LVEF of 40 to 45% compared to 55 to 60% in 2016.  He continues to have some elevated blood pressure readings this morning.  Assessment & Plan:   Principal Problem:   Acute hypercapnic respiratory failure (HCC) Active Problems:   Morbid obesity-BMI 63   COPD with exacerbation ([place])   Type 2 diabetes mellitus with renal complication (HCC)   Acute pulmonary edema (HCC)   AKI (acute kidney injury) ([place])   Elevated troponin  1. Acute hypoxemic and hypercapnic respiratory failure in the setting of pulmonary edema and COPD.  Patient is noted to have significant pulmonary edema with decompensated systolic congestive heart failure with LVEF of 40 to 45% noted on echocardiogram on 4/15.  Diffuse hypokinesis suggestive of ACS.  Patient remains on Lasix as well as nebulized treatments and Solu-Medrol with plans to taper per PCCM. 2. Troponin elevation with possible ACS/NSTEMI.  This appears to be in the setting  of cocaine use.  [place] cardiology consultation for further evaluation of ACS.  Continue on heparin drip for now.  Metoprolol IV added every 6 hours for better rate control and assistance in blood pressure. 3. Acute systolic CHF decompensation secondary to above.  Continue to monitor I's and O's and daily weights.  Patient remains on Lasix daily at this time, which I will increase to twice daily dosing.  With slight negative fluid balance so far and will follow with labs in a.m. 4. Hyperglycemia.  Monitor closely maintain on SSI. 5. Cocaine abuse.  Will need drug counseling when extubated. 6. AKI.  Baseline creatinine 1-1.1.  Monitor closely while on Lasix and repeat labs in a.m.  Monitor I's and O's and avoid other nephrotoxic agents.   DVT prophylaxis: Heparin drip Code Status: Full code Family Communication: We will contact family Disposition Plan: Continue management of fluid overload as well as COPD in the setting of possible ACS.  Appreciate cardiology evaluation today.   Consultants:   PCCM  Cardiology  Procedures:   None  Antimicrobials:   Azithromycin 4/15  Rocephin 4/15   Subjective: Patient seen and evaluated today and sedated on ventilator.  He is minimally arousable.  Troponins noted to be elevated overnight and patient started on heparin drip.  2D echocardiogram was diminished EF noted.  Patient has not had very much output on Lasix noted thus far.  Objective: Vitals:   01/07/19 0930 01/07/19 1000 01/07/19 1030 01/07/19 1100  BP: (!) 153/102 (!) 145/92 (!) 147/97 138/89  Pulse: 89 84 85 78  Resp: (!) 24  15 (!) 23 (!) 23  Temp: 98.2 F (36.8 C) 98.1 F (36.7 C) 98.1 F (36.7 C) 98.2 F (36.8 C)  TempSrc:      SpO2: 93% 94% 95% 96%  Weight:      Height:        Intake/Output Summary (Last 24 hours) at 01/07/2019 1129 Last data filed at 01/07/2019 1115 Gross per 24 hour  Intake 2633.21 ml  Output 2090 ml  Net 543.21 ml   Filed Weights   01/06/19  0311 01/06/19 0600 01/07/19 0400  Weight: (!) 155 kg (!) 157.9 kg (!) 156.5 kg    Examination:  General exam: Sedated on ventilator with FiO2 40%. Respiratory system: Diminished to auscultation at bases. Cardiovascular system: S1 & S2 heard, RRR. No JVD, murmurs, rubs, gallops or clicks. No pedal edema. Gastrointestinal system: Abdomen is nondistended, soft and nontender. No organomegaly or masses felt. Normal bowel sounds heard. Central nervous system: Somnolent. Extremities: Scant bilateral edema noted. Skin: No rashes, lesions or ulcers Psychiatry: Cannot be assessed.    Data Reviewed: I have personally reviewed following labs and imaging studies  CBC: Recent Labs  Lab 01/06/19 0346 01/06/19 0445 01/06/19 0824 01/06/19 1237 01/07/19 0318  WBC  --   --  11.1*  --  13.6*  NEUTROABS  --   --  10.1*  --   --   HGB 16.0 14.6 14.1 15.3 13.8  HCT 47.0 43.0 44.8 45.0 43.6  MCV  --   --  89.2  --  89.5  PLT  --   --  299  --  161   Basic Metabolic Panel: Recent Labs  Lab 01/06/19 0337 01/06/19 0346 01/06/19 0442 01/06/19 0445 01/06/19 1237 01/07/19 0318  NA 139 142  --  142 141 140  K 3.9 3.6  --  3.8 3.0* 3.8  CL 106  --   --   --   --  105  CO2 20*  --   --   --   --  24  GLUCOSE 206*  --   --   --   --  134*  BUN 14  --   --   --   --  21*  CREATININE 1.71*  --   --   --   --  2.15*  CALCIUM 8.2*  --   --   --   --  8.8*  MG  --   --  2.6*  --   --   --   PHOS  --   --  6.3*  --   --   --    GFR: Estimated Creatinine Clearance: 58.9 mL/min (A) (by C-G formula based on SCr of 2.15 mg/dL (H)). Liver Function Tests: Recent Labs  Lab 01/06/19 0337  AST 65*  ALT 37  ALKPHOS 83  BILITOT 0.9  PROT 6.4*  ALBUMIN 3.1*   No results for input(s): LIPASE, AMYLASE in the last 168 hours. No results for input(s): AMMONIA in the last 168 hours. Coagulation Profile: No results for input(s): INR, PROTIME in the last 168 hours. Cardiac Enzymes: Recent Labs  Lab  01/06/19 0710 01/06/19 1251 01/06/19 1827  TROPONINI 1.00* 1.92* 4.66*   BNP (last 3 results) No results for input(s): PROBNP in the last 8760 hours. HbA1C: No results for input(s): HGBA1C in the last 72 hours. CBG: Recent Labs  Lab 01/06/19 1619 01/06/19 2026 01/06/19 2342 01/07/19 0411 01/07/19 0840  GLUCAP 121* 124* 117* 127* 115*   Lipid  Profile: No results for input(s): CHOL, HDL, LDLCALC, TRIG, CHOLHDL, LDLDIRECT in the last 72 hours. Thyroid Function Tests: No results for input(s): TSH, T4TOTAL, FREET4, T3FREE, THYROIDAB in the last 72 hours. Anemia Panel: No results for input(s): VITAMINB12, FOLATE, FERRITIN, TIBC, IRON, RETICCTPCT in the last 72 hours. Sepsis Labs: Recent Labs  Lab 01/06/19 0442  PROCALCITON <0.10    Recent Results (from the past 240 hour(s))  SARS Coronavirus 2 [facility] order, Performed in [facility] hospital lab)     Status: None   Collection Time: 01/06/19  3:44 AM  Result Value Ref Range Status   SARS Coronavirus 2 NEGATIVE NEGATIVE Final    Comment: (NOTE) If result is NEGATIVE SARS-CoV-2 target nucleic acids are NOT DETECTED. The SARS-CoV-2 RNA is generally detectable in upper and lower  respiratory specimens during the acute phase of infection. The lowest  concentration of SARS-CoV-2 viral copies this assay can detect is 250  copies / mL. A negative result does not preclude SARS-CoV-2 infection  and should not be used as the sole basis for treatment or other  patient management decisions.  A negative result may occur with  improper specimen collection / handling, submission of specimen other  than nasopharyngeal swab, presence of viral mutation(s) within the  areas targeted by this assay, and inadequate number of viral copies  (<250 copies / mL). A negative result must be combined with clinical  observations, patient history, and epidemiological information. If result is POSITIVE SARS-CoV-2 target nucleic acids are DETECTED. The  SARS-CoV-2 RNA is generally detectable in upper and lower  respiratory specimens dur ing the acute phase of infection.  Positive  results are indicative of active infection with SARS-CoV-2.  Clinical  correlation with patient history and other diagnostic information is  necessary to determine patient infection status.  Positive results do  not rule out bacterial infection or co-infection with other viruses. If result is PRESUMPTIVE POSTIVE SARS-CoV-2 nucleic acids MAY BE PRESENT.   A presumptive positive result was obtained on the submitted specimen  and confirmed on repeat testing.  While 2019 novel coronavirus  (SARS-CoV-2) nucleic acids may be present in the submitted sample  additional confirmatory testing may be necessary for epidemiological  and / or clinical management purposes  to differentiate between  SARS-CoV-2 and other Sarbecovirus currently known to infect humans.  If clinically indicated additional testing with an alternate test  methodology [phone]) is advised. The SARS-CoV-2 RNA is generally  detectable in upper and lower respiratory sp ecimens during the acute  phase of infection. The expected result is Negative. Fact Sheet for Patients:  StrictlyIdeas.no Fact Sheet for Healthcare Providers: BankingDealers.co.za This test is not yet approved or cleared by the [place] FDA and has been authorized for detection and/or diagnosis of SARS-CoV-2 by FDA under an Emergency Use Authorization (EUA).  This EUA will remain in effect (meaning this test can be used) for the duration of the COVID-19 declaration under Section 564(b)(1) of the Act, 21 U.S.C. section 360bbb-3(b)(1), unless the authorization is terminated or revoked sooner. Performed at [facility], [place] [address], [place], [place] [address]   Urine culture     Status: None   Collection Time: 01/06/19  4:05 AM  Result Value Ref Range Status   Specimen  Description URINE, RANDOM  Final   Special Requests NONE  Final   Culture   Final    NO GROWTH Performed at [facility], [place] [address], [place], [place] [address]  Report Status 01/07/2019 FINAL  Final  Respiratory Panel by PCR     Status: None   Collection Time: 01/06/19  5:57 AM  Result Value Ref Range Status   Adenovirus NOT DETECTED NOT DETECTED Final   Coronavirus 229E NOT DETECTED NOT DETECTED Final    Comment: (NOTE) The Coronavirus on the Respiratory Panel, DOES NOT test for the novel  Coronavirus (2019 nCoV)    Coronavirus HKU1 NOT DETECTED NOT DETECTED Final   Coronavirus NL63 NOT DETECTED NOT DETECTED Final   Coronavirus OC43 NOT DETECTED NOT DETECTED Final   Metapneumovirus NOT DETECTED NOT DETECTED Final   Rhinovirus / Enterovirus NOT DETECTED NOT DETECTED Final   Influenza A NOT DETECTED NOT DETECTED Final   Influenza B NOT DETECTED NOT DETECTED Final   Parainfluenza Virus 1 NOT DETECTED NOT DETECTED Final   Parainfluenza Virus 2 NOT DETECTED NOT DETECTED Final   Parainfluenza Virus 3 NOT DETECTED NOT DETECTED Final   Parainfluenza Virus 4 NOT DETECTED NOT DETECTED Final   Respiratory Syncytial Virus NOT DETECTED NOT DETECTED Final   Bordetella pertussis NOT DETECTED NOT DETECTED Final   Chlamydophila pneumoniae NOT DETECTED NOT DETECTED Final   Mycoplasma pneumoniae NOT DETECTED NOT DETECTED Final    Comment: Performed at [facility], [address], [place], [place] [address]  MRSA PCR Screening     Status: Abnormal   Collection Time: 01/06/19  5:57 AM  Result Value Ref Range Status   MRSA by PCR POSITIVE (A) NEGATIVE Final    Comment:        The GeneXpert MRSA Assay (FDA approved for NASAL specimens only), is one component of a comprehensive MRSA colonization surveillance program. It is not intended to diagnose MRSA infection nor to guide or monitor treatment for MRSA infections. RESULT CALLED TO, READ BACK BY AND VERIFIED WITH: RN S  WARD [phone] [id number] MLM Performed at [facility], [address] [place], [place] [address]          Radiology Studies: Dg Chest Portable 1 View  Result Date: 01/06/2019 CLINICAL DATA:  Status post intubation EXAM: PORTABLE CHEST 1 VIEW COMPARISON:  10/05/2018 FINDINGS: Endotracheal tube and gastric catheter are noted in satisfactory position. Cardiac shadow is mildly enlarged. Increased central vascular congestion is noted with diffuse pulmonary edema. No sizable effusion is noted. No bony abnormality is seen. IMPRESSION: Changes consistent with CHF and pulmonary edema. Electronically Signed   By: Inez Catalina M.D.   On: 01/06/2019 03:41        Scheduled Meds:  budesonide (PULMICORT) nebulizer solution  0.25 mg Nebulization BID   chlorhexidine gluconate (MEDLINE KIT)  15 mL Mouth Rinse BID   [START ON 01/08/2019] Chlorhexidine Gluconate Cloth  6 each Topical Q0600   feeding supplement (PRO-STAT SUGAR FREE 64)  60 mL Per Tube QID   furosemide  40 mg Intravenous Daily   insulin aspart  0-15 Units Subcutaneous Q4H   ipratropium-albuterol  3 mL Nebulization Q6H   mouth rinse  15 mL Mouth Rinse 10 times per day   methylPREDNISolone (SOLU-MEDROL) injection  40 mg Intravenous Daily   metoprolol tartrate  5 mg Intravenous Q6H   mupirocin ointment  1 application Nasal BID   pantoprazole (PROTONIX) IV  40 mg Intravenous QHS   Continuous Infusions:  sodium chloride 10 mL/hr at 01/07/19 1100   feeding supplement (JEVITY 1.2 CAL) 20 mL/hr at 01/07/19 0100   heparin 1,750 Units/hr (01/07/19 1100)   propofol (DIPRIVAN) infusion 40  mcg/kg/min (01/07/19 1100)     LOS: 1 day    Time spent: 30 minutes    Rawad Bochicchio Darleen Crocker, DO [facility] Pager [phone]  If 7PM-7AM, please contact night-coverage [email or web address] Password [email or web address] 01/07/2019, 11:29 AM

## 2019-01-08 ENCOUNTER — Inpatient Hospital Stay (HOSPITAL_COMMUNITY)
Admission: EM | Disposition: A | Discharge status: Home / Self Care | Payer: Self-pay | Source: Home / Self Care | Attending: Internal Medicine

## 2019-01-08 ENCOUNTER — Other Ambulatory Visit: Payer: Self-pay

## 2019-01-08 ENCOUNTER — Encounter (HOSPITAL_COMMUNITY): Payer: Self-pay

## 2019-01-08 DIAGNOSIS — I2510 Atherosclerotic heart disease of native coronary artery without angina pectoris: Secondary | ICD-10-CM

## 2019-01-08 HISTORY — PX: RIGHT/LEFT HEART CATH AND CORONARY ANGIOGRAPHY: CATH118266

## 2019-01-08 LAB — EXPECTORATED SPUTUM ASSESSMENT W GRAM STAIN, RFLX TO RESP C

## 2019-01-08 LAB — GLUCOSE, CAPILLARY
Glucose-Capillary: 109 mg/dL — ABNORMAL HIGH (ref 70–99)
Glucose-Capillary: 132 mg/dL — ABNORMAL HIGH (ref 70–99)
Glucose-Capillary: 163 mg/dL — ABNORMAL HIGH (ref 70–99)
Glucose-Capillary: 83 mg/dL (ref 70–99)
Glucose-Capillary: 86 mg/dL (ref 70–99)
Glucose-Capillary: 99 mg/dL (ref 70–99)

## 2019-01-08 LAB — BASIC METABOLIC PANEL
Anion gap: 12 (ref 5–15)
BUN: 31 mg/dL — ABNORMAL HIGH (ref 6–20)
CO2: 25 mmol/L (ref 22–32)
Calcium: 8.7 mg/dL — ABNORMAL LOW (ref 8.9–10.3)
Chloride: 104 mmol/L (ref 98–111)
Creatinine, Ser: 1.65 mg/dL — ABNORMAL HIGH (ref 0.61–1.24)
GFR calc Af Amer: 55 mL/min — ABNORMAL LOW (ref >60)
GFR calc non Af Amer: 47 mL/min — ABNORMAL LOW (ref >60)
Glucose, Bld: 106 mg/dL — ABNORMAL HIGH (ref 70–99)
Potassium: 3.4 mmol/L — ABNORMAL LOW (ref 3.5–5.1)
Sodium: 141 mmol/L (ref 135–145)

## 2019-01-08 LAB — CBC WITH DIFFERENTIAL/PLATELET
Abs Immature Granulocytes: 0.03 10*3/uL (ref 0.00–0.07)
Basophils Absolute: 0 10*3/uL (ref 0.0–0.1)
Basophils Relative: 0 %
Eosinophils Absolute: 0 10*3/uL (ref 0.0–0.5)
Eosinophils Relative: 0 %
HCT: 43.8 % (ref 39.0–52.0)
Hemoglobin: 13.6 g/dL (ref 13.0–17.0)
Immature Granulocytes: 0 %
Lymphocytes Relative: 17 %
Lymphs Abs: 2 10*3/uL (ref 0.7–4.0)
MCH: 28.3 pg (ref 26.0–34.0)
MCHC: 31.1 g/dL (ref 30.0–36.0)
MCV: 91.1 fL (ref 80.0–100.0)
Monocytes Absolute: 0.8 10*3/uL (ref 0.1–1.0)
Monocytes Relative: 7 %
Neutro Abs: 8.6 10*3/uL — ABNORMAL HIGH (ref 1.7–7.7)
Neutrophils Relative %: 76 %
Platelets: 272 10*3/uL (ref 150–400)
RBC: 4.81 MIL/uL (ref 4.22–5.81)
RDW: 16 % — ABNORMAL HIGH (ref 11.5–15.5)
WBC: 11.5 10*3/uL — ABNORMAL HIGH (ref 4.0–10.5)
nRBC: 0 % (ref 0.0–0.2)

## 2019-01-08 LAB — POCT I-STAT EG7
Acid-Base Excess: 4 mmol/L — ABNORMAL HIGH (ref 0.0–2.0)
Acid-Base Excess: 4 mmol/L — ABNORMAL HIGH (ref 0.0–2.0)
Bicarbonate: 29.8 mmol/L — ABNORMAL HIGH (ref 20.0–28.0)
Bicarbonate: 30.9 mmol/L — ABNORMAL HIGH (ref 20.0–28.0)
Calcium, Ion: 1.14 mmol/L — ABNORMAL LOW (ref 1.15–1.40)
Calcium, Ion: 1.21 mmol/L (ref 1.15–1.40)
HCT: 42 % (ref 39.0–52.0)
HCT: 42 % (ref 39.0–52.0)
Hemoglobin: 14.3 g/dL (ref 13.0–17.0)
Hemoglobin: 14.3 g/dL (ref 13.0–17.0)
O2 Saturation: 67 %
O2 Saturation: 71 %
Potassium: 4 mmol/L (ref 3.5–5.1)
Potassium: 4.1 mmol/L (ref 3.5–5.1)
Sodium: 141 mmol/L (ref 135–145)
Sodium: 142 mmol/L (ref 135–145)
TCO2: 31 mmol/L (ref 22–32)
TCO2: 32 mmol/L (ref 22–32)
pCO2, Ven: 48.9 mmHg (ref 44.0–60.0)
pCO2, Ven: 52 mmHg (ref 44.0–60.0)
pH, Ven: 7.382 (ref 7.250–7.430)
pH, Ven: 7.393 (ref 7.250–7.430)
pO2, Ven: 36 mmHg (ref 32.0–45.0)
pO2, Ven: 38 mmHg (ref 32.0–45.0)

## 2019-01-08 LAB — POCT I-STAT 7, (LYTES, BLD GAS, ICA,H+H)
Acid-Base Excess: 4 mmol/L — ABNORMAL HIGH (ref 0.0–2.0)
Bicarbonate: 30.2 mmol/L — ABNORMAL HIGH (ref 20.0–28.0)
Calcium, Ion: 1.22 mmol/L (ref 1.15–1.40)
HCT: 42 % (ref 39.0–52.0)
Hemoglobin: 14.3 g/dL (ref 13.0–17.0)
O2 Saturation: 94 %
Potassium: 4.2 mmol/L (ref 3.5–5.1)
Sodium: 141 mmol/L (ref 135–145)
TCO2: 32 mmol/L (ref 22–32)
pCO2 arterial: 49.4 mmHg — ABNORMAL HIGH (ref 32.0–48.0)
pH, Arterial: 7.394 (ref 7.350–7.450)
pO2, Arterial: 74 mmHg — ABNORMAL LOW (ref 83.0–108.0)

## 2019-01-08 LAB — MAGNESIUM: Magnesium: 2.3 mg/dL (ref 1.7–2.4)

## 2019-01-08 LAB — EXPECTORATED SPUTUM ASSESSMENT W REFEX TO RESP CULTURE

## 2019-01-08 LAB — POCT ACTIVATED CLOTTING TIME: Activated Clotting Time: 169 seconds

## 2019-01-08 LAB — HEPARIN LEVEL (UNFRACTIONATED): Heparin Unfractionated: 0.45 IU/mL (ref 0.30–0.70)

## 2019-01-08 SURGERY — RIGHT/LEFT HEART CATH AND CORONARY ANGIOGRAPHY
Anesthesia: LOCAL

## 2019-01-08 MED ORDER — HEPARIN (PORCINE) IN NACL 1000-0.9 UT/500ML-% IV SOLN
INTRAVENOUS | Status: DC | PRN
  Administered 2019-01-08 (×2): 500 mL

## 2019-01-08 MED ORDER — HYDRALAZINE HCL 20 MG/ML IJ SOLN
INTRAMUSCULAR | Status: AC
  Filled 2019-01-08: qty 1

## 2019-01-08 MED ORDER — VERAPAMIL HCL 2.5 MG/ML IV SOLN
INTRAVENOUS | Status: DC | PRN
  Administered 2019-01-08: 10 mL via INTRA_ARTERIAL

## 2019-01-08 MED ORDER — PREDNISONE 20 MG PO TABS
40.0000 mg | ORAL_TABLET | Freq: Every day | ORAL | Status: AC
  Administered 2019-01-09 – 2019-01-11 (×3): 40 mg via ORAL
  Filled 2019-01-08 (×3): qty 2

## 2019-01-08 MED ORDER — INSULIN ASPART 100 UNIT/ML ~~LOC~~ SOLN
0.0000 [IU] | Freq: Three times a day (TID) | SUBCUTANEOUS | Status: DC
  Administered 2019-01-09 (×2): 3 [IU] via SUBCUTANEOUS
  Administered 2019-01-09 – 2019-01-10 (×3): 2 [IU] via SUBCUTANEOUS
  Administered 2019-01-10: 17:00:00 3 [IU] via SUBCUTANEOUS
  Administered 2019-01-11: 5 [IU] via SUBCUTANEOUS
  Administered 2019-01-11: 23:00:00 3 [IU] via SUBCUTANEOUS
  Administered 2019-01-11 – 2019-01-12 (×2): 2 [IU] via SUBCUTANEOUS
  Administered 2019-01-13: 07:00:00 3 [IU] via SUBCUTANEOUS

## 2019-01-08 MED ORDER — SODIUM CHLORIDE 0.9% FLUSH: 3.0000 mL | INTRAVENOUS | Status: DC | PRN

## 2019-01-08 MED ORDER — IOHEXOL 350 MG/ML SOLN
INTRAVENOUS | Status: DC | PRN
  Administered 2019-01-08: 120 mL via INTRAVENOUS

## 2019-01-08 MED ORDER — LIDOCAINE HCL (PF) 1 % IJ SOLN
INTRAMUSCULAR | Status: AC
  Filled 2019-01-08: qty 30

## 2019-01-08 MED ORDER — SODIUM CHLORIDE 0.9 % IV SOLN
INTRAVENOUS | Status: AC
  Administered 2019-01-08: 18:00:00 via INTRAVENOUS

## 2019-01-08 MED ORDER — PNEUMOCOCCAL VAC POLYVALENT 25 MCG/0.5ML IJ INJ
0.5000 mL | INJECTION | INTRAMUSCULAR | Status: AC
  Administered 2019-01-09: 09:00:00 0.5 mL via INTRAMUSCULAR
  Filled 2019-01-08: qty 0.5

## 2019-01-08 MED ORDER — ONDANSETRON HCL 4 MG/2ML IJ SOLN: 4.0000 mg | Freq: Four times a day (QID) | INTRAMUSCULAR | Status: DC | PRN

## 2019-01-08 MED ORDER — POTASSIUM CHLORIDE CRYS ER 20 MEQ PO TBCR
40.0000 meq | EXTENDED_RELEASE_TABLET | Freq: Once | ORAL | Status: AC
  Administered 2019-01-08: 40 meq via ORAL
  Filled 2019-01-08: qty 2

## 2019-01-08 MED ORDER — ASPIRIN 81 MG PO CHEW
81.0000 mg | CHEWABLE_TABLET | ORAL | Status: AC
  Administered 2019-01-08: 81 mg via ORAL
  Filled 2019-01-08: qty 1

## 2019-01-08 MED ORDER — HYDRALAZINE HCL 20 MG/ML IJ SOLN: 10.0000 mg | INTRAMUSCULAR | Status: AC | PRN

## 2019-01-08 MED ORDER — CLOPIDOGREL BISULFATE 75 MG PO TABS
300.0000 mg | ORAL_TABLET | Freq: Once | ORAL | Status: AC
  Administered 2019-01-08: 21:00:00 300 mg via ORAL
  Filled 2019-01-08: qty 4

## 2019-01-08 MED ORDER — ASPIRIN 81 MG PO CHEW: 81.0000 mg | CHEWABLE_TABLET | ORAL | Status: DC

## 2019-01-08 MED ORDER — SODIUM CHLORIDE 0.9 % IV SOLN: 250.0000 mL | INTRAVENOUS | Status: DC | PRN

## 2019-01-08 MED ORDER — FENTANYL CITRATE (PF) 100 MCG/2ML IJ SOLN
INTRAMUSCULAR | Status: AC
  Filled 2019-01-08: qty 2

## 2019-01-08 MED ORDER — HEPARIN SODIUM (PORCINE) 1000 UNIT/ML IJ SOLN
INTRAMUSCULAR | Status: AC
  Filled 2019-01-08: qty 1

## 2019-01-08 MED ORDER — SODIUM CHLORIDE 0.9% FLUSH: 3.0000 mL | Freq: Two times a day (BID) | INTRAVENOUS | Status: DC

## 2019-01-08 MED ORDER — CLOPIDOGREL BISULFATE 75 MG PO TABS
75.0000 mg | ORAL_TABLET | Freq: Every day | ORAL | Status: DC
  Administered 2019-01-09 – 2019-01-13 (×5): 75 mg via ORAL
  Filled 2019-01-08 (×5): qty 1

## 2019-01-08 MED ORDER — ENOXAPARIN SODIUM 40 MG/0.4ML ~~LOC~~ SOLN
40.0000 mg | SUBCUTANEOUS | Status: DC
  Administered 2019-01-09 – 2019-01-11 (×3): 40 mg via SUBCUTANEOUS
  Filled 2019-01-08 (×3): qty 0.4

## 2019-01-08 MED ORDER — VERAPAMIL HCL 2.5 MG/ML IV SOLN
INTRAVENOUS | Status: AC
  Filled 2019-01-08: qty 2

## 2019-01-08 MED ORDER — METOPROLOL TARTRATE 25 MG PO TABS
25.0000 mg | ORAL_TABLET | Freq: Two times a day (BID) | ORAL | Status: DC
  Administered 2019-01-08 – 2019-01-10 (×4): 25 mg via ORAL
  Filled 2019-01-08 (×4): qty 1

## 2019-01-08 MED ORDER — ACETAMINOPHEN 325 MG PO TABS: 650.0000 mg | ORAL_TABLET | ORAL | Status: DC | PRN

## 2019-01-08 MED ORDER — FENTANYL CITRATE (PF) 100 MCG/2ML IJ SOLN
INTRAMUSCULAR | Status: DC | PRN
  Administered 2019-01-08 (×2): 25 ug via INTRAVENOUS

## 2019-01-08 MED ORDER — HEPARIN (PORCINE) IN NACL 1000-0.9 UT/500ML-% IV SOLN
INTRAVENOUS | Status: AC
  Filled 2019-01-08: qty 1000

## 2019-01-08 MED ORDER — SODIUM CHLORIDE 0.9 % IV SOLN: INTRAVENOUS | Status: DC

## 2019-01-08 MED ORDER — LIDOCAINE HCL (PF) 1 % IJ SOLN
INTRAMUSCULAR | Status: DC | PRN
  Administered 2019-01-08: 7 mL
  Administered 2019-01-08: 20 mL

## 2019-01-08 MED ORDER — MIDAZOLAM HCL 2 MG/2ML IJ SOLN
INTRAMUSCULAR | Status: AC
  Filled 2019-01-08: qty 2

## 2019-01-08 MED ORDER — SODIUM CHLORIDE 0.9% FLUSH
3.0000 mL | Freq: Two times a day (BID) | INTRAVENOUS | Status: DC
  Administered 2019-01-08 – 2019-01-12 (×7): 3 mL via INTRAVENOUS

## 2019-01-08 MED ORDER — LABETALOL HCL 5 MG/ML IV SOLN: 10.0000 mg | INTRAVENOUS | Status: AC | PRN

## 2019-01-08 MED ORDER — MIDAZOLAM HCL 2 MG/2ML IJ SOLN
INTRAMUSCULAR | Status: DC | PRN
  Administered 2019-01-08 (×2): 1 mg via INTRAVENOUS

## 2019-01-08 MED ORDER — HEPARIN SODIUM (PORCINE) 1000 UNIT/ML IJ SOLN
INTRAMUSCULAR | Status: DC | PRN
  Administered 2019-01-08: 7000 [IU] via INTRAVENOUS

## 2019-01-08 MED ORDER — IPRATROPIUM-ALBUTEROL 0.5-2.5 (3) MG/3ML IN SOLN: 3.0000 mL | Freq: Four times a day (QID) | RESPIRATORY_TRACT | Status: DC | PRN

## 2019-01-08 SURGICAL SUPPLY — 18 items
CATH 5FR JL3.5 JR4 ANG PIG MP (CATHETERS) ×2 IMPLANT
CATH BALLN WEDGE 5F 110CM (CATHETERS) ×2 IMPLANT
CATH INFINITI 5 FR LCB (CATHETERS) ×2 IMPLANT
CATH INFINITI 5FR AL1 (CATHETERS) ×2 IMPLANT
CATH INFINITI 5FR JL4 (CATHETERS) ×2 IMPLANT
DEVICE RAD COMP TR BAND LRG (VASCULAR PRODUCTS) ×2 IMPLANT
GLIDESHEATH SLEND SS 6F .021 (SHEATH) ×4 IMPLANT
GUIDEWIRE INQWIRE 1.5J.035X260 (WIRE) ×1 IMPLANT
INQWIRE 1.5J .035X260CM (WIRE) ×2
KIT HEART LEFT (KITS) ×2 IMPLANT
KIT MICROPUNCTURE NIT STIFF (SHEATH) ×2 IMPLANT
PACK CARDIAC CATHETERIZATION (CUSTOM PROCEDURE TRAY) ×2 IMPLANT
SHEATH GLIDE SLENDER 4/5FR (SHEATH) ×2 IMPLANT
SHEATH PINNACLE 5F 10CM (SHEATH) ×2 IMPLANT
SHEATH PROBE COVER 6X72 (BAG) ×2 IMPLANT
TRANSDUCER W/STOPCOCK (MISCELLANEOUS) ×2 IMPLANT
TUBING CIL FLEX 10 FLL-RA (TUBING) IMPLANT
WIRE EMERALD 3MM-J .035X150CM (WIRE) ×2 IMPLANT

## 2019-01-08 NOTE — Interval H&P Note (Signed)
History and Physical Interval Note:  01/08/2019 3:57 PM  Chris Powell  has presented today for surgery, with the diagnosis of Heart failure.  The various methods of treatment have been discussed with the patient and family. After consideration of risks, benefits and other options for treatment, the patient has consented to  Procedure(s): RIGHT/LEFT HEART CATH AND CORONARY ANGIOGRAPHY (N/A) and possible coronary angioplasty as a surgical intervention.  The patient's history has been reviewed, patient examined, no change in status, stable for surgery.  I have reviewed the patient's chart and labs.  Questions were answered to the patient's satisfaction.     Daniel Bensimhon

## 2019-01-08 NOTE — Progress Notes (Addendum)
PROGRESS NOTE    Chris Powell  ZOX:[id number] DOB: [birth year] DOA: 01/06/2019 PCP: Raliegh Ip, DO   Brief Narrative:  Per HPI: 53 year old M found unresponsive by EMS and presented to ED for respiratory distress. Per family, patient became acutely short of breath this evening, and exhibiting agonal respiratory patterns upon EMS arrival. [facility] airway placed by EMS. Upon arrival to ED, patient with abnormal ECG without evidence of STEMI, positive troponin. Septic workup initiated however labs were hemolyzed.  Received 125 solumedrol in ED as well as 40 Lasix.   Patient was intubated and ruled out for COVID-19.  Respiratory viral panel otherwise has remained negative and he was started on Rocephin and azithromycin.  He was also noted to have diffuse pulmonary edema and vascular congestion for which Lasix was initiated.  Troponins have trended up overnight and patient is noted to be positive for cocaine.  He has been started on heparin drip and 2D echocardiogram demonstrates diminished LVEF of 40 to 45% compared to 55 to 60% in 2016.  Patient self extubated overnight and is currently doing well from a respiratory standpoint on 4 L nasal cannula.  He did diurese a little over 2 L as well.  Cardiology planning for left and right heart catheterization this afternoon.  Stable for transfer to progressive unit.  Assessment & Plan:   Principal Problem:   Acute hypercapnic respiratory failure (HCC) Active Problems:   Morbid obesity-BMI 51   COPD with exacerbation (HCC)   Type 2 diabetes mellitus with renal complication (HCC)   Acute pulmonary edema (HCC)   AKI (acute kidney injury) (HCC)   Elevated troponin   1. Acute hypoxemic and hypercapnic respiratory failure in the setting of pulmonary edema and COPD.    Patient has self extubated overnight.  He is overall doing much better with no signs of bronchospasms or volume overload currently noted.  Will discontinue Lasix and switch  bronchodilators to as needed.  Continue on Pulmicort and switch IV methylprednisolone to oral prednisone.  Continue to wean oxygen as tolerated. 2. Troponin elevation with possible ACS/NSTEMI.  This appears to be in the setting of cocaine use.  Appreciate cardiology consultation with plans for left and right heart catheterization this afternoon and transfer to progressive unit.  Will switch IV metoprolol to p.o.  Continue other medications per cardiology. Changed to NPO for cath later today. 3. Acute systolic CHF decompensation secondary to above-improved.  Continue to monitor I's and O's and daily weights.  Hold further Lasix with -2.3 L fluid balance noted overnight.  Currently remains on 4 L nasal cannula and will likely require some further diuresis after catheterization.  Wean oxygen as tolerated. 4. Mild hypokalemia.  Likely secondary to diuresis.  Will order oral repletion as well as magnesium level in a.m. 5. Hyperglycemia-improved.  Monitor closely maintain on SSI.  Tapering steroids. 6. Cocaine abuse.  Will need drug counseling and resources. 7. AKI-improved.  Baseline creatinine 1-1.1.    Hold further Lasix and continue to monitor strict I's and O's as well as repeat labs in a.m.   DVT prophylaxis: Heparin drip Code Status: Full code Family Communication: We will contact family Disposition Plan:  Management per cardiology at this point with left and right heart catheterization planned for this afternoon.  Transfer to progressive unit.  Duo nebs to as needed and methylprednisolone switched to prednisone.  Continue on heparin drip.  Discontinue Lasix.   Consultants:   PCCM  Cardiology  Procedures:  None  Antimicrobials:   Azithromycin 4/15-4/15  Rocephin 4/15-4/15  Subjective: Patient seen and evaluated today with no new acute complaints or concerns, he denies any chest pain or shortness of breath.  Currently remains on 4 L nasal cannula and self extubated last night.   He has diuresed approximately 2 L of fluid.  Cardiology planning for left and right heart catheterization this afternoon.  Objective: Vitals:   01/08/19 0851 01/08/19 0900 01/08/19 0904 01/08/19 1000  BP:  (!) 193/139 (!) 182/109 115/70  Pulse:  82 77 83  Resp:  16 13 20   Temp:      TempSrc:      SpO2: 96% 98% 94% 96%  Weight:      Height:        Intake/Output Summary (Last 24 hours) at 01/08/2019 1021 Last data filed at 01/08/2019 1000 Gross per 24 hour  Intake 1530.57 ml  Output 4065 ml  Net -2534.43 ml   Filed Weights   01/06/19 0600 01/07/19 0400 01/08/19 0407  Weight: (!) 157.9 kg (!) 156.5 kg (!) 152.4 kg    Examination:  General exam: Appears calm and comfortable  Respiratory system: Clear to auscultation. Respiratory effort normal. Currently on 4L . Cardiovascular system: S1 & S2 heard, RRR. No JVD, murmurs, rubs, gallops or clicks. No pedal edema. Gastrointestinal system: Abdomen is nondistended, soft and nontender. No organomegaly or masses felt. Normal bowel sounds heard. Central nervous system: Alert and oriented. No focal neurological deficits. Extremities: Symmetric 5 x 5 power. Skin: No rashes, lesions or ulcers Psychiatry: Judgement and insight appear normal. Mood & affect appropriate.     Data Reviewed: I have personally reviewed following labs and imaging studies  CBC: Recent Labs  Lab 01/06/19 0445 01/06/19 0824 01/06/19 1237 01/07/19 0318 01/08/19 0615  WBC  --  11.1*  --  13.6* 11.5*  NEUTROABS  --  10.1*  --   --  8.6*  HGB 14.6 14.1 15.3 13.8 13.6  HCT 43.0 44.8 45.0 43.6 43.8  MCV  --  89.2  --  89.5 91.1  PLT  --  299  --  243 272   Basic Metabolic Panel: Recent Labs  Lab 01/06/19 0337 01/06/19 0346 01/06/19 0442 01/06/19 0445 01/06/19 1237 01/07/19 0318 01/07/19 1041 01/08/19 0615  NA 139 142  --  142 141 140  --  141  K 3.9 3.6  --  3.8 3.0* 3.8  --  3.4*  CL 106  --   --   --   --  105  --  104  CO2 20*  --   --   --    --  24  --  25  GLUCOSE 206*  --   --   --   --  134*  --  106*  BUN 14  --   --   --   --  21*  --  31*  CREATININE 1.71*  --   --   --   --  2.15*  --  1.65*  CALCIUM 8.2*  --   --   --   --  8.8*  --  8.7*  MG  --   --  2.6*  --   --   --  2.4 2.3  PHOS  --   --  6.3*  --   --   --   --   --    GFR: Estimated Creatinine Clearance: 75.6 mL/min (A) (by C-G formula based on SCr  of 1.65 mg/dL (H)). Liver Function Tests: Recent Labs  Lab 01/06/19 0337  AST 65*  ALT 37  ALKPHOS 83  BILITOT 0.9  PROT 6.4*  ALBUMIN 3.1*   No results for input(s): LIPASE, AMYLASE in the last 168 hours. No results for input(s): AMMONIA in the last 168 hours. Coagulation Profile: No results for input(s): INR, PROTIME in the last 168 hours. Cardiac Enzymes: Recent Labs  Lab 01/06/19 1251 01/06/19 1827 01/07/19 1041 01/07/19 1440 01/07/19 2205  TROPONINI 1.92* 4.66* 8.13* 6.62* 7.46*   BNP (last 3 results) No results for input(s): PROBNP in the last 8760 hours. HbA1C: No results for input(s): HGBA1C in the last 72 hours. CBG: Recent Labs  Lab 01/07/19 1553 01/07/19 2001 01/07/19 2359 01/08/19 0427 01/08/19 0750  GLUCAP 151* 130* 99 83 86   Lipid Profile: No results for input(s): CHOL, HDL, LDLCALC, TRIG, CHOLHDL, LDLDIRECT in the last 72 hours. Thyroid Function Tests: No results for input(s): TSH, T4TOTAL, FREET4, T3FREE, THYROIDAB in the last 72 hours. Anemia Panel: No results for input(s): VITAMINB12, FOLATE, FERRITIN, TIBC, IRON, RETICCTPCT in the last 72 hours. Sepsis Labs: Recent Labs  Lab 01/06/19 0442  PROCALCITON <0.10    Recent Results (from the past 240 hour(s))  Blood culture (routine x 2)     Status: None (Preliminary result)   Collection Time: 01/06/19  3:37 AM  Result Value Ref Range Status   Specimen Description BLOOD RIGHT HAND  Final   Special Requests   Final    BOTTLES DRAWN AEROBIC ONLY Blood Culture results may not be optimal due to an inadequate volume  of blood received in culture bottles   Culture   Final    NO GROWTH 2 DAYS Performed at [facility], [address] [place], [place] [address]    Report Status PENDING  Incomplete  Blood culture (routine x 2)     Status: None (Preliminary result)   Collection Time: 01/06/19  3:41 AM  Result Value Ref Range Status   Specimen Description BLOOD RIGHT HAND  Final   Special Requests   Final    BOTTLES DRAWN AEROBIC AND ANAEROBIC Blood Culture adequate volume   Culture   Final    NO GROWTH 2 DAYS Performed at [facility], [address] [place], [place] [address]    Report Status PENDING  Incomplete  SARS Coronavirus 2 [facility](Hospital order, Performed in [facility] hospital lab)     Status: None   Collection Time: 01/06/19  3:44 AM  Result Value Ref Range Status   SARS Coronavirus 2 NEGATIVE NEGATIVE Final    Comment: (NOTE) If result is NEGATIVE SARS-CoV-2 target nucleic acids are NOT DETECTED. The SARS-CoV-2 RNA is generally detectable in upper and lower  respiratory specimens during the acute phase of infection. The lowest  concentration of SARS-CoV-2 viral copies this assay can detect is 250  copies / mL. A negative result does not preclude SARS-CoV-2 infection  and should not be used as the sole basis for treatment or other  patient management decisions.  A negative result may occur with  improper specimen collection / handling, submission of specimen other  than nasopharyngeal swab, presence of viral mutation(s) within the  areas targeted by this assay, and inadequate number of viral copies  (<250 copies / mL). A negative result must be combined with clinical  observations, patient history, and epidemiological information. If result is POSITIVE SARS-CoV-2 target nucleic acids are DETECTED. The SARS-CoV-2 RNA is generally  detectable in upper and lower  respiratory specimens dur ing the acute phase of infection.  Positive  results are indicative of active infection  with SARS-CoV-2.  Clinical  correlation with patient history and other diagnostic information is  necessary to determine patient infection status.  Positive results do  not rule out bacterial infection or co-infection with other viruses. If result is PRESUMPTIVE POSTIVE SARS-CoV-2 nucleic acids MAY BE PRESENT.   A presumptive positive result was obtained on the submitted specimen  and confirmed on repeat testing.  While 2019 novel coronavirus  (SARS-CoV-2) nucleic acids may be present in the submitted sample  additional confirmatory testing may be necessary for epidemiological  and / or clinical management purposes  to differentiate between  SARS-CoV-2 and other Sarbecovirus currently known to infect humans.  If clinically indicated additional testing with an alternate test  methodology [id number]) is advised. The SARS-CoV-2 RNA is generally  detectable in upper and lower respiratory sp ecimens during the acute  phase of infection. The expected result is Negative. Fact Sheet for Patients:  BoilerBrush.com.cy Fact Sheet for Healthcare Providers: https://pope.com/ This test is not yet approved or cleared by the [place] FDA and has been authorized for detection and/or diagnosis of SARS-CoV-2 by FDA under an Emergency Use Authorization (EUA).  This EUA will remain in effect (meaning this test can be used) for the duration of the COVID-19 declaration under Section 564(b)(1) of the Act, 21 U.S.C. section 360bbb-3(b)(1), unless the authorization is terminated or revoked sooner. Performed at [facility], [address] [place], [place] [address]   Urine culture     Status: None   Collection Time: 01/06/19  4:05 AM  Result Value Ref Range Status   Specimen Description URINE, RANDOM  Final   Special Requests NONE  Final   Culture   Final    NO GROWTH Performed at [facility], [address], [place], [place] [address]     Report Status 01/07/2019 FINAL  Final  Respiratory Panel by PCR     Status: None   Collection Time: 01/06/19  5:57 AM  Result Value Ref Range Status   Adenovirus NOT DETECTED NOT DETECTED Final   Coronavirus 229E NOT DETECTED NOT DETECTED Final    Comment: (NOTE) The Coronavirus on the Respiratory Panel, DOES NOT test for the novel  Coronavirus (2019 nCoV)    Coronavirus HKU1 NOT DETECTED NOT DETECTED Final   Coronavirus NL63 NOT DETECTED NOT DETECTED Final   Coronavirus OC43 NOT DETECTED NOT DETECTED Final   Metapneumovirus NOT DETECTED NOT DETECTED Final   Rhinovirus / Enterovirus NOT DETECTED NOT DETECTED Final   Influenza A NOT DETECTED NOT DETECTED Final   Influenza B NOT DETECTED NOT DETECTED Final   Parainfluenza Virus 1 NOT DETECTED NOT DETECTED Final   Parainfluenza Virus 2 NOT DETECTED NOT DETECTED Final   Parainfluenza Virus 3 NOT DETECTED NOT DETECTED Final   Parainfluenza Virus 4 NOT DETECTED NOT DETECTED Final   Respiratory Syncytial Virus NOT DETECTED NOT DETECTED Final   Bordetella pertussis NOT DETECTED NOT DETECTED Final   Chlamydophila pneumoniae NOT DETECTED NOT DETECTED Final   Mycoplasma pneumoniae NOT DETECTED NOT DETECTED Final    Comment: Performed at [facility], [address], [place], [place] [address]  MRSA PCR Screening     Status: Abnormal   Collection Time: 01/06/19  5:57 AM  Result Value Ref Range Status   MRSA by PCR POSITIVE (A) NEGATIVE Final  Comment:        The GeneXpert MRSA Assay (FDA approved for NASAL specimens only), is one component of a comprehensive MRSA colonization surveillance program. It is not intended to diagnose MRSA infection nor to guide or monitor treatment for MRSA infections. RESULT CALLED TO, READ BACK BY AND VERIFIED WITH: RN S WARD [phone] MLM Performed at [facility], [address], [place], [place] [address]   Expectorated sputum assessment w rflx to resp cult     Status: None    Collection Time: 01/07/19  6:18 PM  Result Value Ref Range Status   Specimen Description EXPECTORATED SPUTUM  Final   Special Requests NONE  Final   Sputum evaluation   Final    Sputum specimen not acceptable for testing.  Please recollect.   RESULT CALLED TO, READ BACK BY AND VERIFIED WITH: Catalina Pizza RN 01/07/19 1917 JDW Performed at [facility], [address], [place], [place] [address]    Report Status 01/07/2019 FINAL  Final  Expectorated sputum assessment w rflx to resp cult     Status: None   Collection Time: 01/07/19  9:19 PM  Result Value Ref Range Status   Specimen Description EXPECTORATED SPUTUM  Final   Special Requests NONE  Final   Sputum evaluation   Final    THIS SPECIMEN IS ACCEPTABLE FOR SPUTUM CULTURE Performed at [facility], [address], [place], [place] [address]    Report Status 01/08/2019 FINAL  Final  Culture, respiratory     Status: None (Preliminary result)   Collection Time: 01/07/19  9:19 PM  Result Value Ref Range Status   Specimen Description EXPECTORATED SPUTUM  Final   Special Requests NONE Reflexed from [id number]  Final   Gram Stain   Final    MODERATE WBC PRESENT, PREDOMINANTLY PMN MODERATE GRAM NEGATIVE RODS MODERATE GRAM POSITIVE COCCI Performed at [facility], [address] [place], [place] [address]    Culture PENDING  Incomplete   Report Status PENDING  Incomplete         Radiology Studies: No results found.      Scheduled Meds: . budesonide (PULMICORT) nebulizer solution  0.25 mg Nebulization BID  . Chlorhexidine Gluconate Cloth  6 each Topical Q0600  . insulin aspart  0-15 Units Subcutaneous Q4H  . isosorbide-hydrALAZINE  1 tablet Oral TID  . mouth rinse  15 mL Mouth Rinse BID  . metoprolol tartrate  25 mg Oral BID  . mupirocin ointment  1 application Nasal BID  . pantoprazole (PROTONIX) IV  40 mg Intravenous QHS  . potassium chloride  40 mEq Oral Once  . [START ON 01/09/2019] predniSONE  40 mg Oral Q  breakfast   Continuous Infusions: . sodium chloride Stopped (01/07/19 1450)  . heparin 1,750 Units/hr (01/08/19 1000)     LOS: 2 days    Time spent: 30 minutes    Ada Woodbury Hoover Brunette, DO [facility] Pager [phone]  If 7PM-7AM, please contact night-coverage [email or web address] Password [facility] 01/08/2019, 10:21 AM

## 2019-01-08 NOTE — Progress Notes (Signed)
NAME:  Chris Powell, MRN:  [id number], DOB:  07/31/1966, LOS: 2 ADMISSION DATE:  01/06/2019, CONSULTATION DATE:  4/15 REFERRING MD:  Ward - EM , CHIEF COMPLAINT:  Respiratory failure   Brief History   53 yo M admitted 4/15 with acute onset SOB, on EMS arrival he was agonal / unresponsive.  Intubated.  UDS + cocaine.  Abnormal ECG without evidence of STEMI.  Rx with heparin gtt.  CXR consistent with pulmonary edema.  COVID negative.  PCCM asked to admit.   Past Medical History  CHF, CAD, HTN, obesity, COPD, HLD, OSA on home CPAP, depression, GERD, anemia   Significant Hospital Events   4/15 Intubated, admitted  4/16 self extubated  Consults:  Cardiology PCCM  Procedures:  ETT 4/15 >>4/16  Significant Diagnostic Tests:  CXR 4/15 > cardiomegaly, diffuse pulm edema pattern with vascular congestion   Micro Data:  UCx 4/15> negative BCx 4/15 >  RVP 4/15 > negative Tracheal aspirate 4/15 >  COVID-19 4/15 > negative  Antimicrobials:  Azithromycin 4/15  Rocephin 4/15  Interim history/subjective:  Self extubated yesterday afternoon. He states he has not been taking his diuretics as instructed, and has not been using his CPAP due to not being able to find a comfortable mask. He wonders when he can be discharged but understands what needs to be done prior to discharge.  Objective   Blood pressure (!) 182/109, pulse 77, temperature 99.5 F (37.5 C), temperature source Oral, resp. rate 13, height 5\' 8"  (1.727 m), weight (!) 152.4 kg, SpO2 94 %.    Vent Mode: CPAP;PSV FiO2 (%):  [40 %] 40 % PEEP:  [8 cmH20] 8 cmH20 Pressure Support:  [8 cmH20] 8 cmH20   Intake/Output Summary (Last 24 hours) at 01/08/2019 1000 Last data filed at 01/08/2019 0950 Gross per 24 hour  Intake 1275.6 ml  Output 4390 ml  Net -3114.4 ml   Filed Weights   01/06/19 0600 01/07/19 0400 01/08/19 0407  Weight: (!) 157.9 kg (!) 156.5 kg (!) 152.4 kg    Examination: Gen: NAD, sitting up on side of bed  HEENT: Douds in place, anicteric sclera CV: RRR, no m/r/g Resp: CTAB (just finished neb tx), no increased work of breathing Abd: distended, soft, NT Ext: Minimal LE edema  Resolved Hospital Problem list     Assessment & Plan:   Acute Hypoxemic / Hypercapnic Respiratory Failure  Exacerbation of chronic combined CHF: In setting of cocaine use, OSA not on CPAP, COPD, underlying sCHF with inconsistent diuretic use with admitting CXR showing pulmonary edema. COVID negative on admission. He self extubated yesterday afternoon and has been on 5L [place]. He has diuresed well. - wean oxygen as able - continue diuresis; if getting cath may hold off on diuresis today   - continue bidil; hold lisinopril, spironolactone   NSTEMI CAD s/p LAD DES 2017: Troponin initially peaked at 8.1 with trend down, now up again to 7.5; this is in setting of cocaine use, CHF exacerbation and untreated OSA. He has been on heparin drip with cardiology following. Initial plan was to hold off on cath, but his uptrending troponin may change the plan. - f/u cards recs - continue heparin drip, atorvastatin, asa  COPD OSA, not compliant with CPAP Day 2 of solumedrol; can start weaning tomorrow -continue duoneb/pulmicort -qhs CPAP  Hyperglycemia SSI   Acute Kidney Injury Baseline Cr 1-1.1; improving with diuresis. - trend renal function panel - replace electrolytes as indicated  Best practice:  Diet: HH/CM  Pain/Anxiety/Delirium protocol (if indicated): PRNs  VAP protocol (if indicated): h/a DVT prophylaxis: Heparin gtt per pharmacy  GI prophylaxis: protonix  Glucose control: SSI  Mobility: up with assistance Code Status: full Family Communication: none  Disposition: ICU   Labs   CBC: Recent Labs  Lab 01/06/19 0445 01/06/19 0824 01/06/19 1237 01/07/19 0318 01/08/19 0615  WBC  --  11.1*  --  13.6* 11.5*  NEUTROABS  --  10.1*  --   --  8.6*  HGB 14.6 14.1 15.3 13.8 13.6  HCT 43.0 44.8 45.0 43.6 43.8   MCV  --  89.2  --  89.5 91.1  PLT  --  299  --  243 272    Basic Metabolic Panel: Recent Labs  Lab 01/06/19 0337 01/06/19 0346 01/06/19 0442 01/06/19 0445 01/06/19 1237 01/07/19 0318 01/07/19 1041 01/08/19 0615  NA 139 142  --  142 141 140  --  141  K 3.9 3.6  --  3.8 3.0* 3.8  --  3.4*  CL 106  --   --   --   --  105  --  104  CO2 20*  --   --   --   --  24  --  25  GLUCOSE 206*  --   --   --   --  134*  --  106*  BUN 14  --   --   --   --  21*  --  31*  CREATININE 1.71*  --   --   --   --  2.15*  --  1.65*  CALCIUM 8.2*  --   --   --   --  8.8*  --  8.7*  MG  --   --  2.6*  --   --   --  2.4 2.3  PHOS  --   --  6.3*  --   --   --   --   --    GFR: Estimated Creatinine Clearance: 75.6 mL/min (A) (by C-G formula based on SCr of 1.65 mg/dL (H)). Recent Labs  Lab 01/06/19 0442 01/06/19 0824 01/07/19 0318 01/08/19 0615  PROCALCITON <0.10  --   --   --   WBC  --  11.1* 13.6* 11.5*    Liver Function Tests: Recent Labs  Lab 01/06/19 0337  AST 65*  ALT 37  ALKPHOS 83  BILITOT 0.9  PROT 6.4*  ALBUMIN 3.1*   No results for input(s): LIPASE, AMYLASE in the last 168 hours. No results for input(s): AMMONIA in the last 168 hours.  ABG    Component Value Date/Time   PHART 7.471 (H) 01/06/2019 1237   PCO2ART 30.7 (L) 01/06/2019 1237   PO2ART 61.0 (L) 01/06/2019 1237   HCO3 22.5 01/06/2019 1237   TCO2 23 01/06/2019 1237   ACIDBASEDEF 2.0 01/06/2019 0445   O2SAT 93.0 01/06/2019 1237     Coagulation Profile: No results for input(s): INR, PROTIME in the last 168 hours.  Cardiac Enzymes: Recent Labs  Lab 01/06/19 1251 01/06/19 1827 01/07/19 1041 01/07/19 1440 01/07/19 2205  TROPONINI 1.92* 4.66* 8.13* 6.62* 7.46*    HbA1C: HB A1C (BAYER DCA - WAIVED)  Date/Time Value Ref Range Status  04/07/2018 10:52 AM 5.6 <7.0 % Final    Comment:  Diabetic Adult            <7.0                                       Healthy  Adult        4.3 - 5.7                                                           (DCCT/NGSP) American Diabetes Association's Summary of Glycemic Recommendations for Adults with Diabetes: Hemoglobin A1c <7.0%. More stringent glycemic goals (A1c <6.0%) may further reduce complications at the cost of increased risk of hypoglycemia.   10/31/2017 03:27 PM 5.3 <7.0 % Final    Comment:                                          Diabetic Adult            <7.0                                       Healthy Adult        4.3 - 5.7                                                           (DCCT/NGSP) American Diabetes Association's Summary of Glycemic Recommendations for Adults with Diabetes: Hemoglobin A1c <7.0%. More stringent glycemic goals (A1c <6.0%) may further reduce complications at the cost of increased risk of hypoglycemia.    Hgb A1c MFr Bld  Date/Time Value Ref Range Status  11/20/2017 01:45 PM 6.0 (H) 4.8 - 5.6 % Final    Comment:             Prediabetes: 5.7 - 6.4          Diabetes: >6.4          Glycemic control for adults with diabetes: <7.0     CBG: Recent Labs  Lab 01/07/19 1553 01/07/19 2001 01/07/19 2359 01/08/19 0427 01/08/19 0750  GLUCAP 151* 130* 99 83 86    Nyra Market, MD PGY3 Pager [phone]  01/08/2019, 10:00 AM

## 2019-01-08 NOTE — H&P (View-Only) (Signed)
° ° °Advanced Heart Failure Rounding Note ° ° °Subjective:   ° °Self extubated yesterday. Diuresed well. ° °Denies CP or SOB. BP now better controlled.  Trop peaked at 8.1 ° ° ° °Objective:   °Weight Range: ° °Vital Signs:   °Temp:  [98.2 °F (36.8 °C)-99.5 °F (37.5 °C)] 99.5 °F (37.5 °C) (04/17 0749) °Pulse Rate:  [57-90] 83 (04/17 1000) °Resp:  [13-30] 20 (04/17 1000) °BP: (110-193)/(70-141) 115/70 (04/17 1000) °SpO2:  [91 %-98 %] 96 % (04/17 1000) °FiO2 (%):  [40 %] 40 % (04/16 1543) °Weight:  [152.4 kg] 152.4 kg (04/17 0407) °Last BM Date: 01/05/19 ° °Weight change: °Filed Weights  ° 01/06/19 0600 01/07/19 0400 01/08/19 0407  °Weight: (!) 157.9 kg (!) 156.5 kg (!) 152.4 kg  ° ° °Intake/Output:  ° °Intake/Output Summary (Last 24 hours) at 01/08/2019 1030 °Last data filed at 01/08/2019 1000 °Gross per 24 hour  °Intake 1530.57 ml  °Output 4065 ml  °Net -2534.43 ml  °  ° °Physical Exam: °General:  Sitting up in bed. No resp difficulty °HEENT: normal °Neck: supple. JVP hard to see. Carotids 2+ bilat; no bruits. No lymphadenopathy or thryomegaly appreciated. °Cor: PMI nondisplaced. Regular rate & rhythm. No rubs, gallops or murmurs. °Lungs: clear °Abdomen: obese soft, nontender, nondistended. No hepatosplenomegaly. No bruits or masses. Good bowel sounds. °Extremities: no cyanosis, clubbing, rash, edema °Neuro: alert & orientedx3, cranial nerves grossly intact. moves all 4 extremities w/o difficulty. Affect pleasant ° °Telemetry: NSR 80s Personally reviewed ° ° °Labs: °Basic Metabolic Panel: °Recent Labs  °Lab 01/06/19 °0337 01/06/19 °0346 01/06/19 °0442 01/06/19 °0445 01/06/19 °1237 01/07/19 °0318 01/07/19 °1041 01/08/19 °0615  °NA 139 142  --  142 141 140  --  141  °K 3.9 3.6  --  3.8 3.0* 3.8  --  3.4*  °CL 106  --   --   --   --  105  --  104  °CO2 20*  --   --   --   --  24  --  25  °GLUCOSE 206*  --   --   --   --  134*  --  106*  °BUN 14  --   --   --   --  21*  --  31*  °CREATININE 1.71*  --   --   --   --  2.15*   --  1.65*  °CALCIUM 8.2*  --   --   --   --  8.8*  --  8.7*  °MG  --   --  2.6*  --   --   --  2.4 2.3  °PHOS  --   --  6.3*  --   --   --   --   --   ° ° °Liver Function Tests: °Recent Labs  °Lab 01/06/19 °0337  °AST 65*  °ALT 37  °ALKPHOS 83  °BILITOT 0.9  °PROT 6.4*  °ALBUMIN 3.1*  ° °No results for input(s): LIPASE, AMYLASE in the last 168 hours. °No results for input(s): AMMONIA in the last 168 hours. ° °CBC: °Recent Labs  °Lab 01/06/19 °0445 01/06/19 °0824 01/06/19 °1237 01/07/19 °0318 01/08/19 °0615  °WBC  --  11.1*  --  13.6* 11.5*  °NEUTROABS  --  10.1*  --   --  8.6*  °HGB 14.6 14.1 15.3 13.8 13.6  °HCT 43.0 44.8 45.0 43.6 43.8  °MCV  --  89.2  --  89.5 91.1  °PLT  --  299  --    243 272    Cardiac Enzymes: Recent Labs  Lab 01/06/19 1251 01/06/19 1827 01/07/19 1041 01/07/19 1440 01/07/19 2205  TROPONINI 1.92* 4.66* 8.13* 6.62* 7.46*    BNP: BNP (last 3 results) Recent Labs    10/05/18 1023 01/06/19 0442  BNP 294.0* 431.5*    ProBNP (last 3 results) No results for input(s): PROBNP in the last 8760 hours.    Other results:  Imaging:  No results found.   Medications:     Scheduled Medications:  budesonide (PULMICORT) nebulizer solution  0.25 mg Nebulization BID   Chlorhexidine Gluconate Cloth  6 each Topical Q0600   insulin aspart  0-15 Units Subcutaneous Q4H   isosorbide-hydrALAZINE  1 tablet Oral TID   mouth rinse  15 mL Mouth Rinse BID   metoprolol tartrate  25 mg Oral BID   mupirocin ointment  1 application Nasal BID   pantoprazole (PROTONIX) IV  40 mg Intravenous QHS   potassium chloride  40 mEq Oral Once   [START ON 01/09/2019] predniSONE  40 mg Oral Q breakfast     Infusions:  sodium chloride Stopped (01/07/19 1450)   heparin 1,750 Units/hr (01/08/19 1000)     PRN Medications:  sodium chloride, acetaminophen, bisacodyl, docusate, fentaNYL (SUBLIMAZE) injection, hydrALAZINE, ipratropium-albuterol, labetalol, midazolam, ondansetron  (ZOFRAN) IV   Assessment:   Chris Powell is a 53 y.o. male with a hx of CAD s/p NSTEMI 2016 treated with DES to LAD, HTN, OSA on CPAP, DM,COPDand ongoing tobacco smokingwho is being seen today for the evaluation of elevated troponin and respiratory failure at the request of Dr. Colletta Maryland.  Plan/Discussion:     1. Acute hypoxic respiratory failure: -now extubated - suspect due to flash pulmonary edema in setting of HTN crisis/cocaine use - improved with diuresis and BP control   2.Elevated troponi/NSTEMI: -Pt has known CAD with prior LAD stenting in 2016. Last cath performed 11/2017 secondary to episode of chest pain. Cath showed patent LAD stent and an LVEF of 45-50%  - Troponin peaked at 8.1 - EKG with chronic lateral T wave abnormalities but no acute ST segment changes  - Likely due to demand ischemia but cannot exclude ACS with troponin at this level - I discussed options with him and wants to proceed with cath for definitive eval. Will proceed with R/L cath today. Minimize contrast.   3. HTN crisis: - BP improved - Adjust meds as needed. Lisinopril on hold with AKI.  - Has Medicaid so likely can switch hydral to Bidil with low EF.   4. Acute systolic heart failure: -Echocardiogram 01/06/2019 with LVEF of 40-45% with diffuse hypokinesis -LV gram on cath last year with similar LV function  -CXR with acute pulmonary edema -Treated with IV Lasix 40mg  with good response - Plan R/L cath today.  - will need to titrate HF meds   5. AKI: -Creatinine, 2.15 -> 1.65 today with a baseline of 1.0-1.3 - Likely can resume ACE/ARB after cath   6. Cocaine use - discussed need for cessation    Length of Stay: 2   Arvilla Meres MD 01/08/2019, 10:30 AM  Advanced Heart Failure Team Pager [phone] (M-F; 7a - 4p)  Please contact [facility] for night-coverage after hours (4p -7a ) and weekends on [email or web address]

## 2019-01-08 NOTE — CV Procedure (Signed)
    5 Fr R F/A sheath was pulled manually, and pressure was held for 20 min. BP was high, and 10 mg of Hydralazine was given IV push. Sterile gauze was applied at the site.   R DP was palpable, before and after the sheath pull.   Bed rest started at 1845 X 4 hr. Instructions were given to the patient about his limitations during this period.      HR 83 SR   BP 173/93   sPO2 96% on R/A

## 2019-01-08 NOTE — Progress Notes (Signed)
Advanced Heart Failure Rounding Note   Subjective:    Self extubated yesterday. Diuresed well.  Denies CP or SOB. BP now better controlled.  Trop peaked at 8.1    Objective:   Weight Range:  Vital Signs:   Temp:  [98.2 F (36.8 C)-99.5 F (37.5 C)] 99.5 F (37.5 C) (04/17 0749) Pulse Rate:  [57-90] 83 (04/17 1000) Resp:  [13-30] 20 (04/17 1000) BP: (110-193)/(70-141) 115/70 (04/17 1000) SpO2:  [91 %-98 %] 96 % (04/17 1000) FiO2 (%):  [40 %] 40 % (04/16 1543) Weight:  [152.4 kg] 152.4 kg (04/17 0407) Last BM Date: 01/05/19  Weight change: Filed Weights   01/06/19 0600 01/07/19 0400 01/08/19 0407  Weight: (!) 157.9 kg (!) 156.5 kg (!) 152.4 kg    Intake/Output:   Intake/Output Summary (Last 24 hours) at 01/08/2019 1030 Last data filed at 01/08/2019 1000 Gross per 24 hour  Intake 1530.57 ml  Output 4065 ml  Net -2534.43 ml     Physical Exam: General:  Sitting up in bed. No resp difficulty HEENT: normal Neck: supple. JVP hard to see. Carotids 2+ bilat; no bruits. No lymphadenopathy or thryomegaly appreciated. Cor: PMI nondisplaced. Regular rate & rhythm. No rubs, gallops or murmurs. Lungs: clear Abdomen: obese soft, nontender, nondistended. No hepatosplenomegaly. No bruits or masses. Good bowel sounds. Extremities: no cyanosis, clubbing, rash, edema Neuro: alert & orientedx3, cranial nerves grossly intact. moves all 4 extremities w/o difficulty. Affect pleasant  Telemetry: NSR 80s Personally reviewed   Labs: Basic Metabolic Panel: Recent Labs  Lab 01/06/19 0337 01/06/19 0346 01/06/19 0442 01/06/19 0445 01/06/19 1237 01/07/19 0318 01/07/19 1041 01/08/19 0615  NA 139 142  --  142 141 140  --  141  K 3.9 3.6  --  3.8 3.0* 3.8  --  3.4*  CL 106  --   --   --   --  105  --  104  CO2 20*  --   --   --   --  24  --  25  GLUCOSE 206*  --   --   --   --  134*  --  106*  BUN 14  --   --   --   --  21*  --  31*  CREATININE 1.71*  --   --   --   --  2.15*   --  1.65*  CALCIUM 8.2*  --   --   --   --  8.8*  --  8.7*  MG  --   --  2.6*  --   --   --  2.4 2.3  PHOS  --   --  6.3*  --   --   --   --   --     Liver Function Tests: Recent Labs  Lab 01/06/19 0337  AST 65*  ALT 37  ALKPHOS 83  BILITOT 0.9  PROT 6.4*  ALBUMIN 3.1*   No results for input(s): LIPASE, AMYLASE in the last 168 hours. No results for input(s): AMMONIA in the last 168 hours.  CBC: Recent Labs  Lab 01/06/19 0445 01/06/19 0824 01/06/19 1237 01/07/19 0318 01/08/19 0615  WBC  --  11.1*  --  13.6* 11.5*  NEUTROABS  --  10.1*  --   --  8.6*  HGB 14.6 14.1 15.3 13.8 13.6  HCT 43.0 44.8 45.0 43.6 43.8  MCV  --  89.2  --  89.5 91.1  PLT  --  299  --  243 272    Cardiac Enzymes: Recent Labs  Lab 01/06/19 1251 01/06/19 1827 01/07/19 1041 01/07/19 1440 01/07/19 2205  TROPONINI 1.92* 4.66* 8.13* 6.62* 7.46*    BNP: BNP (last 3 results) Recent Labs    10/05/18 1023 01/06/19 0442  BNP 294.0* 431.5*    ProBNP (last 3 results) No results for input(s): PROBNP in the last 8760 hours.    Other results:  Imaging:  No results found.   Medications:     Scheduled Medications:  budesonide (PULMICORT) nebulizer solution  0.25 mg Nebulization BID   Chlorhexidine Gluconate Cloth  6 each Topical Q0600   insulin aspart  0-15 Units Subcutaneous Q4H   isosorbide-hydrALAZINE  1 tablet Oral TID   mouth rinse  15 mL Mouth Rinse BID   metoprolol tartrate  25 mg Oral BID   mupirocin ointment  1 application Nasal BID   pantoprazole (PROTONIX) IV  40 mg Intravenous QHS   potassium chloride  40 mEq Oral Once   [START ON 01/09/2019] predniSONE  40 mg Oral Q breakfast     Infusions:  sodium chloride Stopped (01/07/19 1450)   heparin 1,750 Units/hr (01/08/19 1000)     PRN Medications:  sodium chloride, acetaminophen, bisacodyl, docusate, fentaNYL (SUBLIMAZE) injection, hydrALAZINE, ipratropium-albuterol, labetalol, midazolam, ondansetron  (ZOFRAN) IV   Assessment:   Chris Powell is a 53 y.o. male with a hx of CAD s/p NSTEMI 2016 treated with DES to LAD, HTN, OSA on CPAP, DM,COPDand ongoing tobacco smokingwho is being seen today for the evaluation of elevated troponin and respiratory failure at the request of Dr. Colletta Maryland.  Plan/Discussion:     1. Acute hypoxic respiratory failure: -now extubated - suspect due to flash pulmonary edema in setting of HTN crisis/cocaine use - improved with diuresis and BP control   2.Elevated troponi/NSTEMI: -Pt has known CAD with prior LAD stenting in 2016. Last cath performed 11/2017 secondary to episode of chest pain. Cath showed patent LAD stent and an LVEF of 45-50%  - Troponin peaked at 8.1 - EKG with chronic lateral T wave abnormalities but no acute ST segment changes  - Likely due to demand ischemia but cannot exclude ACS with troponin at this level - I discussed options with him and wants to proceed with cath for definitive eval. Will proceed with R/L cath today. Minimize contrast.   3. HTN crisis: - BP improved - Adjust meds as needed. Lisinopril on hold with AKI.  - Has Medicaid so likely can switch hydral to Bidil with low EF.   4. Acute systolic heart failure: -Echocardiogram 01/06/2019 with LVEF of 40-45% with diffuse hypokinesis -LV gram on cath last year with similar LV function  -CXR with acute pulmonary edema -Treated with IV Lasix 40mg  with good response - Plan R/L cath today.  - will need to titrate HF meds   5. AKI: -Creatinine, 2.15 -> 1.65 today with a baseline of 1.0-1.3 - Likely can resume ACE/ARB after cath   6. Cocaine use - discussed need for cessation    Length of Stay: 2   Arvilla Meres MD 01/08/2019, 10:30 AM  Advanced Heart Failure Team Pager [phone] (M-F; 7a - 4p)  Please contact [facility] for night-coverage after hours (4p -7a ) and weekends on [email or web address]

## 2019-01-08 NOTE — Progress Notes (Signed)
TR BAND REMOVAL  LOCATION:    Left radial  DEFLATED PER PROTOCOL:    Yes.    TIME BAND OFF / DRESSING APPLIED:    21:15   SITE UPON ARRIVAL:    Level 0  SITE AFTER BAND REMOVAL:    Level 0  CIRCULATION SENSATION AND MOVEMENT:    Within Normal Limits   Yes.    COMMENTS:   Post TR band instructions given. Pt tolerated well.

## 2019-01-08 NOTE — Progress Notes (Signed)
eLink Physician-Brief Progress Note Patient Name: Chris Powell DOB: 04/19/1966 MRN: [id number]   Date of Service  01/08/2019  HPI/Events of Note  Request for AM labs.   eICU Interventions  Will order: 1. CBC with platelets, BMP and Mg++ level at 5 AM.       Intervention Category Major Interventions: Other:  Lenell Antu 01/08/2019, 4:42 AM

## 2019-01-08 NOTE — Progress Notes (Signed)
Pharmacy is unable to confirm the medications the patient was taking at home. All options have been exhausted and a resolution to the situation is not expected.   Where possible, their outpatient pharmacy(s) have been contacted for the last time prescriptions were filled and that information has been added to each medication in an Order Note (highlighted yellow below the medication).  Please contact pharmacy if further assistance is needed.   Charolotte Eke, PharmD. Mobile: [phone]. 01/08/2019,10:40 AM.

## 2019-01-08 NOTE — Progress Notes (Signed)
ANTICOAGULATION CONSULT NOTE  Pharmacy Consult for heparin Indication: chest pain/ACS  No Known Allergies  Patient Measurements: Height: 5\' 8"  (172.7 cm) Weight: (!) 335 lb 15.7 oz (152.4 kg) IBW/kg (Calculated) : 68.4 Heparin Dosing Weight: 106.4 kg  Vital Signs: Temp: 99.5 F (37.5 C) (04/17 0749) Temp Source: Oral (04/17 0749) BP: 115/70 (04/17 1000) Pulse Rate: 83 (04/17 1000)  Labs: Recent Labs    01/06/19 0337  01/06/19 0824  01/06/19 1237  01/07/19 0318 01/07/19 1041 01/07/19 1440 01/07/19 2205 01/08/19 0615 01/08/19 0804  HGB  --    < > 14.1  --  15.3  --  13.8  --   --   --  13.6  --   HCT  --    < > 44.8  --  45.0  --  43.6  --   --   --  43.8  --   PLT  --   --  299  --   --   --  243  --   --   --  272  --   HEPARINUNFRC  --   --   --    < >  --    < > 0.27* 0.53  --   --   --  0.45  CREATININE 1.71*  --   --   --   --   --  2.15*  --   --   --  1.65*  --   TROPONINI  --    < >  --   --   --    < >  --  8.13* 6.62* 7.46*  --   --    < > = values in this interval not displayed.    Estimated Creatinine Clearance: 75.6 mL/min (A) (by C-G formula based on SCr of 1.65 mg/dL (H)).   Medical History: Past Medical History:  Diagnosis Date  . Anemia   . Arthritis   . Asthma   . Bone spur of ankle    right   . COPD (chronic obstructive pulmonary disease) (HCC)   . Coronary artery disease   . Depression   . Diabetes (HCC)    borderline   . GERD (gastroesophageal reflux disease)   . Hyperlipidemia   . Hypertension   . Myocardial infarction (HCC)   . Pneumonia    HX OF PNA  . Right rotator cuff tear 01/06/2018  . Shortness of breath dyspnea   . Sleep apnea    USES CPAP    Medications:  Scheduled:  . budesonide (PULMICORT) nebulizer solution  0.25 mg Nebulization BID  . Chlorhexidine Gluconate Cloth  6 each Topical Q0600  . insulin aspart  0-15 Units Subcutaneous Q4H  . isosorbide-hydrALAZINE  1 tablet Oral TID  . mouth rinse  15 mL Mouth Rinse  BID  . metoprolol tartrate  25 mg Oral BID  . mupirocin ointment  1 application Nasal BID  . pantoprazole (PROTONIX) IV  40 mg Intravenous QHS  . potassium chloride  40 mEq Oral Once  . [START ON 01/09/2019] predniSONE  40 mg Oral Q breakfast   Infusions:  . sodium chloride Stopped (01/07/19 1450)  . heparin 1,750 Units/hr (01/08/19 1000)    Assessment: 53 yo M with a history of MI in March 2016 (proximal LAD stent placed). Pharmacy consulted to start IV heparin for ACS/STEMI. Patient not on anticoagulation PTA.   Cardiac cath planned for patient today will continue heparin. Heparin level is therapeutic at 0.45,  on 1750 units/hr. Hgb 13.6, plt 272. No s/sx of bleeding. No infusion issues  Goal of Therapy:  Heparin level 0.3-0.7 units/ml Monitor platelets by anticoagulation protocol: Yes   Plan:  Continue heparin IV at 1750 units/hr Daily heparin level and CBC  Monitor for signs/symptoms of bleeding  Ewing Schlein, PharmD PGY1 Pharmacy Resident 01/08/2019    10:54 AM Please check AMION for all [facility] numbers

## 2019-01-09 LAB — GLUCOSE, CAPILLARY
Glucose-Capillary: 105 mg/dL — ABNORMAL HIGH (ref 70–99)
Glucose-Capillary: 154 mg/dL — ABNORMAL HIGH (ref 70–99)
Glucose-Capillary: 140 mg/dL — ABNORMAL HIGH (ref 70–99)
Glucose-Capillary: 140 mg/dL — ABNORMAL HIGH (ref 70–99)

## 2019-01-09 LAB — BASIC METABOLIC PANEL
Anion gap: 10 (ref 5–15)
BUN: 21 mg/dL — ABNORMAL HIGH (ref 6–20)
CO2: 27 mmol/L (ref 22–32)
Calcium: 8.8 mg/dL — ABNORMAL LOW (ref 8.9–10.3)
Chloride: 104 mmol/L (ref 98–111)
Creatinine, Ser: 1.29 mg/dL — ABNORMAL HIGH (ref 0.61–1.24)
GFR calc Af Amer: >60 mL/min (ref >60)
GFR calc non Af Amer: >60 mL/min (ref >60)
Glucose, Bld: 96 mg/dL (ref 70–99)
Potassium: 3.6 mmol/L (ref 3.5–5.1)
Sodium: 141 mmol/L (ref 135–145)

## 2019-01-09 LAB — CBC
HCT: 42.9 % (ref 39.0–52.0)
Hemoglobin: 13.3 g/dL (ref 13.0–17.0)
MCH: 28.1 pg (ref 26.0–34.0)
MCHC: 31 g/dL (ref 30.0–36.0)
MCV: 90.7 fL (ref 80.0–100.0)
Platelets: 263 10*3/uL (ref 150–400)
RBC: 4.73 MIL/uL (ref 4.22–5.81)
RDW: 15.8 % — ABNORMAL HIGH (ref 11.5–15.5)
WBC: 9.5 10*3/uL (ref 4.0–10.5)
nRBC: 0 % (ref 0.0–0.2)

## 2019-01-09 LAB — MAGNESIUM: Magnesium: 2.2 mg/dL (ref 1.7–2.4)

## 2019-01-09 MED ORDER — GABAPENTIN 600 MG PO TABS
600.0000 mg | ORAL_TABLET | Freq: Once | ORAL | Status: AC
  Administered 2019-01-09: 20:00:00 600 mg via ORAL
  Filled 2019-01-09: qty 1

## 2019-01-09 MED ORDER — PANTOPRAZOLE SODIUM 40 MG PO TBEC
40.0000 mg | DELAYED_RELEASE_TABLET | Freq: Every day | ORAL | Status: DC
  Administered 2019-01-09 – 2019-01-12 (×4): 40 mg via ORAL
  Filled 2019-01-09 (×4): qty 1

## 2019-01-09 NOTE — Progress Notes (Signed)
Subjective:    No complaints breathing better no angina   Objective:   Weight Range:  Vital Signs:   Temp:  [98.8 F (37.1 C)] 98.8 F (37.1 C) (04/18 0618) Pulse Rate:  [67-99] 75 (04/18 0618) Resp:  [11-46] 18 (04/18 0618) BP: (111-193)/(59-139) 158/86 (04/18 0618) SpO2:  [91 %-100 %] 93 % (04/18 0737) Weight:  [156.3 kg] 156.3 kg (04/18 0618) Last BM Date: 01/05/19  Weight change: Filed Weights   01/07/19 0400 01/08/19 0407 01/09/19 0618  Weight: (!) 156.5 kg (!) 152.4 kg (!) 156.3 kg    Intake/Output:   Intake/Output Summary (Last 24 hours) at 01/09/2019 0831 Last data filed at 01/08/2019 2300 Gross per 24 hour  Intake 1020.11 ml  Output 1450 ml  Net -429.89 ml     Physical Exam: Affect appropriate Morbidly obese black male  HEENT: normal Neck supple with no adenopathy JVP normal no bruits no thyromegaly Lungs clear with no wheezing and good diaphragmatic motion Heart:  S1/S2 no murmur, no rub, gallop or click PMI normal Abdomen: benighn, BS positve, no tenderness, no AAA no bruit.  No HSM or HJR Distal pulses intact with no bruits No edema Neuro non-focal Skin warm and dry No muscular weakness Left radial artery mildly tender no hematoma    Telemetry: NSR 80s Personally reviewed   Labs: Basic Metabolic Panel: Recent Labs  Lab 01/06/19 0337  01/06/19 0442  01/07/19 0318 01/07/19 1041 01/08/19 0615 01/08/19 1613 01/08/19 1624 01/09/19 0407  NA 139   < >  --    < > 140  --  141 141 142  141 141  K 3.9   < >  --    < > 3.8  --  3.4* 4.2 4.0  4.1 3.6  CL 106  --   --   --  105  --  104  --   --  104  CO2 20*  --   --   --  24  --  25  --   --  27  GLUCOSE 206*  --   --   --  134*  --  106*  --   --  96  BUN 14  --   --   --  21*  --  31*  --   --  21*  CREATININE 1.71*  --   --   --  2.15*  --  1.65*  --   --  1.29*  CALCIUM 8.2*  --   --   --  8.8*  --  8.7*  --   --  8.8*  MG  --   --  2.6*  --   --  2.4 2.3  --   --  2.2   PHOS  --   --  6.3*  --   --   --   --   --   --   --    < > = values in this interval not displayed.    Liver Function Tests: Recent Labs  Lab 01/06/19 0337  AST 65*  ALT 37  ALKPHOS 83  BILITOT 0.9  PROT 6.4*  ALBUMIN 3.1*   No results for input(s): LIPASE, AMYLASE in the last 168 hours. No results for input(s): AMMONIA in the last 168 hours.  CBC: Recent Labs  Lab 01/06/19 0824  01/07/19 0318 01/08/19 0615 01/08/19 1613 01/08/19 1624 01/09/19 0407  WBC 11.1*  --  13.6* 11.5*  --   --  9.5  NEUTROABS 10.1*  --   --  8.6*  --   --   --   HGB 14.1   < > 13.8 13.6 14.3 14.3  14.3 13.3  HCT 44.8   < > 43.6 43.8 42.0 42.0  42.0 42.9  MCV 89.2  --  89.5 91.1  --   --  90.7  PLT 299  --  243 272  --   --  263   < > = values in this interval not displayed.    Cardiac Enzymes: Recent Labs  Lab 01/06/19 1251 01/06/19 1827 01/07/19 1041 01/07/19 1440 01/07/19 2205  TROPONINI 1.92* 4.66* 8.13* 6.62* 7.46*    BNP: BNP (last 3 results) Recent Labs    10/05/18 1023 01/06/19 0442  BNP 294.0* 431.5*    ProBNP (last 3 results) No results for input(s): PROBNP in the last 8760 hours.    Other results:  Cath 01/08/19  Assessment: 1. 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 Volume overload with moderate pulmonary venous HTN  Medications:     Scheduled Medications: . budesonide (PULMICORT) nebulizer solution  0.25 mg Nebulization BID  . Chlorhexidine Gluconate Cloth  6 each Topical Q0600  . clopidogrel  75 mg Oral Daily  . enoxaparin (LOVENOX) injection  40 mg Subcutaneous Q24H  . insulin aspart  0-15 Units Subcutaneous TID AC & HS  . isosorbide-hydrALAZINE  1 tablet Oral TID  . mouth rinse  15 mL Mouth Rinse BID  . metoprolol tartrate  25 mg Oral BID  . mupirocin ointment  1 application Nasal BID  . pantoprazole (PROTONIX) IV  40 mg Intravenous  QHS  . pneumococcal 23 valent vaccine  0.5 mL Intramuscular Tomorrow-1000  . predniSONE  40 mg Oral Q breakfast  . sodium chloride flush  3 mL Intravenous Q12H    Infusions: . sodium chloride Stopped (01/07/19 1450)  . sodium chloride      PRN Medications: sodium chloride, sodium chloride, acetaminophen, bisacodyl, docusate, fentaNYL (SUBLIMAZE) injection, hydrALAZINE, ipratropium-albuterol, labetalol, midazolam, ondansetron (ZOFRAN) IV, sodium chloride flush   Assessment:   Chris Powell is a 53 y.o. male with a hx of CAD s/p NSTEMI 2016 treated with DES to LAD, HTN, OSA on CPAP, DM,COPDand ongoing tobacco smokingwho is being seen today for the evaluation of elevated troponin and respiratory failure at the request of Dr. Colletta Marylandamaswami.  Plan/Discussion:     1. Acute hypoxic respiratory failure: -now extubated - suspect due to flash pulmonary edema in setting of HTN crisis/cocaine use - improved with diuresis and BP control   2.Elevated troponi/NSTEMI: - cath with severe 2 vessel disease for PCI Tuesday with Dr Excell Seltzerooper to LAD and anomalous RCA Continue ASA/Plavix and lovenox  3. HTN crisis: - BP improved Bidil on d/c    4. Acute systolic heart failure: -Echocardiogram 01/06/2019 with LVEF of 40-45% with diffuse hypokinesis - continue diuresis   5. AKI: -Creatinine, 2.15 -> 1.65 ->  1.29 today improved   6. Cocaine use - discussed need for cessation and risk of sudden death at home    Length of Stay: 3   Charlton HawsPeter Lexxi Koslow MD 01/09/2019, 8:31 AM

## 2019-01-09 NOTE — Progress Notes (Signed)
PROGRESS NOTE  Chris Powell ZOX:[id number] DOB: 12/28/1965 DOA: 01/06/2019 PCP: Raliegh IpGottschalk, Ashly M, DO  Brief History   53 year old M found unresponsive by EMS and presented to ED for respiratory distress. Per family, patient became acutely short of breath this evening, and exhibiting agonal respiratory patterns upon EMS arrival[facility] Upon arrival to ED, patient with abnormal ECG without evidence of STEMI, positive troponin. Septic workup initiated however labs were hemolyzed.  Received 125 solumedrol in ED as well as 40 Lasix.   Patient was intubated and ruled out for COVID-19. Respiratory viral panel otherwise has remained negative and he was started on Rocephin and azithromycin. He was also noted to have diffuse pulmonary edema and vascular congestion for which Lasix was initiated. Troponins have trended up overnight and patient is noted to be positive for cocaine. He has been started on heparin drip and 2D echocardiogram demonstrates diminished LVEF of 40 to 45% compared to 55 to 60% in 2016.  Patient self extubated overnight and is currently doing well from a respiratory standpoint on 4 L nasal cannula.  He did diurese a little over 2 L as well.  Cardiology took the patient for left and right heart catheterization on the afternoon of 01/08/2019.    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 iCM with LV EF of 30 - 35%. He is in volume overload with pulmonary venous hypertension. The patient has tolerated the procedure well.  Consultants  . Cardiology  Procedures  . LHC and RHC on 01/08/2019  Antibiotics   Anti-infectives (From admission, onward)   Start     Dose/Rate Route Frequency Ordered Stop   01/06/19 0530  azithromycin (ZITHROMAX) 500 mg in sodium chloride 0.9 % 250 mL IVPB  Status:  Discontinued     500 mg 250 mL/hr over 60 Minutes  Intravenous Every 24 hours 01/06/19 0511 01/06/19 1009   01/06/19 0530  cefTRIAXone (ROCEPHIN) 1 g in sodium chloride 0.9 % 100 mL IVPB  Status:  Discontinued     1 g 200 mL/hr over 30 Minutes Intravenous Every 24 hours 01/06/19 0515 01/06/19 1009    .  Marland Kitchen.   Interval History/Subjective  See above.  The patient is resting comfortably. No new complaints.  Objective   Vitals:  Vitals:   01/09/19 0618 01/09/19 0737  BP: (!) 158/86   Pulse: 75   Resp: 18   Temp: 98.8 F (37.1 C)   SpO2: 96% 93%    Exam:  Constitutional:  . The patient is awake, alert, and oriented x 3. No acute distress.  Respiratory:  . There is no wheezes, rales, or rhonchi.  . No tactile fremitus.  . No increased work of breathing. Cardiovascular:  . Regular rate and rhythm . No murmurs, ectopy, or gallups. . No LE extremity edema   . Normal pedal pulses Abdomen:  . Abdomen is obese.  . It is soft, non-tender, non-distended. . I am unable to evaluate the abdomen for masses, hernias, and organomegaly due to the patient's body habitus. Musculoskeletal:  . No cyanosis, clubbing or edema Skin:  . No rashes, lesions, ulcers . palpation of skin: no induration or nodules Neurologic:  . CN 2-12 intact . Sensation all 4 extremities intact Psychiatric:  . Mental status o Mood, affect appropriate o Orientation to person, place, time  . judgment and insight appear  intact   I have personally reviewed the following:   Today's Data  . CBC, BMP, Vitals  Cardiology Data  . Results of LHC and RHC.  Scheduled Meds: . budesonide (PULMICORT) nebulizer solution  0.25 mg Nebulization BID  . Chlorhexidine Gluconate Cloth  6 each Topical Q0600  . clopidogrel  75 mg Oral Daily  . enoxaparin (LOVENOX) injection  40 mg Subcutaneous Q24H  . insulin aspart  0-15 Units Subcutaneous TID AC & HS  . isosorbide-hydrALAZINE  1 tablet Oral TID  . mouth rinse  15 mL Mouth Rinse BID  . metoprolol tartrate  25 mg Oral  BID  . mupirocin ointment  1 application Nasal BID  . pantoprazole  40 mg Oral QHS  . predniSONE  40 mg Oral Q breakfast  . sodium chloride flush  3 mL Intravenous Q12H   Continuous Infusions: . sodium chloride Stopped (01/07/19 1450)  . sodium chloride      Principal Problem:   Acute hypercapnic respiratory failure (HCC) Active Problems:   Morbid obesity-BMI 51   COPD with exacerbation (HCC)   Type 2 diabetes mellitus with renal complication (HCC)   Acute pulmonary edema (HCC)   AKI (acute kidney injury) (HCC)   Elevated troponin  A & P  Acute hypoxemic and hypercapnic respiratory failure in the setting of pulmonary edema and COPD.   Patient has self extubated overnight.  He is overall doing much better with no signs of bronchospasms or volume overload currently noted.  Will discontinue Lasix and switch bronchodilators to as needed.  Continue on Pulmicort and switch IV methylprednisolone to oral prednisone.  Continue to wean oxygen as tolerated. Volume overload and pulmonary venous congestion Cath. Continue diuresis.  Troponin elevation with possible ACS/NSTEMI. This appears to be in the setting of cocaine use. Appreciate cardiology consultation with plans for left and right heart catheterization this afternoon and transfer to progressive unit.  Will switch IV metoprolol to p.o.  Continue other medications per cardiology.  Cardiology took the patient for left and right heart catheterization on the afternoon of 01/08/2019.  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 iCM with LV EF of 30 - 35%. He is in volume overload with pulmonary venous hypertension. The patient has tolerated the procedure well.  Acute systolic CHF decompensation secondary to above: Improved. EF 30-35% on LHC. Continue to monitor I's and O's and daily weights.  Hold further Lasix with -2.3 L fluid balance  noted overnight.  Currently remains on 4 L nasal cannula. Continue diuresis and wean oxygen as tolerated.  Mild hypokalemia: Resolved. Likely secondary to diuresis.  Magnesium is also replete. Continue to monitor electrolytes.  Hyperglycemia: Resolving. Monitor closely maintain on SSI.  Tapering steroids.  Cocaine abuse" This is playing a large role in this 53 yr old man's current illness. Cessation strongly encouraged. Will need drug counseling and resources.  AKI: Creatinine is improved at 1.29 this morning. Baseline creatinine 1-1.1. Monitor.  I have seen and examined this patient myself. I have spent 35 minutes in his evaluation and care today.  DVT prophylaxis:Heparin drip Code Status:Full code Family Communication: None present. Disposition Plan: Management per cardiology at this point with left and right heart catheterization planned for this afternoon.   Naveya Ellerman, DO [facility] Direct contact: see [email or web address]  7PM-7AM contact night coverage as above 01/09/2019, 4:34 PM  LOS: 3 days  LOS: 3 days

## 2019-01-10 LAB — GLUCOSE, CAPILLARY
Glucose-Capillary: 133 mg/dL — ABNORMAL HIGH (ref 70–99)
Glucose-Capillary: 126 mg/dL — ABNORMAL HIGH (ref 70–99)
Glucose-Capillary: 167 mg/dL — ABNORMAL HIGH (ref 70–99)
Glucose-Capillary: 107 mg/dL — ABNORMAL HIGH (ref 70–99)

## 2019-01-10 LAB — CULTURE, RESPIRATORY W GRAM STAIN: Culture: NORMAL

## 2019-01-10 LAB — CULTURE, RESPIRATORY

## 2019-01-10 MED ORDER — GABAPENTIN 300 MG PO CAPS
600.0000 mg | ORAL_CAPSULE | Freq: Once | ORAL | Status: AC
  Administered 2019-01-11: 600 mg via ORAL
  Filled 2019-01-10: qty 2

## 2019-01-10 MED ORDER — CARVEDILOL 25 MG PO TABS
25.0000 mg | ORAL_TABLET | Freq: Two times a day (BID) | ORAL | Status: DC
  Administered 2019-01-10 – 2019-01-13 (×5): 25 mg via ORAL
  Filled 2019-01-10 (×6): qty 1

## 2019-01-10 MED ORDER — AMLODIPINE BESYLATE 5 MG PO TABS
5.0000 mg | ORAL_TABLET | Freq: Once | ORAL | Status: AC
  Administered 2019-01-10: 15:00:00 5 mg via ORAL
  Filled 2019-01-10: qty 1

## 2019-01-10 MED ORDER — AMLODIPINE BESYLATE 10 MG PO TABS
10.0000 mg | ORAL_TABLET | Freq: Every day | ORAL | Status: DC
  Administered 2019-01-11 – 2019-01-13 (×3): 10 mg via ORAL
  Filled 2019-01-10 (×3): qty 1

## 2019-01-10 MED ORDER — MAGNESIUM HYDROXIDE 400 MG/5ML PO SUSP
30.0000 mL | Freq: Every day | ORAL | Status: DC | PRN
  Administered 2019-01-10: 30 mL via ORAL
  Filled 2019-01-10: qty 30

## 2019-01-10 MED ORDER — METOPROLOL TARTRATE 50 MG PO TABS: 50.0000 mg | ORAL_TABLET | Freq: Two times a day (BID) | ORAL | Status: DC

## 2019-01-10 MED ORDER — AMLODIPINE BESYLATE 5 MG PO TABS
5.0000 mg | ORAL_TABLET | Freq: Every day | ORAL | Status: DC
  Administered 2019-01-10: 5 mg via ORAL
  Filled 2019-01-10: qty 1

## 2019-01-10 NOTE — Progress Notes (Signed)
Subjective:    No complaints breathing better no angina No dyspnea Understands intervention to be done Tuesday   Objective:   Weight Range:  Vital Signs:   Temp:  [98.2 F (36.8 C)-98.7 F (37.1 C)] 98.6 F (37 C) (04/19 0407) Pulse Rate:  [72-78] 78 (04/19 0510) Resp:  [16-25] 19 (04/19 0407) BP: (145-185)/(91-104) 145/98 (04/19 0611) SpO2:  [95 %-100 %] 97 % (04/19 0723) Weight:  [156.5 kg] 156.5 kg (04/19 0407) Last BM Date: 01/06/19  Weight change: Filed Weights   01/08/19 0407 01/09/19 0618 01/10/19 0407  Weight: (!) 152.4 kg (!) 156.3 kg (!) 156.5 kg    Intake/Output:   Intake/Output Summary (Last 24 hours) at 01/10/2019 0800 Last data filed at 01/10/2019 0437 Gross per 24 hour  Intake 1302 ml  Output 575 ml  Net 727 ml     Physical Exam: Affect appropriate Morbidly obese black male  HEENT: normal Neck supple with no adenopathy JVP normal no bruits no thyromegaly Lungs clear with no wheezing and good diaphragmatic motion Heart:  S1/S2 no murmur, no rub, gallop or click PMI normal Abdomen: benighn, BS positve, no tenderness, no AAA no bruit.  No HSM or HJR Distal pulses intact with no bruits No edema Neuro non-focal Skin warm and dry No muscular weakness Left radial artery mildly tender no hematoma    Telemetry: NSR 80s Personally reviewed   Labs: Basic Metabolic Panel: Recent Labs  Lab 01/06/19 0337  01/06/19 0442  01/07/19 0318 01/07/19 1041 01/08/19 0615 01/08/19 1613 01/08/19 1624 01/09/19 0407  NA 139   < >  --    < > 140  --  141 141 142  141 141  K 3.9   < >  --    < > 3.8  --  3.4* 4.2 4.0  4.1 3.6  CL 106  --   --   --  105  --  104  --   --  104  CO2 20*  --   --   --  24  --  25  --   --  27  GLUCOSE 206*  --   --   --  134*  --  106*  --   --  96  BUN 14  --   --   --  21*  --  31*  --   --  21*  CREATININE 1.71*  --   --   --  2.15*  --  1.65*  --   --  1.29*  CALCIUM 8.2*  --   --   --  8.8*  --  8.7*  --   --   8.8*  MG  --   --  2.6*  --   --  2.4 2.3  --   --  2.2  PHOS  --   --  6.3*  --   --   --   --   --   --   --    < > = values in this interval not displayed.    Liver Function Tests: Recent Labs  Lab 01/06/19 0337  AST 65*  ALT 37  ALKPHOS 83  BILITOT 0.9  PROT 6.4*  ALBUMIN 3.1*   No results for input(s): LIPASE, AMYLASE in the last 168 hours. No results for input(s): AMMONIA in the last 168 hours.  CBC: Recent Labs  Lab 01/06/19 0824  01/07/19 0318 01/08/19 0615 01/08/19 1613 01/08/19 1624 01/09/19  0407  WBC 11.1*  --  13.6* 11.5*  --   --  9.5  NEUTROABS 10.1*  --   --  8.6*  --   --   --   HGB 14.1   < > 13.8 13.6 14.3 14.3  14.3 13.3  HCT 44.8   < > 43.6 43.8 42.0 42.0  42.0 42.9  MCV 89.2  --  89.5 91.1  --   --  90.7  PLT 299  --  243 272  --   --  263   < > = values in this interval not displayed.    Cardiac Enzymes: Recent Labs  Lab 01/06/19 1251 01/06/19 1827 01/07/19 1041 01/07/19 1440 01/07/19 2205  TROPONINI 1.92* 4.66* 8.13* 6.62* 7.46*    BNP: BNP (last 3 results) Recent Labs    10/05/18 1023 01/06/19 0442  BNP 294.0* 431.5*    ProBNP (last 3 results) No results for input(s): PROBNP in the last 8760 hours.    Other results:  Cath 01/08/19  Assessment: 1. 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 Volume overload with moderate pulmonary venous HTN  Medications:     Scheduled Medications: . budesonide (PULMICORT) nebulizer solution  0.25 mg Nebulization BID  . Chlorhexidine Gluconate Cloth  6 each Topical Q0600  . clopidogrel  75 mg Oral Daily  . enoxaparin (LOVENOX) injection  40 mg Subcutaneous Q24H  . insulin aspart  0-15 Units Subcutaneous TID AC & HS  . isosorbide-hydrALAZINE  1 tablet Oral TID  . mouth rinse  15 mL Mouth Rinse BID  . metoprolol tartrate  25 mg Oral BID  . mupirocin ointment  1 application  Nasal BID  . pantoprazole  40 mg Oral QHS  . predniSONE  40 mg Oral Q breakfast  . sodium chloride flush  3 mL Intravenous Q12H    Infusions: . sodium chloride Stopped (01/07/19 1450)  . sodium chloride      PRN Medications: sodium chloride, sodium chloride, acetaminophen, bisacodyl, docusate, fentaNYL (SUBLIMAZE) injection, hydrALAZINE, ipratropium-albuterol, labetalol, midazolam, ondansetron (ZOFRAN) IV, sodium chloride flush   Assessment:   Chris Powell is a 53 y.o. male with a hx of CAD s/p NSTEMI 2016 treated with DES to LAD, HTN, OSA on CPAP, DM,COPDand ongoing tobacco smokingwho is being seen today for the evaluation of elevated troponin and respiratory failure at the request of Dr. Colletta Marylandamaswami.  Plan/Discussion:     1. Acute hypoxic respiratory failure: -now extubated - suspect due to flash pulmonary edema in setting of HTN crisis/cocaine use - improved with diuresis and BP control   2.Elevated troponi/NSTEMI: - cath with severe 2 vessel disease for PCI Tuesday with Dr Excell Seltzerooper to LAD and anomalous RCA Continue ASA/Plavix and lovenox  3. HTN crisis: - BP improved Bidil on d/c    4. Acute systolic heart failure: -Echocardiogram 01/06/2019 with LVEF of 40-45% with diffuse hypokinesis - continue diuresis   5. AKI: -Creatinine, 2.15 -> 1.65 ->  1.29  yesterday  6. Cocaine use - discussed need for cessation and risk of sudden death at home    Length of Stay: 4   Charlton HawsPeter Zamorah Ailes MD 01/10/2019, 8:00 AM  Patient ID: Chris Powell, male   DOB: 06/23/1966, 53 y.o.   MRN: [id number]

## 2019-01-10 NOTE — Progress Notes (Signed)
VAST RN consulted to place PIV. Upon arrival at pt's bedside, pt requested RN come back later as he is currently bathing/getting cleaned up.

## 2019-01-10 NOTE — Progress Notes (Signed)
BP elevated 195/106, HR 74.  Given lopressor and Bidil as scheduled this morning.  Will monitor for changes.

## 2019-01-10 NOTE — Progress Notes (Signed)
PROGRESS NOTE  Chris Powell XMD:[id number] DOB: 12-30-1965 DOA: 01/06/2019 PCP: Raliegh Ip, DO  Assessment/Plan: 1. Acute hypoxemic and hypercarbic respiratory failure in setting of pulmonary edema and COPD 1. Patient doing well with no bronchospasm your volume overload.  Continue to wean oxygen 2. Continue to diurese 3. Continue prednisone 2. NSTEMI 1. Continue Lovenox 2. Continue Lopressor 3. Plan for PCI Tuesday 3. Hypertensive crisis 1. Improved 4. Hyperglycemia 1. Secondary to steroids 2. Continue sliding scale insulin 5. Cocaine abuse 1. Patient committed to cessation 6. AKI 1. BMP in the morning  Code Status: Full code Family Communication: None  Disposition Plan: Patient should be able to return home   Consultants:  Cardiology  Procedures:  Heart cath  Antibiotics: None  HPI/Subjective: Feels well denies chest pain, shortness of breath, nausea, vomiting, abdominal pain.  Breathing is much improved  Objective: Vitals:   01/10/19 1022 01/10/19 1023  BP: (!) 155/87 (!) 155/87  Pulse:  74  Resp:  18  Temp:    SpO2:  97%    Intake/Output Summary (Last 24 hours) at 01/10/2019 1236 Last data filed at 01/10/2019 1100 Gross per 24 hour  Intake 702 ml  Output 400 ml  Net 302 ml   Filed Weights   01/08/19 0407 01/09/19 0618 01/10/19 0407  Weight: (!) 152.4 kg (!) 156.3 kg (!) 156.5 kg    Exam:   General: Alert and oriented x3, no acute distress  Cardiovascular: The rate, normal S1-S2 sounds.  No murmurs.  Respiratory: Clear to auscultation bilaterally with no wheezes rales or rhonchi.  Abdomen: Soft, nontender, nondistended  Musculoskeletal: No peripheral edema  Extremity: No focal neurological deficits  Data Reviewed: Basic Metabolic Panel: Recent Labs  Lab 01/06/19 0337  01/06/19 0442  01/07/19 0318 01/07/19 1041 01/08/19 0615 01/08/19 1613 01/08/19 1624 01/09/19 0407  NA 139   < >  --    < > 140  --  141 141 142   141 141  K 3.9   < >  --    < > 3.8  --  3.4* 4.2 4.0  4.1 3.6  CL 106  --   --   --  105  --  104  --   --  104  CO2 20*  --   --   --  24  --  25  --   --  27  GLUCOSE 206*  --   --   --  134*  --  106*  --   --  96  BUN 14  --   --   --  21*  --  31*  --   --  21*  CREATININE 1.71*  --   --   --  2.15*  --  1.65*  --   --  1.29*  CALCIUM 8.2*  --   --   --  8.8*  --  8.7*  --   --  8.8*  MG  --   --  2.6*  --   --  2.4 2.3  --   --  2.2  PHOS  --   --  6.3*  --   --   --   --   --   --   --    < > = values in this interval not displayed.   Liver Function Tests: Recent Labs  Lab 01/06/19 0337  AST 65*  ALT 37  ALKPHOS 83  BILITOT 0.9  PROT 6.4*  ALBUMIN 3.1*   No results for input(s): LIPASE, AMYLASE in the last 168 hours. No results for input(s): AMMONIA in the last 168 hours. CBC: Recent Labs  Lab 01/06/19 0824  01/07/19 0318 01/08/19 0615 01/08/19 1613 01/08/19 1624 01/09/19 0407  WBC 11.1*  --  13.6* 11.5*  --   --  9.5  NEUTROABS 10.1*  --   --  8.6*  --   --   --   HGB 14.1   < > 13.8 13.6 14.3 14.3  14.3 13.3  HCT 44.8   < > 43.6 43.8 42.0 42.0  42.0 42.9  MCV 89.2  --  89.5 91.1  --   --  90.7  PLT 299  --  243 272  --   --  263   < > = values in this interval not displayed.   Cardiac Enzymes: Recent Labs  Lab 01/06/19 1251 01/06/19 1827 01/07/19 1041 01/07/19 1440 01/07/19 2205  TROPONINI 1.92* 4.66* 8.13* 6.62* 7.46*   BNP (last 3 results) Recent Labs    10/05/18 1023 01/06/19 0442  BNP 294.0* 431.5*    ProBNP (last 3 results) No results for input(s): PROBNP in the last 8760 hours.  CBG: Recent Labs  Lab 01/09/19 1228 01/09/19 1713 01/09/19 2058 01/10/19 0811 01/10/19 1136  GLUCAP 154* 140* 140* 133* 126*    Recent Results (from the past 240 hour(s))  Blood culture (routine x 2)     Status: None (Preliminary result)   Collection Time: 01/06/19  3:37 AM  Result Value Ref Range Status   Specimen Description BLOOD RIGHT HAND   Final   Special Requests   Final    BOTTLES DRAWN AEROBIC ONLY Blood Culture results may not be optimal due to an inadequate volume of blood received in culture bottles   Culture   Final    NO GROWTH 4 DAYS Performed at [facility], [address], [place], [place] [address]    Report Status PENDING  Incomplete  Blood culture (routine x 2)     Status: None (Preliminary result)   Collection Time: 01/06/19  3:41 AM  Result Value Ref Range Status   Specimen Description BLOOD RIGHT HAND  Final   Special Requests   Final    BOTTLES DRAWN AEROBIC AND ANAEROBIC Blood Culture adequate volume   Culture   Final    NO GROWTH 4 DAYS Performed at [facility], [address], [place], [place] [address]    Report Status PENDING  Incomplete  SARS Coronavirus 2 [facility] order, Performed in [facility] hospital lab)     Status: None   Collection Time: 01/06/19  3:44 AM  Result Value Ref Range Status   SARS Coronavirus 2 NEGATIVE NEGATIVE Final    Comment: (NOTE) If result is NEGATIVE SARS-CoV-2 target nucleic acids are NOT DETECTED. The SARS-CoV-2 RNA is generally detectable in upper and lower  respiratory specimens during the acute phase of infection. The lowest  concentration of SARS-CoV-2 viral copies this assay can detect is 250  copies / mL. A negative result does not preclude SARS-CoV-2 infection  and should not be used as the sole basis for treatment or other  patient management decisions.  A negative result may occur with  improper specimen collection / handling, submission of specimen other  than nasopharyngeal swab, presence of viral mutation(s) within the  areas targeted by this assay, and inadequate number of viral copies  (<250 copies / mL). A negative result  must be combined with clinical  observations, patient history, and epidemiological information. If result is POSITIVE SARS-CoV-2 target nucleic acids are DETECTED. The SARS-CoV-2 RNA is generally detectable in  upper and lower  respiratory specimens dur ing the acute phase of infection.  Positive  results are indicative of active infection with SARS-CoV-2.  Clinical  correlation with patient history and other diagnostic information is  necessary to determine patient infection status.  Positive results do  not rule out bacterial infection or co-infection with other viruses. If result is PRESUMPTIVE POSTIVE SARS-CoV-2 nucleic acids MAY BE PRESENT.   A presumptive positive result was obtained on the submitted specimen  and confirmed on repeat testing.  While 2019 novel coronavirus  (SARS-CoV-2) nucleic acids may be present in the submitted sample  additional confirmatory testing may be necessary for epidemiological  and / or clinical management purposes  to differentiate between  SARS-CoV-2 and other Sarbecovirus currently known to infect humans.  If clinically indicated additional testing with an alternate test  methodology [phone]) is advised. The SARS-CoV-2 RNA is generally  detectable in upper and lower respiratory sp ecimens during the acute  phase of infection. The expected result is Negative. Fact Sheet for Patients:  BoilerBrush.com.cy Fact Sheet for Healthcare Providers: https://pope.com/ This test is not yet approved or cleared by the [place] FDA and has been authorized for detection and/or diagnosis of SARS-CoV-2 by FDA under an Emergency Use Authorization (EUA).  This EUA will remain in effect (meaning this test can be used) for the duration of the COVID-19 declaration under Section 564(b)(1) of the Act, 21 U.S.C. section 360bbb-3(b)(1), unless the authorization is terminated or revoked sooner. Performed at [facility], [address] [place], [place] [address]   Urine culture     Status: None   Collection Time: 01/06/19  4:05 AM  Result Value Ref Range Status   Specimen Description URINE, RANDOM  Final   Special  Requests NONE  Final   Culture   Final    NO GROWTH Performed at [facility], [address] [place], [place] [address]    Report Status 01/07/2019 FINAL  Final  Respiratory Panel by PCR     Status: None   Collection Time: 01/06/19  5:57 AM  Result Value Ref Range Status   Adenovirus NOT DETECTED NOT DETECTED Final   Coronavirus 229E NOT DETECTED NOT DETECTED Final    Comment: (NOTE) The Coronavirus on the Respiratory Panel, DOES NOT test for the novel  Coronavirus (2019 nCoV)    Coronavirus HKU1 NOT DETECTED NOT DETECTED Final   Coronavirus NL63 NOT DETECTED NOT DETECTED Final   Coronavirus OC43 NOT DETECTED NOT DETECTED Final   Metapneumovirus NOT DETECTED NOT DETECTED Final   Rhinovirus / Enterovirus NOT DETECTED NOT DETECTED Final   Influenza A NOT DETECTED NOT DETECTED Final   Influenza B NOT DETECTED NOT DETECTED Final   Parainfluenza Virus 1 NOT DETECTED NOT DETECTED Final   Parainfluenza Virus 2 NOT DETECTED NOT DETECTED Final   Parainfluenza Virus 3 NOT DETECTED NOT DETECTED Final   Parainfluenza Virus 4 NOT DETECTED NOT DETECTED Final   Respiratory Syncytial Virus NOT DETECTED NOT DETECTED Final   Bordetella pertussis NOT DETECTED NOT DETECTED Final   Chlamydophila pneumoniae NOT DETECTED NOT DETECTED Final   Mycoplasma pneumoniae NOT DETECTED NOT DETECTED Final    Comment: Performed at [facility], [address] [place], [place] [address]  MRSA PCR Screening  Status: Abnormal   Collection Time: 01/06/19  5:57 AM  Result Value Ref Range Status   MRSA by PCR POSITIVE (A) NEGATIVE Final    Comment:        The GeneXpert MRSA Assay (FDA approved for NASAL specimens only), is one component of a comprehensive MRSA colonization surveillance program. It is not intended to diagnose MRSA infection nor to guide or monitor treatment for MRSA infections. RESULT CALLED TO, READ BACK BY AND VERIFIED WITH: RN S WARD [phone] MLM Performed at [facility], [address] [place], [place] [address]   Expectorated sputum assessment w rflx to resp cult     Status: None   Collection Time: 01/07/19  6:18 PM  Result Value Ref Range Status   Specimen Description EXPECTORATED SPUTUM  Final   Special Requests NONE  Final   Sputum evaluation   Final    Sputum specimen not acceptable for testing.  Please recollect.   RESULT CALLED TO, READ BACK BY AND VERIFIED WITH: Catalina PizzaL IVERY RN 01/07/19 1917 JDW Performed at [facility], [address] [place], [place] [address]    Report Status 01/07/2019 FINAL  Final  Expectorated sputum assessment w rflx to resp cult     Status: None   Collection Time: 01/07/19  9:19 PM  Result Value Ref Range Status   Specimen Description EXPECTORATED SPUTUM  Final   Special Requests NONE  Final   Sputum evaluation   Final    THIS SPECIMEN IS ACCEPTABLE FOR SPUTUM CULTURE Performed at [facility], [address] [place], [place] [address]    Report Status 01/08/2019 FINAL  Final  Culture, respiratory     Status: None (Preliminary result)   Collection Time: 01/07/19  9:19 PM  Result Value Ref Range Status   Specimen Description EXPECTORATED SPUTUM  Final   Special Requests NONE Reflexed from [id number]  Final   Gram Stain   Final    MODERATE WBC PRESENT, PREDOMINANTLY PMN MODERATE GRAM NEGATIVE RODS MODERATE GRAM POSITIVE COCCI    Culture   Final    CULTURE REINCUBATED FOR BETTER GROWTH Performed at [facility], [address] [place], [place] [address]    Report Status PENDING  Incomplete     Studies: No results found.  Scheduled Meds: . amLODipine  5 mg Oral Daily  . budesonide (PULMICORT) nebulizer solution  0.25 mg Nebulization BID  . Chlorhexidine Gluconate Cloth  6 each Topical Q0600  . clopidogrel  75 mg Oral Daily  . enoxaparin (LOVENOX) injection  40 mg Subcutaneous Q24H  . insulin aspart  0-15 Units Subcutaneous TID AC & HS  . isosorbide-hydrALAZINE  1 tablet Oral TID  .  mouth rinse  15 mL Mouth Rinse BID  . metoprolol tartrate  50 mg Oral BID  . mupirocin ointment  1 application Nasal BID  . pantoprazole  40 mg Oral QHS  . predniSONE  40 mg Oral Q breakfast  . sodium chloride flush  3 mL Intravenous Q12H   Continuous Infusions: . sodium chloride Stopped (01/07/19 1450)  . sodium chloride      Principal Problem:   Acute hypercapnic respiratory failure (HCC) Active Problems:   Morbid obesity-BMI 51   COPD with exacerbation (HCC)   Type 2 diabetes mellitus with renal complication (HCC)   Acute pulmonary edema (HCC)   AKI (acute kidney injury) (HCC)   Elevated troponin    Time spent: 27 minutes  Levie Heritage  Faculty Practice Attending Physician [facility] [place] Phone: [phone] 01/10/2019, 12:36 PM  LOS: 4 days

## 2019-01-10 NOTE — Progress Notes (Signed)
Called to see for increased B/P- 174/100 despite prn apresoline.  I gave him another dose of Amlodipine 5 mg today and increased his daily dose to 10 mg starting tomorrow.  I also changed his Lopressor 50 mg BID to Coreg 25 mg BID (staring now) in hopes of obtaining better B/P control.  Corine Shelter PA-C 01/10/2019 2:54 PM

## 2019-01-11 ENCOUNTER — Encounter (HOSPITAL_COMMUNITY): Payer: Self-pay | Admitting: Internal Medicine

## 2019-01-11 DIAGNOSIS — I214 Non-ST elevation (NSTEMI) myocardial infarction: Secondary | ICD-10-CM

## 2019-01-11 DIAGNOSIS — N179 Acute kidney failure, unspecified: Secondary | ICD-10-CM

## 2019-01-11 DIAGNOSIS — I169 Hypertensive crisis, unspecified: Secondary | ICD-10-CM

## 2019-01-11 LAB — CULTURE, BLOOD (ROUTINE X 2)
Culture: NO GROWTH
Culture: NO GROWTH
Special Requests: ADEQUATE

## 2019-01-11 LAB — BASIC METABOLIC PANEL
Anion gap: 10 (ref 5–15)
BUN: 14 mg/dL (ref 6–20)
CO2: 25 mmol/L (ref 22–32)
Calcium: 8.9 mg/dL (ref 8.9–10.3)
Chloride: 103 mmol/L (ref 98–111)
Creatinine, Ser: 1.26 mg/dL — ABNORMAL HIGH (ref 0.61–1.24)
GFR calc Af Amer: >60 mL/min (ref >60)
GFR calc non Af Amer: >60 mL/min (ref >60)
Glucose, Bld: 116 mg/dL — ABNORMAL HIGH (ref 70–99)
Potassium: 3.4 mmol/L — ABNORMAL LOW (ref 3.5–5.1)
Sodium: 138 mmol/L (ref 135–145)

## 2019-01-11 LAB — GLUCOSE, CAPILLARY
Glucose-Capillary: 99 mg/dL (ref 70–99)
Glucose-Capillary: 130 mg/dL — ABNORMAL HIGH (ref 70–99)
Glucose-Capillary: 220 mg/dL — ABNORMAL HIGH (ref 70–99)
Glucose-Capillary: 177 mg/dL — ABNORMAL HIGH (ref 70–99)

## 2019-01-11 LAB — CBC
HCT: 41.8 % (ref 39.0–52.0)
Hemoglobin: 13.1 g/dL (ref 13.0–17.0)
MCH: 27.9 pg (ref 26.0–34.0)
MCHC: 31.3 g/dL (ref 30.0–36.0)
MCV: 89.1 fL (ref 80.0–100.0)
Platelets: 242 10*3/uL (ref 150–400)
RBC: 4.69 MIL/uL (ref 4.22–5.81)
RDW: 15.1 % (ref 11.5–15.5)
WBC: 9.6 10*3/uL (ref 4.0–10.5)
nRBC: 0 % (ref 0.0–0.2)

## 2019-01-11 MED ORDER — SODIUM CHLORIDE 0.9% FLUSH
3.0000 mL | Freq: Two times a day (BID) | INTRAVENOUS | Status: DC
  Administered 2019-01-11: 23:00:00 3 mL via INTRAVENOUS

## 2019-01-11 MED ORDER — CLONIDINE HCL 0.1 MG PO TABS
0.1000 mg | ORAL_TABLET | Freq: Two times a day (BID) | ORAL | Status: DC
  Administered 2019-01-11 (×2): 0.1 mg via ORAL
  Filled 2019-01-11 (×2): qty 1

## 2019-01-11 MED ORDER — SODIUM CHLORIDE 0.9 % IV SOLN: 250.0000 mL | INTRAVENOUS | Status: DC | PRN

## 2019-01-11 MED ORDER — ATORVASTATIN CALCIUM 80 MG PO TABS
80.0000 mg | ORAL_TABLET | Freq: Every day | ORAL | Status: DC
  Administered 2019-01-11 – 2019-01-12 (×2): 80 mg via ORAL
  Filled 2019-01-11 (×2): qty 1

## 2019-01-11 MED ORDER — MENTHOL 3 MG MT LOZG
1.0000 | LOZENGE | OROMUCOSAL | Status: DC | PRN
  Administered 2019-01-11 – 2019-01-12 (×2): 3 mg via ORAL
  Filled 2019-01-11 (×2): qty 9

## 2019-01-11 MED ORDER — SODIUM CHLORIDE 0.9% FLUSH: 3.0000 mL | INTRAVENOUS | Status: DC | PRN

## 2019-01-11 MED ORDER — HYDRALAZINE HCL 50 MG PO TABS
100.0000 mg | ORAL_TABLET | Freq: Three times a day (TID) | ORAL | Status: DC
  Administered 2019-01-11 – 2019-01-13 (×6): 100 mg via ORAL
  Filled 2019-01-11 (×7): qty 2

## 2019-01-11 MED ORDER — ISOSORBIDE DINITRATE 10 MG PO TABS
20.0000 mg | ORAL_TABLET | Freq: Three times a day (TID) | ORAL | Status: DC
  Administered 2019-01-11 – 2019-01-13 (×7): 20 mg via ORAL
  Filled 2019-01-11 (×8): qty 2

## 2019-01-11 MED ORDER — ASPIRIN 81 MG PO CHEW
81.0000 mg | CHEWABLE_TABLET | ORAL | Status: AC
  Administered 2019-01-12: 81 mg via ORAL
  Filled 2019-01-11: qty 1

## 2019-01-11 MED ORDER — ASPIRIN 81 MG PO CHEW
81.0000 mg | CHEWABLE_TABLET | Freq: Once | ORAL | Status: AC
  Administered 2019-01-11: 81 mg via ORAL
  Filled 2019-01-11: qty 1

## 2019-01-11 MED ORDER — SODIUM CHLORIDE 0.9 % IV SOLN
INTRAVENOUS | Status: DC
  Administered 2019-01-12: 06:00:00 via INTRAVENOUS

## 2019-01-11 MED ORDER — GABAPENTIN 300 MG PO CAPS
600.0000 mg | ORAL_CAPSULE | Freq: Once | ORAL | Status: AC
  Administered 2019-01-11: 23:00:00 600 mg via ORAL
  Filled 2019-01-11: qty 2

## 2019-01-11 MED ORDER — GUAIFENESIN-DM 100-10 MG/5ML PO SYRP
15.0000 mL | ORAL_SOLUTION | ORAL | Status: DC | PRN
  Administered 2019-01-11: 04:00:00 15 mL via ORAL
  Filled 2019-01-11: qty 15

## 2019-01-11 MED FILL — Lidocaine HCl Local Preservative Free (PF) Inj 1%: INTRAMUSCULAR | Qty: 30 | Status: AC

## 2019-01-11 MED FILL — Verapamil HCl IV Soln 2.5 MG/ML: INTRAVENOUS | Qty: 2 | Status: AC

## 2019-01-11 NOTE — H&P (View-Only) (Signed)
Progress Note  Patient Name: Chris Powell Date of Encounter: 01/11/2019  Primary Cardiologist: Kristeen Miss, MD   Subjective   Feels well today. Denies any chest pain or SOB. Wants to go home.  Inpatient Medications    Scheduled Meds: . amLODipine  10 mg Oral Daily  . atorvastatin  80 mg Oral q1800  . budesonide (PULMICORT) nebulizer solution  0.25 mg Nebulization BID  . carvedilol  25 mg Oral BID WC  . cloNIDine  0.1 mg Oral BID  . clopidogrel  75 mg Oral Daily  . enoxaparin (LOVENOX) injection  40 mg Subcutaneous Q24H  . hydrALAZINE  100 mg Oral Q8H  . insulin aspart  0-15 Units Subcutaneous TID AC & HS  . isosorbide dinitrate  20 mg Oral TID  . mouth rinse  15 mL Mouth Rinse BID  . pantoprazole  40 mg Oral QHS  . sodium chloride flush  3 mL Intravenous Q12H   Continuous Infusions: . sodium chloride Stopped (01/07/19 1450)  . sodium chloride     PRN Meds: sodium chloride, sodium chloride, acetaminophen, bisacodyl, docusate, fentaNYL (SUBLIMAZE) injection, guaiFENesin-dextromethorphan, hydrALAZINE, ipratropium-albuterol, labetalol, magnesium hydroxide, menthol-cetylpyridinium, midazolam, ondansetron (ZOFRAN) IV, sodium chloride flush   Vital Signs    Vitals:   01/11/19 0002 01/11/19 0618 01/11/19 0810 01/11/19 0902  BP: 140/84 (!) 137/91  (!) 176/105  Pulse: 80 79  86  Resp:      Temp: 98.9 F (37.2 C) 98.4 F (36.9 C)    TempSrc: Oral Oral    SpO2: 96% 97% 96%   Weight:  (!) 157.7 kg    Height:        Intake/Output Summary (Last 24 hours) at 01/11/2019 1001 Last data filed at 01/10/2019 2045 Gross per 24 hour  Intake 600 ml  Output 525 ml  Net 75 ml   Last 3 Weights 01/11/2019 01/10/2019 01/09/2019  Weight (lbs) 347 lb 11.2 oz 345 lb 344 lb 9.3 oz  Weight (kg) 157.716 kg 156.491 kg 156.3 kg      Telemetry    NSR with PVCs - Personally Reviewed  ECG    none  Physical Exam   GEN: No acute distress.  Morbidly obese Neck: unable to assess JVD  due to size Cardiac: RRR, no murmurs, rubs, or gallops.  Respiratory: Clear to auscultation bilaterally. GI: Soft, nontender, non-distended  MS: No edema; No deformity. Neuro:  Nonfocal  Psych: Normal affect   Labs    Chemistry Recent Labs  Lab 01/06/19 0337  01/08/19 0615  01/08/19 1624 01/09/19 0407 01/11/19 0634  NA 139   < > 141   < > 142  141 141 138  K 3.9   < > 3.4*   < > 4.0  4.1 3.6 3.4*  CL 106   < > 104  --   --  104 103  CO2 20*   < > 25  --   --  27 25  GLUCOSE 206*   < > 106*  --   --  96 116*  BUN 14   < > 31*  --   --  21* 14  CREATININE 1.71*   < > 1.65*  --   --  1.29* 1.26*  CALCIUM 8.2*   < > 8.7*  --   --  8.8* 8.9  PROT 6.4*  --   --   --   --   --   --   ALBUMIN 3.1*  --   --   --   --   --   --  AST 65*  --   --   --   --   --   --   ALT 37  --   --   --   --   --   --   ALKPHOS 83  --   --   --   --   --   --   BILITOT 0.9  --   --   --   --   --   --   GFRNONAA 45*   < > 47*  --   --  >60 >60  GFRAA 52*   < > 55*  --   --  >60 >60  ANIONGAP 13   < > 12  --   --  10 10   < > = values in this interval not displayed.     Hematology Recent Labs  Lab 01/08/19 0615  01/08/19 1624 01/09/19 0407 01/11/19 0634  WBC 11.5*  --   --  9.5 9.6  RBC 4.81  --   --  4.73 4.69  HGB 13.6   < > 14.3  14.3 13.3 13.1  HCT 43.8   < > 42.0  42.0 42.9 41.8  MCV 91.1  --   --  90.7 89.1  MCH 28.3  --   --  28.1 27.9  MCHC 31.1  --   --  31.0 31.3  RDW 16.0*  --   --  15.8* 15.1  PLT 272  --   --  263 242   < > = values in this interval not displayed.    Cardiac Enzymes Recent Labs  Lab 01/06/19 1827 01/07/19 1041 01/07/19 1440 01/07/19 2205  TROPONINI 4.66* 8.13* 6.62* 7.46*    Recent Labs  Lab 01/06/19 0347  TROPIPOC 0.19*     BNP Recent Labs  Lab 01/06/19 0442  BNP 431.5*     DDimer No results for input(s): DDIMER in the last 168 hours.   Radiology    No results found.  Cardiac Studies   Procedures   RIGHT/LEFT HEART CATH  AND CORONARY ANGIOGRAPHY  Conclusion     Non-stenotic Prox LAD-1 lesion was previously treated.  Prox LAD-2 lesion is 50% stenosed.  1st Mrg lesion is 80% stenosed.  Prox RCA to Mid RCA lesion is 99% stenosed.  Mid RCA lesion is 30% stenosed.  Ost 1st Mrg lesion is 90% stenosed.  Ost LAD to Prox LAD lesion is 90% stenosed.  Mid LAD to Dist LAD lesion is 60% stenosed.   Findings:  Ao = 170/102 (123) LV =  170/24 RA =  12 RV =  62/19 PA =   61/26 (40) PCW = 32 with v waves to 40 Fick cardiac output/index = 7.3/2.9 PVR = 1.0 WU FA sat = 94% PA sat = 69%, 69%  Assessment: 1. 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 Volume overload with moderate pulmonary venous HTN  Plan/Discussion:  Given morbid obesity and diffuse distal LAD disease not candidate for CABG. Films reviewed with Dr. Excell Seltzerooper. Plan PCI of RCA and LAD on Tuesday. Will diurese. Watch creatinine closely. Load Plavix.   Arvilla Meresaniel Bensimhon, MD  5:30 PM    Echo: IMPRESSIONS    1. The left ventricle has mild-moderately reduced systolic function, with an ejection fraction of 40-45%. The cavity size was normal. There is mildly increased left ventricular wall  thickness. Left ventricular diastolic parameters were normal. Left  ventricular diffuse hypokinesis.  2. The right ventricle has normal systolic function. The cavity was normal. There is no increase in right ventricular wall thickness.  3. Left atrial size was mildly dilated.  4. The aortic valve is tricuspid.  5. The inferior vena cava was dilated in size with >50% respiratory variability.   Patient Profile     53 y.o. male with a hx of CADs/p NSTEMI 2016 treated with DES to LAD, HTN, OSA on CPAP, DM,COPDand ongoing tobacco smokingwho was admitted with acute respiratory failure, NSTEMI and pulmonary edema.  Assessment & Plan     1. Acute hypoxic respiratory failure: - extubated -  due to flash pulmonary edema in setting of HTN crisis/cocaine use - improved with diuresis and BP control  - appears euvolemic  2.Elevated troponi/NSTEMI: -Pt has known CAD with prior LAD stenting in 2016. 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  - Troponin peaked at 8.1 - EKG withchronic lateralT wave abnormalities but no acute ST segment changes  - Plan PCI tomorrow by Dr Excell Seltzer. On Plavix and ASA.   3. HTN crisis: - BP still quite high. - Adjust meds as needed. Lisinopril/aldactone on hold with AKI.  - will split Bidil so we can titrate hydralazine higher. On amlodipine 10 mg daily and Coreg 25 mg bid. Will resume clonidine at 0.1 mg bid. Was on 0.3 mg bid at home.   4. Acute systolic heart failure: -Echocardiogram 01/06/2019 with LVEF of 40-45% with diffuse hypokinesis -LV gram on cath last year with similar LV function  -CXR with acute pulmonary edema- resolved - will need to titrate HF meds as renal function improved.   5. AKI: -Creatinine, 2.15 -> 1.26 today with a baseline of 1.0-1.3 - Likely can resume ACE/ARB/aldactone after cath   6. Cocaine use - discussed need for cessation       For questions or updates, please contact [facility] Please consult [email or web address] for contact info under        Signed, Halia Franey [place], MD  01/11/2019, 10:01 AM

## 2019-01-11 NOTE — Progress Notes (Signed)
Nutrition Follow-up  RD working remotely.  DOCUMENTATION CODES:   Morbid obesity  INTERVENTION:   - Encourage good PO intake   NUTRITION DIAGNOSIS:   Inadequate oral intake related to inability to eat as evidenced by NPO status.  Resolved - diet advanced to Heart Healthy 4/17  GOAL:   Provide needs based on ASPEN/SCCM guidelines  Meeting 100%  MONITOR:   Vent status, TF tolerance, Labs, Skin  REASON FOR ASSESSMENT:   Ventilator, Consult Enteral/tube feeding initiation and management  ASSESSMENT:   53 yo male with PMH of HTN, COPD, CAD, HLD, borderline DM who was admitted with SOB r/t cocaine use with pulmonary edema. COVID negative. Intubated on admission.  4/16 pt self-extubated  RD unable to reach patient by phone. Per chart review, pt eating very well. Wt stable. Will continue to monitor adequacy of intake.  Labs: potassium 3.4 (L), glucose 116 mg/dL, creatinine 8.87 (H) Meds: Lipitor, Novolog, Protonix EC  NUTRITION - FOCUSED PHYSICAL EXAM: Deferred - RD working remotely  Diet Order:  No known food allergies 4/15 NPO 4/17 Heart Diet Order            Diet NPO time specified Except for: Sips with Meds  Diet effective midnight        Diet Heart Room service appropriate? Yes; Fluid consistency: Thin  Diet effective now            PO Intake 100% x last 8 meals recorded  EDUCATION NEEDS:  No education needs have been identified at this time  Skin:  Skin Assessment: Reviewed RN Assessment  Last BM:  None documented since admission  Height:  Ht Readings from Last 1 Encounters:  01/06/19 5\' 8"  (1.727 m)    Weight:  Wt Readings from Last 10 Encounters:  01/11/19 (!) 157.9 kg  11/13/18 (!) 155.1 kg  10/06/18 (!) 153 kg  07/28/18 (!) 152.9 kg  04/20/18 (!) 160.1 kg  04/07/18 (!) 160.9 kg  01/06/18 (!) 153.3 kg  12/16/17 (!) 149.7 kg  12/03/17 (!) 153.4 kg  11/20/17 (!) 152.1 kg  Wt stable  Net -1.1 L fluid since admit Noted BLE mild  pitting edema  Ideal Body Weight:  70 kg  BMI:  Body mass index is 52.91 kg/m. obese class 3  Estimated Nutritional Needs:   Kcal:  1750-2100 (25-30 kcal/kg IBW)  Protein:  84-105 gm (1.2-1.5 g/kg IBW)  Fluid:  1 mL/kcal or per MD  Jolaine Artist, MS, RDN, LDN Pager: [phone] Available Mondays and Fridays, 9am-2pm

## 2019-01-11 NOTE — Progress Notes (Signed)
Progress Note  Patient Name: Chris Powell Date of Encounter: 01/11/2019  Primary Cardiologist: Kristeen Miss, MD   Subjective   Feels well today. Denies any chest pain or SOB. Wants to go home.  Inpatient Medications    Scheduled Meds: . amLODipine  10 mg Oral Daily  . atorvastatin  80 mg Oral q1800  . budesonide (PULMICORT) nebulizer solution  0.25 mg Nebulization BID  . carvedilol  25 mg Oral BID WC  . cloNIDine  0.1 mg Oral BID  . clopidogrel  75 mg Oral Daily  . enoxaparin (LOVENOX) injection  40 mg Subcutaneous Q24H  . hydrALAZINE  100 mg Oral Q8H  . insulin aspart  0-15 Units Subcutaneous TID AC & HS  . isosorbide dinitrate  20 mg Oral TID  . mouth rinse  15 mL Mouth Rinse BID  . pantoprazole  40 mg Oral QHS  . sodium chloride flush  3 mL Intravenous Q12H   Continuous Infusions: . sodium chloride Stopped (01/07/19 1450)  . sodium chloride     PRN Meds: sodium chloride, sodium chloride, acetaminophen, bisacodyl, docusate, fentaNYL (SUBLIMAZE) injection, guaiFENesin-dextromethorphan, hydrALAZINE, ipratropium-albuterol, labetalol, magnesium hydroxide, menthol-cetylpyridinium, midazolam, ondansetron (ZOFRAN) IV, sodium chloride flush   Vital Signs    Vitals:   01/11/19 0002 01/11/19 0618 01/11/19 0810 01/11/19 0902  BP: 140/84 (!) 137/91  (!) 176/105  Pulse: 80 79  86  Resp:      Temp: 98.9 F (37.2 C) 98.4 F (36.9 C)    TempSrc: Oral Oral    SpO2: 96% 97% 96%   Weight:  (!) 157.7 kg    Height:        Intake/Output Summary (Last 24 hours) at 01/11/2019 1001 Last data filed at 01/10/2019 2045 Gross per 24 hour  Intake 600 ml  Output 525 ml  Net 75 ml   Last 3 Weights 01/11/2019 01/10/2019 01/09/2019  Weight (lbs) 347 lb 11.2 oz 345 lb 344 lb 9.3 oz  Weight (kg) 157.716 kg 156.491 kg 156.3 kg      Telemetry    NSR with PVCs - Personally Reviewed  ECG    none  Physical Exam   GEN: No acute distress.  Morbidly obese Neck: unable to assess JVD  due to size Cardiac: RRR, no murmurs, rubs, or gallops.  Respiratory: Clear to auscultation bilaterally. GI: Soft, nontender, non-distended  MS: No edema; No deformity. Neuro:  Nonfocal  Psych: Normal affect   Labs    Chemistry Recent Labs  Lab 01/06/19 0337  01/08/19 0615  01/08/19 1624 01/09/19 0407 01/11/19 0634  NA 139   < > 141   < > 142  141 141 138  K 3.9   < > 3.4*   < > 4.0  4.1 3.6 3.4*  CL 106   < > 104  --   --  104 103  CO2 20*   < > 25  --   --  27 25  GLUCOSE 206*   < > 106*  --   --  96 116*  BUN 14   < > 31*  --   --  21* 14  CREATININE 1.71*   < > 1.65*  --   --  1.29* 1.26*  CALCIUM 8.2*   < > 8.7*  --   --  8.8* 8.9  PROT 6.4*  --   --   --   --   --   --   ALBUMIN 3.1*  --   --   --   --   --   --  AST 65*  --   --   --   --   --   --   ALT 37  --   --   --   --   --   --   ALKPHOS 83  --   --   --   --   --   --   BILITOT 0.9  --   --   --   --   --   --   GFRNONAA 45*   < > 47*  --   --  >60 >60  GFRAA 52*   < > 55*  --   --  >60 >60  ANIONGAP 13   < > 12  --   --  10 10   < > = values in this interval not displayed.     Hematology Recent Labs  Lab 01/08/19 0615  01/08/19 1624 01/09/19 0407 01/11/19 0634  WBC 11.5*  --   --  9.5 9.6  RBC 4.81  --   --  4.73 4.69  HGB 13.6   < > 14.3  14.3 13.3 13.1  HCT 43.8   < > 42.0  42.0 42.9 41.8  MCV 91.1  --   --  90.7 89.1  MCH 28.3  --   --  28.1 27.9  MCHC 31.1  --   --  31.0 31.3  RDW 16.0*  --   --  15.8* 15.1  PLT 272  --   --  263 242   < > = values in this interval not displayed.    Cardiac Enzymes Recent Labs  Lab 01/06/19 1827 01/07/19 1041 01/07/19 1440 01/07/19 2205  TROPONINI 4.66* 8.13* 6.62* 7.46*    Recent Labs  Lab 01/06/19 0347  TROPIPOC 0.19*     BNP Recent Labs  Lab 01/06/19 0442  BNP 431.5*     DDimer No results for input(s): DDIMER in the last 168 hours.   Radiology    No results found.  Cardiac Studies   Procedures   RIGHT/LEFT HEART CATH  AND CORONARY ANGIOGRAPHY  Conclusion     Non-stenotic Prox LAD-1 lesion was previously treated.  Prox LAD-2 lesion is 50% stenosed.  1st Mrg lesion is 80% stenosed.  Prox RCA to Mid RCA lesion is 99% stenosed.  Mid RCA lesion is 30% stenosed.  Ost 1st Mrg lesion is 90% stenosed.  Ost LAD to Prox LAD lesion is 90% stenosed.  Mid LAD to Dist LAD lesion is 60% stenosed.   Findings:  Ao = 170/102 (123) LV =  170/24 RA =  12 RV =  62/19 PA =   61/26 (40) PCW = 32 with v waves to 40 Fick cardiac output/index = 7.3/2.9 PVR = 1.0 WU FA sat = 94% PA sat = 69%, 69%  Assessment: 1. 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 Volume overload with moderate pulmonary venous HTN  Plan/Discussion:  Given morbid obesity and diffuse distal LAD disease not candidate for CABG. Films reviewed with Dr. Excell Seltzerooper. Plan PCI of RCA and LAD on Tuesday. Will diurese. Watch creatinine closely. Load Plavix.   Arvilla Meresaniel Bensimhon, MD  5:30 PM    Echo: IMPRESSIONS    1. The left ventricle has mild-moderately reduced systolic function, with an ejection fraction of 40-45%. The cavity size was normal. There is mildly increased left ventricular wall  thickness. Left ventricular diastolic parameters were normal. Left  ventricular diffuse hypokinesis.  2. The right ventricle has normal systolic function. The cavity was normal. There is no increase in right ventricular wall thickness.  3. Left atrial size was mildly dilated.  4. The aortic valve is tricuspid.  5. The inferior vena cava was dilated in size with >50% respiratory variability.   Patient Profile     53 y.o. male with a hx of CADs/p NSTEMI 2016 treated with DES to LAD, HTN, OSA on CPAP, DM,COPDand ongoing tobacco smokingwho was admitted with acute respiratory failure, NSTEMI and pulmonary edema.  Assessment & Plan     1. Acute hypoxic respiratory failure: - extubated -  due to flash pulmonary edema in setting of HTN crisis/cocaine use - improved with diuresis and BP control  - appears euvolemic  2.Elevated troponi/NSTEMI: -Pt has known CAD with prior LAD stenting in 2016. 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  - Troponin peaked at 8.1 - EKG withchronic lateralT wave abnormalities but no acute ST segment changes  - Plan PCI tomorrow by Dr Cooper. On Plavix and ASA.   3. HTN crisis: - BP still quite high. - Adjust meds as needed. Lisinopril/aldactone on hold with AKI.  - will split Bidil so we can titrate hydralazine higher. On amlodipine 10 mg daily and Coreg 25 mg bid. Will resume clonidine at 0.1 mg bid. Was on 0.3 mg bid at home.   4. Acute systolic heart failure: -Echocardiogram 01/06/2019 with LVEF of 40-45% with diffuse hypokinesis -LV gram on cath last year with similar LV function  -CXR with acute pulmonary edema- resolved - will need to titrate HF meds as renal function improved.   5. AKI: -Creatinine, 2.15 -> 1.26 today with a baseline of 1.0-1.3 - Likely can resume ACE/ARB/aldactone after cath   6. Cocaine use - discussed need for cessation       For questions or updates, please contact [facility] Please consult [email or web address] for contact info under        Signed, Peter Jordan, MD  01/11/2019, 10:01 AM    

## 2019-01-11 NOTE — Progress Notes (Signed)
PROGRESS NOTE  Chris HugerMaurice D Powell ZOX:[id number] DOB: 10/29/1965 DOA: 01/06/2019 PCP: Raliegh IpGottschalk, Ashly M, DO  HPI/Recap of past 5024 hours: 53 year old male with past medical history of morbid obesity and elevated hypertension found unresponsive by EMS and brought in on 4/15.  According to family, he became acutely short of breath and then started having agonal breathing patterns.  Upon arrival, abnormal EKG but no evidence of ST elevation.  Positive troponin.  Respiratory panel negative and COVID-19 ruled out.  Intubated.  Noted to have diffuse pulmonary edema and vascular congestion.  Also noted to have urine drug screen positive for cocaine.  Echocardiogram elevated to 40-45% compared to normal echo in 2016.  Patient self extubated on night of 4/17 and has continued to be stable on 3 to 4 L nasal cannula.  Has been diuresing side.  Cardiology took patient for right and left heart catheterization on 4/17 was found to have severe CAD with high-grade lesions for after the proximal stent and eve EF of 30-35% along with volume overload with pulmonary venous hypertension.  Given his morbid obesity and diffuse distal LAD disease, is not felt to be a candidate for CABG.  Cardiology plans take patient for PCI of RCA and LAD on Tuesday, 4/21.  Meantime, he continues to be diuresed.  No events overnight.  Patient tolerating CPAP.  Says he tries to be compliant with his CPAP and his medications, but not always.  Denies any current chest pain.    Assessment/Plan: Principal Problem:   Acute hypercapnic and hypoxic respiratory failure (HCC) secondary to acute systolic heart failure and obstructive sleep apnea: Continue diuresis.  Echocardiogram done 4/16 noting decreased ejection fraction as above. Active Problems:   Morbid obesity-BMI 51   COPD with exacerbation (HCC)   Type 2 diabetes mellitus with renal complication (HCC): CBG stable   Acute pulmonary edema (HCC)   AKI (acute kidney injury) (HCC): Creatinine  peaked as high as 2.15 on 4/16.  Has been trending downward ever since.  Today at 1.26. Hypertensive urgency: Should continue to improve with diuresis.  Cardiology adjusting medications accordingly Elevated troponin and non-STEMI: Status post cardiac cath with severe two-vessel disease.  Plan for PCI tomorrow.  Continue aspirin and Plavix plus Lovenox   Code Status: Full code  Family Communication: Declined for me to call family  Disposition Plan: Potential discharge Wednesday after PCI   Consultants:  Cardiology  Procedures:  Intubated 4/15 until 4/17  Status post cardiac cath right and left heart done 4/18  Echocardiogram done 4/16  Planned PCI for 4/21  Antimicrobials:  None  DVT prophylaxis: Lovenox   Objective: Vitals:   01/11/19 1154 01/11/19 1513  BP: (!) 146/82 (!) 148/113  Pulse: 74   Resp: (!) 21   Temp: 98.3 F (36.8 C)   SpO2: 95%     Intake/Output Summary (Last 24 hours) at 01/11/2019 1543 Last data filed at 01/10/2019 2045 Gross per 24 hour  Intake 240 ml  Output 100 ml  Net 140 ml   Filed Weights   01/10/19 0407 01/11/19 0618 01/11/19 1124  Weight: (!) 156.5 kg (!) 157.7 kg (!) 157.9 kg   Body mass index is 52.91 kg/m.  Exam:   General: Morbidly obese, alert and oriented x3  HEENT: Normocephalic and atraumatic, mucous membranes are moist  Neck: Thick, narrow airway  Cardiovascular: Regular rate and rhythm, S1-S2, 2 out of 6 systolic ejection murmur  Respiratory: Decreased breath sounds throughout secondary to body habitus  Abdomen: Soft, obese,  nontender, positive bowel sounds  Musculoskeletal: No clubbing or cyanosis, trace pitting edema  Skin: No skin breaks, tears or lesions  Neuro: No focal deficits  Psychiatry: Appropriate, no evidence of psychoses   Data Reviewed: CBC: Recent Labs  Lab 01/06/19 0824  01/07/19 0318 01/08/19 0615 01/08/19 1613 01/08/19 1624 01/09/19 0407 01/11/19 0634  WBC 11.1*  --  13.6*  11.5*  --   --  9.5 9.6  NEUTROABS 10.1*  --   --  8.6*  --   --   --   --   HGB 14.1   < > 13.8 13.6 14.3 14.3   14.3 13.3 13.1  HCT 44.8   < > 43.6 43.8 42.0 42.0   42.0 42.9 41.8  MCV 89.2  --  89.5 91.1  --   --  90.7 89.1  PLT 299  --  243 272  --   --  263 242   < > = values in this interval not displayed.   Basic Metabolic Panel: Recent Labs  Lab 01/06/19 0337  01/06/19 0442  01/07/19 0318 01/07/19 1041 01/08/19 0615 01/08/19 1613 01/08/19 1624 01/09/19 0407 01/11/19 0634  NA 139   < >  --    < > 140  --  141 141 142   141 141 138  K 3.9   < >  --    < > 3.8  --  3.4* 4.2 4.0   4.1 3.6 3.4*  CL 106  --   --   --  105  --  104  --   --  104 103  CO2 20*  --   --   --  24  --  25  --   --  27 25  GLUCOSE 206*  --   --   --  134*  --  106*  --   --  96 116*  BUN 14  --   --   --  21*  --  31*  --   --  21* 14  CREATININE 1.71*  --   --   --  2.15*  --  1.65*  --   --  1.29* 1.26*  CALCIUM 8.2*  --   --   --  8.8*  --  8.7*  --   --  8.8* 8.9  MG  --   --  2.6*  --   --  2.4 2.3  --   --  2.2  --   PHOS  --   --  6.3*  --   --   --   --   --   --   --   --    < > = values in this interval not displayed.   GFR: Estimated Creatinine Clearance: 101.1 mL/min (A) (by C-G formula based on SCr of 1.26 mg/dL (H)). Liver Function Tests: Recent Labs  Lab 01/06/19 0337  AST 65*  ALT 37  ALKPHOS 83  BILITOT 0.9  PROT 6.4*  ALBUMIN 3.1*   No results for input(s): LIPASE, AMYLASE in the last 168 hours. No results for input(s): AMMONIA in the last 168 hours. Coagulation Profile: No results for input(s): INR, PROTIME in the last 168 hours. Cardiac Enzymes: Recent Labs  Lab 01/06/19 1251 01/06/19 1827 01/07/19 1041 01/07/19 1440 01/07/19 2205  TROPONINI 1.92* 4.66* 8.13* 6.62* 7.46*   BNP (last 3 results) No results for input(s): PROBNP in the last 8760 hours. HbA1C: No results for input(s): HGBA1C  in the last 72 hours. CBG: Recent Labs  Lab 01/10/19 1136  01/10/19 1606 01/10/19 2113 01/11/19 0812 01/11/19 1153  GLUCAP 126* 167* 107* 99 130*   Lipid Profile: No results for input(s): CHOL, HDL, LDLCALC, TRIG, CHOLHDL, LDLDIRECT in the last 72 hours. Thyroid Function Tests: No results for input(s): TSH, T4TOTAL, FREET4, T3FREE, THYROIDAB in the last 72 hours. Anemia Panel: No results for input(s): VITAMINB12, FOLATE, FERRITIN, TIBC, IRON, RETICCTPCT in the last 72 hours. Urine analysis:    Component Value Date/Time   COLORURINE AMBER (A) 01/06/2019 0405   APPEARANCEUR CLOUDY (A) 01/06/2019 0405   APPEARANCEUR Clear 09/11/2017 1546   LABSPEC 1.023 01/06/2019 0405   PHURINE 5.0 01/06/2019 0405   GLUCOSEU 150 (A) 01/06/2019 0405   HGBUR MODERATE (A) 01/06/2019 0405   BILIRUBINUR NEGATIVE 01/06/2019 0405   BILIRUBINUR Negative 09/11/2017 1546   KETONESUR NEGATIVE 01/06/2019 0405   PROTEINUR 100 (A) 01/06/2019 0405   NITRITE NEGATIVE 01/06/2019 0405   LEUKOCYTESUR NEGATIVE 01/06/2019 0405   Sepsis Labs: (procalcitonin:4,lacticidven:4)  ) Recent Results (from the past 240 hour(s))  Blood culture (routine x 2)     Status: None   Collection Time: 01/06/19  3:37 AM  Result Value Ref Range Status   Specimen Description BLOOD RIGHT HAND  Final   Special Requests   Final    BOTTLES DRAWN AEROBIC ONLY Blood Culture results may not be optimal due to an inadequate volume of blood received in culture bottles   Culture   Final    NO GROWTH 5 DAYS Performed at [facility], [address] [place], [place] [address]    Report Status 01/11/2019 FINAL  Final  Blood culture (routine x 2)     Status: None   Collection Time: 01/06/19  3:41 AM  Result Value Ref Range Status   Specimen Description BLOOD RIGHT HAND  Final   Special Requests   Final    BOTTLES DRAWN AEROBIC AND ANAEROBIC Blood Culture adequate volume   Culture   Final    NO GROWTH 5 DAYS Performed at [facility], [address], [place], [place]  [address]    Report Status 01/11/2019 FINAL  Final  SARS Coronavirus 2 [facility] order, Performed in [facility] hospital lab)     Status: None   Collection Time: 01/06/19  3:44 AM  Result Value Ref Range Status   SARS Coronavirus 2 NEGATIVE NEGATIVE Final    Comment: (NOTE) If result is NEGATIVE SARS-CoV-2 target nucleic acids are NOT DETECTED. The SARS-CoV-2 RNA is generally detectable in upper and lower  respiratory specimens during the acute phase of infection. The lowest  concentration of SARS-CoV-2 viral copies this assay can detect is 250  copies / mL. A negative result does not preclude SARS-CoV-2 infection  and should not be used as the sole basis for treatment or other  patient management decisions.  A negative result may occur with  improper specimen collection / handling, submission of specimen other  than nasopharyngeal swab, presence of viral mutation(s) within the  areas targeted by this assay, and inadequate number of viral copies  (<250 copies / mL). A negative result must be combined with clinical  observations, patient history, and epidemiological information. If result is POSITIVE SARS-CoV-2 target nucleic acids are DETECTED. The SARS-CoV-2 RNA is generally detectable in upper and lower  respiratory specimens dur ing the acute phase of infection.  Positive  results are indicative of active infection with SARS-CoV-2.  Clinical  correlation with patient history and other diagnostic information is  necessary to determine patient infection status.  Positive results do  not rule out bacterial infection or co-infection with other viruses. If result is PRESUMPTIVE POSTIVE SARS-CoV-2 nucleic acids MAY BE PRESENT.   A presumptive positive result was obtained on the submitted specimen  and confirmed on repeat testing.  While 2019 novel coronavirus  (SARS-CoV-2) nucleic acids may be present in the submitted sample  additional confirmatory testing may be necessary for  epidemiological  and / or clinical management purposes  to differentiate between  SARS-CoV-2 and other Sarbecovirus currently known to infect humans.  If clinically indicated additional testing with an alternate test  methodology [phone]) is advised. The SARS-CoV-2 RNA is generally  detectable in upper and lower respiratory sp ecimens during the acute  phase of infection. The expected result is Negative. Fact Sheet for Patients:  BoilerBrush.com.cy Fact Sheet for Healthcare Providers: https://pope.com/ This test is not yet approved or cleared by the [place] FDA and has been authorized for detection and/or diagnosis of SARS-CoV-2 by FDA under an Emergency Use Authorization (EUA).  This EUA will remain in effect (meaning this test can be used) for the duration of the COVID-19 declaration under Section 564(b)(1) of the Act, 21 U.S.C. section 360bbb-3(b)(1), unless the authorization is terminated or revoked sooner. Performed at [facility], [address], [place], [place] [address]   Urine culture     Status: None   Collection Time: 01/06/19  4:05 AM  Result Value Ref Range Status   Specimen Description URINE, RANDOM  Final   Special Requests NONE  Final   Culture   Final    NO GROWTH Performed at [facility], [address] [place], [place] [address]    Report Status 01/07/2019 FINAL  Final  Respiratory Panel by PCR     Status: None   Collection Time: 01/06/19  5:57 AM  Result Value Ref Range Status   Adenovirus NOT DETECTED NOT DETECTED Final   Coronavirus 229E NOT DETECTED NOT DETECTED Final    Comment: (NOTE) The Coronavirus on the Respiratory Panel, DOES NOT test for the novel  Coronavirus (2019 nCoV)    Coronavirus HKU1 NOT DETECTED NOT DETECTED Final   Coronavirus NL63 NOT DETECTED NOT DETECTED Final   Coronavirus OC43 NOT DETECTED NOT DETECTED Final   Metapneumovirus NOT DETECTED NOT DETECTED Final    Rhinovirus / Enterovirus NOT DETECTED NOT DETECTED Final   Influenza A NOT DETECTED NOT DETECTED Final   Influenza B NOT DETECTED NOT DETECTED Final   Parainfluenza Virus 1 NOT DETECTED NOT DETECTED Final   Parainfluenza Virus 2 NOT DETECTED NOT DETECTED Final   Parainfluenza Virus 3 NOT DETECTED NOT DETECTED Final   Parainfluenza Virus 4 NOT DETECTED NOT DETECTED Final   Respiratory Syncytial Virus NOT DETECTED NOT DETECTED Final   Bordetella pertussis NOT DETECTED NOT DETECTED Final   Chlamydophila pneumoniae NOT DETECTED NOT DETECTED Final   Mycoplasma pneumoniae NOT DETECTED NOT DETECTED Final    Comment: Performed at [facility], [address], [place], [place] [address]  MRSA PCR Screening     Status: Abnormal   Collection Time: 01/06/19  5:57 AM  Result Value Ref Range Status   MRSA by PCR POSITIVE (A) NEGATIVE Final    Comment:        The GeneXpert MRSA Assay (FDA approved for NASAL specimens only), is one component of a comprehensive MRSA colonization surveillance program.  It is not intended to diagnose MRSA infection nor to guide or monitor treatment for MRSA infections. RESULT CALLED TO, READ BACK BY AND VERIFIED WITH: RN S WARD [phone] MLM Performed at [facility], [address], [place], [place] [address]   Expectorated sputum assessment w rflx to resp cult     Status: None   Collection Time: 01/07/19  6:18 PM  Result Value Ref Range Status   Specimen Description EXPECTORATED SPUTUM  Final   Special Requests NONE  Final   Sputum evaluation   Final    Sputum specimen not acceptable for testing.  Please recollect.   RESULT CALLED TO, READ BACK BY AND VERIFIED WITH: Catalina Pizza RN 01/07/19 1917 JDW Performed at [facility], [address], [place], [place] [address]    Report Status 01/07/2019 FINAL  Final  Expectorated sputum assessment w rflx to resp cult     Status: None   Collection Time: 01/07/19  9:19 PM  Result Value Ref Range  Status   Specimen Description EXPECTORATED SPUTUM  Final   Special Requests NONE  Final   Sputum evaluation   Final    THIS SPECIMEN IS ACCEPTABLE FOR SPUTUM CULTURE Performed at [facility], [address], [place], [place] [address]    Report Status 01/08/2019 FINAL  Final  Culture, respiratory     Status: None   Collection Time: 01/07/19  9:19 PM  Result Value Ref Range Status   Specimen Description EXPECTORATED SPUTUM  Final   Special Requests NONE Reflexed from [id number]  Final   Gram Stain   Final    MODERATE WBC PRESENT, PREDOMINANTLY PMN MODERATE GRAM NEGATIVE RODS MODERATE GRAM POSITIVE COCCI    Culture   Final    FEW Consistent with normal respiratory flora. Performed at [facility], [address], [place], [place] [address]    Report Status 01/10/2019 FINAL  Final      Studies: No results found.  Scheduled Meds:  amLODipine  10 mg Oral Daily   [START ON 01/12/2019] aspirin  81 mg Oral Pre-Cath   atorvastatin  80 mg Oral q1800   budesonide (PULMICORT) nebulizer solution  0.25 mg Nebulization BID   carvedilol  25 mg Oral BID WC   cloNIDine  0.1 mg Oral BID   clopidogrel  75 mg Oral Daily   enoxaparin (LOVENOX) injection  40 mg Subcutaneous Q24H   hydrALAZINE  100 mg Oral Q8H   insulin aspart  0-15 Units Subcutaneous TID AC & HS   isosorbide dinitrate  20 mg Oral TID   mouth rinse  15 mL Mouth Rinse BID   pantoprazole  40 mg Oral QHS   sodium chloride flush  3 mL Intravenous Q12H   sodium chloride flush  3 mL Intravenous Q12H    Continuous Infusions:  sodium chloride Stopped (01/07/19 1450)   sodium chloride     sodium chloride     [START ON 01/12/2019] sodium chloride       LOS: 5 days     Hollice Espy, MD [facility]  To reach me or the doctor on call, go to: [email or web address] Password [facility]  01/11/2019, 3:43 PM

## 2019-01-12 ENCOUNTER — Encounter (HOSPITAL_COMMUNITY)
Admission: EM | Disposition: A | Discharge status: Home / Self Care | Payer: Self-pay | Source: Home / Self Care | Attending: Internal Medicine

## 2019-01-12 ENCOUNTER — Other Ambulatory Visit: Payer: Self-pay

## 2019-01-12 DIAGNOSIS — I214 Non-ST elevation (NSTEMI) myocardial infarction: Secondary | ICD-10-CM

## 2019-01-12 HISTORY — PX: CORONARY STENT INTERVENTION: CATH118234

## 2019-01-12 HISTORY — PX: LEFT HEART CATH: CATH118248

## 2019-01-12 LAB — BASIC METABOLIC PANEL
Anion gap: 9 (ref 5–15)
BUN: 20 mg/dL (ref 6–20)
CO2: 24 mmol/L (ref 22–32)
Calcium: 8.9 mg/dL (ref 8.9–10.3)
Chloride: 104 mmol/L (ref 98–111)
Creatinine, Ser: 1.13 mg/dL (ref 0.61–1.24)
GFR calc Af Amer: >60 mL/min (ref >60)
GFR calc non Af Amer: >60 mL/min (ref >60)
Glucose, Bld: 113 mg/dL — ABNORMAL HIGH (ref 70–99)
Potassium: 3.7 mmol/L (ref 3.5–5.1)
Sodium: 137 mmol/L (ref 135–145)

## 2019-01-12 LAB — CBC
HCT: 40.2 % (ref 39.0–52.0)
Hemoglobin: 12.7 g/dL — ABNORMAL LOW (ref 13.0–17.0)
MCH: 28.3 pg (ref 26.0–34.0)
MCHC: 31.6 g/dL (ref 30.0–36.0)
MCV: 89.7 fL (ref 80.0–100.0)
Platelets: 277 10*3/uL (ref 150–400)
RBC: 4.48 MIL/uL (ref 4.22–5.81)
RDW: 15.2 % (ref 11.5–15.5)
WBC: 9.1 10*3/uL (ref 4.0–10.5)
nRBC: 0 % (ref 0.0–0.2)

## 2019-01-12 LAB — POCT ACTIVATED CLOTTING TIME: Activated Clotting Time: 362 seconds

## 2019-01-12 LAB — GLUCOSE, CAPILLARY
Glucose-Capillary: 83 mg/dL (ref 70–99)
Glucose-Capillary: 110 mg/dL — ABNORMAL HIGH (ref 70–99)
Glucose-Capillary: 123 mg/dL — ABNORMAL HIGH (ref 70–99)

## 2019-01-12 SURGERY — CORONARY STENT INTERVENTION
Anesthesia: LOCAL

## 2019-01-12 MED ORDER — ENOXAPARIN SODIUM 40 MG/0.4ML ~~LOC~~ SOLN: 40.0000 mg | SUBCUTANEOUS | Status: DC

## 2019-01-12 MED ORDER — HEPARIN (PORCINE) IN NACL 1000-0.9 UT/500ML-% IV SOLN
INTRAVENOUS | Status: DC | PRN
  Administered 2019-01-12 (×2): 500 mL

## 2019-01-12 MED ORDER — FUROSEMIDE 40 MG PO TABS
40.0000 mg | ORAL_TABLET | Freq: Every day | ORAL | Status: DC
  Administered 2019-01-12 – 2019-01-13 (×2): 40 mg via ORAL
  Filled 2019-01-12 (×2): qty 1

## 2019-01-12 MED ORDER — SODIUM CHLORIDE 0.9 % IV SOLN: 250.0000 mL | INTRAVENOUS | Status: DC | PRN

## 2019-01-12 MED ORDER — BIVALIRUDIN BOLUS VIA INFUSION - CUPID
INTRAVENOUS | Status: DC | PRN
  Administered 2019-01-12: 118.425 mg via INTRAVENOUS

## 2019-01-12 MED ORDER — LABETALOL HCL 5 MG/ML IV SOLN
INTRAVENOUS | Status: DC | PRN
  Administered 2019-01-12: 10 mg via INTRAVENOUS

## 2019-01-12 MED ORDER — SODIUM CHLORIDE 0.9% FLUSH
3.0000 mL | Freq: Two times a day (BID) | INTRAVENOUS | Status: DC
  Administered 2019-01-12: 23:00:00 3 mL via INTRAVENOUS

## 2019-01-12 MED ORDER — VERAPAMIL HCL 2.5 MG/ML IV SOLN
INTRAVENOUS | Status: AC
  Filled 2019-01-12: qty 2

## 2019-01-12 MED ORDER — FENTANYL CITRATE (PF) 100 MCG/2ML IJ SOLN
INTRAMUSCULAR | Status: AC
  Filled 2019-01-12: qty 2

## 2019-01-12 MED ORDER — MIDAZOLAM HCL 2 MG/2ML IJ SOLN
INTRAMUSCULAR | Status: DC | PRN
  Administered 2019-01-12 (×2): 1 mg via INTRAVENOUS
  Administered 2019-01-12: 2 mg via INTRAVENOUS

## 2019-01-12 MED ORDER — MIDAZOLAM HCL 2 MG/2ML IJ SOLN
INTRAMUSCULAR | Status: AC
  Filled 2019-01-12: qty 2

## 2019-01-12 MED ORDER — BIVALIRUDIN TRIFLUOROACETATE 250 MG IV SOLR
INTRAVENOUS | Status: AC
  Filled 2019-01-12: qty 250

## 2019-01-12 MED ORDER — MORPHINE SULFATE (PF) 2 MG/ML IV SOLN: 2.0000 mg | INTRAVENOUS | Status: DC | PRN

## 2019-01-12 MED ORDER — CLONIDINE HCL 0.2 MG PO TABS
0.2000 mg | ORAL_TABLET | Freq: Two times a day (BID) | ORAL | Status: DC
  Administered 2019-01-12: 10:00:00 0.2 mg via ORAL
  Filled 2019-01-12: qty 1

## 2019-01-12 MED ORDER — NITROGLYCERIN 1 MG/10 ML FOR IR/CATH LAB
INTRA_ARTERIAL | Status: DC | PRN
  Administered 2019-01-12 (×2): 200 ug via INTRACORONARY

## 2019-01-12 MED ORDER — LABETALOL HCL 5 MG/ML IV SOLN
INTRAVENOUS | Status: AC
  Filled 2019-01-12: qty 4

## 2019-01-12 MED ORDER — FENTANYL CITRATE (PF) 100 MCG/2ML IJ SOLN
INTRAMUSCULAR | Status: DC | PRN
  Administered 2019-01-12 (×3): 25 ug via INTRAVENOUS

## 2019-01-12 MED ORDER — IOHEXOL 350 MG/ML SOLN
INTRAVENOUS | Status: DC | PRN
  Administered 2019-01-12: 145 mL via INTRA_ARTERIAL

## 2019-01-12 MED ORDER — NITROGLYCERIN 1 MG/10 ML FOR IR/CATH LAB
INTRA_ARTERIAL | Status: AC
  Filled 2019-01-12: qty 10

## 2019-01-12 MED ORDER — CLONIDINE HCL 0.2 MG PO TABS
0.3000 mg | ORAL_TABLET | Freq: Two times a day (BID) | ORAL | Status: DC
  Administered 2019-01-12 – 2019-01-13 (×2): 0.3 mg via ORAL
  Filled 2019-01-12 (×2): qty 1

## 2019-01-12 MED ORDER — HEPARIN SODIUM (PORCINE) 1000 UNIT/ML IJ SOLN
INTRAMUSCULAR | Status: AC
  Filled 2019-01-12: qty 1

## 2019-01-12 MED ORDER — SODIUM CHLORIDE 0.9 % IV SOLN
INTRAVENOUS | Status: DC | PRN
  Administered 2019-01-12: 09:00:00 1.742 mg/kg/h
  Administered 2019-01-12: 1.75 mg/kg/h via INTRAVENOUS

## 2019-01-12 MED ORDER — LIDOCAINE HCL (PF) 1 % IJ SOLN
INTRAMUSCULAR | Status: DC | PRN
  Administered 2019-01-12: 15 mL

## 2019-01-12 MED ORDER — SODIUM CHLORIDE 0.9% FLUSH: 3.0000 mL | INTRAVENOUS | Status: DC | PRN

## 2019-01-12 MED ORDER — LIDOCAINE HCL (PF) 1 % IJ SOLN
INTRAMUSCULAR | Status: AC
  Filled 2019-01-12: qty 30

## 2019-01-12 MED ORDER — NITROGLYCERIN IN D5W 200-5 MCG/ML-% IV SOLN
INTRAVENOUS | Status: AC | PRN
  Administered 2019-01-12: 10 ug/min via INTRAVENOUS

## 2019-01-12 MED ORDER — HEPARIN (PORCINE) IN NACL 1000-0.9 UT/500ML-% IV SOLN
INTRAVENOUS | Status: AC
  Filled 2019-01-12: qty 1000

## 2019-01-12 MED ORDER — ZOLPIDEM TARTRATE 5 MG PO TABS
5.0000 mg | ORAL_TABLET | Freq: Every evening | ORAL | Status: DC | PRN
  Administered 2019-01-12: 5 mg via ORAL
  Filled 2019-01-12: qty 1

## 2019-01-12 MED ORDER — SPIRONOLACTONE 25 MG PO TABS
50.0000 mg | ORAL_TABLET | Freq: Every day | ORAL | Status: DC
  Administered 2019-01-12 – 2019-01-13 (×2): 50 mg via ORAL
  Filled 2019-01-12 (×2): qty 2

## 2019-01-12 SURGICAL SUPPLY — 22 items
BALLN SAPPHIRE 2.5X15 (BALLOONS) ×2
BALLN SAPPHIRE ~~LOC~~ 3.0X12 (BALLOONS) ×2 IMPLANT
BALLN SAPPHIRE ~~LOC~~ 3.75X12 (BALLOONS) ×2 IMPLANT
BALLOON SAPPHIRE 2.5X15 (BALLOONS) ×1 IMPLANT
CATH LAUNCHER 6FR AL2 (CATHETERS) ×1 IMPLANT
CATHETER LAUNCHER 6FR AL2 (CATHETERS) ×2
DEVICE CLOSURE PERCLS PRGLD 6F (VASCULAR PRODUCTS) ×1 IMPLANT
ELECT DEFIB PAD ADLT CADENCE (PAD) ×2 IMPLANT
HOVERMATT SINGLE USE (MISCELLANEOUS) ×2 IMPLANT
KIT ENCORE 26 ADVANTAGE (KITS) ×2 IMPLANT
KIT HEART LEFT (KITS) ×2 IMPLANT
KIT HEMO VALVE WATCHDOG (MISCELLANEOUS) ×2 IMPLANT
PACK CARDIAC CATHETERIZATION (CUSTOM PROCEDURE TRAY) ×2 IMPLANT
PERCLOSE PROGLIDE 6F (VASCULAR PRODUCTS) ×2
SHEATH PINNACLE 6F 10CM (SHEATH) ×2 IMPLANT
SHEATH PROBE COVER 6X72 (BAG) ×2 IMPLANT
STENT SYNERGY DES 2.75X16 (Permanent Stent) ×2 IMPLANT
STENT SYNERGY DES 3.5X20 (Permanent Stent) ×2 IMPLANT
TRANSDUCER W/STOPCOCK (MISCELLANEOUS) ×2 IMPLANT
TUBING CIL FLEX 10 FLL-RA (TUBING) ×2 IMPLANT
WIRE COUGAR XT STRL 190CM (WIRE) ×2 IMPLANT
WIRE EMERALD 3MM-J .035X150CM (WIRE) ×2 IMPLANT

## 2019-01-12 NOTE — Progress Notes (Signed)
Patient states that he will wear CPAP once he is ready for bed. Inform me that he did not need assistance with the machine. Humidity provided. Education given on the proper use of the machine.

## 2019-01-12 NOTE — Progress Notes (Signed)
PROGRESS NOTE    Chris Powell  ZOX:[id number] DOB: 1966/03/19 DOA: 01/06/2019 PCP: Raliegh Ip, DO   Brief Narrative:  53 year old with history of morbid obesity, elevated blood pressure, COPD, diabetes mellitus type 2 was found unresponsive by EMS.  Apparently he was short of breath and having agonal breathing per family.  Brought to the hospital and was noted to have abnormal EKG.  COVID was negative.  UDS was positive for cocaine.  Underwent catheterization on 4/17 found to have severe coronary artery disease with high-grade lesion.  Echocardiogram showed ejection fraction 30-35%.  He was not amenable for CABG therefore patient had repeat catheterization 4/21 status post stent.   Assessment & Plan:   Principal Problem:   Acute hypercapnic respiratory failure (HCC) Active Problems:   Morbid obesity-BMI 51   COPD with exacerbation (HCC)   Type 2 diabetes mellitus with renal complication (HCC)   Acute pulmonary edema (HCC)   AKI (acute kidney injury) (HCC)   Elevated troponin   Non-ST elevation (NSTEMI) myocardial infarction (HCC)   Elevated troponin with non-STEMI Severe coronary artery disease - Status post cardiac catheterization on 4/17 showing severe CAD but not amenable to CABG therefore repeat cardiac cath 4/21 status post PCI. -Medical management per cardiology team. -I will check lipid panel and A1c - Aspirin, Plavix, statin.  Hypertensive urgency, improved Acute hypoxic respiratory failure, multifactorial Flash pulmonary edema -Secondary to uncontrolled hypertension.  Improved with diuresis.  Currently appears to be euvolemic in nature.  Closely monitor this. - Cardiac medications to be adjusted by their team. -Wean off nitron drip  Acute systolic congestive heart failure, ejection fraction 40 to 45% - Secondary to worsening cardiac disease.  Medication adjustments to be made.  Status post left heart catheterization with PCI-mid LAD and proximal RCA  -Improved with diuresis.  Morbid obesity with BMI greater than 50 Obstructive sleep apnea on CPAP -Will need CPAP at bedtime.  Illicit drug use -Counseled to quit using this.  Diabetes mellitus type 2 - Check hemoglobin A1c.  Sliding scale and Accu-Chek.  DVT prophylaxis: Lovenox Code Status: Full code Family Communication: None at bedside Disposition Plan: To be determined.  Likely can be discharged tomorrow  Consultants:   Cardiology  Procedures:   Left heart catheterization 4/17  Repeat left heart catheterization 4/21  Antimicrobials:   None   Subjective: No complaints besides groin pain after insertion site for left heart catheterization.  Denies any chest pain  Review of Systems Otherwise negative except as per HPI, including: General: Denies fever, chills, night sweats or unintended weight loss. Resp: Denies cough, wheezing, shortness of breath. Cardiac: Denies chest pain, palpitations, orthopnea, paroxysmal nocturnal dyspnea. GI: Denies abdominal pain, nausea, vomiting, diarrhea or constipation GU: Denies dysuria, frequency, hesitancy or incontinence MS: Denies muscle aches, joint pain or swelling Neuro: Denies headache, neurologic deficits (focal weakness, numbness, tingling), abnormal gait Psych: Denies anxiety, depression, SI/HI/AVH Skin: Denies new rashes or lesions ID: Denies sick contacts, exotic exposures, travel  Objective: Vitals:   01/12/19 1115 01/12/19 1130 01/12/19 1200 01/12/19 1204  BP: (!) 147/81 (!) 153/75 (!) 143/79 (!) 143/79  Pulse: 74 77 70   Resp:    20  Temp: (!) 97 F (36.1 C)   97.6 F (36.4 C)  TempSrc: Oral   Oral  SpO2: 100% 99% 96% 96%  Weight:      Height:        Intake/Output Summary (Last 24 hours) at 01/12/2019 1309 Last data filed at 01/12/2019 1207  Gross per 24 hour  Intake 1182 ml  Output 900 ml  Net 282 ml   Filed Weights   01/10/19 0407 01/11/19 0618 01/11/19 1124  Weight: (!) 156.5 kg (!) 157.7 kg (!)  157.9 kg    Examination:  General exam: Appears calm and comfortable, morbidly obese Respiratory system: Clear to auscultation. Respiratory effort normal. Cardiovascular system: S1 & S2 heard, RRR. No JVD, murmurs, rubs, gallops or clicks. No pedal edema. Gastrointestinal system: Abdomen is nondistended, soft and nontender. No organomegaly or masses felt. Normal bowel sounds heard. Central nervous system: Alert and oriented. No focal neurological deficits. Extremities: Symmetric 5 x 5 power. Skin: No rashes, lesions or ulcers Psychiatry: Judgement and insight appear normal. Mood & affect appropriate.     Data Reviewed:   CBC: Recent Labs  Lab 01/06/19 0824  01/07/19 0318 01/08/19 0615 01/08/19 1613 01/08/19 1624 01/09/19 0407 01/11/19 0634 01/12/19 0445  WBC 11.1*  --  13.6* 11.5*  --   --  9.5 9.6 9.1  NEUTROABS 10.1*  --   --  8.6*  --   --   --   --   --   HGB 14.1   < > 13.8 13.6 14.3 14.3  14.3 13.3 13.1 12.7*  HCT 44.8   < > 43.6 43.8 42.0 42.0  42.0 42.9 41.8 40.2  MCV 89.2  --  89.5 91.1  --   --  90.7 89.1 89.7  PLT 299  --  243 272  --   --  263 242 277   < > = values in this interval not displayed.   Basic Metabolic Panel: Recent Labs  Lab 01/06/19 0442  01/07/19 0318 01/07/19 1041 01/08/19 0615 01/08/19 1613 01/08/19 1624 01/09/19 0407 01/11/19 0634 01/12/19 0445  NA  --    < > 140  --  141 141 142  141 141 138 137  K  --    < > 3.8  --  3.4* 4.2 4.0  4.1 3.6 3.4* 3.7  CL  --   --  105  --  104  --   --  104 103 104  CO2  --   --  24  --  25  --   --  GLUCOSE  --   --  134*  --  106*  --   --  96 116* 113*  BUN  --   --  21*  --  31*  --   --  21* 14 20  CREATININE  --   --  2.15*  --  1.65*  --   --  1.29* 1.26* 1.13  CALCIUM  --   --  8.8*  --  8.7*  --   --  8.8* 8.9 8.9  MG 2.6*  --   --  2.4 2.3  --   --  2.2  --   --   PHOS 6.3*  --   --   --   --   --   --   --   --   --    < > = values in this interval not displayed.    GFR: Estimated Creatinine Clearance: 112.7 mL/min (by C-G formula based on SCr of 1.13 mg/dL). Liver Function Tests: Recent Labs  Lab 01/06/19 0337  AST 65*  ALT 37  ALKPHOS 83  BILITOT 0.9  PROT 6.4*  ALBUMIN 3.1*   No results for input(s): LIPASE, AMYLASE in the last 168  hours. No results for input(s): AMMONIA in the last 168 hours. Coagulation Profile: No results for input(s): INR, PROTIME in the last 168 hours. Cardiac Enzymes: Recent Labs  Lab 01/06/19 1251 01/06/19 1827 01/07/19 1041 01/07/19 1440 01/07/19 2205  TROPONINI 1.92* 4.66* 8.13* 6.62* 7.46*   BNP (last 3 results) No results for input(s): PROBNP in the last 8760 hours. HbA1C: No results for input(s): HGBA1C in the last 72 hours. CBG: Recent Labs  Lab 01/11/19 0812 01/11/19 1153 01/11/19 1631 01/11/19 2118 01/12/19 1028  GLUCAP 99 130* 220* 177* 83   Lipid Profile: No results for input(s): CHOL, HDL, LDLCALC, TRIG, CHOLHDL, LDLDIRECT in the last 72 hours. Thyroid Function Tests: No results for input(s): TSH, T4TOTAL, FREET4, T3FREE, THYROIDAB in the last 72 hours. Anemia Panel: No results for input(s): VITAMINB12, FOLATE, FERRITIN, TIBC, IRON, RETICCTPCT in the last 72 hours. Sepsis Labs: Recent Labs  Lab 01/06/19 0442  PROCALCITON <0.10    Recent Results (from the past 240 hour(s))  Blood culture (routine x 2)     Status: None   Collection Time: 01/06/19  3:37 AM  Result Value Ref Range Status   Specimen Description BLOOD RIGHT HAND  Final   Special Requests   Final    BOTTLES DRAWN AEROBIC ONLY Blood Culture results may not be optimal due to an inadequate volume of blood received in culture bottles   Culture   Final    NO GROWTH 5 DAYS Performed at [facility], [address] [place], [place] [address]    Report Status 01/11/2019 FINAL  Final  Blood culture (routine x 2)     Status: None   Collection Time: 01/06/19  3:41 AM  Result Value Ref Range Status   Specimen  Description BLOOD RIGHT HAND  Final   Special Requests   Final    BOTTLES DRAWN AEROBIC AND ANAEROBIC Blood Culture adequate volume   Culture   Final    NO GROWTH 5 DAYS Performed at [facility], [address] [place], [place] [address]    Report Status 01/11/2019 FINAL  Final  SARS Coronavirus 2 [facility](Hospital order, Performed in [facility] hospital lab)     Status: None   Collection Time: 01/06/19  3:44 AM  Result Value Ref Range Status   SARS Coronavirus 2 NEGATIVE NEGATIVE Final    Comment: (NOTE) If result is NEGATIVE SARS-CoV-2 target nucleic acids are NOT DETECTED. The SARS-CoV-2 RNA is generally detectable in upper and lower  respiratory specimens during the acute phase of infection. The lowest  concentration of SARS-CoV-2 viral copies this assay can detect is 250  copies / mL. A negative result does not preclude SARS-CoV-2 infection  and should not be used as the sole basis for treatment or other  patient management decisions.  A negative result may occur with  improper specimen collection / handling, submission of specimen other  than nasopharyngeal swab, presence of viral mutation(s) within the  areas targeted by this assay, and inadequate number of viral copies  (<250 copies / mL). A negative result must be combined with clinical  observations, patient history, and epidemiological information. If result is POSITIVE SARS-CoV-2 target nucleic acids are DETECTED. The SARS-CoV-2 RNA is generally detectable in upper and lower  respiratory specimens dur ing the acute phase of infection.  Positive  results are indicative of active infection with SARS-CoV-2.  Clinical  correlation with patient history and other diagnostic information is  necessary to determine patient infection status.  Positive results do  not rule out bacterial infection or co-infection with other viruses. If result is PRESUMPTIVE POSTIVE SARS-CoV-2 nucleic acids MAY BE PRESENT.   A presumptive  positive result was obtained on the submitted specimen  and confirmed on repeat testing.  While 2019 novel coronavirus  (SARS-CoV-2) nucleic acids may be present in the submitted sample  additional confirmatory testing may be necessary for epidemiological  and / or clinical management purposes  to differentiate between  SARS-CoV-2 and other Sarbecovirus currently known to infect humans.  If clinically indicated additional testing with an alternate test  methodology [phone]) is advised. The SARS-CoV-2 RNA is generally  detectable in upper and lower respiratory sp ecimens during the acute  phase of infection. The expected result is Negative. Fact Sheet for Patients:  BoilerBrush.com.cy Fact Sheet for Healthcare Providers: https://pope.com/ This test is not yet approved or cleared by the [place] FDA and has been authorized for detection and/or diagnosis of SARS-CoV-2 by FDA under an Emergency Use Authorization (EUA).  This EUA will remain in effect (meaning this test can be used) for the duration of the COVID-19 declaration under Section 564(b)(1) of the Act, 21 U.S.C. section 360bbb-3(b)(1), unless the authorization is terminated or revoked sooner. Performed at [facility], [address], [place], [place] [address]   Urine culture     Status: None   Collection Time: 01/06/19  4:05 AM  Result Value Ref Range Status   Specimen Description URINE, RANDOM  Final   Special Requests NONE  Final   Culture   Final    NO GROWTH Performed at [facility], [address], [place], [place] [address]    Report Status 01/07/2019 FINAL  Final  Respiratory Panel by PCR     Status: None   Collection Time: 01/06/19  5:57 AM  Result Value Ref Range Status   Adenovirus NOT DETECTED NOT DETECTED Final   Coronavirus 229E NOT DETECTED NOT DETECTED Final    Comment: (NOTE) The Coronavirus on the Respiratory Panel, DOES NOT test for the  novel  Coronavirus (2019 nCoV)    Coronavirus HKU1 NOT DETECTED NOT DETECTED Final   Coronavirus NL63 NOT DETECTED NOT DETECTED Final   Coronavirus OC43 NOT DETECTED NOT DETECTED Final   Metapneumovirus NOT DETECTED NOT DETECTED Final   Rhinovirus / Enterovirus NOT DETECTED NOT DETECTED Final   Influenza A NOT DETECTED NOT DETECTED Final   Influenza B NOT DETECTED NOT DETECTED Final   Parainfluenza Virus 1 NOT DETECTED NOT DETECTED Final   Parainfluenza Virus 2 NOT DETECTED NOT DETECTED Final   Parainfluenza Virus 3 NOT DETECTED NOT DETECTED Final   Parainfluenza Virus 4 NOT DETECTED NOT DETECTED Final   Respiratory Syncytial Virus NOT DETECTED NOT DETECTED Final   Bordetella pertussis NOT DETECTED NOT DETECTED Final   Chlamydophila pneumoniae NOT DETECTED NOT DETECTED Final   Mycoplasma pneumoniae NOT DETECTED NOT DETECTED Final    Comment: Performed at [facility], [address], [place], [place] [address]  MRSA PCR Screening     Status: Abnormal   Collection Time: 01/06/19  5:57 AM  Result Value Ref Range Status   MRSA by PCR POSITIVE (A) NEGATIVE Final    Comment:        The GeneXpert MRSA Assay (FDA approved for NASAL specimens only), is one component of a comprehensive MRSA colonization surveillance program. It is not intended to diagnose MRSA infection nor to guide or monitor treatment for MRSA infections.  RESULT CALLED TO, READ BACK BY AND VERIFIED WITH: RN S WARD [phone] MLM Performed at [facility], [address] [place], [place] [address]   Expectorated sputum assessment w rflx to resp cult     Status: None   Collection Time: 01/07/19  6:18 PM  Result Value Ref Range Status   Specimen Description EXPECTORATED SPUTUM  Final   Special Requests NONE  Final   Sputum evaluation   Final    Sputum specimen not acceptable for testing.  Please recollect.   RESULT CALLED TO, READ BACK BY AND VERIFIED WITH: Catalina Pizza RN 01/07/19 1917 JDW Performed at  [facility], [address], [place], [place] [address]    Report Status 01/07/2019 FINAL  Final  Expectorated sputum assessment w rflx to resp cult     Status: None   Collection Time: 01/07/19  9:19 PM  Result Value Ref Range Status   Specimen Description EXPECTORATED SPUTUM  Final   Special Requests NONE  Final   Sputum evaluation   Final    THIS SPECIMEN IS ACCEPTABLE FOR SPUTUM CULTURE Performed at [facility], [address], [place], [place] [address]    Report Status 01/08/2019 FINAL  Final  Culture, respiratory     Status: None   Collection Time: 01/07/19  9:19 PM  Result Value Ref Range Status   Specimen Description EXPECTORATED SPUTUM  Final   Special Requests NONE Reflexed from [id number]  Final   Gram Stain   Final    MODERATE WBC PRESENT, PREDOMINANTLY PMN MODERATE GRAM NEGATIVE RODS MODERATE GRAM POSITIVE COCCI    Culture   Final    FEW Consistent with normal respiratory flora. Performed at [facility], [address], [place], [place] [address]    Report Status 01/10/2019 FINAL  Final         Radiology Studies: No results found.      Scheduled Meds: . amLODipine  10 mg Oral Daily  . atorvastatin  80 mg Oral q1800  . budesonide (PULMICORT) nebulizer solution  0.25 mg Nebulization BID  . carvedilol  25 mg Oral BID WC  . cloNIDine  0.3 mg Oral BID  . clopidogrel  75 mg Oral Daily  . [START ON 01/13/2019] enoxaparin (LOVENOX) injection  40 mg Subcutaneous Q24H  . furosemide  40 mg Oral Daily  . hydrALAZINE  100 mg Oral Q8H  . insulin aspart  0-15 Units Subcutaneous TID AC & HS  . isosorbide dinitrate  20 mg Oral TID  . mouth rinse  15 mL Mouth Rinse BID  . pantoprazole  40 mg Oral QHS  . sodium chloride flush  3 mL Intravenous Q12H  . sodium chloride flush  3 mL Intravenous Q12H  . spironolactone  50 mg Oral Daily   Continuous Infusions: . sodium chloride Stopped (01/07/19 1450)  . sodium chloride    . sodium chloride        LOS: 6 days   Time spent= 35 mins    Meagan Spease Joline Maxcy, MD [facility]  If 7PM-7AM, please contact night-coverage [email or web address] 01/12/2019, 1:09 PM

## 2019-01-12 NOTE — Interval H&P Note (Signed)
Cath Lab Visit (complete for each Cath Lab visit)  Clinical Evaluation Leading to the Procedure:   ACS: Yes.    Non-ACS:    Anginal Classification: CCS IV  Anti-ischemic medical therapy: Maximal Therapy (2 or more classes of medications)  Non-Invasive Test Results: No non-invasive testing performed  Prior CABG: No previous CABG      History and Physical Interval Note:  01/12/2019 7:49 AM  Chris Powell  has presented today for surgery, with the diagnosis of coronary artery disease.  The various methods of treatment have been discussed with the patient and family. After consideration of risks, benefits and other options for treatment, the patient has consented to  Procedure(s): CORONARY STENT INTERVENTION (N/A) as a surgical intervention.  The patient's history has been reviewed, patient examined, no change in status, stable for surgery.  I have reviewed the patient's chart and labs.  Questions were answered to the patient's satisfaction.     Tonny Bollman

## 2019-01-12 NOTE — Progress Notes (Signed)
Pt ambulated in a hallway without distress, DC fluids, tolerated diet, no any complain of pain, insertion site looks clean, dry intact, dressing changed, level 0, will continue to monitor  Lonia Farber, RN

## 2019-01-13 ENCOUNTER — Telehealth: Payer: Self-pay | Admitting: Cardiovascular Disease

## 2019-01-13 ENCOUNTER — Encounter (HOSPITAL_COMMUNITY): Payer: Self-pay | Admitting: Cardiovascular Disease

## 2019-01-13 DIAGNOSIS — I5023 Acute on chronic systolic (congestive) heart failure: Secondary | ICD-10-CM

## 2019-01-13 LAB — CBC
HCT: 38.8 % — ABNORMAL LOW (ref 39.0–52.0)
Hemoglobin: 12.5 g/dL — ABNORMAL LOW (ref 13.0–17.0)
MCH: 28.7 pg (ref 26.0–34.0)
MCHC: 32.2 g/dL (ref 30.0–36.0)
MCV: 89 fL (ref 80.0–100.0)
Platelets: 254 10*3/uL (ref 150–400)
RBC: 4.36 MIL/uL (ref 4.22–5.81)
RDW: 15.1 % (ref 11.5–15.5)
WBC: 9.6 10*3/uL (ref 4.0–10.5)
nRBC: 0 % (ref 0.0–0.2)

## 2019-01-13 LAB — MAGNESIUM: Magnesium: 2.2 mg/dL (ref 1.7–2.4)

## 2019-01-13 LAB — BASIC METABOLIC PANEL
Anion gap: 8 (ref 5–15)
BUN: 17 mg/dL (ref 6–20)
CO2: 25 mmol/L (ref 22–32)
Calcium: 8.9 mg/dL (ref 8.9–10.3)
Chloride: 103 mmol/L (ref 98–111)
Creatinine, Ser: 1.2 mg/dL (ref 0.61–1.24)
GFR calc Af Amer: >60 mL/min (ref >60)
GFR calc non Af Amer: >60 mL/min (ref >60)
Glucose, Bld: 92 mg/dL (ref 70–99)
Potassium: 4.1 mmol/L (ref 3.5–5.1)
Sodium: 136 mmol/L (ref 135–145)

## 2019-01-13 LAB — GLUCOSE, CAPILLARY
Glucose-Capillary: 152 mg/dL — ABNORMAL HIGH (ref 70–99)
Glucose-Capillary: 96 mg/dL (ref 70–99)

## 2019-01-13 LAB — LIPID PANEL
Cholesterol: 130 mg/dL (ref 0–200)
HDL: 43 mg/dL (ref >40)
LDL Cholesterol: 74 mg/dL (ref 0–99)
Total CHOL/HDL Ratio: 3 RATIO
Triglycerides: 66 mg/dL (ref <150)
VLDL: 13 mg/dL (ref 0–40)

## 2019-01-13 LAB — HEMOGLOBIN A1C
Hgb A1c MFr Bld: 6.1 % — ABNORMAL HIGH (ref 4.8–5.6)
Mean Plasma Glucose: 128.37 mg/dL

## 2019-01-13 MED ORDER — CARVEDILOL 25 MG PO TABS: 25.0000 mg | ORAL_TABLET | Freq: Two times a day (BID) | ORAL | 0 refills | Status: DC

## 2019-01-13 MED ORDER — CLOPIDOGREL BISULFATE 75 MG PO TABS: 75.0000 mg | ORAL_TABLET | Freq: Every day | ORAL | 0 refills | Status: DC

## 2019-01-13 MED ORDER — ISOSORBIDE DINITRATE 20 MG PO TABS: 20.0000 mg | ORAL_TABLET | Freq: Three times a day (TID) | ORAL | 0 refills | Status: DC

## 2019-01-13 NOTE — Progress Notes (Signed)
Pt requested sleep aid- on call hospitalist informed, order rec'd - see MAR for intervention

## 2019-01-13 NOTE — Telephone Encounter (Signed)
  TOC hospital f/u on 01/22/19 @ 9:00 with Jeraldine Loots per Laverda Page

## 2019-01-13 NOTE — TOC Transition Note (Addendum)
Transition of Care [facility]) - CM/SW Discharge Note   Patient Details  Name: Chris Powell MRN: [id number] Date of Birth: 04-Jun-1966  [facility] of [facility]) CM/SW Contact:  Cherylann Parr, RN Phone Number: 01/13/2019, 11:50 AM   Clinical Narrative:    Pt to discharge home today, pt remains independent at baseline.  CM secured follow up appt with PCP  - office informed that pt will need BMP draw at appt.  CM requested pt to take AVS to appt.    CM faxed discharge summary to DO Ashly Gottschalk at [phone] as requested, CM also reiterated on fax sheet that pt will need BMP draw during appt  Final next level of care: Home/Self Care Barriers to Discharge: No Barriers Identified   Patient Goals and CMS Choice Patient states their goals for this hospitalization and ongoing recovery are:: (Pt is ready to get back home and spend time with his family)      Discharge Placement                       Discharge Plan and Services                          Social Determinants of Health (SDOH) Interventions     Readmission Risk Interventions Readmission Risk Prevention Plan 01/13/2019 01/13/2019  Transportation Screening Complete Complete  PCP or Specialist Appt within 3-5 Days Complete -  HRI or Home Care Consult Complete -  Social Work Consult for Recovery Care Planning/Counseling Patient refused -  Palliative Care Screening Not Applicable -  Medication Review Oceanographer) Complete -  Some recent data might be hidden

## 2019-01-13 NOTE — Progress Notes (Signed)
Progress Note  Patient Name: Chris Powell Date of Encounter: 01/13/2019  Primary Cardiologist: Mertie Moores, MD   Subjective   Feels well today. Denies any chest pain or SOB. Groin is a little tender.  Inpatient Medications    Scheduled Meds:  amLODipine  10 mg Oral Daily   atorvastatin  80 mg Oral q1800   budesonide (PULMICORT) nebulizer solution  0.25 mg Nebulization BID   carvedilol  25 mg Oral BID WC   cloNIDine  0.3 mg Oral BID   clopidogrel  75 mg Oral Daily   enoxaparin (LOVENOX) injection  40 mg Subcutaneous Q24H   furosemide  40 mg Oral Daily   hydrALAZINE  100 mg Oral Q8H   insulin aspart  0-15 Units Subcutaneous TID AC & HS   isosorbide dinitrate  20 mg Oral TID   mouth rinse  15 mL Mouth Rinse BID   pantoprazole  40 mg Oral QHS   sodium chloride flush  3 mL Intravenous Q12H   sodium chloride flush  3 mL Intravenous Q12H   spironolactone  50 mg Oral Daily   Continuous Infusions:  sodium chloride Stopped (01/07/19 1450)   sodium chloride     sodium chloride     PRN Meds: sodium chloride, sodium chloride, sodium chloride, acetaminophen, bisacodyl, docusate, guaiFENesin-dextromethorphan, hydrALAZINE, ipratropium-albuterol, labetalol, magnesium hydroxide, menthol-cetylpyridinium, morphine injection, ondansetron (ZOFRAN) IV, sodium chloride flush, sodium chloride flush, zolpidem   Vital Signs    Vitals:   01/13/19 0601 01/13/19 0615 01/13/19 0732 01/13/19 0838  BP: (!) 133/91   121/73  Pulse:   82   Resp:   18 (!) 22  Temp:    98.2 F (36.8 C)  TempSrc:    Oral  SpO2:      Weight:  (!) 159.2 kg    Height:        Intake/Output Summary (Last 24 hours) at 01/13/2019 1021 Last data filed at 01/13/2019 0837 Gross per 24 hour  Intake 1800 ml  Output 1500 ml  Net 300 ml   Last 3 Weights 01/13/2019 01/11/2019 01/11/2019  Weight (lbs) 350 lb 15.6 oz 348 lb 347 lb 11.2 oz  Weight (kg) 159.2 kg 157.852 kg 157.716 kg      Telemetry    NSR with PVCs, few junctional beats - Personally Reviewed  ECG    Today- NSR with inferolateral T wave inversion. I have personally reviewed and interpreted this study.   Physical Exam   GEN: No acute distress.  Morbidly obese Neck: unable to assess JVD due to size Cardiac: RRR, no murmurs, rubs, or gallops.  Respiratory: Clear to auscultation bilaterally. GI: Soft, nontender, non-distended  MS: No edema; No deformity. Right groin is mildly tender to palpation. No hematoma. Neuro:  Nonfocal  Psych: Normal affect   Labs    Chemistry Recent Labs  Lab 01/11/19 0634 01/12/19 0445 01/13/19 0222  NA 138 137 136  K 3.4* 3.7 4.1  CL 103 104 103  CO2 '25 24 25  ' GLUCOSE 116* 113* 92  BUN '14 20 17  ' CREATININE 1.26* 1.13 1.20  CALCIUM 8.9 8.9 8.9  GFRNONAA >60 >60 >60  GFRAA >60 >60 >60  ANIONGAP '10 9 8     ' Hematology Recent Labs  Lab 01/11/19 0634 01/12/19 0445 01/13/19 0222  WBC 9.6 9.1 9.6  RBC 4.69 4.48 4.36  HGB 13.1 12.7* 12.5*  HCT 41.8 40.2 38.8*  MCV 89.1 89.7 89.0  MCH 27.9 28.3 28.7  MCHC 31.3 31.6 32.2  RDW 15.1 15.2 15.1  PLT 242 277 254    Cardiac Enzymes Recent Labs  Lab 01/06/19 1827 01/07/19 1041 01/07/19 1440 01/07/19 2205  TROPONINI 4.66* 8.13* 6.62* 7.46*    No results for input(s): TROPIPOC in the last 168 hours.   BNP No results for input(s): BNP, PROBNP in the last 168 hours.   DDimer No results for input(s): DDIMER in the last 168 hours.   Radiology    No results found.  Cardiac Studies   Procedures   RIGHT/LEFT HEART CATH AND CORONARY ANGIOGRAPHY  Conclusion     Non-stenotic Prox LAD-1 lesion was previously treated.  Prox LAD-2 lesion is 50% stenosed.  1st Mrg lesion is 80% stenosed.  Prox RCA to Mid RCA lesion is 99% stenosed.  Mid RCA lesion is 30% stenosed.  Ost 1st Mrg lesion is 90% stenosed.  Ost LAD to Prox LAD lesion is 90% stenosed.  Mid LAD to Dist LAD lesion is 60% stenosed.    Findings:  Ao = 170/102 (123) LV =  170/24 RA =  12 RV =  62/19 PA =   61/26 (40) PCW = 32 with v waves to 40 Fick cardiac output/index = 7.3/2.9 PVR = 1.0 WU FA sat = 94% PA sat = 69%, 69%  Assessment: 1. 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 Volume overload with moderate pulmonary venous HTN  Plan/Discussion:  Given morbid obesity and diffuse distal LAD disease not candidate for CABG. Films reviewed with Dr. Burt Knack. Plan PCI of RCA and LAD on Tuesday. Will diurese. Watch creatinine closely. Load Plavix.   Glori Bickers, MD  5:30 PM    Echo: IMPRESSIONS    1. The left ventricle has mild-moderately reduced systolic function, with an ejection fraction of 40-45%. The cavity size was normal. There is mildly increased left ventricular wall thickness. Left ventricular diastolic parameters were normal. Left  ventricular diffuse hypokinesis.  2. The right ventricle has normal systolic function. The cavity was normal. There is no increase in right ventricular wall thickness.  3. Left atrial size was mildly dilated.  4. The aortic valve is tricuspid.  5. The inferior vena cava was dilated in size with >50% respiratory variability.   Procedures   CORONARY STENT INTERVENTION  Left Heart Cath  Conclusion   Successful 2 vessel PCI with stenting of the mid-LAD (2.75x16 mm Synergy DES), PTCA of severe ISR of the proximal LAD (3.0 mm [place] balloon 20 atm), and stenting of the proximal RCA (3.5x20 mm Synergy DES)  Recommend: aggressive risk factor modification, DAPT with ASA and clopidogrel x minimum 12 months. NTG drip initiated in cath lab to help control severe HTN. Anticipate DC home tomorrow if stable      Patient Profile     53 y.o. male with a hx of CADs/p NSTEMI 2016 treated with DES to LAD, HTN, OSA on CPAP, DM,COPDand ongoing tobacco smokingwho  was admitted with acute respiratory failure, NSTEMI and pulmonary edema.  Assessment & Plan    1. Acute hypoxic respiratory failure: -  due to flash pulmonary edema in setting of HTN crisis/cocaine use - improved with diuresis and BP control  - appears euvolemic  2.Elevated troponi/NSTEMI: -Pt has known CAD with prior LAD stenting in 2016. 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  - Troponin peaked at 8.1 -  EKG withchronic lateralT wave abnormalities but no acute ST segment changes  - now s/p PCI with stenting of the RCA and mid LAD. PTCA of the prior LAD stent. No complications. On ASA and Plavix.  3. HTN crisis: - BP is much better since clonidine resumed.  - Adjust meds as needed. Lisinopril/aldactone on hold with AKI. Consider resuming as outpatient if renal function stable.  - will split Bidil so we can titrate hydralazine higher. On amlodipine 10 mg daily and Coreg 25 mg bid. Will resume clonidine at 0.3 mg bid. On hydralazine 100 mg tid  4. Acute systolic heart failure: -Echocardiogram 01/06/2019 with LVEF of 40-45% with diffuse hypokinesis -LV gram on cath last year with similar LV function  -CXR with acute pulmonary edema- resolved - will need to titrate HF meds as renal function improved.   5. AKI: -Creatinine, 2.15 -> 1.20 today with a baseline of 1.0-1.3 - Likely can resume ACE/ARB/aldactone after cath   6. Cocaine use - discussed need for cessation he is motivated to quit  7. Tobacco abuse counseled on smoking cessation  Patient is stable for DC today from our standpoint  [place] will sign off.   Medication Recommendations:  As per [facility] Other recommendations (labs, testing, etc):  none Follow up as an outpatient:  With Dr Liana Crocker in 2-3 weeks        For questions or updates, please contact [facility] Please consult [email or web address] for contact info under        Signed, Drew Lips [place], MD   01/13/2019, 10:21 AM

## 2019-01-13 NOTE — Progress Notes (Signed)
Per telemetry monitor, pt had a 4 beat run of junctional rhythm, bradycardic in the 40's with an immediate rebound back to baseline. Pt lying in bed, watching TV. Pt denies CP, SOB or diaphoresis, agrees to inform staff should any of these symptoms manifest.

## 2019-01-13 NOTE — Discharge Summary (Signed)
Physician Discharge Summary  Chris Powell GEX:[id number] DOB: 20-Feb-1966 DOA: 01/06/2019  PCP: Raliegh Ip, DO  Admit date: 01/06/2019 Discharge date: 01/13/2019  Admitted From: Home Disposition: Home  Recommendations for Outpatient Follow-up:  1. Follow up with PCP in 1 week with repeat CBC/BMP 2. Outpatient follow-up with cardiology 3. Follow up in ED if symptoms worsen or new appear   Home Health: No Equipment/Devices: None  Discharge Condition: Stable CODE STATUS: Full Diet recommendation: Heart healthy  Brief/Interim Summary: 53 year old male with history of morbid obesity, hypertension, COPD, diabetes mellitus type 2, tobacco abuse was found unresponsive by EMS.  He was noted to have abnormal EKG.  UDS was positive for cocaine.  COVID testing was negative.  He underwent cardiac catheterization on 01/08/2019 and found to have severe coronary artery disease with high-grade lesion.  Echo showed EF of 40 to 45%.  He was not amenable for CABG therefore underwent repeat cardiac catheterization and stenting on 01/12/2019.  Cardiology has cleared the patient for discharge.  He will be discharged home with outpatient follow-up with cardiology.  Discharge Diagnoses:  Principal Problem:   Acute hypercapnic respiratory failure (HCC) Active Problems:   Morbid obesity-BMI 51   COPD with exacerbation (HCC)   Type 2 diabetes mellitus with renal complication (HCC)   Acute pulmonary edema (HCC)   AKI (acute kidney injury) (HCC)   Elevated troponin   Non-ST elevation (NSTEMI) myocardial infarction (HCC)  Elevated troponin/non-STEMI Severe coronary artery disease -Status post cardiac catheterization on 01/08/2019 which showed severe CAD but not amenable to CABG therefore repeat cardiac catheterization was done on 01/12/2019 and subsequent stenting of the RCA and mid LAD. -Currently chest pain-free.  Cardiology has cleared the patient for discharge.  He will need dual antiplatelet  therapy with aspirin and Plavix for minimum of 12 months.  Outpatient follow-up with cardiology.  Continue statin, beta-blocker.  Hypertensive urgency: Improved Acute hypoxic respiratory failure, multifactorial Flash pulmonary edema -Secondary to uncontrolled hypertension.  Improved with diuresis.  Currently euvolemic.  Off nitroglycerin drip. -Blood pressure improved.  Continue amlodipine, Coreg, isosorbide dinitrate, Lasix, spironolactone, clonidine, hydralazine.  Lisinopril on hold but can be resumed as an outpatient if renal function remained stable.  Acute systolic heart failure with EF of 40 to 455% -Currently euvolemic.  Outpatient follow-up with cardiology.  Medications as above  Morbid obesity OSA on CPAP -Continue CPAP at bedtime.  Outpatient follow-up  Illicit drug use -Counseled to quit   Tobacco abuse -Counseled regarding cessation  Diabetes mellitus type 2 -Outpatient follow-up.  Carb modified diet. Discharge Instructions  Discharge Instructions    (HEART FAILURE PATIENTS) Call MD:  Anytime you have any of the following symptoms: 1) 3 pound weight gain in 24 hours or 5 pounds in 1 week 2) shortness of breath, with or without a dry hacking cough 3) swelling in the hands, feet or stomach 4) if you have to sleep on extra pillows at night in order to breathe.   Complete by:  As directed    Amb Referral to Cardiac Rehabilitation   Complete by:  As directed    Diagnosis:   NSTEMI Coronary Stents     Ambulatory referral to Cardiology   Complete by:  As directed    Diet - low sodium heart healthy   Complete by:  As directed    Increase activity slowly   Complete by:  As directed      Allergies as of 01/13/2019   No Known Allergies  Medication List    STOP taking these medications   lisinopril 40 MG tablet Commonly known as:  ZESTRIL     TAKE these medications   albuterol 108 (90 Base) MCG/ACT inhaler Commonly known as:  VENTOLIN HFA Inhale 2 puffs into  the lungs every 6 (six) hours as needed for wheezing or shortness of breath.   amLODipine 10 MG tablet Commonly known as:  NORVASC Take 1 tablet (10 mg total) by mouth daily.   aspirin EC 81 MG tablet Take 81 mg by mouth daily.   atorvastatin 80 MG tablet Commonly known as:  LIPITOR Take 1 tablet (80 mg total) by mouth daily at 6 PM.   carvedilol 25 MG tablet Commonly known as:  COREG Take 1 tablet (25 mg total) by mouth 2 (two) times daily with a meal.   Cholecalciferol 50 MCG (2000 UT) Caps Take 2,000 Units by mouth daily.   cloNIDine 0.3 MG tablet Commonly known as:  CATAPRES Take 1 tablet (0.3 mg total) by mouth 2 (two) times daily.   clopidogrel 75 MG tablet Commonly known as:  PLAVIX Take 1 tablet (75 mg total) by mouth daily. Start taking on:  January 14, 2019   colchicine 0.6 MG tablet Take 1-2 tablets (0.6-1.2 mg total) by mouth daily.   fluticasone 50 MCG/ACT nasal spray Commonly known as:  FLONASE Place 2 sprays into both nostrils daily as needed for allergies.   furosemide 40 MG tablet Commonly known as:  Lasix Take 1 tablet (40 mg total) by mouth daily.   gabapentin 600 MG tablet Commonly known as:  NEURONTIN Take 600 mg by mouth 3 (three) times daily.   hydrALAZINE 50 MG tablet Commonly known as:  APRESOLINE Take 1 tablet (50 mg total) by mouth 3 (three) times daily.   isosorbide dinitrate 20 MG tablet Commonly known as:  ISORDIL Take 1 tablet (20 mg total) by mouth 3 (three) times daily.   nitroGLYCERIN 0.4 MG SL tablet Commonly known as:  NITROSTAT Place 1 tablet (0.4 mg total) under the tongue every 5 (five) minutes as needed for chest pain.   omeprazole 20 MG capsule Commonly known as:  PRILOSEC Take 1 capsule (20 mg total) by mouth daily before breakfast.   Oxycodone HCl 10 MG Tabs Take 1-2 tablets (10-20 mg total) by mouth 4 (four) times daily as needed (for pain). What changed:    how much to take  when to take this   potassium  chloride 10 MEQ tablet Commonly known as:  K-DUR Take 1 tablet (10 mEq total) by mouth daily.   sildenafil 20 MG tablet Commonly known as:  REVATIO TAKE 2-5 TABLETS AS NEEDED PRIOR TO SEXUAL ACTIVITY What changed:    how much to take  how to take this  when to take this  reasons to take this  additional instructions   spironolactone 50 MG tablet Commonly known as:  ALDACTONE Take 1 tablet (50 mg total) by mouth daily.      Follow-up Information    Delynn Flavin M, DO. Schedule an appointment as soon as possible for a visit in 1 week(s).   Specialty:  Family Medicine Why:  With repeat BMP Contact information: [address] [place] [address] [phone]        Nahser, Deloris Ping, MD .   Specialty:  Cardiology Contact information: [address] [place] [address] [phone]          No Known Allergies  Consultations:  Cardiology   Procedures/Studies: Dg Chest Portable 1 View  Result Date: 01/06/2019 CLINICAL DATA:  Status post intubation EXAM: PORTABLE CHEST 1 VIEW COMPARISON:  10/05/2018 FINDINGS: Endotracheal tube and gastric catheter are noted in satisfactory position. Cardiac shadow is mildly enlarged. Increased central vascular congestion is noted with diffuse pulmonary edema. No sizable effusion is noted. No bony abnormality is seen. IMPRESSION: Changes consistent with CHF and pulmonary edema. Electronically Signed   By: Alcide Clever M.D.   On: 01/06/2019 03:41     Echo on 01/06/2019 IMPRESSIONS    1. The left ventricle has mild-moderately reduced systolic function, with an ejection fraction of 40-45%. The cavity size was normal. There is mildly increased left ventricular wall thickness. Left ventricular diastolic parameters were normal. Left  ventricular diffuse hypokinesis.  2. The right ventricle has normal systolic function. The cavity was normal. There is no increase in right ventricular wall thickness.  3. Left  atrial size was mildly dilated.  4. The aortic valve is tricuspid.  5. The inferior vena cava was dilated in size with >50% respiratory variability.  Subjective: Patient seen and examined at bedside.  He feels better and wants to go home.  No overnight fever, nausea, vomiting or chest pain.  Discharge Exam: Vitals:   01/13/19 0732 01/13/19 0838  BP:  121/73  Pulse: 82   Resp: 18 (!) 22  Temp:  98.2 F (36.8 C)  SpO2:      General: Pt is alert, awake, not in acute distress Cardiovascular: rate controlled, S1/S2 + Respiratory: bilateral decreased breath sounds at bases with scattered crackles Abdominal: Soft, morbidly obese, NT, ND, bowel sounds + Extremities: Trace edema, no cyanosis    The results of significant diagnostics from this hospitalization (including imaging, microbiology, ancillary and laboratory) are listed below for reference.     Microbiology: Recent Results (from the past 240 hour(s))  Blood culture (routine x 2)     Status: None   Collection Time: 01/06/19  3:37 AM  Result Value Ref Range Status   Specimen Description BLOOD RIGHT HAND  Final   Special Requests   Final    BOTTLES DRAWN AEROBIC ONLY Blood Culture results may not be optimal due to an inadequate volume of blood received in culture bottles   Culture   Final    NO GROWTH 5 DAYS Performed at [facility], [address], [place], [place] [address]    Report Status 01/11/2019 FINAL  Final  Blood culture (routine x 2)     Status: None   Collection Time: 01/06/19  3:41 AM  Result Value Ref Range Status   Specimen Description BLOOD RIGHT HAND  Final   Special Requests   Final    BOTTLES DRAWN AEROBIC AND ANAEROBIC Blood Culture adequate volume   Culture   Final    NO GROWTH 5 DAYS Performed at [facility], [address], [place], [place] [address]    Report Status 01/11/2019 FINAL  Final  SARS Coronavirus 2 [facility] order, Performed in [facility] hospital lab)     Status: None    Collection Time: 01/06/19  3:44 AM  Result Value Ref Range Status   SARS Coronavirus 2 NEGATIVE NEGATIVE Final    Comment: (NOTE) If result is NEGATIVE SARS-CoV-2 target nucleic acids are NOT DETECTED. The SARS-CoV-2 RNA is generally detectable in upper and lower  respiratory specimens during the acute phase of infection. The lowest  concentration of SARS-CoV-2 viral copies this assay can  detect is 250  copies / mL. A negative result does not preclude SARS-CoV-2 infection  and should not be used as the sole basis for treatment or other  patient management decisions.  A negative result may occur with  improper specimen collection / handling, submission of specimen other  than nasopharyngeal swab, presence of viral mutation(s) within the  areas targeted by this assay, and inadequate number of viral copies  (<250 copies / mL). A negative result must be combined with clinical  observations, patient history, and epidemiological information. If result is POSITIVE SARS-CoV-2 target nucleic acids are DETECTED. The SARS-CoV-2 RNA is generally detectable in upper and lower  respiratory specimens dur ing the acute phase of infection.  Positive  results are indicative of active infection with SARS-CoV-2.  Clinical  correlation with patient history and other diagnostic information is  necessary to determine patient infection status.  Positive results do  not rule out bacterial infection or co-infection with other viruses. If result is PRESUMPTIVE POSTIVE SARS-CoV-2 nucleic acids MAY BE PRESENT.   A presumptive positive result was obtained on the submitted specimen  and confirmed on repeat testing.  While 2019 novel coronavirus  (SARS-CoV-2) nucleic acids may be present in the submitted sample  additional confirmatory testing may be necessary for epidemiological  and / or clinical management purposes  to differentiate between  SARS-CoV-2 and other Sarbecovirus currently known to infect humans.   If clinically indicated additional testing with an alternate test  methodology [phone]) is advised. The SARS-CoV-2 RNA is generally  detectable in upper and lower respiratory sp ecimens during the acute  phase of infection. The expected result is Negative. Fact Sheet for Patients:  BoilerBrush.com.cy Fact Sheet for Healthcare Providers: https://pope.com/ This test is not yet approved or cleared by the [place] FDA and has been authorized for detection and/or diagnosis of SARS-CoV-2 by FDA under an Emergency Use Authorization (EUA).  This EUA will remain in effect (meaning this test can be used) for the duration of the COVID-19 declaration under Section 564(b)(1) of the Act, 21 U.S.C. section 360bbb-3(b)(1), unless the authorization is terminated or revoked sooner. Performed at [facility], [address], [place], [place] [address]   Urine culture     Status: None   Collection Time: 01/06/19  4:05 AM  Result Value Ref Range Status   Specimen Description URINE, RANDOM  Final   Special Requests NONE  Final   Culture   Final    NO GROWTH Performed at [facility], [address], [place], [place] [address]    Report Status 01/07/2019 FINAL  Final  Respiratory Panel by PCR     Status: None   Collection Time: 01/06/19  5:57 AM  Result Value Ref Range Status   Adenovirus NOT DETECTED NOT DETECTED Final   Coronavirus 229E NOT DETECTED NOT DETECTED Final    Comment: (NOTE) The Coronavirus on the Respiratory Panel, DOES NOT test for the novel  Coronavirus (2019 nCoV)    Coronavirus HKU1 NOT DETECTED NOT DETECTED Final   Coronavirus NL63 NOT DETECTED NOT DETECTED Final   Coronavirus OC43 NOT DETECTED NOT DETECTED Final   Metapneumovirus NOT DETECTED NOT DETECTED Final   Rhinovirus / Enterovirus NOT DETECTED NOT DETECTED Final   Influenza A NOT DETECTED NOT DETECTED Final   Influenza B NOT DETECTED NOT DETECTED Final    Parainfluenza Virus 1 NOT DETECTED NOT DETECTED Final   Parainfluenza Virus 2 NOT DETECTED NOT DETECTED Final  Parainfluenza Virus 3 NOT DETECTED NOT DETECTED Final   Parainfluenza Virus 4 NOT DETECTED NOT DETECTED Final   Respiratory Syncytial Virus NOT DETECTED NOT DETECTED Final   Bordetella pertussis NOT DETECTED NOT DETECTED Final   Chlamydophila pneumoniae NOT DETECTED NOT DETECTED Final   Mycoplasma pneumoniae NOT DETECTED NOT DETECTED Final    Comment: Performed at [facility], [address] [place], [place] [address]  MRSA PCR Screening     Status: Abnormal   Collection Time: 01/06/19  5:57 AM  Result Value Ref Range Status   MRSA by PCR POSITIVE (A) NEGATIVE Final    Comment:        The GeneXpert MRSA Assay (FDA approved for NASAL specimens only), is one component of a comprehensive MRSA colonization surveillance program. It is not intended to diagnose MRSA infection nor to guide or monitor treatment for MRSA infections. RESULT CALLED TO, READ BACK BY AND VERIFIED WITH: RN S WARD [phone] MLM Performed at [facility], [address] [place], [place] [address]   Expectorated sputum assessment w rflx to resp cult     Status: None   Collection Time: 01/07/19  6:18 PM  Result Value Ref Range Status   Specimen Description EXPECTORATED SPUTUM  Final   Special Requests NONE  Final   Sputum evaluation   Final    Sputum specimen not acceptable for testing.  Please recollect.   RESULT CALLED TO, READ BACK BY AND VERIFIED WITH: Catalina Pizza RN 01/07/19 1917 JDW Performed at [facility], [address] [place], [place] [address]    Report Status 01/07/2019 FINAL  Final  Expectorated sputum assessment w rflx to resp cult     Status: None   Collection Time: 01/07/19  9:19 PM  Result Value Ref Range Status   Specimen Description EXPECTORATED SPUTUM  Final   Special Requests NONE  Final   Sputum evaluation   Final    THIS SPECIMEN IS ACCEPTABLE FOR  SPUTUM CULTURE Performed at [facility], [address], [place], [place] [address]    Report Status 01/08/2019 FINAL  Final  Culture, respiratory     Status: None   Collection Time: 01/07/19  9:19 PM  Result Value Ref Range Status   Specimen Description EXPECTORATED SPUTUM  Final   Special Requests NONE Reflexed from [id number]  Final   Gram Stain   Final    MODERATE WBC PRESENT, PREDOMINANTLY PMN MODERATE GRAM NEGATIVE RODS MODERATE GRAM POSITIVE COCCI    Culture   Final    FEW Consistent with normal respiratory flora. Performed at [facility], [address] [place], [place] [address]    Report Status 01/10/2019 FINAL  Final     Labs: BNP (last 3 results) Recent Labs    10/05/18 1023 01/06/19 0442  BNP 294.0* 431.5*   Basic Metabolic Panel: Recent Labs  Lab 01/07/19 1041 01/08/19 0615  01/08/19 1624 01/09/19 0407 01/11/19 0634 01/12/19 0445 01/13/19 0222  NA  --  141   < > 142  141 141 138 137 136  K  --  3.4*   < > 4.0  4.1 3.6 3.4* 3.7 4.1  CL  --  104  --   --  104 103 104 103  CO2  --  25  --   --  GLUCOSE  --  106*  --   --  96 116* 113* 92  BUN  --  31*  --   --  21* 14 20 17   CREATININE  --  1.65*  --   --  1.29* 1.26* 1.13 1.20  CALCIUM  --  8.7*  --   --  8.8* 8.9 8.9 8.9  MG 2.4 2.3  --   --  2.2  --   --  2.2   < > = values in this interval not displayed.   Liver Function Tests: No results for input(s): AST, ALT, ALKPHOS, BILITOT, PROT, ALBUMIN in the last 168 hours. No results for input(s): LIPASE, AMYLASE in the last 168 hours. No results for input(s): AMMONIA in the last 168 hours. CBC: Recent Labs  Lab 01/08/19 0615  01/08/19 1624 01/09/19 0407 01/11/19 0634 01/12/19 0445 01/13/19 0222  WBC 11.5*  --   --  9.5 9.6 9.1 9.6  NEUTROABS 8.6*  --   --   --   --   --   --   HGB 13.6   < > 14.3  14.3 13.3 13.1 12.7* 12.5*  HCT 43.8   < > 42.0  42.0 42.9 41.8 40.2 38.8*  MCV 91.1  --   --  90.7 89.1 89.7 89.0   PLT 272  --   --  263 242 277 254   < > = values in this interval not displayed.   Cardiac Enzymes: Recent Labs  Lab 01/06/19 1251 01/06/19 1827 01/07/19 1041 01/07/19 1440 01/07/19 2205  TROPONINI 1.92* 4.66* 8.13* 6.62* 7.46*   BNP: Invalid input(s): POCBNP CBG: Recent Labs  Lab 01/11/19 2118 01/12/19 1028 01/12/19 1706 01/12/19 2159 01/13/19 0629  GLUCAP 177* 83 110* 123* 152*   D-Dimer No results for input(s): DDIMER in the last 72 hours. Hgb A1c Recent Labs    01/13/19 0222  HGBA1C 6.1*   Lipid Profile Recent Labs    01/13/19 0222  CHOL 130  HDL 43  LDLCALC 74  TRIG 66  CHOLHDL 3.0   Thyroid function studies No results for input(s): TSH, T4TOTAL, T3FREE, THYROIDAB in the last 72 hours.  Invalid input(s): FREET3 Anemia work up No results for input(s): VITAMINB12, FOLATE, FERRITIN, TIBC, IRON, RETICCTPCT in the last 72 hours. Urinalysis    Component Value Date/Time   COLORURINE AMBER (A) 01/06/2019 0405   APPEARANCEUR CLOUDY (A) 01/06/2019 0405   APPEARANCEUR Clear 09/11/2017 1546   LABSPEC 1.023 01/06/2019 0405   PHURINE 5.0 01/06/2019 0405   GLUCOSEU 150 (A) 01/06/2019 0405   HGBUR MODERATE (A) 01/06/2019 0405   BILIRUBINUR NEGATIVE 01/06/2019 0405   BILIRUBINUR Negative 09/11/2017 1546   KETONESUR NEGATIVE 01/06/2019 0405   PROTEINUR 100 (A) 01/06/2019 0405   NITRITE NEGATIVE 01/06/2019 0405   LEUKOCYTESUR NEGATIVE 01/06/2019 0405   Sepsis Labs Invalid input(s): PROCALCITONIN,  WBC,  LACTICIDVEN Microbiology Recent Results (from the past 240 hour(s))  Blood culture (routine x 2)     Status: None   Collection Time: 01/06/19  3:37 AM  Result Value Ref Range Status   Specimen Description BLOOD RIGHT HAND  Final   Special Requests   Final    BOTTLES DRAWN AEROBIC ONLY Blood Culture results may not be optimal due to an inadequate volume of blood received in culture bottles   Culture   Final    NO GROWTH 5 DAYS Performed at [facility], [address] [place], [place] [address]    Report Status 01/11/2019 FINAL  Final  Blood culture (routine x 2)     Status: None  Collection Time: 01/06/19  3:41 AM  Result Value Ref Range Status   Specimen Description BLOOD RIGHT HAND  Final   Special Requests   Final    BOTTLES DRAWN AEROBIC AND ANAEROBIC Blood Culture adequate volume   Culture   Final    NO GROWTH 5 DAYS Performed at [facility], [address] [place], [place] [address]    Report Status 01/11/2019 FINAL  Final  SARS Coronavirus 2 [facility] order, Performed in [facility] hospital lab)     Status: None   Collection Time: 01/06/19  3:44 AM  Result Value Ref Range Status   SARS Coronavirus 2 NEGATIVE NEGATIVE Final    Comment: (NOTE) If result is NEGATIVE SARS-CoV-2 target nucleic acids are NOT DETECTED. The SARS-CoV-2 RNA is generally detectable in upper and lower  respiratory specimens during the acute phase of infection. The lowest  concentration of SARS-CoV-2 viral copies this assay can detect is 250  copies / mL. A negative result does not preclude SARS-CoV-2 infection  and should not be used as the sole basis for treatment or other  patient management decisions.  A negative result may occur with  improper specimen collection / handling, submission of specimen other  than nasopharyngeal swab, presence of viral mutation(s) within the  areas targeted by this assay, and inadequate number of viral copies  (<250 copies / mL). A negative result must be combined with clinical  observations, patient history, and epidemiological information. If result is POSITIVE SARS-CoV-2 target nucleic acids are DETECTED. The SARS-CoV-2 RNA is generally detectable in upper and lower  respiratory specimens dur ing the acute phase of infection.  Positive  results are indicative of active infection with SARS-CoV-2.  Clinical  correlation with patient history and other diagnostic information is  necessary to  determine patient infection status.  Positive results do  not rule out bacterial infection or co-infection with other viruses. If result is PRESUMPTIVE POSTIVE SARS-CoV-2 nucleic acids MAY BE PRESENT.   A presumptive positive result was obtained on the submitted specimen  and confirmed on repeat testing.  While 2019 novel coronavirus  (SARS-CoV-2) nucleic acids may be present in the submitted sample  additional confirmatory testing may be necessary for epidemiological  and / or clinical management purposes  to differentiate between  SARS-CoV-2 and other Sarbecovirus currently known to infect humans.  If clinically indicated additional testing with an alternate test  methodology [phone]) is advised. The SARS-CoV-2 RNA is generally  detectable in upper and lower respiratory sp ecimens during the acute  phase of infection. The expected result is Negative. Fact Sheet for Patients:  BoilerBrush.com.cy Fact Sheet for Healthcare Providers: https://pope.com/ This test is not yet approved or cleared by the [place] FDA and has been authorized for detection and/or diagnosis of SARS-CoV-2 by FDA under an Emergency Use Authorization (EUA).  This EUA will remain in effect (meaning this test can be used) for the duration of the COVID-19 declaration under Section 564(b)(1) of the Act, 21 U.S.C. section 360bbb-3(b)(1), unless the authorization is terminated or revoked sooner. Performed at [facility], [address], [place], [place] [address]   Urine culture     Status: None   Collection Time: 01/06/19  4:05 AM  Result Value Ref Range Status   Specimen Description URINE, RANDOM  Final   Special Requests NONE  Final   Culture   Final    NO GROWTH Performed at [facility], [address] [place],  [place] [address]    Report Status 01/07/2019 FINAL  Final  Respiratory Panel by PCR     Status: None   Collection Time: 01/06/19   5:57 AM  Result Value Ref Range Status   Adenovirus NOT DETECTED NOT DETECTED Final   Coronavirus 229E NOT DETECTED NOT DETECTED Final    Comment: (NOTE) The Coronavirus on the Respiratory Panel, DOES NOT test for the novel  Coronavirus (2019 nCoV)    Coronavirus HKU1 NOT DETECTED NOT DETECTED Final   Coronavirus NL63 NOT DETECTED NOT DETECTED Final   Coronavirus OC43 NOT DETECTED NOT DETECTED Final   Metapneumovirus NOT DETECTED NOT DETECTED Final   Rhinovirus / Enterovirus NOT DETECTED NOT DETECTED Final   Influenza A NOT DETECTED NOT DETECTED Final   Influenza B NOT DETECTED NOT DETECTED Final   Parainfluenza Virus 1 NOT DETECTED NOT DETECTED Final   Parainfluenza Virus 2 NOT DETECTED NOT DETECTED Final   Parainfluenza Virus 3 NOT DETECTED NOT DETECTED Final   Parainfluenza Virus 4 NOT DETECTED NOT DETECTED Final   Respiratory Syncytial Virus NOT DETECTED NOT DETECTED Final   Bordetella pertussis NOT DETECTED NOT DETECTED Final   Chlamydophila pneumoniae NOT DETECTED NOT DETECTED Final   Mycoplasma pneumoniae NOT DETECTED NOT DETECTED Final    Comment: Performed at [facility], [address], [place], [place] [address]  MRSA PCR Screening     Status: Abnormal   Collection Time: 01/06/19  5:57 AM  Result Value Ref Range Status   MRSA by PCR POSITIVE (A) NEGATIVE Final    Comment:        The GeneXpert MRSA Assay (FDA approved for NASAL specimens only), is one component of a comprehensive MRSA colonization surveillance program. It is not intended to diagnose MRSA infection nor to guide or monitor treatment for MRSA infections. RESULT CALLED TO, READ BACK BY AND VERIFIED WITH: RN S WARD [phone] MLM Performed at [facility], [address], [place], [place] [address]   Expectorated sputum assessment w rflx to resp cult     Status: None   Collection Time: 01/07/19  6:18 PM  Result Value Ref Range Status   Specimen Description EXPECTORATED SPUTUM  Final    Special Requests NONE  Final   Sputum evaluation   Final    Sputum specimen not acceptable for testing.  Please recollect.   RESULT CALLED TO, READ BACK BY AND VERIFIED WITH: Catalina Pizza RN 01/07/19 1917 JDW Performed at [facility], [address] [place], [place] [address]    Report Status 01/07/2019 FINAL  Final  Expectorated sputum assessment w rflx to resp cult     Status: None   Collection Time: 01/07/19  9:19 PM  Result Value Ref Range Status   Specimen Description EXPECTORATED SPUTUM  Final   Special Requests NONE  Final   Sputum evaluation   Final    THIS SPECIMEN IS ACCEPTABLE FOR SPUTUM CULTURE Performed at [facility], [address] [place], [place] [address]    Report Status 01/08/2019 FINAL  Final  Culture, respiratory     Status: None   Collection Time: 01/07/19  9:19 PM  Result Value Ref Range Status   Specimen Description EXPECTORATED SPUTUM  Final   Special Requests NONE Reflexed from [id number]  Final   Gram Stain   Final    MODERATE WBC PRESENT, PREDOMINANTLY PMN MODERATE GRAM NEGATIVE RODS MODERATE GRAM POSITIVE COCCI    Culture   Final  FEW Consistent with normal respiratory flora. Performed at [facility], [address], [place], [place] [address]    Report Status 01/10/2019 FINAL  Final     Time coordinating discharge: 35 minutes  SIGNED:   Glade Lloyd, MD  [facility] 01/13/2019, 10:57 AM

## 2019-01-13 NOTE — Progress Notes (Signed)
Cardiac Rehab [facility] Cardiac Rehab Phase I is not seeing pts face to face at this time due to Covid 19 restrictions. Ambulation is occurring through nursing, PT, and mobility teams. We will help facilitate that process as needed. We are calling pts in their rooms and discussing education. We will then deliver education materials to pts RN for delivery to pt.   [phone] MI and CHF education completed by phone. Pt has done CRP 2 [place] before and is in agreement for referral back to [facility] program. Reviewed importance of plavix with stent. Reviewed NTG use, MI restrictions, ex ed and walking instructions given, signs/symptoms of CHF, heart healthy food choices and low sodium foods, and importance of daily weights.  Will give MI and CHF booklets and diets to RN to give to pt.  Discussed smoking cessation. Pt has cut down to one half a pack and hopes to continue and quit.  Pt voiced understanding of all ed.Luetta Nutting RN BSN 01/13/2019 9:01 AM

## 2019-01-14 NOTE — Telephone Encounter (Signed)
Patient contacted regarding discharge from [facility] on Wednesday January 13, 2019.  Patient understands to follow up with provider Berton Bon on May 1,2020 at 9 AM Virtual Visit. Patient understands discharge instructions? yes Patient understands medications and regiment? yes Patient understands to have all medications at this visit? Yes  I confirmed MyChart is active, pt aware he will get phone call from our office 2-3 days before Virtual Visit.

## 2019-01-18 ENCOUNTER — Telehealth: Payer: Self-pay

## 2019-01-18 NOTE — Telephone Encounter (Signed)
Attempted to reach pt in regards to appt with Lizabeth Leyden, NP on 5/1

## 2019-01-19 ENCOUNTER — Ambulatory Visit: Payer: Self-pay | Admitting: Family Medicine

## 2019-01-20 ENCOUNTER — Encounter: Payer: Self-pay | Admitting: Family Medicine

## 2019-01-20 ENCOUNTER — Encounter: Payer: Self-pay | Admitting: Physician Assistant

## 2019-01-20 ENCOUNTER — Other Ambulatory Visit: Payer: Self-pay

## 2019-01-20 ENCOUNTER — Ambulatory Visit (INDEPENDENT_AMBULATORY_CARE_PROVIDER_SITE_OTHER): Payer: Medicaid Other | Admitting: Physician Assistant

## 2019-01-20 DIAGNOSIS — J209 Acute bronchitis, unspecified: Secondary | ICD-10-CM

## 2019-01-20 MED ORDER — AZITHROMYCIN 250 MG PO TABS: ORAL_TABLET | ORAL | 0 refills | Status: DC

## 2019-01-20 NOTE — Progress Notes (Signed)
Telephone visit  Subjective: ZO:XWRUECC:cough PCP: No primary care provider on file. AVW:[id number]:Chris Powell is a 53 y.o. male calls for telephone consult today. Patient provides verbal consent for consult held via phone.  Patient is identified with 2 separate identifiers.  At this time the entire area is on COVID-19 social distancing and stay home orders are in place.  Patient is of higher risk and therefore we are performing this by a virtual method.  Location of patient: home Location of provider: [facility] Others present for call: no  This patient calls in complaining of cough and sometimes wheezing.  He states that the cough is all throughout the day.  It is very bad at night.  He has had a lot of postnasal drainage.  He also does have a lot of production in the morning.  He states there is some color to the mucus in the yellow and green area, denies any blood.  He denies any fever or chills.  He was in the hospital in the past few weeks for cardiac admission.  He states he had 2 stents placed.   ROS: Per HPI  No Known Allergies Past Medical History:  Diagnosis Date  . Anemia   . Arthritis   . Asthma   . Bone spur of ankle    right   . CHF (congestive heart failure) (HCC)   . COPD (chronic obstructive pulmonary disease) (HCC)   . Coronary artery disease   . Depression   . Diabetes (HCC)    borderline   . GERD (gastroesophageal reflux disease)   . Hyperlipidemia   . Hypertension   . Myocardial infarction (HCC)   . Pneumonia    HX OF PNA  . Right rotator cuff tear 01/06/2018  . Shortness of breath dyspnea   . Sleep apnea    USES CPAP    Current Outpatient Medications:  .  albuterol (PROVENTIL HFA;VENTOLIN HFA) 108 (90 Base) MCG/ACT inhaler, Inhale 2 puffs into the lungs every 6 (six) hours as needed for wheezing or shortness of breath., Disp: 2 Inhaler, Rfl: 2 .  amLODipine (NORVASC) 10 MG tablet, Take 1 tablet (10 mg total) by mouth daily., Disp: 30 tablet, Rfl: 1 .  aspirin  EC 81 MG tablet, Take 81 mg by mouth daily., Disp: , Rfl:  .  atorvastatin (LIPITOR) 80 MG tablet, Take 1 tablet (80 mg total) by mouth daily at 6 PM., Disp: 30 tablet, Rfl: 1 .  azithromycin (ZITHROMAX Z-PAK) 250 MG tablet, Take as directed, Disp: 6 each, Rfl: 0 .  carvedilol (COREG) 25 MG tablet, Take 1 tablet (25 mg total) by mouth 2 (two) times daily with a meal., Disp: 60 tablet, Rfl: 0 .  Cholecalciferol 2000 UNITS CAPS, Take 2,000 Units by mouth daily. , Disp: , Rfl:  .  cloNIDine (CATAPRES) 0.3 MG tablet, Take 1 tablet (0.3 mg total) by mouth 2 (two) times daily., Disp: 60 tablet, Rfl: 1 .  clopidogrel (PLAVIX) 75 MG tablet, Take 1 tablet (75 mg total) by mouth daily., Disp: 30 tablet, Rfl: 0 .  colchicine 0.6 MG tablet, Take 1-2 tablets (0.6-1.2 mg total) by mouth daily., Disp: 30 tablet, Rfl: 3 .  fluticasone (FLONASE) 50 MCG/ACT nasal spray, Place 2 sprays into both nostrils daily as needed for allergies., Disp: 16 g, Rfl: 0 .  furosemide (LASIX) 40 MG tablet, Take 1 tablet (40 mg total) by mouth daily., Disp: 30 tablet, Rfl: 1 .  gabapentin (NEURONTIN) 600 MG tablet,  Take 600 mg by mouth 3 (three) times daily., Disp: , Rfl:  .  hydrALAZINE (APRESOLINE) 50 MG tablet, Take 1 tablet (50 mg total) by mouth 3 (three) times daily., Disp: 90 tablet, Rfl: 1 .  isosorbide dinitrate (ISORDIL) 20 MG tablet, Take 1 tablet (20 mg total) by mouth 3 (three) times daily., Disp: 60 tablet, Rfl: 0 .  nitroGLYCERIN (NITROSTAT) 0.4 MG SL tablet, Place 1 tablet (0.4 mg total) under the tongue every 5 (five) minutes as needed for chest pain., Disp: 25 tablet, Rfl: 2 .  omeprazole (PRILOSEC) 20 MG capsule, Take 1 capsule (20 mg total) by mouth daily before breakfast., Disp: 30 capsule, Rfl: 0 .  Oxycodone HCl 10 MG TABS, Take 1-2 tablets (10-20 mg total) by mouth 4 (four) times daily as needed (for pain). (Patient taking differently: Take 10 mg by mouth 4 (four) times daily. ), Disp: 60 tablet, Rfl: 0 .   potassium chloride (K-DUR) 10 MEQ tablet, Take 1 tablet (10 mEq total) by mouth daily., Disp: 30 tablet, Rfl: 1 .  sildenafil (REVATIO) 20 MG tablet, TAKE 2-5 TABLETS AS NEEDED PRIOR TO SEXUAL ACTIVITY (Patient taking differently: Take 40-100 mg by mouth daily as needed (sexual activity). ), Disp: 50 tablet, Rfl: 6 .  spironolactone (ALDACTONE) 50 MG tablet, Take 1 tablet (50 mg total) by mouth daily., Disp: 30 tablet, Rfl: 1  Assessment/ Plan: 53 y.o. male   1. Bronchitis, acute, with bronchospasm - azithromycin (ZITHROMAX Z-PAK) 250 MG tablet; Take as directed  Dispense: 6 each; Refill: 0 Has albuterol inhaler.  Encouraged him to use it 4 times a day to keep his airway open at this time.  And to carry the inhaler around with him over the next few weeks.  He may experience some acute bronchospasm because of this infection.  Start time: 10:47 AM End time: 10:54 AM  Meds ordered this encounter  Medications  . azithromycin (ZITHROMAX Z-PAK) 250 MG tablet    Sig: Take as directed    Dispense:  6 each    Refill:  0    Order Specific Question:   Supervising Provider    Answer:   Raliegh Ip [[id number]]    Prudy Feeler PA-C [facility] [phone]

## 2019-01-21 ENCOUNTER — Telehealth (INDEPENDENT_AMBULATORY_CARE_PROVIDER_SITE_OTHER): Payer: Medicaid Other | Admitting: Cardiology

## 2019-01-21 ENCOUNTER — Telehealth: Payer: Self-pay

## 2019-01-21 ENCOUNTER — Telehealth: Payer: Medicaid Other | Admitting: Cardiology

## 2019-01-21 ENCOUNTER — Other Ambulatory Visit: Payer: Self-pay

## 2019-01-21 ENCOUNTER — Encounter: Payer: Self-pay | Admitting: Cardiology

## 2019-01-21 ENCOUNTER — Telehealth: Payer: Self-pay | Admitting: Licensed Clinical Social Worker

## 2019-01-21 VITALS — BP 163/119 | HR 76 | Ht 68.0 in | Wt 350.0 lb

## 2019-01-21 DIAGNOSIS — I2510 Atherosclerotic heart disease of native coronary artery without angina pectoris: Secondary | ICD-10-CM | POA: Insufficient documentation

## 2019-01-21 MED ORDER — HYDRALAZINE HCL 100 MG PO TABS: 100.0000 mg | ORAL_TABLET | Freq: Three times a day (TID) | ORAL | 3 refills | Status: DC

## 2019-01-21 MED ORDER — VARENICLINE TARTRATE 0.5 MG PO TABS: 1.0000 mg | ORAL_TABLET | Freq: Two times a day (BID) | ORAL | 1 refills | Status: DC

## 2019-01-21 NOTE — Patient Instructions (Addendum)
Medication Instructions:  INCREASE: Hydralazine to 100 mg 3 times a day   If you need a refill on your cardiac medications before your next appointment, please call your pharmacy.   Lab work: FUTURE: BMET in 1 month   If you have labs (blood work) drawn today and your tests are completely normal, you will receive your results only by: Marland Kitchen. MyChart Message (if you have MyChart) OR . A paper copy in the mail If you have any lab test that is abnormal or we need to change your treatment, we will call you to review the results.  Testing/Procedures: None  Follow-Up: At [facility], you and your health needs are our priority.  As part of our continuing mission to provide you with exceptional heart care, we have created designated Provider Care Teams.  These Care Teams include your primary Cardiologist (physician) and Advanced Practice Providers (APPs -  Physician Assistants and Nurse Practitioners) who all work together to provide you with the care you need, when you need it. You will need a follow up appointment in:  1 months.  Please call our office 2 months in advance to schedule this appointment.  You may see Kristeen MissPhilip Nahser, MD or one of the following Advanced Practice Providers on your designated Care Team: Tereso NewcomerScott Weaver, PA-C Vin LoomisBhagat, [place] JerseyPA-C . Berton BonJanine Chief Walkup, NP  Any Other Special Instructions Will Be Listed Below (If Applicable).  Lifestyle Modifications to Prevent and Treat Heart Disease -Recommend heart healthy/Mediterranean diet, with whole grains, fruits, vegetable, fish, lean meats, nuts, and olive oil.  -Limit salt. -Recommend moderate walking, 3-5 times/week for 30-50 minutes each session. Aim for at least 150 minutes.week. Goal should be pace of 3 miles/hours, or walking 1.5 miles in 30 minutes -Recommend avoidance of tobacco products. Avoid excess alcohol.  Do the following things EVERY DAY:   1. Weigh yourself EVERY morning after you go to the bathroom but before you eat or  drink anything. Write this number down in a weight log/diary. If you gain 3 pounds overnight or 5 pounds in a week, call the office. 2. Take your medicines as prescribed. If you have concerns about your medications, please call us before you stop taking them.  3. Eat low salt foods-Limit salt (sodium) to 2000 mg per day. This will help prevent your body from holding onto fluid. Read food labels as many processed foods have a lot of sodium, especially canned goods and prepackaged meats. If you would like some assistance choosing low sodium foods, we would be happy to set you up with a nutritionist. 4. Stay as active as you can everyday. Staying active will give you more energy and make your muscles stronger. Start with 5 minutes at a time and work your way up to 30 minutes a day. Break up your activities--do some in the morning and some in the afternoon. Start with 3 days per week and work your way up to 5 days as you can.  If you have chest pain, feel short of breath, dizzy, or lightheaded, STOP. If you don't feel better after a short rest, call 911. If you do feel better, call the office to let us know you have symptoms with exercise. 5. Limit all fluids for the day to less than 2 liters. Fluid includes all drinks, coffee, juice, ice chips, soup, jello, and all other liquids.     Smoking Tobacco Information, Adult Smoking tobacco can be harmful to your health. Tobacco contains a poisonous (toxic), colorless chemical called  nicotine. Nicotine is addictive. It changes the brain and can make it hard to stop smoking. Tobacco also has other toxic chemicals that can hurt your body and raise your risk of many cancers. How can smoking tobacco affect me? Smoking tobacco puts you at risk for:  Cancer. Smoking is most commonly associated with lung cancer, but can also lead to cancer in other parts of the body.  Chronic obstructive pulmonary disease (COPD). This is a long-term lung condition that makes it hard  to breathe. It also gets worse over time.  High blood pressure (hypertension), heart disease, stroke, or heart attack.  Lung infections, such as pneumonia.  Cataracts. This is when the lenses in the eyes become clouded.  Digestive problems. This may include peptic ulcers, heartburn, and gastroesophageal reflux disease (GERD).  Oral health problems, such as gum disease and tooth loss.  Loss of taste and smell. Smoking can affect your appearance by causing:  Wrinkles.  Yellow or stained teeth, fingers, and fingernails. Smoking tobacco can also affect your social life, because:  It may be challenging to find places to smoke when away from home. Many workplaces, [employer], hotels, and public places are tobacco-free.  Smoking is expensive. This is due to the cost of tobacco and the long-term costs of treating health problems from smoking.  Secondhand smoke may affect those around you. Secondhand smoke can cause lung cancer, breathing problems, and heart disease. Children of smokers have a higher risk for: ? Sudden infant death syndrome (SIDS). ? Ear infections. ? Lung infections. If you currently smoke tobacco, quitting now can help you:  Lead a longer and healthier life.  Look, smell, breathe, and feel better over time.  Save money.  Protect others from the harms of secondhand smoke. What actions can I take to prevent health problems? Quit smoking   Do not start smoking. Quit if you already do.  Make a plan to quit smoking and commit to it. Look for programs to help you and ask your health care provider for recommendations and ideas.  Set a date and write down all the reasons you want to quit.  Let your friends and family know you are quitting so they can help and support you. Consider finding friends who also want to quit. It can be easier to quit with someone else, so that you can support each other.  Talk with your health care provider about using nicotine  replacement medicines to help you quit, such as gum, lozenges, patches, sprays, or pills.  Do not replace cigarette smoking with electronic cigarettes, which are commonly called e-cigarettes. The safety of e-cigarettes is not known, and some may contain harmful chemicals.  If you try to quit but return to smoking, stay positive. It is common to slip up when you first quit, so take it one day at a time.  Be prepared for cravings. When you feel the urge to smoke, chew gum or suck on hard candy. Lifestyle  Stay busy and take care of your body.  Drink enough fluid to keep your urine pale yellow.  Get plenty of exercise and eat a healthy diet. This can help prevent weight gain after quitting.  Monitor your eating habits. Quitting smoking can cause you to have a larger appetite than when you smoke.  Find ways to relax. Go out with friends or family to a movie or a restaurant where people do not smoke.  Ask your health care provider about having regular tests (screenings) to check for  cancer. This may include blood tests, imaging tests, and other tests.  Find ways to manage your stress, such as meditation, yoga, or exercise. Where to find support To get support to quit smoking, consider:  Asking your health care provider for more information and resources.  Taking classes to learn more about quitting smoking.  Looking for local organizations that offer resources about quitting smoking.  Joining a support group for people who want to quit smoking in your local community.  Calling the smokefree.gov counselor helpline: 1-800-Quit-Now (847) 020-2691) Where to find more information You may find more information about quitting smoking from:  HelpGuide.org: www.helpguide.org  BankRights.uy: smokefree.gov  American Lung Association: www.lung.org Contact a health care provider if you:  Have problems breathing.  Notice that your lips, nose, or fingers turn blue.  Have chest pain.   Are coughing up blood.  Feel faint or you pass out.  Have other health changes that cause you to worry. Summary  Smoking tobacco can negatively affect your health, the health of those around you, your finances, and your social life.  Do not start smoking. Quit if you already do. If you need help quitting, ask your health care provider.  Think about joining a support group for people who want to quit smoking in your local community. There are many effective programs that will help you to quit this behavior. This information is not intended to replace advice given to you by your health care provider. Make sure you discuss any questions you have with your health care provider. Document Released: 09/24/2016 Document Revised: 10/29/2017 Document Reviewed: 09/24/2016 Elsevier Interactive Patient Education  2019 [employer].    Mediterranean Diet A Mediterranean diet refers to food and lifestyle choices that are based on the traditions of countries located on the [place]. This way of eating has been shown to help prevent certain conditions and improve outcomes for people who have chronic diseases, like kidney disease and heart disease. What are tips for following this plan? Lifestyle  Cook and eat meals together with your family, when possible.  Drink enough fluid to keep your urine clear or pale yellow.  Be physically active every day. This includes: ? Aerobic exercise like running or swimming. ? Leisure activities like gardening, walking, or housework.  Get 7-8 hours of sleep each night.  If recommended by your health care provider, drink red wine in moderation. This means 1 glass a day for nonpregnant women and 2 glasses a day for men. A glass of wine equals 5 oz (150 mL). Reading food labels   Check the serving size of packaged foods. For foods such as rice and pasta, the serving size refers to the amount of cooked product, not dry.  Check the total fat in packaged  foods. Avoid foods that have saturated fat or trans fats.  Check the ingredients list for added sugars, such as corn syrup. Shopping  At the grocery store, buy most of your food from the areas near the walls of the store. This includes: ? Fresh fruits and vegetables (produce). ? Grains, beans, nuts, and seeds. Some of these may be available in unpackaged forms or large amounts (in bulk). ? Fresh seafood. ? Poultry and eggs. ? Low-fat dairy products.  Buy whole ingredients instead of prepackaged foods.  Buy fresh fruits and vegetables in-season from local farmers markets.  Buy frozen fruits and vegetables in resealable bags.  If you do not have access to quality fresh seafood, buy precooked frozen shrimp or canned  fish, such as tuna, salmon, or sardines.  Buy small amounts of raw or cooked vegetables, salads, or olives from the deli or salad bar at your store.  Stock your pantry so you always have certain foods on hand, such as olive oil, canned tuna, canned tomatoes, rice, pasta, and beans. Cooking  Cook foods with extra-virgin olive oil instead of using butter or other vegetable oils.  Have meat as a side dish, and have vegetables or grains as your main dish. This means having meat in small portions or adding small amounts of meat to foods like pasta or stew.  Use beans or vegetables instead of meat in common dishes like chili or lasagna.  Experiment with different cooking methods. Try roasting or broiling vegetables instead of steaming or sauteing them.  Add frozen vegetables to soups, stews, pasta, or rice.  Add nuts or seeds for added healthy fat at each meal. You can add these to yogurt, salads, or vegetable dishes.  Marinate fish or vegetables using olive oil, lemon juice, garlic, and fresh herbs. Meal planning   Plan to eat 1 vegetarian meal one day each week. Try to work up to 2 vegetarian meals, if possible.  Eat seafood 2 or more times a week.  Have healthy  snacks readily available, such as: ? Vegetable sticks with hummus. ? [place] yogurt. ? Fruit and nut trail mix.  Eat balanced meals throughout the week. This includes: ? Fruit: 2-3 servings a day ? Vegetables: 4-5 servings a day ? Low-fat dairy: 2 servings a day ? Fish, poultry, or lean meat: 1 serving a day ? Beans and legumes: 2 or more servings a week ? Nuts and seeds: 1-2 servings a day ? Whole grains: 6-8 servings a day ? Extra-virgin olive oil: 3-4 servings a day  Limit red meat and sweets to only a few servings a month What are my food choices?  Mediterranean diet ? Recommended ? Grains: Whole-grain pasta. Brown rice. Bulgar wheat. Polenta. Couscous. Whole-wheat bread. Orpah Cobb. ? Vegetables: Artichokes. Beets. Broccoli. Cabbage. Carrots. Eggplant. Green beans. Chard. Kale. Spinach. Onions. Leeks. Peas. Squash. Tomatoes. Peppers. Radishes. ? Fruits: Apples. Apricots. Avocado. Berries. Bananas. Cherries. Dates. Figs. Grapes. Lemons. Melon. Oranges. Peaches. Plums. Pomegranate. ? Meats and other protein foods: Beans. Almonds. Sunflower seeds. Pine nuts. Peanuts. Cod. Salmon. Scallops. Shrimp. Tuna. Tilapia. Clams. Oysters. Eggs. ? Dairy: Low-fat milk. Cheese. Greek yogurt. ? Beverages: Water. Red wine. Herbal tea. ? Fats and oils: Extra virgin olive oil. Avocado oil. Grape seed oil. ? Sweets and desserts: [place] yogurt with honey. Baked apples. Poached pears. Trail mix. ? Seasoning and other foods: Basil. Cilantro. Coriander. Cumin. Mint. Parsley. Sage. Rosemary. Tarragon. Garlic. Oregano. Thyme. Pepper. Balsalmic vinegar. Tahini. Hummus. Tomato sauce. Olives. Mushrooms. ? Limit these ? Grains: Prepackaged pasta or rice dishes. Prepackaged cereal with added sugar. ? Vegetables: Deep fried potatoes (french fries). ? Fruits: Fruit canned in syrup. ? Meats and other protein foods: Beef. Pork. Lamb. Poultry with skin. Hot dogs. Tomasa Blase. ? Dairy: Ice cream. Sour cream. Whole milk.  ? Beverages: Juice. Sugar-sweetened soft drinks. Beer. Liquor and spirits. ? Fats and oils: Butter. Canola oil. Vegetable oil. Beef fat (tallow). Lard. ? Sweets and desserts: Cookies. Cakes. Pies. Candy. ? Seasoning and other foods: Mayonnaise. Premade sauces and marinades. ? The items listed may not be a complete list. Talk with your dietitian about what dietary choices are right for you. Summary  The Mediterranean diet includes both food and lifestyle choices.  Eat a  variety of fresh fruits and vegetables, beans, nuts, seeds, and whole grains.  Limit the amount of red meat and sweets that you eat.  Talk with your health care provider about whether it is safe for you to drink red wine in moderation. This means 1 glass a day for nonpregnant women and 2 glasses a day for men. A glass of wine equals 5 oz (150 mL). This information is not intended to replace advice given to you by your health care provider. Make sure you discuss any questions you have with your health care provider. Document Released: 05/02/2016 Document Revised: 06/04/2016 Document Reviewed: 05/02/2016 Elsevier Interactive Patient Education  2019 [employer].  Coronavirus (COVID-19) Are you at risk?  Are you at risk for the Coronavirus (COVID-19)?  To be considered HIGH RISK for Coronavirus (COVID-19), you have to meet the following criteria:  . Traveled to [place], [place], [place], [place] or [place]; or in the [place] to [place], [place], [place], or [place]; and have fever, cough, and shortness of breath within the last 2 weeks of travel OR . Been in close contact with a person diagnosed with COVID-19 within the last 2 weeks and have fever, cough, and shortness of breath . IF YOU DO NOT MEET THESE CRITERIA, YOU ARE CONSIDERED LOW RISK FOR COVID-19.  What to do if you are HIGH RISK for COVID-19?  Marland Kitchen If you are having a medical emergency, call 911. . Seek medical care right away. Before you go to a  doctor's office, urgent care or emergency department, call ahead and tell them about your recent travel, contact with someone diagnosed with COVID-19, and your symptoms. You should receive instructions from your physician's office regarding next steps of care.  . When you arrive at healthcare provider, tell the healthcare staff immediately you have returned from visiting [place], [place], [place], [place] or [place]; or traveled in the [place] to [place], [place], [place], or [place]; in the last two weeks or you have been in close contact with a person diagnosed with COVID-19 in the last 2 weeks.   . Tell the health care staff about your symptoms: fever, cough and shortness of breath. . After you have been seen by a medical provider, you will be either: o Tested for (COVID-19) and discharged home on quarantine except to seek medical care if symptoms worsen, and asked to  - Stay home and avoid contact with others until you get your results (4-5 days)  - Avoid travel on public transportation if possible (such as bus, train, or airplane) or o Sent to the Emergency Department by EMS for evaluation, COVID-19 testing, and possible admission depending on your condition and test results.  What to do if you are LOW RISK for COVID-19?  Reduce your risk of any infection by using the same precautions used for avoiding the common cold or flu:  Marland Kitchen Wash your hands often with soap and warm water for at least 20 seconds.  If soap and water are not readily available, use an alcohol-based hand sanitizer with at least 60% alcohol.  . If coughing or sneezing, cover your mouth and nose by coughing or sneezing into the elbow areas of your shirt or coat, into a tissue or into your sleeve (not your hands). . Avoid shaking hands with others and consider head nods or verbal greetings only. . Avoid touching your eyes, nose, or mouth with unwashed hands.  . Avoid close contact with people who  are sick. . Avoid  places or events with large numbers of people in one location, like concerts or sporting events. . Carefully consider travel plans you have or are making. . If you are planning any travel outside or inside the [place], visit the CDC's Travelers' Health webpage for the latest health notices. . If you have some symptoms but not all symptoms, continue to monitor at home and seek medical attention if your symptoms worsen. . If you are having a medical emergency, call 911.   ADDITIONAL HEALTHCARE OPTIONS FOR PATIENTS  [facility] / e-Visit: [email or web address]         [facility]: [phone]  [facility]: [phone]                   [facility]: [phone]

## 2019-01-21 NOTE — Telephone Encounter (Signed)
CSW referred to contact patient to inquire about food insecurity during this health crisis. Patient reported need and agreeable to weekly delivery from [employer]. Patient informed that delivery will be left on front door with no face to face contact with delivery person. Patient agreeable to plan and grateful for the assistance. Jackie Simmie Garin, LCSW, CCSW-MCS [phone]  

## 2019-01-21 NOTE — Telephone Encounter (Signed)
YOUR CARDIOLOGY TEAM HAS ARRANGED FOR AN E-VISIT FOR YOUR APPOINTMENT - PLEASE REVIEW IMPORTANT INFORMATION BELOW SEVERAL DAYS PRIOR TO YOUR APPOINTMENT  Due to the recent COVID-19 pandemic, we are transitioning in-person office visits to tele-medicine visits in an effort to decrease unnecessary exposure to our patients, their families, and staff. These visits are billed to your insurance just like a normal visit is. We also encourage you to sign up for MyChart if you have not already done so. You will need a smartphone if possible. For patients that do not have this, we can still complete the visit using a regular telephone but do prefer a smartphone to enable video when possible. You may have a family member that lives with you that can help. If possible, we also ask that you have a blood pressure cuff and scale at home to measure your blood pressure, heart rate and weight prior to your scheduled appointment. Patients with clinical needs that need an in-person evaluation and testing will still be able to come to the office if absolutely necessary. If you have any questions, feel free to call our office.     YOUR PROVIDER WILL BE USING THE FOLLOWING PLATFORM TO COMPLETE YOUR VISIT: Doximity  . IF USING MYCHART - How to Download the MyChart App to Your SmartPhone   - If Apple, go to App Store and type in MyChart in the search bar and download the app. If Android, ask patient to go to Google Play Store and type in MyChart in the search bar and download the app. The app is free but as with any other app downloads, your phone may require you to verify saved payment information or Apple/Android password.  - You will need to then log into the app with your MyChart username and password, and select  as your healthcare provider to link the account.  - When it is time for your visit, go to the MyChart app, find appointments, and click Begin Video Visit. Be sure to Select Allow for your device to  access the Microphone and Camera for your visit. You will then be connected, and your provider will be with you shortly.  **If you have any issues connecting or need assistance, please contact MyChart service desk [phone] ([phone])**  **If using a computer, in order to ensure the best quality for your visit, you will need to use either of the following Internet Browsers: Google Chrome or Microsoft Edge**  . IF USING DOXIMITY or DOXY.ME - The staff will give you instructions on receiving your link to join the meeting the day of your visit.      2-3 DAYS BEFORE YOUR APPOINTMENT  You will receive a telephone call from one of our [facility] team members - your caller ID may say "Unknown caller." If this is a video visit, we will walk you through how to get the video launched on your phone. We will remind you check your blood pressure, heart rate and weight prior to your scheduled appointment. If you have an Apple Watch or Kardia, please upload any pertinent ECG strips the day before or morning of your appointment to MyChart. Our staff will also make sure you have reviewed the consent and agree to move forward with your scheduled tele-health visit.     THE DAY OF YOUR APPOINTMENT  Approximately 15 minutes prior to your scheduled appointment, you will receive a telephone call from one of HeartCare team - your caller ID may say "Unknown caller."    Our staff will confirm medications, vital signs for the day and any symptoms you may be experiencing. Please have this information available prior to the time of visit start. It may also be helpful for you to have a pad of paper and pen handy for any instructions given during your visit. They will also walk you through joining the smartphone meeting if this is a video visit.    CONSENT FOR TELE-HEALTH VISIT - PLEASE REVIEW  I hereby voluntarily request, consent and authorize [facility] and its employed or contracted physicians, physician  assistants, nurse practitioners or other licensed health care professionals (the Practitioner), to provide me with telemedicine health care services (the "Services") as deemed necessary by the treating Practitioner. I acknowledge and consent to receive the Services by the Practitioner via telemedicine. I understand that the telemedicine visit will involve communicating with the Practitioner through live audiovisual communication technology and the disclosure of certain medical information by electronic transmission. I acknowledge that I have been given the opportunity to request an in-person assessment or other available alternative prior to the telemedicine visit and am voluntarily participating in the telemedicine visit.  I understand that I have the right to withhold or withdraw my consent to the use of telemedicine in the course of my care at any time, without affecting my right to future care or treatment, and that the Practitioner or I may terminate the telemedicine visit at any time. I understand that I have the right to inspect all information obtained and/or recorded in the course of the telemedicine visit and may receive copies of available information for a reasonable fee.  I understand that some of the potential risks of receiving the Services via telemedicine include:  . Delay or interruption in medical evaluation due to technological equipment failure or disruption; . Information transmitted may not be sufficient (e.g. poor resolution of images) to allow for appropriate medical decision making by the Practitioner; and/or  . In rare instances, security protocols could fail, causing a breach of personal health information.  Furthermore, I acknowledge that it is my responsibility to provide information about my medical history, conditions and care that is complete and accurate to the best of my ability. I acknowledge that Practitioner's advice, recommendations, and/or decision may be based on  factors not within their control, such as incomplete or inaccurate data provided by me or distortions of diagnostic images or specimens that may result from electronic transmissions. I understand that the practice of medicine is not an exact science and that Practitioner makes no warranties or guarantees regarding treatment outcomes. I acknowledge that I will receive a copy of this consent concurrently upon execution via email to the email address I last provided but may also request a printed copy by calling the office of [facility].    I understand that my insurance will be billed for this visit.   I have read or had this consent read to me. . I understand the contents of this consent, which adequately explains the benefits and risks of the Services being provided via telemedicine.  . I have been provided ample opportunity to ask questions regarding this consent and the Services and have had my questions answered to my satisfaction. . I give my informed consent for the services to be provided through the use of telemedicine in my medical care  By participating in this telemedicine visit I agree to the above.  

## 2019-01-21 NOTE — Progress Notes (Signed)
Virtual Visit via Video Note   This visit type was conducted due to national recommendations for restrictions regarding the COVID-19 Pandemic (e.g. social distancing) in an effort to limit this patient's exposure and mitigate transmission in our community.  Due to his co-morbid illnesses, this patient is at least at moderate risk for complications without adequate follow up.  This format is felt to be most appropriate for this patient at this time.  All issues noted in this document were discussed and addressed.  A limited physical exam was performed with this format.  Please refer to the patient's chart for his consent to telehealth for [facility].   Evaluation Performed:  Follow-up visit  Date:  01/21/2019   ID:  Chris Powell, DOB 1965/11/30, MRN [id number]  Patient Location: Home Provider Location: Office  PCP:  Patient, No Pcp Per  Cardiologist:  Mertie Moores, MD  Electrophysiologist:  None   Chief Complaint:  Hospital follow up  History of Present Illness:    Chris Powell is a 53 y.o. male with with history of morbid obesity, hypertension, COPD, diabetes mellitus type 2, tobacco abuse was found unresponsive by EMS.  He was noted to have abnormal EKG.  UDS was positive for cocaine.  COVID testing was negative.  He underwent cardiac catheterization on 01/08/2019 and found to have severe coronary artery disease with high-grade lesion.  Echo showed EF of 40 to 45%.  He was not amenable for CABG therefore underwent repeat cardiac catheterization and stenting on 01/12/2019. He also had acute hypoxic respiratory failure, multifactorial and flash pulmonary edema secondary to uncontrolled hypertension. He improved with diuresis and was euvolemic on discharge.   Today he says he is doing well without shortness of breath, chest pain, edema, orthopnea or PND. He says he is no longer using cocaine. He admits that prior to his hospitalization he had not been taking his meds regularly. He  says that he is taking them as directed now. His BP is up today. He says that it usually runs 140/90-93. He says that he uses his CPAP every night although he does not like it.   His throat is still sore from the intubation but he is speaking and swallowing normally.   He feels that most of his issues are related to stress. He is trying to quit smoking, he is cutting down. Now at a little less than 1PPD. He says his brother used Chantix to quit and had success. He wants to try that.   He has bad sinus problems. He is not very active. He is trying to eat better and cut down on salt. He is worried about gaining weight as he stops smoking.   The patient does not have symptoms concerning for COVID-19 infection (fever, chills, cough, or new shortness of breath).    Past Medical History:  Diagnosis Date  . Anemia   . Arthritis   . Asthma   . Bone spur of ankle    right   . CHF (congestive heart failure) ([place])   . COPD (chronic obstructive pulmonary disease) ([place])   . Coronary artery disease   . Depression   . Diabetes ([place])    borderline   . GERD (gastroesophageal reflux disease)   . Hyperlipidemia   . Hypertension   . Myocardial infarction ([place])   . Pneumonia    HX OF PNA  . Right rotator cuff tear 01/06/2018  . Shortness of breath dyspnea   . Sleep apnea  USES CPAP   Past Surgical History:  Procedure Laterality Date  . CARDIAC CATHETERIZATION  12/22/2014  . CORONARY ANGIOPLASTY    . CORONARY STENT INTERVENTION N/A 01/12/2019   Procedure: CORONARY STENT INTERVENTION;  Surgeon: Sherren Mocha, MD;  Location: [place] [facility];  Service: Cardiovascular;  Laterality: N/A;  . CORONARY STENT PLACEMENT  12/22/2014   LAD  . KNEE SURGERY    . LEFT HEART CATH N/A 01/12/2019   Procedure: Left Heart Cath;  Surgeon: Sherren Mocha, MD;  Location: [place] [facility];  Service: Cardiovascular;  Laterality: N/A;  . LEFT HEART CATH AND CORONARY ANGIOGRAPHY N/A 12/16/2017   Procedure: LEFT  HEART CATH AND CORONARY ANGIOGRAPHY;  Surgeon: Martinique, Peter M, MD;  Location: [place] [facility];  Service: Cardiovascular;  Laterality: N/A;  . LEFT HEART CATHETERIZATION WITH CORONARY ANGIOGRAM N/A 12/22/2014   Procedure: LEFT HEART CATHETERIZATION WITH CORONARY ANGIOGRAM;  Surgeon: Jettie Booze, MD;  Location: [facility];  Service: Cardiovascular;  Laterality: N/A;  . RIGHT/LEFT HEART CATH AND CORONARY ANGIOGRAPHY N/A 01/08/2019   Procedure: RIGHT/LEFT HEART CATH AND CORONARY ANGIOGRAPHY;  Surgeon: Jolaine Artist, MD;  Location: [place] [facility];  Service: Cardiovascular;  Laterality: N/A;  . SHOULDER ARTHROSCOPY WITH ROTATOR CUFF REPAIR AND SUBACROMIAL DECOMPRESSION Right 01/06/2018   Procedure: RIGHT SHOULDER ARTHROSCOPY  DEBRIDEMENT,ACROMIOPLASTY, ROTATOR CUFF REPAIR;  Surgeon: Marchia Bond, MD;  Location: [place];  Service: Orthopedics;  Laterality: Right;     Current Meds  Medication Sig  . albuterol (PROVENTIL HFA;VENTOLIN HFA) 108 (90 Base) MCG/ACT inhaler Inhale 2 puffs into the lungs every 6 (six) hours as needed for wheezing or shortness of breath.  Marland Kitchen amLODipine (NORVASC) 10 MG tablet Take 1 tablet (10 mg total) by mouth daily.  Marland Kitchen aspirin EC 81 MG tablet Take 81 mg by mouth daily.  Marland Kitchen atorvastatin (LIPITOR) 80 MG tablet Take 1 tablet (80 mg total) by mouth daily at 6 PM.  . azithromycin (ZITHROMAX Z-PAK) 250 MG tablet Take as directed  . carvedilol (COREG) 25 MG tablet Take 1 tablet (25 mg total) by mouth 2 (two) times daily with a meal.  . Cholecalciferol 2000 UNITS CAPS Take 2,000 Units by mouth daily.   . cloNIDine (CATAPRES) 0.3 MG tablet Take 1 tablet (0.3 mg total) by mouth 2 (two) times daily.  . clopidogrel (PLAVIX) 75 MG tablet Take 1 tablet (75 mg total) by mouth daily.  . colchicine 0.6 MG tablet Take 1-2 tablets (0.6-1.2 mg total) by mouth daily.  . fluticasone (FLONASE) 50 MCG/ACT nasal spray Place 2 sprays into both nostrils daily as needed for allergies.   . furosemide (LASIX) 40 MG tablet Take 1 tablet (40 mg total) by mouth daily.  Marland Kitchen gabapentin (NEURONTIN) 600 MG tablet Take 600 mg by mouth 3 (three) times daily.  . hydrALAZINE (APRESOLINE) 50 MG tablet Take 1 tablet (50 mg total) by mouth 3 (three) times daily.  . isosorbide dinitrate (ISORDIL) 20 MG tablet Take 1 tablet (20 mg total) by mouth 3 (three) times daily.  . nitroGLYCERIN (NITROSTAT) 0.4 MG SL tablet Place 1 tablet (0.4 mg total) under the tongue every 5 (five) minutes as needed for chest pain.  Marland Kitchen omeprazole (PRILOSEC) 20 MG capsule Take 1 capsule (20 mg total) by mouth daily before breakfast.  . Oxycodone HCl 10 MG TABS Take 1-2 tablets (10-20 mg total) by mouth 4 (four) times daily as needed (for pain). (Patient taking differently: Take 10 mg by mouth 4 (four) times  daily. )  . potassium chloride (K-DUR) 10 MEQ tablet Take 1 tablet (10 mEq total) by mouth daily.  . sildenafil (REVATIO) 20 MG tablet TAKE 2-5 TABLETS AS NEEDED PRIOR TO SEXUAL ACTIVITY (Patient taking differently: Take 40-100 mg by mouth daily as needed (sexual activity). )  . spironolactone (ALDACTONE) 50 MG tablet Take 1 tablet (50 mg total) by mouth daily.     Allergies:   Patient has no known allergies.   Social History   Tobacco Use  . Smoking status: Current Every Day Smoker    Packs/day: 1.00    Years: 30.00    Pack years: 30.00    Types: Cigarettes  . Smokeless tobacco: Never Used  . Tobacco comment: " I have  tried vapor cigarettes"  Substance Use Topics  . Alcohol use: Yes    Alcohol/week: 0.0 standard drinks    Comment: occ beer   . Drug use: Yes    Types: Cocaine    Comment: positive on admission 4/15     Family Hx: The patient's family history includes Cancer in his mother and sister; Diabetes in his father; Heart attack in his father; Hyperlipidemia in his mother; Hypertension in his mother. There is no history of Colon cancer.  ROS:   Please see the history of present illness.     All  other systems reviewed and are negative.   Prior CV studies:   The following studies were reviewed today:  RIGHT/LEFT HEART CATH AND CORONARY ANGIOGRAPHY 01/08/19  Conclusion    Non-stenotic Prox LAD-1 lesion was previously treated.  Prox LAD-2 lesion is 50% stenosed.  1st Mrg lesion is 80% stenosed.  Prox RCA to Mid RCA lesion is 99% stenosed.  Mid RCA lesion is 30% stenosed.  Ost 1st Mrg lesion is 90% stenosed.  Ost LAD to Prox LAD lesion is 90% stenosed.  Mid LAD to Dist LAD lesion is 60% stenosed.  Findings: Ao = 170/102 (123) LV = 170/24 RA = 12 RV = 62/19 PA = 61/26 (40) PCW = 32 with v waves to 40 Fick cardiac output/index = 7.3/2.9 PVR = 1.0 WU FA sat = 94% PA sat = 69%, 69%  Assessment: 1. 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 Volume overload with moderate pulmonary venous HTN  Plan/Discussion: Given morbid obesity and diffuse distal LAD disease not candidate for CABG. Films reviewed with Dr. Burt Knack. Plan PCI of RCA and LAD on Tuesday. Will diurese. Watch creatinine closely. Load Plavix.   Glori Bickers, MD 5:30 PM    Echo 01/06/2019  1. The left ventricle has mild-moderately reduced systolic function, with an ejection fraction of 40-45%. The cavity size was normal. There is mildly increased left ventricular wall thickness. Left ventricular diastolic parameters were normal. Left  ventricular diffuse hypokinesis. 2. The right ventricle has normal systolic function. The cavity was normal. There is no increase in right ventricular wall thickness. 3. Left atrial size was mildly dilated. 4. The aortic valve is tricuspid. 5. The inferior vena cava was dilated in size with >50% respiratory variability.   CORONARY STENT INTERVENTION 01/12/19  Left Heart Cath   Successful 2 vessel PCI with stenting of the mid-LAD (2.75x16  mm Synergy DES), PTCA of severe ISR of the proximal LAD (3.0 mm Madill balloon 20 atm), and stenting of the proximal RCA (3.5x20 mm Synergy DES)  Recommend: aggressive risk factor modification,  DAPT with ASA and clopidogrel x minimum 12 months. NTG drip initiated in cath lab to help control severe HTN. Anticipate DC home tomorrow if stable    Labs/Other Tests and Data Reviewed:    EKG:  An ECG dated 01/13/2019 was personally reviewed today and demonstrated:  sinsu rhythm with inferior and lateral T wave abnormalities.   Recent Labs: 01/06/2019: ALT 37; B Natriuretic Peptide 431.5 01/13/2019: BUN 17; Creatinine, Ser 1.20; Hemoglobin 12.5; Magnesium 2.2; Platelets 254; Potassium 4.1; Sodium 136   Recent Lipid Panel Lab Results  Component Value Date/Time   CHOL 130 01/13/2019 02:22 AM   CHOL 117 12/09/2017 08:12 AM   TRIG 66 01/13/2019 02:22 AM   HDL 43 01/13/2019 02:22 AM   HDL 35 (L) 12/09/2017 08:12 AM   CHOLHDL 3.0 01/13/2019 02:22 AM   LDLCALC 74 01/13/2019 02:22 AM   LDLCALC 67 12/09/2017 08:12 AM    Wt Readings from Last 3 Encounters:  01/21/19 (!) 350 lb (158.8 kg)  01/13/19 (!) 350 lb 15.6 oz (159.2 kg)  11/13/18 (!) 342 lb (155.1 kg)     Objective:    Vital Signs:  BP (!) 163/119 Comment: before medication.  Pulse 76   Ht _0  (1.727 m)   Wt (!) 350 lb (158.8 kg)   BMI 53.22 kg/m    VITAL SIGNS:  reviewed GEN:  no acute distress RESPIRATORY:  normal respiratory effort, symmetric expansion CARDIOVASCULAR:  no peripheral edema NEURO:  alert and oriented x 3, no obvious focal deficit PSYCH:  normal affect  ASSESSMENT & PLAN:    1. Severe coronary artery disease -Pt found unresponsive by EMS, abnormal EKG, positive for cocaine, negative for COVID.  -Status post cardiac catheterization on 01/08/2019 which showed severe CAD but not amenable to CABG therefore repeat cardiac catheterization was done on 01/12/2019 and subsequent stenting of the RCA and mid LAD. -Now on  dual antiplatelet therapy with aspirin and Plavix for minimum of 12 months. Continues on statin and beta-blocker. -No current anginal symptoms. -Reviewed lifestyle modification and continued cocaine cessation.   Lifestyle Modifications to Prevent and Treat Heart Disease -Recommend heart healthy/Mediterranean diet, with whole grains, fruits, vegetable, fish, lean meats, nuts, and olive oil. Limit salt. -Recommend moderate walking, 3-5 times/week for 30-50 minutes each session. Aim for at least 150 minutes.week. Goal should be pace of 3 miles/hours, or walking 1.5 miles in 30 minutes -Recommend avoidance of tobacco products. Avoid excess alcohol.  2. Hypertension -Lisinopril on hold due to renal function. Consider resuming at next visit if renal function ok. On amlodipine, carvedilol, hydralazine, clonidine, isordil. BP still not optimally controlled in the 140's/90's. Will increase hydralazine to 100 mg TID.  -Pt advised on wt loss, limiting salt.   3. Tobacco abuse -Trying to quit, now just under 1 PPD.  -Will provide Chantix as requested. We also discussed behavior changes to help with successful cessation.   4. Acute kidney injury -Occurred in hospital related to poor perfusion. Improved by discharge. Lisinopril on hold. Consider resuming at next visit if renal function OK.   5. OSA -Using CPAP consistently  6. Obesity -Body mass index is 53.22 kg/m. -Advised on wt loss with diet, portion control, exercise.   7. Hyperlipidemia with LDL goal <70 -LDL was 74 on 01/13/19. Continue high intensity statin with atorvastatin 80 mg daily.   COVID-19 Education: The signs and symptoms of COVID-19 were discussed with the patient and how to seek care for testing (follow up with PCP or arrange  E-visit).  The importance of social distancing was discussed today.  Time:   Today, I have spent 25 minutes with the patient with telehealth technology discussing the above problems.     Medication  Adjustments/Labs and Tests Ordered: Current medicines are reviewed at length with the patient today.  Concerns regarding medicines are outlined above.   Tests Ordered: No orders of the defined types were placed in this encounter.   Medication Changes: No orders of the defined types were placed in this encounter.   Disposition:  Follow up in 1 month(s)  Signed, Daune Perch, NP  01/21/2019 2:21 PM    [facility]

## 2019-01-21 NOTE — Addendum Note (Signed)
Addended by: Vernard Gambles on: 01/21/2019 04:30 PM   Modules accepted: Orders

## 2019-01-22 ENCOUNTER — Telehealth: Payer: Medicaid Other | Admitting: Cardiology

## 2019-01-29 ENCOUNTER — Telehealth: Payer: Self-pay | Admitting: Licensed Clinical Social Worker

## 2019-01-29 ENCOUNTER — Telehealth: Payer: Self-pay | Admitting: Cardiology

## 2019-01-29 NOTE — Telephone Encounter (Signed)
°  Appointment made with Chris Powell for 5/27 Video visit, smartphone

## 2019-01-29 NOTE — Telephone Encounter (Signed)
CSW contacted patient to follow up on weekly food delivery package. Patient informed of delivery time and no face to face contact during delivery. Patient verbalizes understanding and grateful for the assistance.  CSW continues to follow for supportive needs. Jackie Ivi Griffith, LCSW, CCSW-MCS [phone]  

## 2019-01-29 NOTE — Telephone Encounter (Signed)
-----   Message from Allayne Butcher, [place] sent at 01/28/2019  1:27 PM EDT ----- Regarding: Needs Tele Visit and REMOVE From New PT referal queue Arrange pt for f/u telehealth visit (established pt) with Dr. Melburn Popper or Ms. Hammond in 4 weeks. Please call to set up and also remove from new referal queue.

## 2019-02-02 ENCOUNTER — Other Ambulatory Visit: Payer: Self-pay | Admitting: Cardiovascular Disease

## 2019-02-05 ENCOUNTER — Telehealth: Payer: Self-pay | Admitting: Licensed Clinical Social Worker

## 2019-02-05 NOTE — Telephone Encounter (Signed)
CSW contacted patient to follow up on weekly food delivery package. Patient informed of delivery time and no face to face contact during delivery. Message left as no answer.  CSW continues to follow for supportive needs. Jackie Darnella Zeiter, LCSW, CCSW-MCS [phone] 

## 2019-02-11 ENCOUNTER — Telehealth: Payer: Self-pay | Admitting: Licensed Clinical Social Worker

## 2019-02-11 NOTE — Telephone Encounter (Signed)
CSW contacted patient to follow up on weekly food delivery package. Patient informed of change in delivery day due to Memorial Day to Wednesday and no face to face contact during delivery. Patient verbalizes understanding and grateful for the assistance.  CSW continues to follow for supportive needs. Jackie Clytee Heinrich, LCSW, CCSW-MCS [phone]  

## 2019-02-17 ENCOUNTER — Encounter: Payer: Self-pay | Admitting: Cardiology

## 2019-02-17 ENCOUNTER — Other Ambulatory Visit: Payer: Self-pay

## 2019-02-17 ENCOUNTER — Other Ambulatory Visit: Payer: Self-pay | Admitting: Physician Assistant

## 2019-02-17 ENCOUNTER — Telehealth (INDEPENDENT_AMBULATORY_CARE_PROVIDER_SITE_OTHER): Payer: Medicaid Other | Admitting: Cardiology

## 2019-02-17 VITALS — BP 152/79 | HR 69 | Ht 67.5 in | Wt 334.0 lb

## 2019-02-17 DIAGNOSIS — Z9989 Dependence on other enabling machines and devices: Secondary | ICD-10-CM | POA: Insufficient documentation

## 2019-02-17 DIAGNOSIS — I10 Essential (primary) hypertension: Secondary | ICD-10-CM

## 2019-02-17 DIAGNOSIS — N179 Acute kidney failure, unspecified: Secondary | ICD-10-CM

## 2019-02-17 DIAGNOSIS — I2510 Atherosclerotic heart disease of native coronary artery without angina pectoris: Secondary | ICD-10-CM | POA: Diagnosis not present

## 2019-02-17 DIAGNOSIS — J441 Chronic obstructive pulmonary disease with (acute) exacerbation: Secondary | ICD-10-CM

## 2019-02-17 DIAGNOSIS — G4733 Obstructive sleep apnea (adult) (pediatric): Secondary | ICD-10-CM | POA: Insufficient documentation

## 2019-02-17 DIAGNOSIS — F17200 Nicotine dependence, unspecified, uncomplicated: Secondary | ICD-10-CM

## 2019-02-17 MED ORDER — CLOPIDOGREL BISULFATE 75 MG PO TABS: 75.0000 mg | ORAL_TABLET | Freq: Every day | ORAL | 3 refills | Status: DC

## 2019-02-17 NOTE — Patient Instructions (Addendum)
Medication Instructions:  Your physician recommends that you continue on your current medications as directed. Please refer to the Current Medication list given to you today.  If you need a refill on your cardiac medications before your next appointment, please call your pharmacy.   Lab work: FUTURE: Nutritional therapist (Too be done at [employer] office)  If you have labs (blood work) drawn today and your tests are completely normal, you will receive your results only by: Marland Kitchen MyChart Message (if you have MyChart) OR . A paper copy in the mail If you have any lab test that is abnormal or we need to change your treatment, we will call you to review the results.  Testing/Procedures: None   Follow-Up: At [facility], you and your health needs are our priority.  As part of our continuing mission to provide you with exceptional heart care, we have created designated Provider Care Teams.  These Care Teams include your primary Cardiologist (physician) and Advanced Practice Providers (APPs -  Physician Assistants and Nurse Practitioners) who all work together to provide you with the care you need, when you need it. You will need a follow up appointment in:  3-4 months.  Please call our office 2 months in advance to schedule this appointment.  You may see Kristeen Miss, MD or one of the following Advanced Practice Providers on your designated Care Team: Tereso Newcomer, PA-C [place], [place] . Berton Bon, NP  Any Other Special Instructions Will Be Listed Below (If Applicable).   Goal blood pressure is less than 130/80.     DASH Eating Plan DASH stands for "Dietary Approaches to Stop Hypertension." The DASH eating plan is a healthy eating plan that has been shown to reduce high blood pressure (hypertension). It may also reduce your risk for type 2 diabetes, heart disease, and stroke. The DASH eating plan may also help with weight loss. What are tips for following this plan?  General guidelines  Avoid  eating more than 2,300 mg (milligrams) of salt (sodium) a day. If you have hypertension, you may need to reduce your sodium intake to 1,500 mg a day.  Limit alcohol intake to no more than 1 drink a day for nonpregnant women and 2 drinks a day for men. One drink equals 12 oz of beer, 5 oz of wine, or 1 oz of hard liquor.  Work with your health care provider to maintain a healthy body weight or to lose weight. Ask what an ideal weight is for you.  Get at least 30 minutes of exercise that causes your heart to beat faster (aerobic exercise) most days of the week. Activities may include walking, swimming, or biking.  Work with your health care provider or diet and nutrition specialist (dietitian) to adjust your eating plan to your individual calorie needs. Reading food labels   Check food labels for the amount of sodium per serving. Choose foods with less than 5 percent of the Daily Value of sodium. Generally, foods with less than 300 mg of sodium per serving fit into this eating plan.  To find whole grains, look for the word "whole" as the first word in the ingredient list. Shopping  Buy products labeled as "low-sodium" or "no salt added."  Buy fresh foods. Avoid canned foods and premade or frozen meals. Cooking  Avoid adding salt when cooking. Use salt-free seasonings or herbs instead of table salt or sea salt. Check with your health care provider or pharmacist before using salt substitutes.  Do  not fry foods. Cook foods using healthy methods such as baking, boiling, grilling, and broiling instead.  Cook with heart-healthy oils, such as olive, canola, soybean, or sunflower oil. Meal planning  Eat a balanced diet that includes: ? 5 or more servings of fruits and vegetables each day. At each meal, try to fill half of your plate with fruits and vegetables. ? Up to 6-8 servings of whole grains each day. ? Less than 6 oz of lean meat, poultry, or fish each day. A 3-oz serving of meat is  about the same size as a deck of cards. One egg equals 1 oz. ? 2 servings of low-fat dairy each day. ? A serving of nuts, seeds, or beans 5 times each week. ? Heart-healthy fats. Healthy fats called Omega-3 fatty acids are found in foods such as flaxseeds and coldwater fish, like sardines, salmon, and mackerel.  Limit how much you eat of the following: ? Canned or prepackaged foods. ? Food that is high in trans fat, such as fried foods. ? Food that is high in saturated fat, such as fatty meat. ? Sweets, desserts, sugary drinks, and other foods with added sugar. ? Full-fat dairy products.  Do not salt foods before eating.  Try to eat at least 2 vegetarian meals each week.  Eat more home-cooked food and less restaurant, buffet, and fast food.  When eating at a restaurant, ask that your food be prepared with less salt or no salt, if possible. What foods are recommended? The items listed may not be a complete list. Talk with your dietitian about what dietary choices are best for you. Grains Whole-grain or whole-wheat bread. Whole-grain or whole-wheat pasta. Brown rice. Orpah Cobb. Bulgur. Whole-grain and low-sodium cereals. Pita bread. Low-fat, low-sodium crackers. Whole-wheat flour tortillas. Vegetables Fresh or frozen vegetables (raw, steamed, roasted, or grilled). Low-sodium or reduced-sodium tomato and vegetable juice. Low-sodium or reduced-sodium tomato sauce and tomato paste. Low-sodium or reduced-sodium canned vegetables. Fruits All fresh, dried, or frozen fruit. Canned fruit in natural juice (without added sugar). Meat and other protein foods Skinless chicken or [place]. Ground chicken or [place]. Pork with fat trimmed off. Fish and seafood. Egg whites. Dried beans, peas, or lentils. Unsalted nuts, nut butters, and seeds. Unsalted canned beans. Lean cuts of beef with fat trimmed off. Low-sodium, lean deli meat. Dairy Low-fat (1%) or fat-free (skim) milk. Fat-free, low-fat, or  reduced-fat cheeses. Nonfat, low-sodium ricotta or cottage cheese. Low-fat or nonfat yogurt. Low-fat, low-sodium cheese. Fats and oils Soft margarine without trans fats. Vegetable oil. Low-fat, reduced-fat, or light mayonnaise and salad dressings (reduced-sodium). Canola, safflower, olive, soybean, and sunflower oils. Avocado. Seasoning and other foods Herbs. Spices. Seasoning mixes without salt. Unsalted popcorn and pretzels. Fat-free sweets. What foods are not recommended? The items listed may not be a complete list. Talk with your dietitian about what dietary choices are best for you. Grains Baked goods made with fat, such as croissants, muffins, or some breads. Dry pasta or rice meal packs. Vegetables Creamed or fried vegetables. Vegetables in a cheese sauce. Regular canned vegetables (not low-sodium or reduced-sodium). Regular canned tomato sauce and paste (not low-sodium or reduced-sodium). Regular tomato and vegetable juice (not low-sodium or reduced-sodium). Rosita Fire. Olives. Fruits Canned fruit in a light or heavy syrup. Fried fruit. Fruit in cream or butter sauce. Meat and other protein foods Fatty cuts of meat. Ribs. Fried meat. Tomasa Blase. Sausage. Bologna and other processed lunch meats. Salami. Fatback. Hotdogs. Bratwurst. Salted nuts and seeds. Canned beans with added  salt. Canned or smoked fish. Whole eggs or egg yolks. Chicken or [place] with skin. Dairy Whole or 2% milk, cream, and half-and-half. Whole or full-fat cream cheese. Whole-fat or sweetened yogurt. Full-fat cheese. Nondairy creamers. Whipped toppings. Processed cheese and cheese spreads. Fats and oils Butter. Stick margarine. Lard. Shortening. Ghee. Bacon fat. Tropical oils, such as coconut, palm kernel, or palm oil. Seasoning and other foods Salted popcorn and pretzels. Onion salt, garlic salt, seasoned salt, table salt, and sea salt. Worcestershire sauce. Tartar sauce. Barbecue sauce. Teriyaki sauce. Soy sauce, including  reduced-sodium. Steak sauce. Canned and packaged gravies. Fish sauce. Oyster sauce. Cocktail sauce. Horseradish that you find on the shelf. Ketchup. Mustard. Meat flavorings and tenderizers. Bouillon cubes. Hot sauce and Tabasco sauce. Premade or packaged marinades. Premade or packaged taco seasonings. Relishes. Regular salad dressings. Where to find more information:  National Heart, Lung, and Blood Institute: PopSteam.is  American Heart Association: www.heart.org Summary  The DASH eating plan is a healthy eating plan that has been shown to reduce high blood pressure (hypertension). It may also reduce your risk for type 2 diabetes, heart disease, and stroke.  With the DASH eating plan, you should limit salt (sodium) intake to 2,300 mg a day. If you have hypertension, you may need to reduce your sodium intake to 1,500 mg a day.  When on the DASH eating plan, aim to eat more fresh fruits and vegetables, whole grains, lean proteins, low-fat dairy, and heart-healthy fats.  Work with your health care provider or diet and nutrition specialist (dietitian) to adjust your eating plan to your individual calorie needs. This information is not intended to replace advice given to you by your health care provider. Make sure you discuss any questions you have with your health care provider. Document Released: 08/29/2011 Document Revised: 09/02/2016 Document Reviewed: 09/02/2016 Elsevier Interactive Patient Education  2019 Elsevier Inc. =================================================================   Smoking Tobacco Information, Adult Smoking tobacco can be harmful to your health. Tobacco contains a poisonous (toxic), colorless chemical called nicotine. Nicotine is addictive. It changes the brain and can make it hard to stop smoking. Tobacco also has other toxic chemicals that can hurt your body and raise your risk of many cancers. How can smoking tobacco affect me? Smoking tobacco puts you at  risk for:  Cancer. Smoking is most commonly associated with lung cancer, but can also lead to cancer in other parts of the body.  Chronic obstructive pulmonary disease (COPD). This is a long-term lung condition that makes it hard to breathe. It also gets worse over time.  High blood pressure (hypertension), heart disease, stroke, or heart attack.  Lung infections, such as pneumonia.  Cataracts. This is when the lenses in the eyes become clouded.  Digestive problems. This may include peptic ulcers, heartburn, and gastroesophageal reflux disease (GERD).  Oral health problems, such as gum disease and tooth loss.  Loss of taste and smell. Smoking can affect your appearance by causing:  Wrinkles.  Yellow or stained teeth, fingers, and fingernails. Smoking tobacco can also affect your social life, because:  It may be challenging to find places to smoke when away from home. Many workplaces, [employer], hotels, and public places are tobacco-free.  Smoking is expensive. This is due to the cost of tobacco and the long-term costs of treating health problems from smoking.  Secondhand smoke may affect those around you. Secondhand smoke can cause lung cancer, breathing problems, and heart disease. Children of smokers have a higher risk for: ? Sudden infant death  syndrome (SIDS). ? Ear infections. ? Lung infections. If you currently smoke tobacco, quitting now can help you:  Lead a longer and healthier life.  Look, smell, breathe, and feel better over time.  Save money.  Protect others from the harms of secondhand smoke. What actions can I take to prevent health problems? Quit smoking   Do not start smoking. Quit if you already do.  Make a plan to quit smoking and commit to it. Look for programs to help you and ask your health care provider for recommendations and ideas.  Set a date and write down all the reasons you want to quit.  Let your friends and family know you are  quitting so they can help and support you. Consider finding friends who also want to quit. It can be easier to quit with someone else, so that you can support each other.  Talk with your health care provider about using nicotine replacement medicines to help you quit, such as gum, lozenges, patches, sprays, or pills.  Do not replace cigarette smoking with electronic cigarettes, which are commonly called e-cigarettes. The safety of e-cigarettes is not known, and some may contain harmful chemicals.  If you try to quit but return to smoking, stay positive. It is common to slip up when you first quit, so take it one day at a time.  Be prepared for cravings. When you feel the urge to smoke, chew gum or suck on hard candy. Lifestyle  Stay busy and take care of your body.  Drink enough fluid to keep your urine pale yellow.  Get plenty of exercise and eat a healthy diet. This can help prevent weight gain after quitting.  Monitor your eating habits. Quitting smoking can cause you to have a larger appetite than when you smoke.  Find ways to relax. Go out with friends or family to a movie or a restaurant where people do not smoke.  Ask your health care provider about having regular tests (screenings) to check for cancer. This may include blood tests, imaging tests, and other tests.  Find ways to manage your stress, such as meditation, yoga, or exercise. Where to find support To get support to quit smoking, consider:  Asking your health care provider for more information and resources.  Taking classes to learn more about quitting smoking.  Looking for local organizations that offer resources about quitting smoking.  Joining a support group for people who want to quit smoking in your local community.  Calling the smokefree.gov counselor helpline: 1-800-Quit-Now (848)405-7636(1-(765)227-7105) Where to find more information You may find more information about quitting smoking from:  HelpGuide.org:  www.helpguide.org  BankRights.uySmokefree.gov: smokefree.gov  American Lung Association: www.lung.org Contact a health care provider if you:  Have problems breathing.  Notice that your lips, nose, or fingers turn blue.  Have chest pain.  Are coughing up blood.  Feel faint or you pass out.  Have other health changes that cause you to worry. Summary  Smoking tobacco can negatively affect your health, the health of those around you, your finances, and your social life.  Do not start smoking. Quit if you already do. If you need help quitting, ask your health care provider.  Think about joining a support group for people who want to quit smoking in your local community. There are many effective programs that will help you to quit this behavior. This information is not intended to replace advice given to you by your health care provider. Make sure you discuss any  questions you have with your health care provider. Document Released: 09/24/2016 Document Revised: 10/29/2017 Document Reviewed: 09/24/2016 Elsevier Interactive Patient Education  2019 [employer].

## 2019-02-17 NOTE — Telephone Encounter (Signed)
If I am not mistaken, he was Dr. Nadine Counts at the discharge. I only saw him for a same day problem visit

## 2019-02-17 NOTE — Telephone Encounter (Signed)
He was Dr Delynn Flavin at the discharge time.  She sis the letter. I only did a same day problem visit.

## 2019-02-17 NOTE — Progress Notes (Signed)
Virtual Visit via Video Note   This visit type was conducted due to national recommendations for restrictions regarding the COVID-19 Pandemic (e.g. social distancing) in an effort to limit this patient's exposure and mitigate transmission in our community.  Due to his co-morbid illnesses, this patient is at least at moderate risk for complications without adequate follow up.  This format is felt to be most appropriate for this patient at this time.  All issues noted in this document were discussed and addressed.  A limited physical exam was performed with this format.  Please refer to the patient's chart for his consent to telehealth for [facility].   Date:  02/17/2019   ID:  Chris Powell, DOB 1965/11/03, MRN [id number]  Patient Location: Home Provider Location: Home  PCP:  Patient, No Pcp Per  Cardiologist:  Mertie Moores, MD  Electrophysiologist:  None   Evaluation Performed:  Follow-Up Visit  Chief Complaint:  Follow up CAD, Hypertension  History of Present Illness:    Chris Powell is a 53 y.o. male with history of morbid obesity, hypertension, COPD, diabetes mellitus type 2, tobacco abuse was found unresponsive by EMS. He was noted to have abnormal EKG. UDS was positive for cocaine. COVID testing was negative. He underwent cardiac catheterization on 01/08/2019 and found to have severe coronary artery disease with high-grade lesion. Echo showed EF of 40 to 45%. He was not amenable for CABG therefore underwent repeat cardiac catheterization and stenting on 01/12/2019. He also had acute hypoxic respiratory failure, multifactorial and flash pulmonary edema secondary to uncontrolled hypertension. He improved with diuresis and was euvolemic on discharge.   I saw him by telemedicine for hospital follow up on 01/21/2019. He said that he was no linger using cocaine and he was taking his cardiac meds regularly, which he had not been doing prior to his event. He was working on smoking  cessation and trying to use his CPAP regularly although he did not like it.   Today he says he's been stressed out due to living in the [place] of [place]. His brother has lung cancer and has had ~6 surgeries in the last 1.5 yrs. He is staying around to take care of him. He gets out shopping mainly just to walk around. He does not other exercise or walking as he does not feel safe to walk around his house.   He was unable to pick up Chantix due to insurance not paying. Pharmacy was going to try to get sample packs. He is eating peppermint candy to help. He mostly smokes about 1/2 PPD, more when he is stressed. He has a plan to help him reduce his smoking.   He has been receiving meal delivery. He says he can tell that the food is heart healthy, has no salt. He is using Mrs. Dash.   He denies any cocaine use since hospital. He says that it is not easy.   He says that he is compliant with CPAP.   The patient does not have symptoms concerning for COVID-19 infection (fever, chills, cough, or new shortness of breath).    Past Medical History:  Diagnosis Date  . Anemia   . Arthritis   . Asthma   . Bone spur of ankle    right   . CHF (congestive heart failure) ([place])   . COPD (chronic obstructive pulmonary disease) ([place])   . Coronary artery disease   . Depression   . Diabetes ([place])    borderline   .  GERD (gastroesophageal reflux disease)   . Hyperlipidemia   . Hypertension   . Myocardial infarction ([place])   . Pneumonia    HX OF PNA  . Right rotator cuff tear 01/06/2018  . Shortness of breath dyspnea   . Sleep apnea    USES CPAP   Past Surgical History:  Procedure Laterality Date  . CARDIAC CATHETERIZATION  12/22/2014  . CORONARY ANGIOPLASTY    . CORONARY STENT INTERVENTION N/A 01/12/2019   Procedure: CORONARY STENT INTERVENTION;  Surgeon: Sherren Mocha, MD;  Location: [place] [facility];  Service: Cardiovascular;  Laterality: N/A;  . CORONARY STENT PLACEMENT  12/22/2014   LAD   . KNEE SURGERY    . LEFT HEART CATH N/A 01/12/2019   Procedure: Left Heart Cath;  Surgeon: Sherren Mocha, MD;  Location: [place] [facility];  Service: Cardiovascular;  Laterality: N/A;  . LEFT HEART CATH AND CORONARY ANGIOGRAPHY N/A 12/16/2017   Procedure: LEFT HEART CATH AND CORONARY ANGIOGRAPHY;  Surgeon: [place]ique, Peter M, MD;  Location: [place] [facility];  Service: Cardiovascular;  Laterality: N/A;  . LEFT HEART CATHETERIZATION WITH CORONARY ANGIOGRAM N/A 12/22/2014   Procedure: LEFT HEART CATHETERIZATION WITH CORONARY ANGIOGRAM;  Surgeon: Jettie Booze, MD;  Location: [facility];  Service: Cardiovascular;  Laterality: N/A;  . RIGHT/LEFT HEART CATH AND CORONARY ANGIOGRAPHY N/A 01/08/2019   Procedure: RIGHT/LEFT HEART CATH AND CORONARY ANGIOGRAPHY;  Surgeon: Jolaine Artist, MD;  Location: [place] [facility];  Service: Cardiovascular;  Laterality: N/A;  . SHOULDER ARTHROSCOPY WITH ROTATOR CUFF REPAIR AND SUBACROMIAL DECOMPRESSION Right 01/06/2018   Procedure: RIGHT SHOULDER ARTHROSCOPY  DEBRIDEMENT,ACROMIOPLASTY, ROTATOR CUFF REPAIR;  Surgeon: Marchia Bond, MD;  Location: [place];  Service: Orthopedics;  Laterality: Right;     Current Meds  Medication Sig  . albuterol (PROVENTIL HFA;VENTOLIN HFA) 108 (90 Base) MCG/ACT inhaler Inhale 2 puffs into the lungs every 6 (six) hours as needed for wheezing or shortness of breath.  Marland Kitchen amLODipine (NORVASC) 10 MG tablet Take 1 tablet (10 mg total) by mouth daily.  Marland Kitchen aspirin EC 81 MG tablet Take 81 mg by mouth daily.  Marland Kitchen atorvastatin (LIPITOR) 80 MG tablet Take 1 tablet (80 mg total) by mouth daily at 6 PM.  . carvedilol (COREG) 25 MG tablet Take 1 tablet (25 mg total) by mouth 2 (two) times daily with a meal.  . Cholecalciferol 2000 UNITS CAPS Take 2,000 Units by mouth daily.   . cloNIDine (CATAPRES) 0.3 MG tablet Take 1 tablet (0.3 mg total) by mouth 2 (two) times daily.  . clopidogrel (PLAVIX) 75 MG tablet Take 1 tablet (75 mg total) by mouth  daily.  . fluticasone (FLONASE) 50 MCG/ACT nasal spray Place 2 sprays into both nostrils daily as needed for allergies.  . furosemide (LASIX) 40 MG tablet Take 1 tablet (40 mg total) by mouth daily.  Marland Kitchen gabapentin (NEURONTIN) 600 MG tablet Take 600 mg by mouth 3 (three) times daily.  . hydrALAZINE (APRESOLINE) 100 MG tablet Take 1 tablet (100 mg total) by mouth 3 (three) times daily.  . isosorbide dinitrate (ISORDIL) 20 MG tablet Take 1 tablet (20 mg total) by mouth 3 (three) times daily.  . nitroGLYCERIN (NITROSTAT) 0.4 MG SL tablet Place 1 tablet (0.4 mg total) under the tongue every 5 (five) minutes as needed for chest pain.  Marland Kitchen omeprazole (PRILOSEC) 20 MG capsule Take 1 capsule (20 mg total) by mouth daily before breakfast.  . Oxycodone HCl 10 MG TABS Take 10 mg by  mouth 4 (four) times daily.  . potassium chloride (K-DUR) 10 MEQ tablet Take 1 tablet (10 mEq total) by mouth daily.  . sildenafil (REVATIO) 20 MG tablet Take 40-100 mg by mouth as needed (sexual activity).  Marland Kitchen spironolactone (ALDACTONE) 50 MG tablet Take 1 tablet (50 mg total) by mouth daily.  . varenicline (CHANTIX) 0.5 MG tablet Take 2 tablets (1 mg total) by mouth 2 (two) times daily. Take 1 tablet once a day on day 1-3, then 1 tablet twice a day on day 4-7, then 2 tablets twice day.  . [DISCONTINUED] Oxycodone HCl 10 MG TABS Take 1-2 tablets (10-20 mg total) by mouth 4 (four) times daily as needed (for pain). (Patient taking differently: Take 10 mg by mouth 4 (four) times daily. )  . [DISCONTINUED] sildenafil (REVATIO) 20 MG tablet TAKE 2-5 TABLETS AS NEEDED PRIOR TO SEXUAL ACTIVITY (Patient taking differently: Take 40-100 mg by mouth daily as needed (sexual activity). )     Allergies:   Patient has no known allergies.   Social History   Tobacco Use  . Smoking status: Current Every Day Smoker    Packs/day: 1.00    Years: 30.00    Pack years: 30.00    Types: Cigarettes  . Smokeless tobacco: Never Used  . Tobacco comment: "  I have  tried vapor cigarettes"  Substance Use Topics  . Alcohol use: Yes    Alcohol/week: 0.0 standard drinks    Comment: occ beer   . Drug use: Yes    Types: Cocaine    Comment: positive on admission 4/15     Family Hx: The patient's family history includes Cancer in his mother and sister; Diabetes in his father; Heart attack in his father; Hyperlipidemia in his mother; Hypertension in his mother. There is no history of Colon cancer.  ROS:   Please see the history of present illness.     All other systems reviewed and are negative.   Prior CV studies:   The following studies were reviewed today:  RIGHT/LEFT HEART CATH AND CORONARY ANGIOGRAPHY 01/08/19  Conclusion    Non-stenotic Prox LAD-1 lesion was previously treated.  Prox LAD-2 lesion is 50% stenosed.  1st Mrg lesion is 80% stenosed.  Prox RCA to Mid RCA lesion is 99% stenosed.  Mid RCA lesion is 30% stenosed.  Ost 1st Mrg lesion is 90% stenosed.  Ost LAD to Prox LAD lesion is 90% stenosed.  Mid LAD to Dist LAD lesion is 60% stenosed.  Findings: Ao = 170/102 (123) LV = 170/24 RA = 12 RV = 62/19 PA = 61/26 (40) PCW = 32 with v waves to 40 Fick cardiac output/index = 7.3/2.9 PVR = 1.0 WU FA sat = 94% PA sat = 69%, 69%  Assessment: 1. 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 Volume overload with moderate pulmonary venous HTN  Plan/Discussion: Given morbid obesity and diffuse distal LAD disease not candidate for CABG. Films reviewed with Dr. Burt Knack. Plan PCI of RCA and LAD on Tuesday. Will diurese. Watch creatinine closely. Load Plavix.   Glori Bickers, MD 5:30 PM    Echo 01/06/2019  1. The left ventricle has mild-moderately reduced systolic function, with an ejection fraction of 40-45%. The cavity size was normal. There is mildly increased left ventricular wall thickness.  Left ventricular diastolic parameters were normal. Left  ventricular diffuse hypokinesis. 2. The right  ventricle has normal systolic function. The cavity was normal. There is no increase in right ventricular wall thickness. 3. Left atrial size was mildly dilated. 4. The aortic valve is tricuspid. 5. The inferior vena cava was dilated in size with >50% respiratory variability.   CORONARY STENT INTERVENTION 01/12/19  Left Heart Cath   Successful 2 vessel PCI with stenting of the mid-LAD (2.75x16 mm Synergy DES), PTCA of severe ISR of the proximal LAD (3.0 mm  balloon 20 atm), and stenting of the proximal RCA (3.5x20 mm Synergy DES)  Recommend: aggressive risk factor modification, DAPT with ASA and clopidogrel x minimum 12 months. NTG drip initiated in cath lab to help control severe HTN. Anticipate DC home tomorrow if stable     Labs/Other Tests and Data Reviewed:    EKG:  No ECG reviewed.  Recent Labs: 01/06/2019: ALT 37; B Natriuretic Peptide 431.5 01/13/2019: BUN 17; Creatinine, Ser 1.20; Hemoglobin 12.5; Magnesium 2.2; Platelets 254; Potassium 4.1; Sodium 136   Recent Lipid Panel Lab Results  Component Value Date/Time   CHOL 130 01/13/2019 02:22 AM   CHOL 117 12/09/2017 08:12 AM   TRIG 66 01/13/2019 02:22 AM   HDL 43 01/13/2019 02:22 AM   HDL 35 (L) 12/09/2017 08:12 AM   CHOLHDL 3.0 01/13/2019 02:22 AM   LDLCALC 74 01/13/2019 02:22 AM   LDLCALC 67 12/09/2017 08:12 AM    Wt Readings from Last 3 Encounters:  02/17/19 (!) 334 lb (151.5 kg)  01/21/19 (!) 350 lb (158.8 kg)  01/13/19 (!) 350 lb 15.6 oz (159.2 kg)     Objective:    Vital Signs:  BP (!) 152/79   Pulse 69   Ht 5' 7.5" (1.715 m)   Wt (!) 334 lb (151.5 kg)   BMI 51.54 kg/m    VITAL SIGNS:  reviewed GEN:  no acute distress EYES:  sclerae anicteric, EOMI - Extraocular Movements Intact RESPIRATORY:  normal respiratory effort, symmetric expansion NEURO:  alert and oriented x 3, no obvious focal  deficit PSYCH:  normal affect  ASSESSMENT & PLAN:    1. Severe coronary artery disease -Pt found unresponsive by EMS, abnormal EKG, positive for cocaine, negative for COVID.  -Status post cardiac catheterization on 01/08/2019 which showed severe CAD but not amenable to CABG therefore repeat cardiac catheterization was done on 01/12/2019 and subsequent stenting of the RCA and mid LAD. -Now on dual antiplatelet therapy with aspirin and Plavix for minimum of 12 months.Continues on statin and beta-blocker. He verbalizes compliance with meds.  -No current anginal symptoms. -Reviewed lifestyle modification and continued cocaine cessation.  -Heart healthy meals have been delivered. He has been pleased with them except for the low salt. Advised that his taste will adjust over time.   2. Hypertension -Lisinopril on hold due to renal function. On amlodipine, carvedilol, hydralazine, clonidine, isordil. Hydralazine increased at last visit.  BP improving.  -Pt advised on wt loss, limiting salt.  - Will check BMet and resume lisinopril if renal function OK.   3. Tobacco abuse -Trying to quit, down to 1/2 PPD.  -Chantix provided at last visit, but unable to afford.  -We again discussed behavior change and ways to help him cut down.   4. Acute kidney injury -Occurred in hospital related to poor perfusion. Improved by discharge. Lisinopril on hold.  -Will check renal function and resume lisinopril if OK.   5. OSA -Using CPAP consistently  6. Obesity -Body mass index is 51.54 kg/m.  -Wt down from 350 lbs  to 334 lbs.  -Advised on wt loss with diet, portion control, exercise.   7. Hyperlipidemia with LDL goal <70 -LDL was 74 on 01/13/19. Continue high intensity statin with atorvastatin 80 mg daily.  8. Substance abuse -Hx of cocaine use. He denies relapse since hospital but admits that it is hard. Reinforced use of cocaine associate with heart disease and increasing risk of death. He verb  understanding.    COVID-19 Education: The signs and symptoms of COVID-19 were discussed with the patient and how to seek care for testing (follow up with PCP or arrange E-visit).  The importance of social distancing was discussed today.  Time:   Today, I have spent 12 minutes with the patient with telehealth technology discussing the above problems.     Medication Adjustments/Labs and Tests Ordered: Current medicines are reviewed at length with the patient today.  Concerns regarding medicines are outlined above.   Tests Ordered: No orders of the defined types were placed in this encounter.   Medication Changes: No orders of the defined types were placed in this encounter.   Disposition:  Follow up in 4 month(s)  Signed, Daune Perch, NP  02/17/2019 1:14 PM    [facility]

## 2019-02-17 NOTE — Telephone Encounter (Signed)
Please review and advise.

## 2019-02-18 ENCOUNTER — Telehealth: Payer: Self-pay | Admitting: *Deleted

## 2019-02-18 MED ORDER — GABAPENTIN 600 MG PO TABS: 600.0000 mg | ORAL_TABLET | Freq: Three times a day (TID) | ORAL | 0 refills | Status: AC

## 2019-02-18 MED ORDER — ALBUTEROL SULFATE HFA 108 (90 BASE) MCG/ACT IN AERS: 2.0000 | INHALATION_SPRAY | Freq: Four times a day (QID) | RESPIRATORY_TRACT | 0 refills | Status: DC | PRN

## 2019-02-18 NOTE — Telephone Encounter (Signed)
Belongs to Dr.Gottschalk and was dismissed a month ago Another request had come in on Wednesday, passed it to her. Please check and see if she approved or not.

## 2019-02-18 NOTE — Telephone Encounter (Signed)
Fax from [facility] Request Ibuprofen 800 mg 1 Q 8 prn #90 Not on current med list. Last RF 02/27/18 Please advise

## 2019-02-18 NOTE — Telephone Encounter (Signed)
Yes. I have sent these.

## 2019-02-18 NOTE — Telephone Encounter (Signed)
Pt notified of discharge Can we send in 1 month supply of meds?

## 2019-02-18 NOTE — Telephone Encounter (Signed)
Pt notified of refill

## 2019-02-19 ENCOUNTER — Telehealth: Payer: Self-pay | Admitting: Licensed Clinical Social Worker

## 2019-02-19 NOTE — Telephone Encounter (Signed)
CSW contacted patient to follow up on weekly food delivery package. Patient informed of no face to face contact during delivery. CSW discussed transition option for food delivery as the Covid 19 Food relief program will be ending on March 05, 2019. Patient verbalizes understanding and grateful for the assistance.  CSW continues to follow for supportive needs.  Shantee Hayne H. Chad Tiznado, LCSW Clinical Social Worker [facility] Desk#: [phone] Cell#: [phone]   

## 2019-02-25 ENCOUNTER — Telehealth (HOSPITAL_COMMUNITY): Payer: Self-pay | Admitting: Licensed Clinical Social Worker

## 2019-02-25 NOTE — Telephone Encounter (Signed)
CSW contacted patient to follow up on weekly food delivery package. Patient informed of no face to face contact during delivery. CSW discussed transition option for food delivery as the Covid 19 Food relief program will be ending on March 05, 2019. Patient verbalizes understanding and grateful for the assistance.  CSW continues to follow for supportive needs.  Willette Mudry H. Trina Asch, LCSW Clinical Social Worker [facility] Desk#: [phone] Cell#: [phone]   

## 2019-03-01 ENCOUNTER — Other Ambulatory Visit: Payer: Self-pay | Admitting: *Deleted

## 2019-03-01 ENCOUNTER — Ambulatory Visit (HOSPITAL_COMMUNITY)
Admission: RE | Admit: 2019-03-01 | Discharge: 2019-03-01 | Disposition: A | Discharge status: Home / Self Care | Payer: Medicaid Other | Source: Ambulatory Visit | Attending: Internal Medicine | Admitting: Internal Medicine

## 2019-03-01 ENCOUNTER — Other Ambulatory Visit: Payer: Self-pay

## 2019-03-01 NOTE — Progress Notes (Signed)
Called patient to follow up on referral to Phase II Cardiac Rehab s/p stent placement and NSTEMI and to verify his interest in doing the home based program until we reopen. He says he is interested and does not remember getting the application. He said he is currently walking outside on Wednesdays and fridays. He does have a blood pressure monitor but does not have a heart rate monitor. I explained to him there are free applications he can download to his phone to monitor his heart rate. I also informed him I would resend the application for home base today and if he had any difficulty downloading the application to call us back for assistance. I explained to him that once he downloaded the application, we would send him an exercise prescription and communicate with him through the chat feature. He verbalized understanding.   Marland Kitchen       Confirm Consent - "In the setting of the current Covid19 crisis, you are scheduled for a phone visit with your Cardiac or Pulmonary team member on (date) at (time).  Just as we do with many in-gym visits, in order for you to participate in this visit, we must obtain consent.  If you'd like, I can send this to your mychart (if signed up) or email for you to review.  Otherwise, I can obtain your verbal consent now.  By agreeing to a telephone visit, we'd like you to understand that the technology does not allow for your Cardiac or Pulmonary Rehab team member to perform a physical assessment, and thus may limit their ability to fully assess your ability to perform exercise programs. If your provider identifies any concerns that need to be evaluated in person, we will make arrangements to do so).  Finally, though the technology is pretty good, we cannot assure that it will always work on either your or our end and we cannot ensure that we have a secure connection.  Cardiac and Pulmonary Rehab Telehealth visits and "At Home" cardiac and pulmonary rehab are provided at no cost to you. Are  you willing to proceed?" STAFF: Did the patient verbally acknowledge consent to telehealth visit? Document YES/NO here: Yes

## 2019-03-03 ENCOUNTER — Telehealth (HOSPITAL_COMMUNITY): Payer: Self-pay | Admitting: *Deleted

## 2019-03-03 NOTE — Telephone Encounter (Signed)
Called patient today to find out if he needed any help with the downloading the virtual home based app. I resent the invite. She stated that I received it. He said he would work on setting it up. Will check with him in a couple of days to see if he got everything set up.

## 2019-03-17 ENCOUNTER — Other Ambulatory Visit: Payer: Self-pay | Admitting: Family Medicine

## 2019-03-19 ENCOUNTER — Other Ambulatory Visit: Payer: Self-pay | Admitting: Family Medicine

## 2019-03-24 ENCOUNTER — Other Ambulatory Visit: Payer: Self-pay | Admitting: Cardiovascular Disease

## 2019-03-24 ENCOUNTER — Other Ambulatory Visit: Payer: Self-pay | Admitting: Family Medicine

## 2019-03-26 ENCOUNTER — Other Ambulatory Visit: Payer: Self-pay | Admitting: Cardiology

## 2019-03-29 ENCOUNTER — Other Ambulatory Visit: Payer: Self-pay | Admitting: Cardiovascular Disease

## 2019-03-29 MED ORDER — ISOSORBIDE DINITRATE 20 MG PO TABS: 20.0000 mg | ORAL_TABLET | Freq: Three times a day (TID) | ORAL | 0 refills | Status: DC

## 2019-03-29 MED ORDER — CARVEDILOL 25 MG PO TABS: 25.0000 mg | ORAL_TABLET | Freq: Two times a day (BID) | ORAL | 0 refills | Status: DC

## 2019-03-29 NOTE — Telephone Encounter (Signed)
°*  STAT* If patient is at the pharmacy, call can be transferred to refill team.   1. Which medications need to be refilled? (please list name of each medication and dose if known)  carvedilol (COREG) 25 MG tablet isosorbide dinitrate (ISORDIL) 20 MG tablet  2. Which pharmacy/location (including street and city if local pharmacy) is medication to be sent to? [id number] [place] [address]  3. Do they need a 30 day or 90 day supply? 30  Patient is out of medication. He ran out on Friday

## 2019-03-31 ENCOUNTER — Other Ambulatory Visit: Payer: Self-pay | Admitting: Family Medicine

## 2019-05-10 ENCOUNTER — Telehealth: Payer: Self-pay | Admitting: Cardiovascular Disease

## 2019-05-10 DIAGNOSIS — J441 Chronic obstructive pulmonary disease with (acute) exacerbation: Secondary | ICD-10-CM

## 2019-05-10 MED ORDER — ALBUTEROL SULFATE HFA 108 (90 BASE) MCG/ACT IN AERS: 2.0000 | INHALATION_SPRAY | Freq: Four times a day (QID) | RESPIRATORY_TRACT | 2 refills | Status: DC | PRN

## 2019-05-10 MED ORDER — SPIRONOLACTONE 50 MG PO TABS: 50.0000 mg | ORAL_TABLET | Freq: Every day | ORAL | 0 refills | Status: DC

## 2019-05-10 NOTE — Telephone Encounter (Signed)
Spoke with patient who states he is having trouble breathing. He has CPAP which he states he has had the machine for 4-5 years. He does not know who prescribed it to him; I advised him he needs to have regular follow-up for his machine. States he is coughing up clear and milky white phlegm x 2 days Does not know if he has a fever and he does not have a thermometer Reports he ran out of spironolactone 2 days ago so he doubled up on furosemide  Reports good urine output Denies swelling or weight gain It was very difficult to carry on a conversation with the patient. He said he had a bad connection because of where he lives I advised him he needs to see PCP for evaluation and possible Covid screening. I advised that I will refill spironolactone and that he should return to taking furosemide once daily as directed. He also requests refill of his albuterol inhaler. He has an appointment at our office next week with Richardson Dopp, PA; will request that patient get bmet at that appointment.  Patient thanked me for the call.

## 2019-05-10 NOTE — Telephone Encounter (Signed)
Agree.  Probably should have COVID-19 test. Richardson Dopp, PA-C    05/10/2019 4:57 PM

## 2019-05-10 NOTE — Telephone Encounter (Signed)
New Message    Pt c/o Shortness Of Breath: STAT if SOB developed within the last 24 hours or pt is noticeably SOB on the phone  1. Are you currently SOB (can you hear that pt is SOB on the phone)? NO  2. How long have you been experiencing SOB? 2 days ago patient slept with cpap machine and woke up coughing and hard to catch his breath  3. Are you SOB when sitting or when up moving around? Laying down  4. Are you currently experiencing any other symptoms? Coughing and  abdomen soar from coughing

## 2019-05-18 NOTE — Progress Notes (Deleted)
Cardiology Office Note:    Date:  05/18/2019   ID:  Chris Powell, DOB Feb 06, 1966, MRN [id number]  PCP:  Patient, No Pcp Per  Cardiologist:  Mertie Moores, MD *** Electrophysiologist:  None   Referring MD: No ref. provider found   No chief complaint on file. ***  History of Present Illness:    Chris Powell is a 53 y.o. male with ***  Coronary artery disease   S/p NSTEMI 2016 tx with DES to LAD  S/p NSTEMI c/b resp failure/pulmonary edema, cocaine   3 v CAD - not a candidate for CABG >> DES to mid LAD, POBA to oLAD (ISR), DES to RCA  Chronic systolic CHF  Echocardiogram 12/2018: EF 40-45  Morbid obesity  Hypertension  COPD  Diabetes mellitus  Tobacco use  Cocaine abuse  OSA  COPD   Chris Powell was last seen by Daune Perch, NP in 01/2019.  ***  Prior CV studies:   The following studies were reviewed today:  *** Cardiac catheterization 01/12/2019 LM normal LAD ostial 90 ISR, proximal 50, mid 90, distal 60 RI normal OM1 90, 80 RCA proximal 99, mid 30 PCI: 2.75 x 16 mm Synergy DES to the mid LAD PCI: POBA of severe ISR of the proximal LAD PCI: 3.5 x 20 mm Synergy DES to the proximal RCA    Echocardiogram 01/06/2019 EF 40-45, mild LVH, normal diastolic function, diffuse HK, mild LAE   Past Medical History:  Diagnosis Date  . Anemia   . Arthritis   . Asthma   . Bone spur of ankle    right   . CHF (congestive heart failure) ([place])   . COPD (chronic obstructive pulmonary disease) ([place])   . Coronary artery disease   . Depression   . Diabetes ([place])    borderline   . GERD (gastroesophageal reflux disease)   . Hyperlipidemia   . Hypertension   . Myocardial infarction ([place])   . Pneumonia    HX OF PNA  . Right rotator cuff tear 01/06/2018  . Shortness of breath dyspnea   . Sleep apnea    USES CPAP   Surgical Hx: The patient  has a past surgical history that includes Knee surgery; Coronary stent placement (12/22/2014); Cardiac  catheterization (12/22/2014); left heart catheterization with coronary angiogram (N/A, 12/22/2014); LEFT HEART CATH AND CORONARY ANGIOGRAPHY (N/A, 12/16/2017); Coronary angioplasty; Shoulder arthroscopy with rotator cuff repair and subacromial decompression (Right, 01/06/2018); RIGHT/LEFT HEART CATH AND CORONARY ANGIOGRAPHY (N/A, 01/08/2019); CORONARY STENT INTERVENTION (N/A, 01/12/2019); and Left Heart Cath (N/A, 01/12/2019).   Current Medications: No outpatient medications have been marked as taking for the 05/19/19 encounter (Appointment) with Richardson Dopp T, PA-C.     Allergies:   Patient has no known allergies.   Social History   Tobacco Use  . Smoking status: Current Every Day Smoker    Packs/day: 1.00    Years: 30.00    Pack years: 30.00    Types: Cigarettes  . Smokeless tobacco: Never Used  . Tobacco comment: " I have  tried vapor cigarettes"  Substance Use Topics  . Alcohol use: Yes    Alcohol/week: 0.0 standard drinks    Comment: occ beer   . Drug use: Yes    Types: Cocaine    Comment: positive on admission 4/15     Family Hx: The patient's family history includes Cancer in his mother and sister; Diabetes in his father; Heart attack in his father; Hyperlipidemia in his mother; Hypertension in his  mother. There is no history of Colon cancer.  ROS:   Please see the history of present illness.    ROS All other systems reviewed and are negative.   EKGs/Labs/Other Test Reviewed:    EKG:  EKG is *** ordered today.  The ekg ordered today demonstrates ***  Recent Labs: 01/06/2019: ALT 37; B Natriuretic Peptide 431.5 01/13/2019: BUN 17; Creatinine, Ser 1.20; Hemoglobin 12.5; Magnesium 2.2; Platelets 254; Potassium 4.1; Sodium 136   Recent Lipid Panel Lab Results  Component Value Date/Time   CHOL 130 01/13/2019 02:22 AM   CHOL 117 12/09/2017 08:12 AM   TRIG 66 01/13/2019 02:22 AM   HDL 43 01/13/2019 02:22 AM   HDL 35 (L) 12/09/2017 08:12 AM   CHOLHDL 3.0 01/13/2019 02:22 AM    LDLCALC 74 01/13/2019 02:22 AM   LDLCALC 67 12/09/2017 08:12 AM    Physical Exam:    VS:  There were no vitals taken for this visit.    Wt Readings from Last 3 Encounters:  02/17/19 (!) 334 lb (151.5 kg)  01/21/19 (!) 350 lb (158.8 kg)  01/13/19 (!) 350 lb 15.6 oz (159.2 kg)     ***Physical Exam  ASSESSMENT & PLAN:    No diagnosis found.***  Dispo:  No follow-ups on file.   Medication Adjustments/Labs and Tests Ordered: Current medicines are reviewed at length with the patient today.  Concerns regarding medicines are outlined above.  Tests Ordered: No orders of the defined types were placed in this encounter.  Medication Changes: No orders of the defined types were placed in this encounter.   Signed, Tereso NewcomerScott Gale Hulse, PA-C  05/18/2019 6:20 PM    [facility] [address], [place], [place]  [address] Phone: [phone]; Fax: [phone]

## 2019-05-19 ENCOUNTER — Ambulatory Visit: Payer: Medicaid Other | Admitting: Physician Assistant

## 2019-05-27 ENCOUNTER — Ambulatory Visit: Payer: Medicaid Other | Admitting: Cardiovascular Disease

## 2019-05-27 NOTE — Progress Notes (Deleted)
error 

## 2019-06-25 ENCOUNTER — Other Ambulatory Visit: Payer: Self-pay | Admitting: Cardiovascular Disease

## 2019-08-05 ENCOUNTER — Other Ambulatory Visit: Payer: Self-pay | Admitting: Cardiovascular Disease

## 2019-08-26 ENCOUNTER — Other Ambulatory Visit: Payer: Self-pay | Admitting: Cardiovascular Disease

## 2019-09-13 ENCOUNTER — Other Ambulatory Visit: Payer: Self-pay | Admitting: Family Medicine

## 2019-09-13 DIAGNOSIS — J441 Chronic obstructive pulmonary disease with (acute) exacerbation: Secondary | ICD-10-CM

## 2019-11-22 ENCOUNTER — Other Ambulatory Visit: Payer: Self-pay | Admitting: Cardiovascular Disease

## 2019-11-23 ENCOUNTER — Other Ambulatory Visit: Payer: Self-pay | Admitting: *Deleted

## 2019-12-03 IMAGING — DX DG CHEST 2V
2 series · 2 of 2 positions shown · non-contrast
Comparison: 11/20/2017

CLINICAL DATA: Shortness of breath and cough. History of cardiac
stent.

EXAM:
CHEST - 2 VIEW

[chest pa]
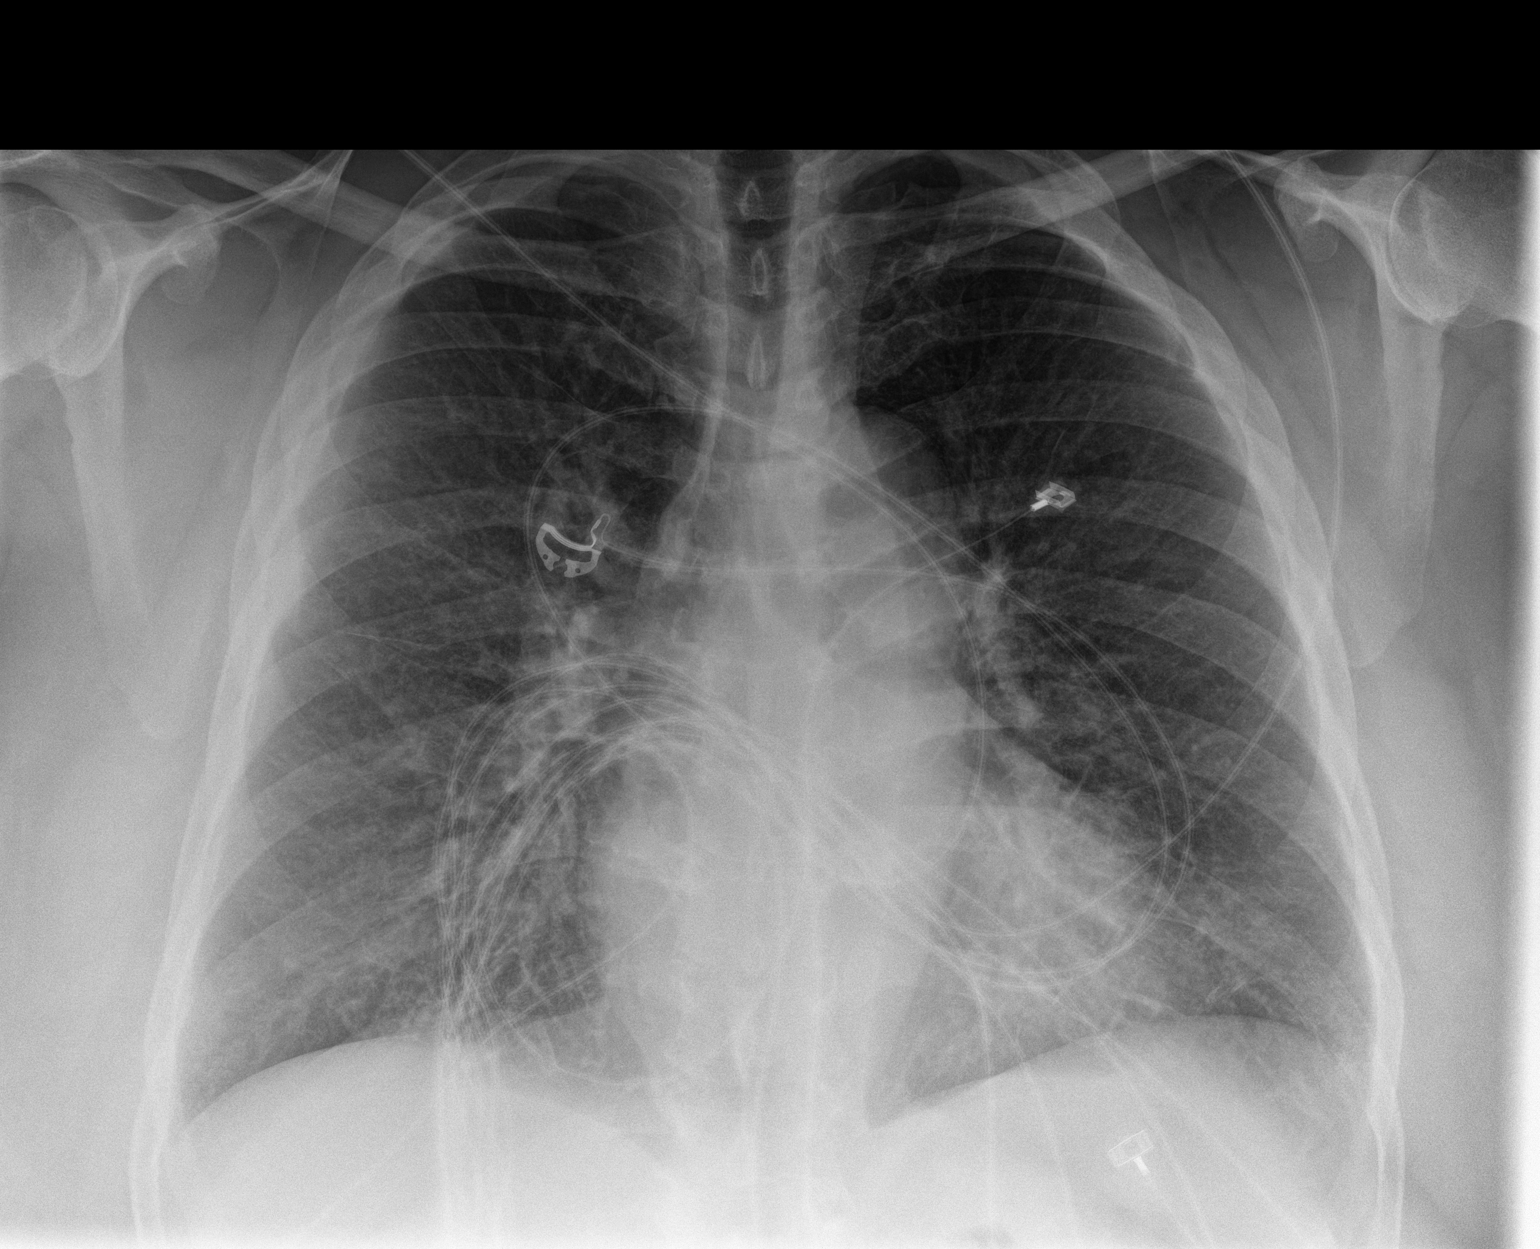

[chest lat]
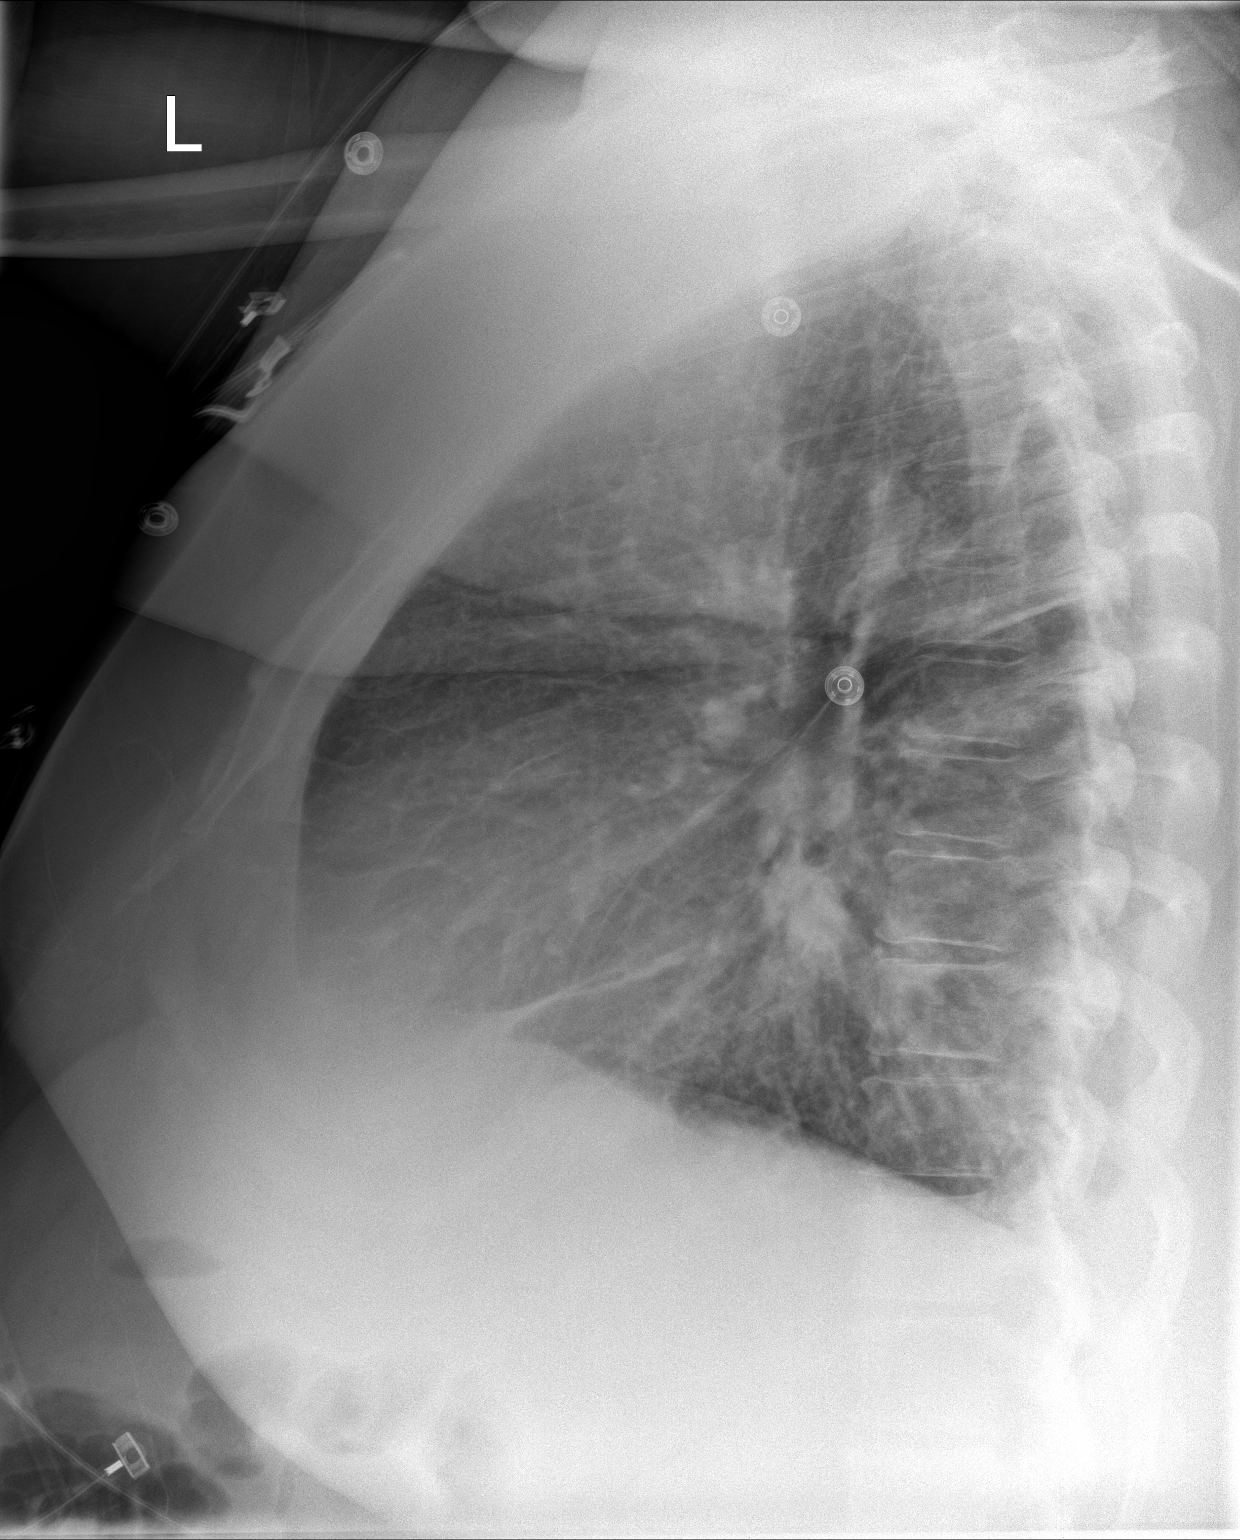

[2 of 2 positions shown; findings below may reference images not displayed]

FINDINGS: Again noted are prominent interstitial lung markings which have
minimally changed. Heart size is within normal limits and stable.
The trachea is midline. No large pleural effusions. Multiple ECG
leads overlying the anterior chest. Bone structures are
unremarkable.
IMPRESSION: Prominent interstitial lung markings have minimally changed.
Difficult to exclude mild interstitial edema. No focal areas of
airspace disease or lung consolidation.

## 2020-03-05 IMAGING — DX PORTABLE CHEST - 1 VIEW
1 series · 1 of 1 positions shown · non-contrast
Comparison: 10/05/2018

CLINICAL DATA: Status post intubation

EXAM:
PORTABLE CHEST 1 VIEW

[chest]
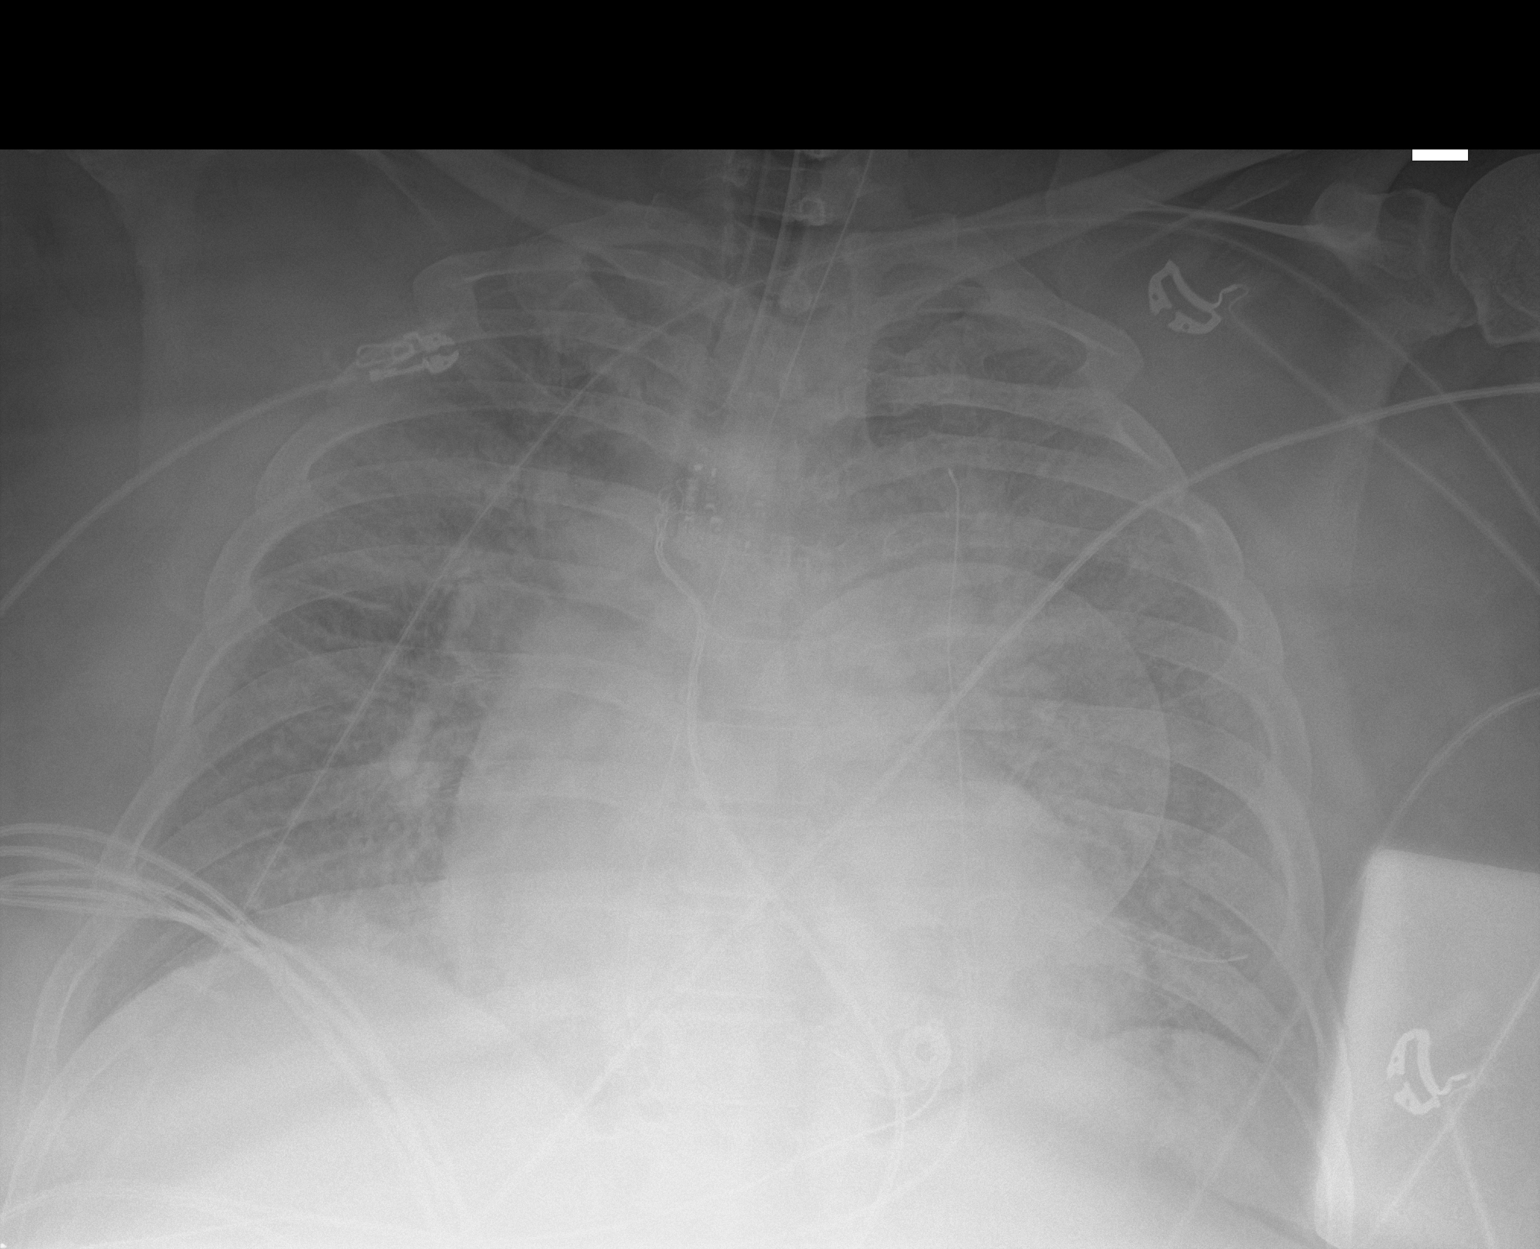

[1 of 1 positions shown; findings below may reference images not displayed]

FINDINGS: Endotracheal tube and gastric catheter are noted in satisfactory
position. Cardiac shadow is mildly enlarged. Increased central
vascular congestion is noted with diffuse pulmonary edema. No
sizable effusion is noted. No bony abnormality is seen.
IMPRESSION: Changes consistent with CHF and pulmonary edema.

## 2020-05-19 ENCOUNTER — Other Ambulatory Visit: Payer: Self-pay | Admitting: Cardiovascular Disease

## 2020-10-23 ENCOUNTER — Other Ambulatory Visit: Payer: Self-pay | Admitting: Cardiovascular Disease

## 2020-11-23 ENCOUNTER — Other Ambulatory Visit: Payer: Self-pay

## 2020-11-23 MED ORDER — HYDRALAZINE HCL 100 MG PO TABS: 100.0000 mg | ORAL_TABLET | Freq: Three times a day (TID) | ORAL | 0 refills | Status: DC

## 2020-11-27 ENCOUNTER — Other Ambulatory Visit: Payer: Self-pay | Admitting: Cardiovascular Disease

## 2020-12-18 ENCOUNTER — Other Ambulatory Visit: Payer: Self-pay | Admitting: Cardiovascular Disease

## 2021-01-03 ENCOUNTER — Other Ambulatory Visit: Payer: Self-pay | Admitting: Cardiovascular Disease

## 2021-01-08 ENCOUNTER — Other Ambulatory Visit: Payer: Self-pay | Admitting: Cardiovascular Disease

## 2021-03-24 ENCOUNTER — Other Ambulatory Visit: Payer: Self-pay | Admitting: Cardiovascular Disease

## 2021-03-28 NOTE — Telephone Encounter (Signed)
Rx(s) sent to pharmacy electronically.  

## 2021-04-11 ENCOUNTER — Other Ambulatory Visit: Payer: Self-pay | Admitting: Cardiovascular Disease

## 2021-05-14 ENCOUNTER — Other Ambulatory Visit: Payer: Self-pay | Admitting: Cardiovascular Disease

## 2021-05-30 ENCOUNTER — Other Ambulatory Visit: Payer: Self-pay | Admitting: Cardiovascular Disease

## 2021-06-23 ENCOUNTER — Encounter (HOSPITAL_COMMUNITY): Payer: Self-pay | Admitting: Emergency Medicine

## 2021-06-23 ENCOUNTER — Emergency Department (HOSPITAL_COMMUNITY): Payer: Medicaid Other

## 2021-06-23 ENCOUNTER — Inpatient Hospital Stay (HOSPITAL_COMMUNITY)
Admission: EM | Admit: 2021-06-23 | Discharge: 2021-06-29 | DRG: 246 | Disposition: A | Discharge status: Home / Self Care | Payer: Medicaid Other | Attending: Cardiology | Admitting: Cardiology

## 2021-06-23 ENCOUNTER — Other Ambulatory Visit: Payer: Self-pay

## 2021-06-23 DIAGNOSIS — Z635 Disruption of family by separation and divorce: Secondary | ICD-10-CM

## 2021-06-23 DIAGNOSIS — I252 Old myocardial infarction: Secondary | ICD-10-CM

## 2021-06-23 DIAGNOSIS — I249 Acute ischemic heart disease, unspecified: Secondary | ICD-10-CM

## 2021-06-23 DIAGNOSIS — F1410 Cocaine abuse, uncomplicated: Secondary | ICD-10-CM | POA: Diagnosis not present

## 2021-06-23 DIAGNOSIS — J449 Chronic obstructive pulmonary disease, unspecified: Secondary | ICD-10-CM | POA: Diagnosis present

## 2021-06-23 DIAGNOSIS — I2510 Atherosclerotic heart disease of native coronary artery without angina pectoris: Secondary | ICD-10-CM

## 2021-06-23 DIAGNOSIS — I5043 Acute on chronic combined systolic (congestive) and diastolic (congestive) heart failure: Secondary | ICD-10-CM | POA: Diagnosis present

## 2021-06-23 DIAGNOSIS — Z7902 Long term (current) use of antithrombotics/antiplatelets: Secondary | ICD-10-CM

## 2021-06-23 DIAGNOSIS — T82855A Stenosis of coronary artery stent, initial encounter: Secondary | ICD-10-CM | POA: Diagnosis present

## 2021-06-23 DIAGNOSIS — I25110 Atherosclerotic heart disease of native coronary artery with unstable angina pectoris: Principal | ICD-10-CM | POA: Diagnosis present

## 2021-06-23 DIAGNOSIS — Z20822 Contact with and (suspected) exposure to covid-19: Secondary | ICD-10-CM | POA: Diagnosis present

## 2021-06-23 DIAGNOSIS — E1142 Type 2 diabetes mellitus with diabetic polyneuropathy: Secondary | ICD-10-CM | POA: Diagnosis not present

## 2021-06-23 DIAGNOSIS — M19012 Primary osteoarthritis, left shoulder: Secondary | ICD-10-CM | POA: Diagnosis present

## 2021-06-23 DIAGNOSIS — I110 Hypertensive heart disease with heart failure: Secondary | ICD-10-CM | POA: Diagnosis not present

## 2021-06-23 DIAGNOSIS — Z9861 Coronary angioplasty status: Secondary | ICD-10-CM

## 2021-06-23 DIAGNOSIS — I200 Unstable angina: Secondary | ICD-10-CM | POA: Diagnosis present

## 2021-06-23 DIAGNOSIS — Z833 Family history of diabetes mellitus: Secondary | ICD-10-CM

## 2021-06-23 DIAGNOSIS — F17210 Nicotine dependence, cigarettes, uncomplicated: Secondary | ICD-10-CM | POA: Diagnosis present

## 2021-06-23 DIAGNOSIS — Z23 Encounter for immunization: Secondary | ICD-10-CM

## 2021-06-23 DIAGNOSIS — R451 Restlessness and agitation: Secondary | ICD-10-CM | POA: Diagnosis not present

## 2021-06-23 DIAGNOSIS — Z634 Disappearance and death of family member: Secondary | ICD-10-CM

## 2021-06-23 DIAGNOSIS — Z9114 Patient's other noncompliance with medication regimen: Secondary | ICD-10-CM

## 2021-06-23 DIAGNOSIS — M25519 Pain in unspecified shoulder: Secondary | ICD-10-CM

## 2021-06-23 DIAGNOSIS — E785 Hyperlipidemia, unspecified: Secondary | ICD-10-CM | POA: Diagnosis present

## 2021-06-23 DIAGNOSIS — Z955 Presence of coronary angioplasty implant and graft: Secondary | ICD-10-CM

## 2021-06-23 DIAGNOSIS — M25551 Pain in right hip: Secondary | ICD-10-CM | POA: Diagnosis present

## 2021-06-23 DIAGNOSIS — F17200 Nicotine dependence, unspecified, uncomplicated: Secondary | ICD-10-CM | POA: Diagnosis present

## 2021-06-23 DIAGNOSIS — Z806 Family history of leukemia: Secondary | ICD-10-CM

## 2021-06-23 DIAGNOSIS — Z6841 Body Mass Index (BMI) 40.0 and over, adult: Secondary | ICD-10-CM

## 2021-06-23 DIAGNOSIS — G4733 Obstructive sleep apnea (adult) (pediatric): Secondary | ICD-10-CM | POA: Diagnosis present

## 2021-06-23 DIAGNOSIS — Z7982 Long term (current) use of aspirin: Secondary | ICD-10-CM

## 2021-06-23 DIAGNOSIS — Y831 Surgical operation with implant of artificial internal device as the cause of abnormal reaction of the patient, or of later complication, without mention of misadventure at the time of the procedure: Secondary | ICD-10-CM | POA: Diagnosis present

## 2021-06-23 DIAGNOSIS — F32A Depression, unspecified: Secondary | ICD-10-CM | POA: Diagnosis not present

## 2021-06-23 DIAGNOSIS — Q245 Malformation of coronary vessels: Secondary | ICD-10-CM | POA: Diagnosis not present

## 2021-06-23 DIAGNOSIS — I10 Essential (primary) hypertension: Secondary | ICD-10-CM | POA: Diagnosis present

## 2021-06-23 DIAGNOSIS — K219 Gastro-esophageal reflux disease without esophagitis: Secondary | ICD-10-CM | POA: Diagnosis present

## 2021-06-23 DIAGNOSIS — Z83438 Family history of other disorder of lipoprotein metabolism and other lipidemia: Secondary | ICD-10-CM

## 2021-06-23 DIAGNOSIS — Z8249 Family history of ischemic heart disease and other diseases of the circulatory system: Secondary | ICD-10-CM

## 2021-06-23 DIAGNOSIS — N179 Acute kidney failure, unspecified: Secondary | ICD-10-CM | POA: Diagnosis present

## 2021-06-23 DIAGNOSIS — Z8701 Personal history of pneumonia (recurrent): Secondary | ICD-10-CM

## 2021-06-23 DIAGNOSIS — M25512 Pain in left shoulder: Secondary | ICD-10-CM | POA: Diagnosis present

## 2021-06-23 DIAGNOSIS — R079 Chest pain, unspecified: Secondary | ICD-10-CM | POA: Diagnosis present

## 2021-06-23 DIAGNOSIS — E782 Mixed hyperlipidemia: Secondary | ICD-10-CM | POA: Diagnosis present

## 2021-06-23 DIAGNOSIS — Z79899 Other long term (current) drug therapy: Secondary | ICD-10-CM

## 2021-06-23 LAB — CBC WITH DIFFERENTIAL/PLATELET
Abs Immature Granulocytes: 0.03 10*3/uL (ref 0.00–0.07)
Basophils Absolute: 0.1 10*3/uL (ref 0.0–0.1)
Basophils Relative: 1 %
Eosinophils Absolute: 0.1 10*3/uL (ref 0.0–0.5)
Eosinophils Relative: 1 %
HCT: 51.3 % (ref 39.0–52.0)
Hemoglobin: 16.6 g/dL (ref 13.0–17.0)
Immature Granulocytes: 0 %
Lymphocytes Relative: 19 %
Lymphs Abs: 1.8 10*3/uL (ref 0.7–4.0)
MCH: 31.3 pg (ref 26.0–34.0)
MCHC: 32.4 g/dL (ref 30.0–36.0)
MCV: 96.8 fL (ref 80.0–100.0)
Monocytes Absolute: 0.7 10*3/uL (ref 0.1–1.0)
Monocytes Relative: 8 %
Neutro Abs: 6.5 10*3/uL (ref 1.7–7.7)
Neutrophils Relative %: 71 %
Platelets: 249 10*3/uL (ref 150–400)
RBC: 5.3 MIL/uL (ref 4.22–5.81)
RDW: 14.7 % (ref 11.5–15.5)
WBC: 9.2 10*3/uL (ref 4.0–10.5)
nRBC: 0 % (ref 0.0–0.2)

## 2021-06-23 LAB — RESP PANEL BY RT-PCR (FLU A&B, COVID) ARPGX2
Influenza A by PCR: NEGATIVE
Influenza B by PCR: NEGATIVE
SARS Coronavirus 2 by RT PCR: NEGATIVE

## 2021-06-23 LAB — COMPREHENSIVE METABOLIC PANEL
ALT: 23 U/L (ref 0–44)
AST: 19 U/L (ref 15–41)
Albumin: 4 g/dL (ref 3.5–5.0)
Alkaline Phosphatase: 67 U/L (ref 38–126)
Anion gap: 10 (ref 5–15)
BUN: 17 mg/dL (ref 6–20)
CO2: 25 mmol/L (ref 22–32)
Calcium: 9.5 mg/dL (ref 8.9–10.3)
Chloride: 104 mmol/L (ref 98–111)
Creatinine, Ser: 1.15 mg/dL (ref 0.61–1.24)
GFR, Estimated: >60 mL/min (ref >60)
Glucose, Bld: 119 mg/dL — ABNORMAL HIGH (ref 70–99)
Potassium: 3.8 mmol/L (ref 3.5–5.1)
Sodium: 139 mmol/L (ref 135–145)
Total Bilirubin: 0.4 mg/dL (ref 0.3–1.2)
Total Protein: 8.2 g/dL — ABNORMAL HIGH (ref 6.5–8.1)

## 2021-06-23 LAB — BRAIN NATRIURETIC PEPTIDE: B Natriuretic Peptide: 185 pg/mL — ABNORMAL HIGH (ref 0.0–100.0)

## 2021-06-23 LAB — TROPONIN I (HIGH SENSITIVITY)
Troponin I (High Sensitivity): 37 ng/L — ABNORMAL HIGH (ref <18)
Troponin I (High Sensitivity): 36 ng/L — ABNORMAL HIGH (ref <18)

## 2021-06-23 MED ORDER — HEPARIN BOLUS VIA INFUSION
4000.0000 [IU] | Freq: Once | INTRAVENOUS | Status: AC
  Administered 2021-06-23: 4000 [IU] via INTRAVENOUS

## 2021-06-23 MED ORDER — ACETAMINOPHEN 325 MG PO TABS
650.0000 mg | ORAL_TABLET | ORAL | Status: DC | PRN
  Administered 2021-06-26: 650 mg via ORAL
  Filled 2021-06-23: qty 2

## 2021-06-23 MED ORDER — ALBUTEROL SULFATE HFA 108 (90 BASE) MCG/ACT IN AERS
2.0000 | INHALATION_SPRAY | RESPIRATORY_TRACT | Status: DC | PRN
  Filled 2021-06-23: qty 6.7

## 2021-06-23 MED ORDER — ONDANSETRON HCL 4 MG/2ML IJ SOLN: 4.0000 mg | Freq: Four times a day (QID) | INTRAMUSCULAR | Status: DC | PRN

## 2021-06-23 MED ORDER — ALBUTEROL SULFATE (2.5 MG/3ML) 0.083% IN NEBU
2.5000 mg | INHALATION_SOLUTION | RESPIRATORY_TRACT | Status: DC | PRN
Start: 2021-06-23 — End: 2021-06-29

## 2021-06-23 MED ORDER — NITROGLYCERIN 0.4 MG SL SUBL
0.4000 mg | SUBLINGUAL_TABLET | SUBLINGUAL | Status: DC | PRN
  Administered 2021-06-25 – 2021-06-26 (×3): 0.4 mg via SUBLINGUAL
  Filled 2021-06-23 (×2): qty 1

## 2021-06-23 MED ORDER — FUROSEMIDE 10 MG/ML IJ SOLN
40.0000 mg | INTRAMUSCULAR | Status: AC
  Administered 2021-06-23: 40 mg via INTRAVENOUS
  Filled 2021-06-23: qty 4

## 2021-06-23 MED ORDER — ASPIRIN 81 MG PO CHEW
324.0000 mg | CHEWABLE_TABLET | Freq: Once | ORAL | Status: AC
  Administered 2021-06-23: 324 mg via ORAL
  Filled 2021-06-23: qty 4

## 2021-06-23 MED ORDER — NITROGLYCERIN 2 % TD OINT
1.0000 [in_us] | TOPICAL_OINTMENT | Freq: Once | TRANSDERMAL | Status: AC
  Administered 2021-06-23: 1 [in_us] via TOPICAL
  Filled 2021-06-23: qty 1

## 2021-06-23 MED ORDER — HEPARIN (PORCINE) 25000 UT/250ML-% IV SOLN
2000.0000 [IU]/h | INTRAVENOUS | Status: DC
  Administered 2021-06-23: 1500 [IU]/h via INTRAVENOUS
  Administered 2021-06-24: 1900 [IU]/h via INTRAVENOUS
  Administered 2021-06-24 – 2021-06-25 (×2): 2000 [IU]/h via INTRAVENOUS
  Filled 2021-06-23 (×4): qty 250

## 2021-06-23 MED ORDER — ASPIRIN EC 81 MG PO TBEC
81.0000 mg | DELAYED_RELEASE_TABLET | Freq: Every day | ORAL | Status: DC
  Administered 2021-06-24 – 2021-06-29 (×6): 81 mg via ORAL
  Filled 2021-06-23 (×6): qty 1

## 2021-06-23 NOTE — ED Triage Notes (Signed)
Patient brought in via EMS from home. Alert and oriented. Airway patent. Patient c/o shortness of breath that started this morning. Per patient productive cough with thick yellow sputum. Patient unsure of any fever but reports chills. Patient has hx of COPD in which he uses inhaler. Patient used inhaler today with no relief. Patient reports mid-sternal, non-radiating, intermittent chest pain x1 month. Per patient hx of x3 cardiac stents placed.

## 2021-06-23 NOTE — ED Triage Notes (Signed)
Patient also c/o pain to left shoulder due a fall x2 weeks ago.

## 2021-06-23 NOTE — Progress Notes (Signed)
ANTICOAGULATION CONSULT NOTE - Initial Consult  Pharmacy Consult for Heparin Indication: chest pain/ACS  No Known Allergies  Patient Measurements: Height: 5' 7.5" (171.5 cm) Weight: (!) 154.2 kg (340 lb) IBW/kg (Calculated) : 67.25 Heparin Dosing Weight: 105 kg  Vital Signs: Temp: 98.3 F (36.8 C) (10/01 1812) Temp Source: Oral (10/01 1645) BP: 175/96 (10/01 2000) Pulse Rate: 91 (10/01 2000)  Labs: Recent Labs    06/23/21 1900  HGB 16.6  HCT 51.3  PLT 249  CREATININE 1.15  TROPONINIHS 37*    Estimated Creatinine Clearance: 104.8 mL/min (by C-G formula based on SCr of 1.15 mg/dL).   Medical History: Past Medical History:  Diagnosis Date   Anemia    Arthritis    Asthma    Bone spur of ankle    right    CHF (congestive heart failure) (HCC)    COPD (chronic obstructive pulmonary disease) (HCC)    Coronary artery disease    Depression    Diabetes (HCC)    borderline    GERD (gastroesophageal reflux disease)    Hyperlipidemia    Hypertension    Myocardial infarction (HCC)    Pneumonia    HX OF PNA   Right rotator cuff tear 01/06/2018   Shortness of breath dyspnea    Sleep apnea    USES CPAP    Medications:  See electronic med rec  Assessment: 55 y.o. M presents with SOB. To begin heparin for r/o ACS. No AC PTA. CBC ok on admission.   Goal of Therapy:  Heparin level 0.3-0.7 units/ml Monitor platelets by anticoagulation protocol: Yes   Plan:  Heparin IV bolus 4000 units Heparin gtt at 1500 units/hr Will f/u heparin level in 6 hours Daily heparin level and CBC  Christoper Fabian, PharmD, BCPS Please see amion for complete clinical pharmacist phone list 06/23/2021,9:33 PM

## 2021-06-23 NOTE — ED Provider Notes (Signed)
[facility] EMERGENCY DEPARTMENT Provider Note   CSN: [id number] Arrival date & time: 06/23/21  1612     History Chief Complaint  Patient presents with   Shortness of Breath    Chris Powell is a 55 y.o. male.   Shortness of Breath  This patient is a 55 year old male with a known history of coronary disease and congestive heart failure, he is a diabetic.  He has had several cardiac stents placed in the past.  Over the last month he has had some progressive and intermittent chest discomfort which seems to be associated with shortness of breath the last couple of days it is lasting longer and is becoming more uncomfortable.  At this time he is pain-free but is worried about the escalating symptoms especially with exertion.  The patient does report that he is seen by Dr. Elease Hashimoto with the cardiology service.  Based on my review of the medical record he is followed by cardiology and has been to cardiac rehab in the past.  Currently the patient takes medications including carvedilol hydralazine isosorbide, spironolactone, clopidogrel, atorvastatin, clonidine, furosemide and amlodipine but states he is out of many of these medications and voices his frustration of not being able to get a hold of doctors to have them refilled.  He has not been to their office recently.  Incidentally the worsening of the patient's symptoms coincides with the loss of his roommate and longtime friend who died unexpectedly about 45 days ago.  The patient notes that he has had significant amounts of stress but is having exertional symptoms as well  Past Medical History:  Diagnosis Date   Anemia    Arthritis    Asthma    Bone spur of ankle    right    CHF (congestive heart failure) (HCC)    COPD (chronic obstructive pulmonary disease) (HCC)    Coronary artery disease    Depression    Diabetes (HCC)    borderline    GERD (gastroesophageal reflux disease)    Hyperlipidemia    Hypertension    Myocardial  infarction (HCC)    Pneumonia    HX OF PNA   Right rotator cuff tear 01/06/2018   Shortness of breath dyspnea    Sleep apnea    USES CPAP    Patient Active Problem List   Diagnosis Date Noted   Unstable angina (HCC) 06/23/2021   OSA on CPAP 02/17/2019   CAD (coronary artery disease) 01/21/2019   Non-ST elevation (NSTEMI) myocardial infarction (HCC)    Acute hypercapnic respiratory failure (HCC) 01/06/2019   Acute pulmonary edema (HCC) 01/06/2019   AKI (acute kidney injury) (HCC) 01/06/2019   Elevated troponin 01/06/2019   Shortness of breath 10/05/2018   Right rotator cuff tear 01/06/2018   Angina pectoris (HCC) 12/16/2017   Type 2 diabetes mellitus with renal complication (HCC) 11/27/2017   Erectile dysfunction 09/05/2016   Chronic pain syndrome 01/11/2016   AC (acromioclavicular) arthritis 09/06/2015   Sprain of right shoulder 09/06/2015   Metabolic syndrome 08/29/2015   Abdominal wall pain 08/15/2015   Disorder of ligament of right wrist 08/09/2015   Ganglion of right wrist 08/09/2015   Primary osteoarthritis of right hand 08/09/2015   Right wrist pain 07/13/2015   GERD (gastroesophageal reflux disease) 02/28/2015   Vitamin D deficiency 02/28/2015   Hyperlipidemia LDL goal <70 02/28/2015   Chronic knee pain 02/28/2015   Cocaine abuse (HCC) 01/25/2015   Hx of non-ST elevation myocardial infarction (NSTEMI) 01/24/2015  CAD - S/P LAD DES 3/11/22/14 12/23/2014   Morbid obesity-BMI 51 12/23/2014   COPD with exacerbation (HCC) 12/23/2014   HTN (hypertension) 12/23/2014   Smoking 12/23/2014   Non-sustained ventricular tachycardia 12/23/2014   Hypokalemia 12/23/2014   Acute coronary syndrome (HCC) 12/22/2014    Past Surgical History:  Procedure Laterality Date   CARDIAC CATHETERIZATION  12/22/2014   CORONARY ANGIOPLASTY     CORONARY STENT INTERVENTION N/A 01/12/2019   Procedure: CORONARY STENT INTERVENTION;  Surgeon: Tonny Bollman, MD;  Location[facility] INVASIVE CV LAB;   Service: Cardiovascular;  Laterality: N/A;   CORONARY STENT PLACEMENT  12/22/2014   LAD   KNEE SURGERY     LEFT HEART CATH N/A 01/12/2019   Procedure: Left Heart Cath;  Surgeon: Tonny Bollman, MD;  Location: [facility] INVASIVE CV LAB;  Service: Cardiovascular;  Laterality: N/A;   LEFT HEART CATH AND CORONARY ANGIOGRAPHY N/A 12/16/2017   Procedure: LEFT HEART CATH AND CORONARY ANGIOGRAPHY;  Surgeon: [place], Peter M, MD;  Location: [facility] INVASIVE CV LAB;  Service: Cardiovascular;  Laterality: N/A;   LEFT HEART CATHETERIZATION WITH CORONARY ANGIOGRAM N/A 12/22/2014   Procedure: LEFT HEART CATHETERIZATION WITH CORONARY ANGIOGRAM;  Surgeon: Corky Crafts, MD;  Location: [facility] CATH LAB;  Service: Cardiovascular;  Laterality: N/A;   RIGHT/LEFT HEART CATH AND CORONARY ANGIOGRAPHY N/A 01/08/2019   Procedure: RIGHT/LEFT HEART CATH AND CORONARY ANGIOGRAPHY;  Surgeon: Dolores Patty, MD;  Location: [facility] INVASIVE CV LAB;  Service: Cardiovascular;  Laterality: N/A;   SHOULDER ARTHROSCOPY WITH ROTATOR CUFF REPAIR AND SUBACROMIAL DECOMPRESSION Right 01/06/2018   Procedure: RIGHT SHOULDER ARTHROSCOPY  DEBRIDEMENT,ACROMIOPLASTY, ROTATOR CUFF REPAIR;  Surgeon: Teryl Lucy, MD;  Location: [facility] OR;  Service: Orthopedics;  Laterality: Right;       Family History  Problem Relation Age of Onset   Heart attack Father    Diabetes Father    Cancer Mother        leukemia   Hypertension Mother    Hyperlipidemia Mother    Cancer Sister    Colon cancer Neg Hx     Social History   Tobacco Use   Smoking status: Every Day    Packs/day: 1.00    Years: 30.00    Pack years: 30.00    Types: Cigarettes   Smokeless tobacco: Never   Tobacco comments:    " I have  tried vapor cigarettes"  Vaping Use   Vaping Use: Former  Substance Use Topics   Alcohol use: Yes    Alcohol/week: 0.0 standard drinks    Comment: occ beer    Drug use: Yes    Types: Cocaine    Home Medications Prior to Admission medications    Medication Sig Start Date End Date Taking? Authorizing Provider  albuterol (VENTOLIN HFA) 108 (90 Base) MCG/ACT inhaler Inhale 2 puffs into the lungs every 6 (six) hours as needed for wheezing or shortness of breath. 05/10/19   Nahser, Deloris Ping, MD  amLODipine (NORVASC) 10 MG tablet Take 1 tablet (10 mg total) by mouth daily. 10/06/18   Leatha Gilding, MD  aspirin EC 81 MG tablet Take 81 mg by mouth daily.    [provider]  atorvastatin (LIPITOR) 80 MG tablet Take 1 tablet (80 mg total) by mouth daily at 6 PM. 10/06/18   Gherghe, Daylene Katayama, MD  carvedilol (COREG) 25 MG tablet Take 1 tablet (25 mg total) by mouth 2 (two) times daily with a meal for 15 days. FINAL ATTEMPT PLEASE CONTACT OFFICE TO MAKE  AN APPOINTMENT FOR ADDITIONAL REFILLS 03/28/21 04/12/21  Nahser, Deloris Ping, MD  Cholecalciferol 2000 UNITS CAPS Take 2,000 Units by mouth daily.     [provider]  cloNIDine (CATAPRES) 0.3 MG tablet Take 1 tablet (0.3 mg total) by mouth 2 (two) times daily. 10/06/18   Leatha Gilding, MD  clopidogrel (PLAVIX) 75 MG tablet Take 1 tablet (75 mg total) by mouth daily. 02/17/19   Berton Bon, NP  fluticasone [facility]) 50 MCG/ACT nasal spray Place 2 sprays into both nostrils daily as needed for allergies. 10/06/18   Leatha Gilding, MD  furosemide (LASIX) 40 MG tablet Take 1 tablet (40 mg total) by mouth daily. 10/06/18   Leatha Gilding, MD  gabapentin (NEURONTIN) 600 MG tablet Take 1 tablet (600 mg total) by mouth 3 (three) times daily. 02/18/19   Raliegh Ip, DO  hydrALAZINE (APRESOLINE) 100 MG tablet Take 1 tablet (100 mg total) by mouth 3 (three) times daily for 15 days. FINAL ATTEMPT PLEASE CONTACT OFFICE TO MAKE AN APPOINTMENT FOR ADDITIONAL REFILLS 03/28/21 04/12/21  Nahser, Deloris Ping, MD  isosorbide dinitrate (ISORDIL) 20 MG tablet Take 1 tablet (20 mg total) by mouth 3 (three) times daily for 15 days. FINAL ATTEMPT PLEASE CONTACT OFFICE TO MAKE AN APPOINTMENT FOR ADDITIONAL  REFILLS 03/28/21 04/12/21  Nahser, Deloris Ping, MD  nitroGLYCERIN (NITROSTAT) 0.4 MG SL tablet Place 1 tablet (0.4 mg total) under the tongue every 5 (five) minutes as needed for chest pain. 10/06/18   Leatha Gilding, MD  omeprazole (PRILOSEC) 20 MG capsule Take 1 capsule (20 mg total) by mouth daily before breakfast. 10/06/18   Leatha Gilding, MD  Oxycodone HCl 10 MG TABS Take 10 mg by mouth 4 (four) times daily.    [provider]  potassium chloride (K-DUR) 10 MEQ tablet Take 1 tablet (10 mEq total) by mouth daily. 10/06/18   Leatha Gilding, MD  sildenafil (REVATIO) 20 MG tablet Take 40-100 mg by mouth as needed (sexual activity).    [provider]  spironolactone (ALDACTONE) 50 MG tablet Take 1 tablet (50 mg total) by mouth daily. Please schedule appt for future refills. 1st attempt. 05/19/20   Nahser, Deloris Ping, MD  varenicline (CHANTIX) 0.5 MG tablet Take 2 tablets (1 mg total) by mouth 2 (two) times daily. Take 1 tablet once a day on day 1-3, then 1 tablet twice a day on day 4-7, then 2 tablets twice day. 01/21/19   Berton Bon, NP    Allergies    Patient has no known allergies.  Review of Systems   Review of Systems  Respiratory:  Positive for shortness of breath.   All other systems reviewed and are negative.  Physical Exam Updated Vital Signs BP (!) 175/96   Pulse 91   Temp 98.3 F (36.8 C)   Resp (!) 24   Ht 1.715 m (5' 7.5")   Wt (!) 154.2 kg   SpO2 100%   BMI 52.47 kg/m   Physical Exam Vitals and nursing note reviewed.  Constitutional:      General: He is not in acute distress.    Appearance: He is well-developed.  HENT:     Head: Normocephalic and atraumatic.     Mouth/Throat:     Pharynx: No oropharyngeal exudate.  Eyes:     General: No scleral icterus.       Right eye: No discharge.        Left eye: No discharge.  Conjunctiva/sclera: Conjunctivae normal.     Pupils: Pupils are equal, round, and reactive to light.  Neck:      Thyroid: No thyromegaly.     Vascular: No JVD.  Cardiovascular:     Rate and Rhythm: Normal rate and regular rhythm.     Heart sounds: Normal heart sounds. No murmur heard.   No friction rub. No gallop.  Pulmonary:     Effort: Pulmonary effort is normal. No respiratory distress.     Breath sounds: Normal breath sounds. No wheezing or rales.  Abdominal:     General: Bowel sounds are normal. There is no distension.     Palpations: Abdomen is soft. There is no mass.     Tenderness: There is no abdominal tenderness.  Musculoskeletal:        General: No tenderness. Normal range of motion.     Cervical back: Normal range of motion and neck supple.  Lymphadenopathy:     Cervical: No cervical adenopathy.  Skin:    General: Skin is warm and dry.     Findings: No erythema or rash.  Neurological:     Mental Status: He is alert.     Coordination: Coordination normal.  Psychiatric:        Behavior: Behavior normal.    ED Results / Procedures / Treatments   Labs (all labs ordered are listed, but only abnormal results are displayed) Labs Reviewed  COMPREHENSIVE METABOLIC PANEL - Abnormal; Notable for the following components:      Result Value   Glucose, Bld 119 (*)    Total Protein 8.2 (*)    All other components within normal limits  TROPONIN I (HIGH SENSITIVITY) - Abnormal; Notable for the following components:   Troponin I (High Sensitivity) 37 (*)    All other components within normal limits  CBC WITH DIFFERENTIAL/PLATELET  BRAIN NATRIURETIC PEPTIDE  TROPONIN I (HIGH SENSITIVITY)    EKG EKG Interpretation  Date/Time:  Saturday June 23 2021 16:43:09 EDT Ventricular Rate:  88 PR Interval:  120 QRS Duration: 82 QT Interval:  346 QTC Calculation: 418 R Axis:   38 Text Interpretation: Normal sinus rhythm Possible Left atrial enlargement ST & T wave abnormality, consider lateral ischemia Abnormal ECG similar to prior EKG's from 2020 Confirmed by Eber Hong ([id number]) on  06/23/2021 7:15:05 PM  Radiology DG Chest 2 View  Result Date: 06/23/2021 CLINICAL DATA:  Shortness of breath. EXAM: CHEST - 2 VIEW COMPARISON:  Chest x-ray 01/06/2019. FINDINGS: The heart is mildly enlarged. There central pulmonary vascular congestion. There are minimal interstitial opacities in the lung bases. There is no focal lung infiltrate. Costophrenic angles are clear. There is no pneumothorax or acute fracture. IMPRESSION: 1. Cardiomegaly with mild interstitial edema. Electronically Signed   By: Darliss Cheney M.D.   On: 06/23/2021 18:37    Procedures .Critical Care Performed by: Eber Hong, MD Authorized by: Eber Hong, MD   Critical care provider statement:    Critical care time (minutes):  35   Critical care time was exclusive of:  Separately billable procedures and treating other patients and teaching time   Critical care was necessary to treat or prevent imminent or life-threatening deterioration of the following conditions:  Cardiac failure   Critical care was time spent personally by me on the following activities:  Blood draw for specimens, development of treatment plan with patient or surrogate, discussions with consultants, evaluation of patient's response to treatment, examination of patient, obtaining history from patient or surrogate, ordering  and performing treatments and interventions, ordering and review of laboratory studies, ordering and review of radiographic studies, pulse oximetry, re-evaluation of patient's condition and review of old charts Comments:         Medications Ordered in ED Medications  albuterol (VENTOLIN HFA) 108 (90 Base) MCG/ACT inhaler 2 puff (has no administration in time range)  nitroGLYCERIN (NITROGLYN) 2 % ointment 1 inch (1 inch Topical Given 06/23/21 1959)  aspirin chewable tablet 324 mg (324 mg Oral Given 06/23/21 1955)  furosemide (LASIX) injection 40 mg (40 mg Intravenous Given 06/23/21 1957)    ED Course  I have reviewed the  triage vital signs and the nursing notes.  Pertinent labs & imaging results that were available during my care of the patient were reviewed by me and considered in my medical decision making (see chart for details).    MDM Rules/Calculators/A&P                           This patient is in no distress at this time but is significantly hypertensive at 183/100.  His EKG is abnormal but not significantly changed from prior EKGs.  He has some nonspecific T wave changes and T wave inversions which have been seen on prior.  At this time troponin is pending, chest x-ray shows some cardiomegaly and interstitial edema.  We will need to consult with cardiology, give him some Lasix, will give aspirin, if pain recurs will need nitroglycerin but I anticipate the patient will need to be admitted due to what appears to be unstable angina with his progressive symptoms over the month.  The patient's labs are abnormal with an elevated troponin at 37, given the abnormal EKG the hypertension and the elevated troponin I decided discussed the case with the on-call cardiologist who agrees that this patient likely needs to come down to [facility] and accepts the patient's to the cardiology service under the care of Dr. Rennis Golden.  Temporary admission orders have been written.  The patient will be placed on a heparin drip, cardiology is in agreement with this plan.  This patient is critically ill with unstable angina, possibly early NSTEMI given elevated troponin  Final Clinical Impression(s) / ED Diagnoses Final diagnoses:  Unstable angina (HCC)     Eber Hong, MD 06/23/21 2120

## 2021-06-23 NOTE — ED Notes (Signed)
Pt given diet ginger ale and graham crackers 

## 2021-06-23 NOTE — ED Notes (Signed)
Pt refused second blood draw per lab tech.

## 2021-06-24 ENCOUNTER — Observation Stay (HOSPITAL_BASED_OUTPATIENT_CLINIC_OR_DEPARTMENT_OTHER): Payer: Medicaid Other

## 2021-06-24 DIAGNOSIS — R079 Chest pain, unspecified: Secondary | ICD-10-CM

## 2021-06-24 DIAGNOSIS — I10 Essential (primary) hypertension: Secondary | ICD-10-CM

## 2021-06-24 DIAGNOSIS — I5020 Unspecified systolic (congestive) heart failure: Secondary | ICD-10-CM

## 2021-06-24 LAB — HEPARIN LEVEL (UNFRACTIONATED)
Heparin Unfractionated: 0.11 IU/mL — ABNORMAL LOW (ref 0.30–0.70)
Heparin Unfractionated: 0.36 IU/mL (ref 0.30–0.70)
Heparin Unfractionated: 0.27 IU/mL — ABNORMAL LOW (ref 0.30–0.70)

## 2021-06-24 LAB — ECHOCARDIOGRAM COMPLETE
AR max vel: 2.45 cm2
AV Area VTI: 2.47 cm2
AV Area mean vel: 2.24 cm2
AV Mean grad: 4 mmHg
AV Peak grad: 8.1 mmHg
Ao pk vel: 1.42 m/s
Area-P 1/2: 3.16 cm2
Height: 67.5 in
S' Lateral: 3.4 cm
Weight: 5440 oz

## 2021-06-24 LAB — LIPID PANEL
Cholesterol: 188 mg/dL (ref 0–200)
HDL: 35 mg/dL — ABNORMAL LOW (ref >40)
LDL Cholesterol: 128 mg/dL — ABNORMAL HIGH (ref 0–99)
Total CHOL/HDL Ratio: 5.4 RATIO
Triglycerides: 125 mg/dL (ref <150)
VLDL: 25 mg/dL (ref 0–40)

## 2021-06-24 LAB — CBC
HCT: 44.8 % (ref 39.0–52.0)
Hemoglobin: 14.9 g/dL (ref 13.0–17.0)
MCH: 30.8 pg (ref 26.0–34.0)
MCHC: 33.3 g/dL (ref 30.0–36.0)
MCV: 92.8 fL (ref 80.0–100.0)
Platelets: 258 10*3/uL (ref 150–400)
RBC: 4.83 MIL/uL (ref 4.22–5.81)
RDW: 14.6 % (ref 11.5–15.5)
WBC: 8.9 10*3/uL (ref 4.0–10.5)
nRBC: 0 % (ref 0.0–0.2)

## 2021-06-24 LAB — HEMOGLOBIN A1C
Hgb A1c MFr Bld: 6.6 % — ABNORMAL HIGH (ref 4.8–5.6)
Mean Plasma Glucose: 142.72 mg/dL

## 2021-06-24 LAB — MRSA NEXT GEN BY PCR, NASAL: MRSA by PCR Next Gen: DETECTED — AB

## 2021-06-24 LAB — BRAIN NATRIURETIC PEPTIDE: B Natriuretic Peptide: 35.6 pg/mL (ref 0.0–100.0)

## 2021-06-24 LAB — HIV ANTIBODY (ROUTINE TESTING W REFLEX): HIV Screen 4th Generation wRfx: NONREACTIVE

## 2021-06-24 LAB — BASIC METABOLIC PANEL
Anion gap: 9 (ref 5–15)
BUN: 15 mg/dL (ref 6–20)
CO2: 25 mmol/L (ref 22–32)
Calcium: 9.2 mg/dL (ref 8.9–10.3)
Chloride: 103 mmol/L (ref 98–111)
Creatinine, Ser: 1.14 mg/dL (ref 0.61–1.24)
GFR, Estimated: >60 mL/min (ref >60)
Glucose, Bld: 171 mg/dL — ABNORMAL HIGH (ref 70–99)
Potassium: 3.5 mmol/L (ref 3.5–5.1)
Sodium: 137 mmol/L (ref 135–145)

## 2021-06-24 LAB — PROTIME-INR
INR: 1 (ref 0.8–1.2)
Prothrombin Time: 12.8 seconds (ref 11.4–15.2)

## 2021-06-24 LAB — GLUCOSE, CAPILLARY: Glucose-Capillary: 180 mg/dL — ABNORMAL HIGH (ref 70–99)

## 2021-06-24 MED ORDER — AMLODIPINE BESYLATE 10 MG PO TABS
10.0000 mg | ORAL_TABLET | Freq: Every day | ORAL | Status: DC
  Administered 2021-06-24 – 2021-06-29 (×6): 10 mg via ORAL
  Filled 2021-06-24 (×6): qty 1

## 2021-06-24 MED ORDER — HEPARIN BOLUS VIA INFUSION
3000.0000 [IU] | Freq: Once | INTRAVENOUS | Status: AC
  Administered 2021-06-24: 3000 [IU] via INTRAVENOUS
  Filled 2021-06-24: qty 3000

## 2021-06-24 MED ORDER — FUROSEMIDE 40 MG PO TABS
40.0000 mg | ORAL_TABLET | Freq: Every day | ORAL | Status: DC
  Administered 2021-06-24 – 2021-06-27 (×4): 40 mg via ORAL
  Filled 2021-06-24 (×4): qty 1

## 2021-06-24 MED ORDER — INSULIN ASPART 100 UNIT/ML IJ SOLN
0.0000 [IU] | Freq: Three times a day (TID) | INTRAMUSCULAR | Status: DC
  Administered 2021-06-25 (×2): 2 [IU] via SUBCUTANEOUS
  Administered 2021-06-25: 3 [IU] via SUBCUTANEOUS
  Administered 2021-06-26 – 2021-06-27 (×3): 2 [IU] via SUBCUTANEOUS
  Administered 2021-06-27: 3 [IU] via SUBCUTANEOUS
  Administered 2021-06-28: 1 [IU] via SUBCUTANEOUS
  Administered 2021-06-28: 5 [IU] via SUBCUTANEOUS
  Administered 2021-06-28: 1 [IU] via SUBCUTANEOUS
  Administered 2021-06-29 (×2): 3 [IU] via SUBCUTANEOUS

## 2021-06-24 MED ORDER — INFLUENZA VAC SPLIT QUAD 0.5 ML IM SUSY
0.5000 mL | PREFILLED_SYRINGE | INTRAMUSCULAR | Status: AC
  Administered 2021-06-25: 0.5 mL via INTRAMUSCULAR
  Filled 2021-06-24: qty 0.5

## 2021-06-24 MED ORDER — CLONIDINE HCL 0.2 MG PO TABS
0.3000 mg | ORAL_TABLET | Freq: Two times a day (BID) | ORAL | Status: DC
  Administered 2021-06-24 – 2021-06-29 (×12): 0.3 mg via ORAL
  Filled 2021-06-24 (×12): qty 1

## 2021-06-24 MED ORDER — ATORVASTATIN CALCIUM 80 MG PO TABS
80.0000 mg | ORAL_TABLET | Freq: Every day | ORAL | Status: DC
  Administered 2021-06-24 – 2021-06-28 (×4): 80 mg via ORAL
  Filled 2021-06-24 (×4): qty 1

## 2021-06-24 MED ORDER — GABAPENTIN 600 MG PO TABS
600.0000 mg | ORAL_TABLET | Freq: Three times a day (TID) | ORAL | Status: DC
  Administered 2021-06-24 – 2021-06-29 (×15): 600 mg via ORAL
  Filled 2021-06-24 (×15): qty 1

## 2021-06-24 MED ORDER — ASPIRIN EC 81 MG PO TBEC: 81.0000 mg | DELAYED_RELEASE_TABLET | Freq: Every day | ORAL | Status: DC

## 2021-06-24 MED ORDER — PERFLUTREN LIPID MICROSPHERE
1.0000 mL | INTRAVENOUS | Status: AC | PRN
  Administered 2021-06-24: 2 mL via INTRAVENOUS
  Filled 2021-06-24: qty 10

## 2021-06-24 MED ORDER — PANTOPRAZOLE SODIUM 40 MG PO TBEC
40.0000 mg | DELAYED_RELEASE_TABLET | Freq: Every day | ORAL | Status: DC
  Administered 2021-06-24 – 2021-06-29 (×6): 40 mg via ORAL
  Filled 2021-06-24 (×6): qty 1

## 2021-06-24 MED ORDER — HYDRALAZINE HCL 50 MG PO TABS
50.0000 mg | ORAL_TABLET | Freq: Three times a day (TID) | ORAL | Status: DC
  Administered 2021-06-24 – 2021-06-27 (×10): 50 mg via ORAL
  Filled 2021-06-24 (×10): qty 1

## 2021-06-24 MED ORDER — SPIRONOLACTONE 25 MG PO TABS
50.0000 mg | ORAL_TABLET | Freq: Every day | ORAL | Status: DC
  Administered 2021-06-24 – 2021-06-28 (×5): 50 mg via ORAL
  Filled 2021-06-24 (×5): qty 2

## 2021-06-24 MED ORDER — CARVEDILOL 25 MG PO TABS
25.0000 mg | ORAL_TABLET | Freq: Two times a day (BID) | ORAL | Status: DC
  Administered 2021-06-24 – 2021-06-29 (×11): 25 mg via ORAL
  Filled 2021-06-24 (×11): qty 1

## 2021-06-24 NOTE — H&P (Signed)
Cardiology Admission History and Physical:   Patient ID: Chris Powell MRN: [id number]; DOB: [birth year]   Admission date: 06/23/2021  PCP:  Patient, No Pcp Per (Inactive)   [facility] Providers Cardiologist:  Mertie Moores, MD        Chief Complaint:  chest pain and shortness of breath  Patient Profile:   Chris Powell is a 55 y.o. male with CAD, T2DM, HTN, HLD, CHF, GERD, OSA who is being seen 06/24/2021 for the evaluation of chest pain and shortness of breath.  History of Present Illness:   Chris Powell is a 55 year old male with coronary artery disease, type 2 diabetes, hypertension, hyperlipidemia, heart failure, GERD, OSA who initially presented with progressive shortness of breath and chest discomfort.  Notes he has been having progressive and intermittent chest discomfort over the last month.  Notes that over the last few days his symptoms have been escalating.  Unfortunately, notes that he has run out of his medications recently and has been off of many of his antihypertensives, diuretic, antiplatelet, and lipid therapy over the last 7 days. He feels his symptoms started prior to this week, but are worse now. He also has been dealing with the stress of the death of his brother that happened a little over a month ago.  Denies any other symptoms.   Past Medical History:  Diagnosis Date   Anemia    Arthritis    Asthma    Bone spur of ankle    right    CHF (congestive heart failure) (HCC)    COPD (chronic obstructive pulmonary disease) (HCC)    Coronary artery disease    Depression    Diabetes (HCC)    borderline    GERD (gastroesophageal reflux disease)    Hyperlipidemia    Hypertension    Myocardial infarction ([place])    Pneumonia    HX OF PNA   Right rotator cuff tear 01/06/2018   Shortness of breath dyspnea    Sleep apnea    USES CPAP    Past Surgical History:  Procedure Laterality Date   CARDIAC CATHETERIZATION  12/22/2014   CORONARY ANGIOPLASTY      CORONARY STENT INTERVENTION N/A 01/12/2019   Procedure: CORONARY STENT INTERVENTION;  Surgeon: Sherren Mocha, MD;  Location: [place] [facility];  Service: Cardiovascular;  Laterality: N/A;   CORONARY STENT PLACEMENT  12/22/2014   LAD   KNEE SURGERY     LEFT HEART CATH N/A 01/12/2019   Procedure: Left Heart Cath;  Surgeon: Sherren Mocha, MD;  Location: [place] [facility];  Service: Cardiovascular;  Laterality: N/A;   LEFT HEART CATH AND CORONARY ANGIOGRAPHY N/A 12/16/2017   Procedure: LEFT HEART CATH AND CORONARY ANGIOGRAPHY;  Surgeon: Martinique, Peter M, MD;  Location: [place] [facility];  Service: Cardiovascular;  Laterality: N/A;   LEFT HEART CATHETERIZATION WITH CORONARY ANGIOGRAM N/A 12/22/2014   Procedure: LEFT HEART CATHETERIZATION WITH CORONARY ANGIOGRAM;  Surgeon: Jettie Booze, MD;  Location: [facility];  Service: Cardiovascular;  Laterality: N/A;   RIGHT/LEFT HEART CATH AND CORONARY ANGIOGRAPHY N/A 01/08/2019   Procedure: RIGHT/LEFT HEART CATH AND CORONARY ANGIOGRAPHY;  Surgeon: Jolaine Artist, MD;  Location: [place] [facility];  Service: Cardiovascular;  Laterality: N/A;   SHOULDER ARTHROSCOPY WITH ROTATOR CUFF REPAIR AND SUBACROMIAL DECOMPRESSION Right 01/06/2018   Procedure: RIGHT SHOULDER ARTHROSCOPY  DEBRIDEMENT,ACROMIOPLASTY, ROTATOR CUFF REPAIR;  Surgeon: Marchia Bond, MD;  Location: [place];  Service: Orthopedics;  Laterality: Right;     Medications  Prior to Admission: Prior to Admission medications   Medication Sig Start Date End Date Taking? Authorizing Provider  albuterol (VENTOLIN HFA) 108 (90 Base) MCG/ACT inhaler Inhale 2 puffs into the lungs every 6 (six) hours as needed for wheezing or shortness of breath. 05/10/19   Nahser, Wonda Cheng, MD  amLODipine (NORVASC) 10 MG tablet Take 1 tablet (10 mg total) by mouth daily. 10/06/18   Caren Griffins, MD  aspirin EC 81 MG tablet Take 81 mg by mouth daily.    [provider]  atorvastatin (LIPITOR) 80 MG tablet  Take 1 tablet (80 mg total) by mouth daily at 6 PM. 10/06/18   Gherghe, Vella Redhead, MD  carvedilol (COREG) 25 MG tablet Take 1 tablet (25 mg total) by mouth 2 (two) times daily with a meal for 15 days. FINAL ATTEMPT PLEASE CONTACT OFFICE TO MAKE AN APPOINTMENT FOR ADDITIONAL REFILLS 03/28/21 04/12/21  Nahser, Wonda Cheng, MD  Cholecalciferol 2000 UNITS CAPS Take 2,000 Units by mouth daily.     [provider]  cloNIDine (CATAPRES) 0.3 MG tablet Take 1 tablet (0.3 mg total) by mouth 2 (two) times daily. 10/06/18   Caren Griffins, MD  clopidogrel (PLAVIX) 75 MG tablet Take 1 tablet (75 mg total) by mouth daily. 02/17/19   Daune Perch, NP  fluticasone [facility]) 50 MCG/ACT nasal spray Place 2 sprays into both nostrils daily as needed for allergies. 10/06/18   Caren Griffins, MD  furosemide (LASIX) 40 MG tablet Take 1 tablet (40 mg total) by mouth daily. 10/06/18   Caren Griffins, MD  gabapentin (NEURONTIN) 600 MG tablet Take 1 tablet (600 mg total) by mouth 3 (three) times daily. 02/18/19   Janora Norlander, DO  hydrALAZINE (APRESOLINE) 100 MG tablet Take 1 tablet (100 mg total) by mouth 3 (three) times daily for 15 days. FINAL ATTEMPT PLEASE CONTACT OFFICE TO MAKE AN APPOINTMENT FOR ADDITIONAL REFILLS 03/28/21 04/12/21  Nahser, Wonda Cheng, MD  isosorbide dinitrate (ISORDIL) 20 MG tablet Take 1 tablet (20 mg total) by mouth 3 (three) times daily for 15 days. FINAL ATTEMPT PLEASE CONTACT OFFICE TO MAKE AN APPOINTMENT FOR ADDITIONAL REFILLS 03/28/21 04/12/21  Nahser, Wonda Cheng, MD  nitroGLYCERIN (NITROSTAT) 0.4 MG SL tablet Place 1 tablet (0.4 mg total) under the tongue every 5 (five) minutes as needed for chest pain. 10/06/18   Caren Griffins, MD  omeprazole (PRILOSEC) 20 MG capsule Take 1 capsule (20 mg total) by mouth daily before breakfast. 10/06/18   Caren Griffins, MD  Oxycodone HCl 10 MG TABS Take 10 mg by mouth 4 (four) times daily.    [provider]  potassium chloride (K-DUR) 10 MEQ  tablet Take 1 tablet (10 mEq total) by mouth daily. 10/06/18   Caren Griffins, MD  sildenafil (REVATIO) 20 MG tablet Take 40-100 mg by mouth as needed (sexual activity).    [provider]  spironolactone (ALDACTONE) 50 MG tablet Take 1 tablet (50 mg total) by mouth daily. Please schedule appt for future refills. 1st attempt. 05/19/20   Nahser, Wonda Cheng, MD  varenicline (CHANTIX) 0.5 MG tablet Take 2 tablets (1 mg total) by mouth 2 (two) times daily. Take 1 tablet once a day on day 1-3, then 1 tablet twice a day on day 4-7, then 2 tablets twice day. 01/21/19   Daune Perch, NP     Allergies:   No Known Allergies  Social History:   Social History   Socioeconomic History  Marital status: Legally Separated    Spouse name: Not on file   Number of children: Not on file   Years of education: Not on file   Highest education level: Not on file  Occupational History   Not on file  Tobacco Use   Smoking status: Every Day    Packs/day: 1.00    Years: 30.00    Pack years: 30.00    Types: Cigarettes   Smokeless tobacco: Never   Tobacco comments:    " I have  tried vapor cigarettes"  Vaping Use   Vaping Use: Former  Substance and Sexual Activity   Alcohol use: Yes    Alcohol/week: 0.0 standard drinks    Comment: occ beer    Drug use: Yes    Types: Cocaine   Sexual activity: Not on file  Other Topics Concern   Not on file  Social History Narrative   Not on file   Social Determinants of Health   Financial Resource Strain: Not on file  Food Insecurity: Not on file  Transportation Needs: Not on file  Physical Activity: Not on file  Stress: Not on file  Social Connections: Not on file  Intimate Partner Violence: Not on file    Family History:  noncontributory The patient's family history includes Cancer in his mother and sister; Diabetes in his father; Heart attack in his father; Hyperlipidemia in his mother; Hypertension in his mother. There is no history of Colon  cancer.    ROS:  Please see the history of present illness.  All other ROS reviewed and negative.     Physical Exam/Data:   Vitals:   06/23/21 2237 06/23/21 2354 06/24/21 0000 06/24/21 0018  BP:  (!) 204/128 (!) 178/123 (!) 184/104  Pulse:  87 94   Resp:  20 20   Temp: 98.2 F (36.8 C) 98.2 F (36.8 C) 98.2 F (36.8 C)   TempSrc: Oral     SpO2:  95% 99%   Weight:      Height:        Intake/Output Summary (Last 24 hours) at 06/24/2021 0146 Last data filed at 06/24/2021 0100 Gross per 24 hour  Intake 549.97 ml  Output 1000 ml  Net -450.03 ml   Last 3 Weights 06/23/2021 02/17/2019 01/21/2019  Weight (lbs) 340 lb 334 lb 350 lb  Weight (kg) 154.223 kg 151.501 kg 158.759 kg     Body mass index is 52.47 kg/m.  General:  NAD, lying in bed comfortable HEENT: normal Neck: JVD difficult to appreciate due to habitus Vascular: No carotid bruits; Distal pulses 2+ bilaterally   Cardiac:  normal S1, S2; RRR; no murmur  Lungs:  diminished breathsounds bilaterally Abd: soft, nontender, no hepatomegaly  Ext: 1+ edema bilaterally Musculoskeletal:  No deformities, BUE and BLE strength normal and equal Skin: warm and dry  Neuro:  CNs 2-12 intact, no focal abnormalities noted Psych:  Normal affect    EKG:  The ECG that was done  was personally reviewed and demonstrates normal sinus rhythm with nonspecific t wave changes   Relevant CV Studies: Cardiac cath 12/2018: Successful 2 vessel PCI with stenting of the mid-LAD (2.75x16 mm Synergy DES), PTCA of severe ISR of the proximal LAD (3.0 mm Keo balloon 20 atm), and stenting of the proximal RCA (3.5x20 mm Synergy DES)   Recommend: aggressive risk factor modification, DAPT with ASA and clopidogrel x minimum 12 months. NTG drip initiated in cath lab to help control severe HTN. Anticipate DC home  tomorrow if stable.  Laboratory Data:  High Sensitivity Troponin:   Recent Labs  Lab 06/23/21 1900 06/23/21 2133  TROPONINIHS 37* 36*       Chemistry Recent Labs  Lab 06/23/21 1900  NA 139  K 3.8  CL 104  CO2 25  GLUCOSE 119*  BUN 17  CREATININE 1.15  CALCIUM 9.5  GFRNONAA >60  ANIONGAP 10    Recent Labs  Lab 06/23/21 1900  PROT 8.2*  ALBUMIN 4.0  AST 19  ALT 23  ALKPHOS 67  BILITOT 0.4   Lipids No results for input(s): CHOL, TRIG, HDL, LABVLDL, LDLCALC, CHOLHDL in the last 168 hours. Hematology Recent Labs  Lab 06/23/21 1900  WBC 9.2  RBC 5.30  HGB 16.6  HCT 51.3  MCV 96.8  MCH 31.3  MCHC 32.4  RDW 14.7  PLT 249   Thyroid No results for input(s): TSH, FREET4 in the last 168 hours. BNP Recent Labs  Lab 06/23/21 1900  BNP 185.0*    DDimer No results for input(s): DDIMER in the last 168 hours.   Radiology/Studies:  DG Chest 2 View  Result Date: 06/23/2021 CLINICAL DATA:  Shortness of breath. EXAM: CHEST - 2 VIEW COMPARISON:  Chest x-ray 01/06/2019. FINDINGS: The heart is mildly enlarged. There central pulmonary vascular congestion. There are minimal interstitial opacities in the lung bases. There is no focal lung infiltrate. Costophrenic angles are clear. There is no pneumothorax or acute fracture. IMPRESSION: 1. Cardiomegaly with mild interstitial edema. Electronically Signed   By: Ronney Asters M.D.   On: 06/23/2021 18:37     Assessment and Plan:   Chest pain.  Difficult on initial assessment to tell whether this is progressive stable angina versus symptomatic hypertensive emergency/urgency.  Fortunately his troponins were flat on arrival.  His blood pressure was markedly elevated and he has not been on his home antihypertensive regimen (which includes 5 medications) for over a week.  Difficult to assess his compliance prior to that, albeit he states that he took his medications every day prior to running out this last week.  With some blood pressure modification, his symptoms have improved some.  I would err on the side of treating him medically and seeing his symptom burden.  Can consider  stress testing later if he still has chest pain when his blood pressure is well controlled. Plan:  - continue heparin for now given low bleeding risk; repeat echo ordered. Hemoglobin a1c and lipid panel ordered - HTN: Restart home antihypertensive regimen: Amlodipine 10 mg, carvedilol 25 mg twice a day, clonidine 0.3 mg twice a day, hydralazine 50 mg 3 times a day, sprionolactone 50 mg .  Add nitrates if still hypertensive tomorrow. - CAD/HLD: Continue aspirin 81 mg (loaded with 324 mg in ED) and atorva 80 - HFrEF. Last EF 40-45% in 2020 with global HK.  repeat echo as above. Continue home PO lasix 40 mg for now (may need more) -GERD: protonix 40 -Neuropathy: gabapentin 600 mg tid    Risk Assessment/Risk Scores:    HEAR Score (for undifferentiated chest pain):  HEAR Score: 4       Severity of Illness: The appropriate patient status for this patient is OBSERVATION. Observation status is judged to be reasonable and necessary in order to provide the required intensity of service to ensure the patient's safety. The patient's presenting symptoms, physical exam findings, and initial radiographic and laboratory data in the context of their medical condition is felt to place them at decreased risk  for further clinical deterioration. Furthermore, it is anticipated that the patient will be medically stable for discharge from the hospital within 2 midnights of admission. The following factors support the patient status of observation.   " The patient's presenting symptoms include chest pain, shortness of breath. " The physical exam findings include elevated Bp. " The initial radiographic and laboratory data are unremarkable.   For questions or updates, please contact [place] Please consult [email or web address] for contact info under     Signed, Doyne Keel, MD  06/24/2021 1:46 AM

## 2021-06-24 NOTE — Plan of Care (Signed)
  Problem: Health Behavior/Discharge Planning: Goal: Ability to manage health-related needs will improve Outcome: Not Progressing   Problem: Clinical Measurements: Goal: Ability to maintain clinical measurements within normal limits will improve Outcome: Not Progressing Goal: Diagnostic test results will improve Outcome: Not Progressing Goal: Cardiovascular complication will be avoided Outcome: Not Progressing   Problem: Coping: Goal: Level of anxiety will decrease Outcome: Not Progressing

## 2021-06-24 NOTE — Progress Notes (Signed)
  Echocardiogram 2D Echocardiogram with contrast has been performed.  Chris Powell F 06/24/2021, 10:00 AM

## 2021-06-24 NOTE — Progress Notes (Signed)
ANTICOAGULATION CONSULT NOTE  Pharmacy Consult for Heparin Indication: chest pain/ACS  No Known Allergies  Patient Measurements: Height: 5' 7.5" (171.5 cm) Weight: (!) 154.2 kg (340 lb) IBW/kg (Calculated) : 67.25 Heparin Dosing Weight: 105 kg  Vital Signs: Temp: 98.9 F (37.2 C) (10/02 1605) Temp Source: Oral (10/02 1605) BP: 118/79 (10/02 1605) Pulse Rate: 71 (10/02 1134)  Labs: Recent Labs    06/23/21 1900 06/23/21 2133 06/24/21 0422 06/24/21 1148 06/24/21 1806  HGB 16.6  --  14.9  --   --   HCT 51.3  --  44.8  --   --   PLT 249  --  258  --   --   LABPROT  --   --  12.8  --   --   INR  --   --  1.0  --   --   HEPARINUNFRC  --   --  0.11* 0.36 0.27*  CREATININE 1.15  --  1.14  --   --   TROPONINIHS 37* 36*  --   --   --      Estimated Creatinine Clearance: 105.7 mL/min (by C-G formula based on SCr of 1.14 mg/dL).   Medical History: Past Medical History:  Diagnosis Date   Anemia    Arthritis    Asthma    Bone spur of ankle    right    CHF (congestive heart failure) (HCC)    COPD (chronic obstructive pulmonary disease) (HCC)    Coronary artery disease    Depression    Diabetes (HCC)    borderline    GERD (gastroesophageal reflux disease)    Hyperlipidemia    Hypertension    Myocardial infarction (HCC)    Pneumonia    HX OF PNA   Right rotator cuff tear 01/06/2018   Shortness of breath dyspnea    Sleep apnea    USES CPAP     Assessment: 55 y.o. M presents with SOB. To begin heparin for r/o ACS. No AC PTA. Plans are to pursue medical management and observing symptom burden. May consider stress testing if symptoms persist after achieving BP control. Pharmacy to dose heparin.   Heparin level 0.27 and subtherapeutic after last therapeutic level on 1900 units/hr.   Goal of Therapy:  Heparin level 0.3-0.7 units/ml Monitor platelets by anticoagulation protocol: Yes   Plan:  Increase IV heparin gtt to 2000 units/hr Daily heparin level,  CBC Monitor for s/sx of bleeding  Thank you for involving pharmacy in this patient's care.  Alphia Moh, PharmD, BCPS, BCCP Clinical Pharmacist  Please check AMION for all [facility] Pharmacy phone numbers After 10:00 PM, call Main Pharmacy [phone]

## 2021-06-24 NOTE — Progress Notes (Signed)
ANTICOAGULATION CONSULT NOTE  Pharmacy Consult for Heparin Indication: chest pain/ACS  No Known Allergies  Patient Measurements: Height: 5' 7.5" (171.5 cm) Weight: (!) 154.2 kg (340 lb) IBW/kg (Calculated) : 67.25 Heparin Dosing Weight: 105 kg  Vital Signs: Temp: 98 F (36.7 C) (10/02 1134) Temp Source: Oral (10/02 1134) BP: 137/73 (10/02 1134) Pulse Rate: 71 (10/02 1134)  Labs: Recent Labs    06/23/21 1900 06/23/21 2133 06/24/21 0422 06/24/21 1148  HGB 16.6  --  14.9  --   HCT 51.3  --  44.8  --   PLT 249  --  258  --   LABPROT  --   --  12.8  --   INR  --   --  1.0  --   HEPARINUNFRC  --   --  0.11* 0.36  CREATININE 1.15  --  1.14  --   TROPONINIHS 37* 36*  --   --      Estimated Creatinine Clearance: 105.7 mL/min (by C-G formula based on SCr of 1.14 mg/dL).   Medical History: Past Medical History:  Diagnosis Date   Anemia    Arthritis    Asthma    Bone spur of ankle    right    CHF (congestive heart failure) (HCC)    COPD (chronic obstructive pulmonary disease) (HCC)    Coronary artery disease    Depression    Diabetes (HCC)    borderline    GERD (gastroesophageal reflux disease)    Hyperlipidemia    Hypertension    Myocardial infarction (HCC)    Pneumonia    HX OF PNA   Right rotator cuff tear 01/06/2018   Shortness of breath dyspnea    Sleep apnea    USES CPAP    Medications:  See electronic med rec  Assessment: 55 y.o. M presents with SOB. To begin heparin for r/o ACS. No AC PTA. Plans are to pursue medical management and observing symptom burden. May consider stress testing if symptoms persist after achieving BP control. Pharmacy to dose heparin.   Heparin level 0.36 and therapeutic after rate increase to 1900 units/h. Hgb and PLT are stable and WNL. No signs of bleeding or IV site issues per RN (notes that heparin infusion stopped for 5 minutes to administer contrast given only 1 IV site access).  Goal of Therapy:  Heparin level  0.3-0.7 units/ml Monitor platelets by anticoagulation protocol: Yes   Plan:  Continue IV heparin gtt 1900 units/hr Confirmatory heparin level in 6 h Daily heparin level, CBC Monitor for s/sx of bleeding  Thank you for involving pharmacy in this patient's care.  Arnette Felts, PharmD PGY1 Ambulatory Care Pharmacy Resident 06/24/2021 1:42 PM  **Pharmacist phone directory can be found on [email or web address] listed under [facility]**

## 2021-06-24 NOTE — Progress Notes (Signed)
ANTICOAGULATION CONSULT NOTE  Pharmacy Consult for Heparin Indication: chest pain/ACS  No Known Allergies  Patient Measurements: Height: 5' 7.5" (171.5 cm) Weight: (!) 154.2 kg (340 lb) IBW/kg (Calculated) : 67.25 Heparin Dosing Weight: 105 kg  Vital Signs: Temp: 99.3 F (37.4 C) (10/02 0500) Temp Source: Oral (10/02 0500) BP: 133/58 (10/02 0500) Pulse Rate: 73 (10/02 0500)  Labs: Recent Labs    06/23/21 1900 06/23/21 2133 06/24/21 0422  HGB 16.6  --  14.9  HCT 51.3  --  44.8  PLT 249  --  258  LABPROT  --   --  12.8  INR  --   --  1.0  HEPARINUNFRC  --   --  0.11*  CREATININE 1.15  --  1.14  TROPONINIHS 37* 36*  --      Estimated Creatinine Clearance: 105.7 mL/min (by C-G formula based on SCr of 1.14 mg/dL).   Medical History: Past Medical History:  Diagnosis Date   Anemia    Arthritis    Asthma    Bone spur of ankle    right    CHF (congestive heart failure) (HCC)    COPD (chronic obstructive pulmonary disease) (HCC)    Coronary artery disease    Depression    Diabetes (HCC)    borderline    GERD (gastroesophageal reflux disease)    Hyperlipidemia    Hypertension    Myocardial infarction (HCC)    Pneumonia    HX OF PNA   Right rotator cuff tear 01/06/2018   Shortness of breath dyspnea    Sleep apnea    USES CPAP    Medications:  See electronic med rec  Assessment: 55 y.o. M presents with SOB. To begin heparin for r/o ACS. No AC PTA. CBC ok on admission.   Heparin level subtherapeutic (0.11) on gtt at 1500 units/hr. No issues with line or bleeding reported per RN.  Goal of Therapy:  Heparin level 0.3-0.7 units/ml Monitor platelets by anticoagulation protocol: Yes   Plan:  Rebolus heparin 3000 units Increase heparin gtt to 1900 units/hr Will f/u heparin level in 6 hours  Christoper Fabian, PharmD, BCPS Please see amion for complete clinical pharmacist phone list 06/24/2021,5:17 AM

## 2021-06-25 DIAGNOSIS — Z23 Encounter for immunization: Secondary | ICD-10-CM | POA: Diagnosis not present

## 2021-06-25 DIAGNOSIS — N179 Acute kidney failure, unspecified: Secondary | ICD-10-CM | POA: Diagnosis present

## 2021-06-25 DIAGNOSIS — M19012 Primary osteoarthritis, left shoulder: Secondary | ICD-10-CM | POA: Diagnosis present

## 2021-06-25 DIAGNOSIS — R079 Chest pain, unspecified: Secondary | ICD-10-CM | POA: Diagnosis not present

## 2021-06-25 DIAGNOSIS — E1142 Type 2 diabetes mellitus with diabetic polyneuropathy: Secondary | ICD-10-CM | POA: Diagnosis present

## 2021-06-25 DIAGNOSIS — I5042 Chronic combined systolic (congestive) and diastolic (congestive) heart failure: Secondary | ICD-10-CM | POA: Diagnosis not present

## 2021-06-25 DIAGNOSIS — F17210 Nicotine dependence, cigarettes, uncomplicated: Secondary | ICD-10-CM | POA: Diagnosis present

## 2021-06-25 DIAGNOSIS — F1410 Cocaine abuse, uncomplicated: Secondary | ICD-10-CM | POA: Diagnosis present

## 2021-06-25 DIAGNOSIS — I200 Unstable angina: Secondary | ICD-10-CM | POA: Diagnosis present

## 2021-06-25 DIAGNOSIS — F1490 Cocaine use, unspecified, uncomplicated: Secondary | ICD-10-CM

## 2021-06-25 DIAGNOSIS — Z20822 Contact with and (suspected) exposure to covid-19: Secondary | ICD-10-CM | POA: Diagnosis present

## 2021-06-25 DIAGNOSIS — I110 Hypertensive heart disease with heart failure: Secondary | ICD-10-CM | POA: Diagnosis present

## 2021-06-25 DIAGNOSIS — Q245 Malformation of coronary vessels: Secondary | ICD-10-CM | POA: Diagnosis not present

## 2021-06-25 DIAGNOSIS — I25110 Atherosclerotic heart disease of native coronary artery with unstable angina pectoris: Secondary | ICD-10-CM | POA: Diagnosis present

## 2021-06-25 DIAGNOSIS — I5043 Acute on chronic combined systolic (congestive) and diastolic (congestive) heart failure: Secondary | ICD-10-CM | POA: Diagnosis present

## 2021-06-25 DIAGNOSIS — K219 Gastro-esophageal reflux disease without esophagitis: Secondary | ICD-10-CM | POA: Diagnosis present

## 2021-06-25 DIAGNOSIS — Z79899 Other long term (current) drug therapy: Secondary | ICD-10-CM | POA: Diagnosis not present

## 2021-06-25 DIAGNOSIS — F32A Depression, unspecified: Secondary | ICD-10-CM | POA: Diagnosis present

## 2021-06-25 DIAGNOSIS — T82855A Stenosis of coronary artery stent, initial encounter: Secondary | ICD-10-CM | POA: Diagnosis present

## 2021-06-25 DIAGNOSIS — M25512 Pain in left shoulder: Secondary | ICD-10-CM | POA: Diagnosis not present

## 2021-06-25 DIAGNOSIS — Z6841 Body Mass Index (BMI) 40.0 and over, adult: Secondary | ICD-10-CM | POA: Diagnosis not present

## 2021-06-25 DIAGNOSIS — E785 Hyperlipidemia, unspecified: Secondary | ICD-10-CM | POA: Diagnosis present

## 2021-06-25 DIAGNOSIS — J449 Chronic obstructive pulmonary disease, unspecified: Secondary | ICD-10-CM | POA: Diagnosis present

## 2021-06-25 DIAGNOSIS — I252 Old myocardial infarction: Secondary | ICD-10-CM | POA: Diagnosis not present

## 2021-06-25 DIAGNOSIS — I10 Essential (primary) hypertension: Secondary | ICD-10-CM | POA: Diagnosis not present

## 2021-06-25 DIAGNOSIS — Z635 Disruption of family by separation and divorce: Secondary | ICD-10-CM | POA: Diagnosis not present

## 2021-06-25 DIAGNOSIS — Z634 Disappearance and death of family member: Secondary | ICD-10-CM | POA: Diagnosis not present

## 2021-06-25 DIAGNOSIS — Y831 Surgical operation with implant of artificial internal device as the cause of abnormal reaction of the patient, or of later complication, without mention of misadventure at the time of the procedure: Secondary | ICD-10-CM | POA: Diagnosis present

## 2021-06-25 DIAGNOSIS — Z7982 Long term (current) use of aspirin: Secondary | ICD-10-CM | POA: Diagnosis not present

## 2021-06-25 DIAGNOSIS — G4733 Obstructive sleep apnea (adult) (pediatric): Secondary | ICD-10-CM | POA: Diagnosis present

## 2021-06-25 LAB — CBC
HCT: 44.6 % (ref 39.0–52.0)
Hemoglobin: 14.6 g/dL (ref 13.0–17.0)
MCH: 30.9 pg (ref 26.0–34.0)
MCHC: 32.7 g/dL (ref 30.0–36.0)
MCV: 94.3 fL (ref 80.0–100.0)
Platelets: 251 10*3/uL (ref 150–400)
RBC: 4.73 MIL/uL (ref 4.22–5.81)
RDW: 14.7 % (ref 11.5–15.5)
WBC: 10.2 10*3/uL (ref 4.0–10.5)
nRBC: 0 % (ref 0.0–0.2)

## 2021-06-25 LAB — GLUCOSE, CAPILLARY
Glucose-Capillary: 186 mg/dL — ABNORMAL HIGH (ref 70–99)
Glucose-Capillary: 196 mg/dL — ABNORMAL HIGH (ref 70–99)
Glucose-Capillary: 202 mg/dL — ABNORMAL HIGH (ref 70–99)
Glucose-Capillary: 215 mg/dL — ABNORMAL HIGH (ref 70–99)

## 2021-06-25 LAB — HEPARIN LEVEL (UNFRACTIONATED): Heparin Unfractionated: 0.33 IU/mL (ref 0.30–0.70)

## 2021-06-25 MED ORDER — HEPARIN SODIUM (PORCINE) 5000 UNIT/ML IJ SOLN
5000.0000 [IU] | Freq: Three times a day (TID) | INTRAMUSCULAR | Status: DC
  Administered 2021-06-25 – 2021-06-29 (×10): 5000 [IU] via SUBCUTANEOUS
  Filled 2021-06-25 (×9): qty 1

## 2021-06-25 MED ORDER — ISOSORBIDE MONONITRATE ER 30 MG PO TB24
30.0000 mg | ORAL_TABLET | Freq: Every day | ORAL | Status: DC
  Administered 2021-06-25 – 2021-06-28 (×4): 30 mg via ORAL
  Filled 2021-06-25 (×4): qty 1

## 2021-06-25 NOTE — Care Management (Signed)
1211 06-25-21 Case Manager received a consult for medication assistance. Patient currently gets Medicaid and the medications cost $4.00. Case Manager is unable to assist with medications cost if the patient is insured. Case Manager will continue to follow for additional transition of care needs.

## 2021-06-25 NOTE — Progress Notes (Signed)
Pt had an episode of chest pain 7/10 relieved with 1 SL NTG. No acute EKG changes.  Pt currently requesting juice and crackers.

## 2021-06-25 NOTE — Progress Notes (Signed)
ANTICOAGULATION CONSULT NOTE  Pharmacy Consult for Heparin Indication: chest pain/ACS  No Known Allergies  Patient Measurements: Height: 5' 7.5" (171.5 cm) Weight:  (weight declined at this time; wanted to wait until he gets up to chair) IBW/kg (Calculated) : 67.25 Heparin Dosing Weight: 105 kg  Vital Signs: Temp: 98 F (36.7 C) (10/03 0757) Temp Source: Oral (10/03 0757) BP: 118/80 (10/03 0757) Pulse Rate: 79 (10/03 0757)  Labs: Recent Labs    06/23/21 1900 06/23/21 1900 06/23/21 2133 06/24/21 0422 06/24/21 1148 06/24/21 1806 06/25/21 0154  HGB 16.6  --   --  14.9  --   --  14.6  HCT 51.3  --   --  44.8  --   --  44.6  PLT 249  --   --  258  --   --  251  LABPROT  --   --   --  12.8  --   --   --   INR  --   --   --  1.0  --   --   --   HEPARINUNFRC  --    < >  --  0.11* 0.36 0.27* 0.33  CREATININE 1.15  --   --  1.14  --   --   --   TROPONINIHS 37*  --  36*  --   --   --   --    < > = values in this interval not displayed.     Estimated Creatinine Clearance: 105.7 mL/min (by C-G formula based on SCr of 1.14 mg/dL).   Medical History: Past Medical History:  Diagnosis Date   Anemia    Arthritis    Asthma    Bone spur of ankle    right    CHF (congestive heart failure) (HCC)    COPD (chronic obstructive pulmonary disease) (HCC)    Coronary artery disease    Depression    Diabetes (HCC)    borderline    GERD (gastroesophageal reflux disease)    Hyperlipidemia    Hypertension    Myocardial infarction (HCC)    Pneumonia    HX OF PNA   Right rotator cuff tear 01/06/2018   Shortness of breath dyspnea    Sleep apnea    USES CPAP      Assessment: 55 y.o. M presents with SOB. To begin heparin for r/o ACS. No AC PTA. Plans are to pursue medical management and observing symptom burden. May consider stress testing if symptoms persist after achieving BP control. Pharmacy to dose heparin.   Heparin level therapeutic, CBC stable.  Goal of Therapy:   Heparin level 0.3-0.7 units/ml Monitor platelets by anticoagulation protocol: Yes   Plan:  Continue heparin 2000 units/h Daily heparin level and CBC  Fredonia Highland, PharmD, [place], [facility] Clinical Pharmacist [phone] Please check AMION for all [facility] Pharmacy numbers 06/25/2021

## 2021-06-25 NOTE — Progress Notes (Signed)
Pt had 4 beats of Vtach , Pt was asymptomatic. Bavinkumar Bhagat PA-C updated via AMION.

## 2021-06-25 NOTE — Progress Notes (Addendum)
Progress Note  Patient Name: Chris Powell Date of Encounter: 06/25/2021  [facility] Cardiologist: Kristeen Miss, MD   Subjective   Had chest pain this morning at rest which lasted ~30-40 minutes and resolved with SL nitro. He has been under increased stress over the past few months with the death of 2 siblings (sister died from MI at 52 and brother died from overdose at 66). He has poor coping mechanisms and reports he has used cocaine 3 times in the past month, including once within the past week. He has seen psychiatry in the past but states it wasn't very helpful.   Inpatient Medications    Scheduled Meds:  amLODipine  10 mg Oral Daily   aspirin EC  81 mg Oral Daily   atorvastatin  80 mg Oral q1800   carvedilol  25 mg Oral BID WC   cloNIDine  0.3 mg Oral BID   furosemide  40 mg Oral Daily   gabapentin  600 mg Oral TID   hydrALAZINE  50 mg Oral TID   influenza vac split quadrivalent PF  0.5 mL Intramuscular Tomorrow-1000   insulin aspart  0-9 Units Subcutaneous TID WC   pantoprazole  40 mg Oral Daily   spironolactone  50 mg Oral Daily   Continuous Infusions:  heparin 2,000 Units/hr (06/24/21 2147)   PRN Meds: acetaminophen, albuterol, nitroGLYCERIN, ondansetron (ZOFRAN) IV   Vital Signs    Vitals:   06/24/21 2101 06/24/21 2324 06/25/21 0333 06/25/21 0757  BP: 115/62 136/69 125/62 118/80  Pulse: 75 72 73 79  Resp: 17 20 20 20   Temp: 98.2 F (36.8 C) 98.3 F (36.8 C) 97.9 F (36.6 C) 98 F (36.7 C)  TempSrc: Oral Oral Oral Oral  SpO2: 97% 94% 97%   Weight:      Height:        Intake/Output Summary (Last 24 hours) at 06/25/2021 0958 Last data filed at 06/25/2021 0352 Gross per 24 hour  Intake 1391.64 ml  Output 700 ml  Net 691.64 ml   Last 3 Weights 06/25/2021 06/23/2021 02/17/2019  Weight (lbs) (No Data) 340 lb 334 lb  Weight (kg) (No Data) 154.223 kg 151.501 kg      Telemetry    Sinus rhythm with occasional PACs/PVCs - Personally Reviewed  ECG     Sinus rhythm, rate 74 bpm, inferolateral T wave abnormalities unchanged from previous, no STE/D - Personally Reviewed  Physical Exam   GEN: Morbidly obese gentleman laying in bed in no acute distress.   Neck: difficult to assess JVD due to body habitus Cardiac: RRR, no murmurs, rubs, or gallops.  Respiratory: Clear to auscultation bilaterally. GI: Soft, obese, nontender, non-distended  MS: 1+ LE edema; No deformity. Neuro:  Nonfocal  Psych: Normal affect   Labs    High Sensitivity Troponin:   Recent Labs  Lab 06/23/21 1900 06/23/21 2133  TROPONINIHS 37* 36*     Chemistry Recent Labs  Lab 06/23/21 1900 06/24/21 0422  NA 139 137  K 3.8 3.5  CL 104 103  CO2 25 25  GLUCOSE 119* 171*  BUN 17 15  CREATININE 1.15 1.14  CALCIUM 9.5 9.2  PROT 8.2*  --   ALBUMIN 4.0  --   AST 19  --   ALT 23  --   ALKPHOS 67  --   BILITOT 0.4  --   GFRNONAA >60 >60  ANIONGAP 10 9    Lipids  Recent Labs  Lab 06/24/21 0422  CHOL  188  TRIG 125  HDL 35*  LDLCALC 128*  CHOLHDL 5.4    Hematology Recent Labs  Lab 06/23/21 1900 06/24/21 0422 06/25/21 0154  WBC 9.2 8.9 10.2  RBC 5.30 4.83 4.73  HGB 16.6 14.9 14.6  HCT 51.3 44.8 44.6  MCV 96.8 92.8 94.3  MCH 31.3 30.8 30.9  MCHC 32.4 33.3 32.7  RDW 14.7 14.6 14.7  PLT 249 258 251   Thyroid No results for input(s): TSH, FREET4 in the last 168 hours.  BNP Recent Labs  Lab 06/23/21 1900 06/24/21 0422  BNP 185.0* 35.6    DDimer No results for input(s): DDIMER in the last 168 hours.   Radiology    DG Chest 2 View  Result Date: 06/23/2021 CLINICAL DATA:  Shortness of breath. EXAM: CHEST - 2 VIEW COMPARISON:  Chest x-ray 01/06/2019. FINDINGS: The heart is mildly enlarged. There central pulmonary vascular congestion. There are minimal interstitial opacities in the lung bases. There is no focal lung infiltrate. Costophrenic angles are clear. There is no pneumothorax or acute fracture. IMPRESSION: 1. Cardiomegaly with mild  interstitial edema. Electronically Signed   By: Darliss Cheney M.D.   On: 06/23/2021 18:37   ECHOCARDIOGRAM COMPLETE  Result Date: 06/24/2021    ECHOCARDIOGRAM REPORT   Patient Name:   Chris Powell Date of Exam: 06/24/2021 Medical Rec #:  [id number]        Height:       67.5 in Accession #:    [id number]       Weight:       340.0 lb Date of Birth:  [birth year]         BSA:          2.548 m Patient Age:    55 years         BP:           133/80 mmHg Patient Gender: M                HR:           69 bpm. Exam Location:  Inpatient Procedure: 2D Echo, Cardiac Doppler, Color Doppler and Intracardiac            Opacification Agent Indications:    Chest Pain R07.9  History:        Patient has prior history of Echocardiogram examinations, most                 recent 01/06/2019. CAD, COPD; Risk Factors:Hypertension,                 Diabetes, Dyslipidemia and Morbid Obesity.  Sonographer:    Roosvelt Maser RDCS Referring Phys: [id number] DENNIS NARCISSE JR IMPRESSIONS  1. Poor acoustical windows even with definity. Left ventricular ejection fraction, by estimation, is 50 to 55%. The left ventricle has low normal function. The left ventricle has no regional wall motion abnormalities. There is moderate concentric left ventricular hypertrophy. Left ventricular diastolic parameters are consistent with Grade II diastolic dysfunction (pseudonormalization).  2. Right ventricular systolic function is normal. The right ventricular size is mildly enlarged. Tricuspid regurgitation signal is inadequate for assessing PA pressure.  3. Left atrial size was moderately dilated.  4. The mitral valve is normal in structure. No evidence of mitral valve regurgitation. No evidence of mitral stenosis.  5. The aortic valve is normal in structure. Aortic valve regurgitation is not visualized. No aortic stenosis is present.  6. The inferior vena cava is normal in size with greater  than 50% respiratory variability, suggesting right atrial pressure of 3  mmHg. FINDINGS  Left Ventricle: Poor acoustical windows even with definity. Left ventricular ejection fraction, by estimation, is 50 to 55%. The left ventricle has low normal function. The left ventricle has no regional wall motion abnormalities. Definity contrast agent was given IV to delineate the left ventricular endocardial borders. The left ventricular internal cavity size was normal in size. There is moderate concentric left ventricular hypertrophy. Left ventricular diastolic parameters are consistent with Grade II diastolic dysfunction (pseudonormalization). Normal left ventricular filling pressure. Right Ventricle: The right ventricular size is mildly enlarged. No increase in right ventricular wall thickness. Right ventricular systolic function is normal. Tricuspid regurgitation signal is inadequate for assessing PA pressure. Left Atrium: Left atrial size was moderately dilated. Right Atrium: Right atrial size was normal in size. Pericardium: There is no evidence of pericardial effusion. Mitral Valve: The mitral valve is normal in structure. No evidence of mitral valve regurgitation. No evidence of mitral valve stenosis. Tricuspid Valve: The tricuspid valve is normal in structure. Tricuspid valve regurgitation is not demonstrated. No evidence of tricuspid stenosis. Aortic Valve: The aortic valve is normal in structure. Aortic valve regurgitation is not visualized. No aortic stenosis is present. Aortic valve mean gradient measures 4.0 mmHg. Aortic valve peak gradient measures 8.1 mmHg. Aortic valve area, by VTI measures 2.47 cm. Pulmonic Valve: The pulmonic valve was normal in structure. Pulmonic valve regurgitation is not visualized. No evidence of pulmonic stenosis. Aorta: The aortic root is normal in size and structure. Venous: The inferior vena cava is normal in size with greater than 50% respiratory variability, suggesting right atrial pressure of 3 mmHg. IAS/Shunts: No atrial level shunt detected by  color flow Doppler.  LEFT VENTRICLE PLAX 2D LVIDd:         5.20 cm  Diastology LVIDs:         3.40 cm  LV e' medial:    4.90 cm/s LV PW:         1.50 cm  LV E/e' medial:  15.2 LV IVS:        1.40 cm  LV e' lateral:   7.18 cm/s LVOT diam:     2.30 cm  LV E/e' lateral: 10.4 LV SV:         69 LV SV Index:   27 LVOT Area:     4.15 cm  RIGHT VENTRICLE RV Basal diam:  4.30 cm RV Mid diam:    3.90 cm LEFT ATRIUM              Index       RIGHT ATRIUM           Index LA diam:        4.20 cm  1.65 cm/m  RA Area:     21.40 cm LA Vol (A2C):   119.0 ml 46.71 ml/m RA Volume:   68.80 ml  27.00 ml/m LA Vol (A4C):   113.0 ml 44.35 ml/m LA Biplane Vol: 115.0 ml 45.14 ml/m  AORTIC VALVE AV Area (Vmax):    2.45 cm AV Area (Vmean):   2.24 cm AV Area (VTI):     2.47 cm AV Vmax:           142.00 cm/s AV Vmean:          97.100 cm/s AV VTI:            0.277 m AV Peak Grad:      8.1 mmHg AV Mean  Grad:      4.0 mmHg LVOT Vmax:         83.60 cm/s LVOT Vmean:        52.300 cm/s LVOT VTI:          0.165 m LVOT/AV VTI ratio: 0.60  AORTA Ao Root diam: 3.20 cm MITRAL VALVE MV Area (PHT): 3.16 cm    SHUNTS MV Decel Time: 240 msec    Systemic VTI:  0.16 m MV E velocity: 74.60 cm/s  Systemic Diam: 2.30 cm MV A velocity: 78.80 cm/s MV E/A ratio:  0.95 Armanda Magic MD Electronically signed by Armanda Magic MD Signature Date/Time: 06/24/2021/12:41:27 PM    Final     Cardiac Studies   Echocardiogram 06/24/21: 1. Poor acoustical windows even with definity. Left ventricular ejection  fraction, by estimation, is 50 to 55%. The left ventricle has low normal  function. The left ventricle has no regional wall motion abnormalities.  There is moderate concentric left  ventricular hypertrophy. Left ventricular diastolic parameters are  consistent with Grade II diastolic dysfunction (pseudonormalization).   2. Right ventricular systolic function is normal. The right ventricular  size is mildly enlarged. Tricuspid regurgitation signal is  inadequate for  assessing PA pressure.   3. Left atrial size was moderately dilated.   4. The mitral valve is normal in structure. No evidence of mitral valve  regurgitation. No evidence of mitral stenosis.   5. The aortic valve is normal in structure. Aortic valve regurgitation is  not visualized. No aortic stenosis is present.   6. The inferior vena cava is normal in size with greater than 50%  respiratory variability, suggesting right atrial pressure of 3 mmHg.   LHC 12/2020  Non-stenotic Prox LAD-1 lesion was previously treated. Prox LAD-2 lesion is 50% stenosed. 1st Mrg lesion is 80% stenosed. Prox RCA to Mid RCA lesion is 99% stenosed. Mid RCA lesion is 30% stenosed. Ost 1st Mrg lesion is 90% stenosed. Ost LAD to Prox LAD lesion is 90% stenosed. Mid LAD to Dist LAD lesion is 60% stenosed.   1. 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 Volume overload with moderate pulmonary venous HTN  Given morbid obesity and diffuse distal LAD disease not candidate for CABG. Films reviewed with Dr. Excell Seltzer. Plan PCI of RCA and LAD on Tuesday. Will diurese. Watch creatinine closely. Load Plavix.     Patient Profile     55 y.o. 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   Assessment & Plan    1. CP in patient with known CAD s/p PCI/DES to LAD and RCA most recently in 2020: patient has had intermittent chest pain for 1+ week, mostly occurring at rest. He has been under increased stress recently with the loss of 2 younger siblings (sister from MI at 51yo and brother from overdose at 31yo). He reports intermittently using cocaine as a coping strategy (3 times in the past month with last use  within the past week). He has also had medication non-compliance which he blames on [facility] inconsistencies, though suspect there is a financial constraint also contributing. He has not been seen by cardiology since  virtual post-NSTEMI hospital follow-up 01/2019. BP was elevated on admission, now normalized. He had another episode of CP this morning which resolved in 30-40 min after SL nitro administration. EKG has chronic inferolateral TWI, otherwise non-ischemic. HsTrops were mildly elevated in the 30s - low flat trend not c/w ACS. Echo this admission showed EF 50-55% (previously 40-45% 12/2020). G2DD, moderate concentric LVH, mild RV enlargement, moderate LAE, and no significant valvular abnormalities.  - Continue aspirin and statin - Continue Bblocker - Will discuss further ischemic testing with Dr. Bjorn Pippin given ongoing CP complaints. Do worry about compliance if DAPT is warranted in the future.   2. Chronic combined CHF: Patient with history of ICM with EF 40-45% at the time of NSTEMI 12/2020. He had stenting to LAD and RCA at that time but no follow-up echo to re-evaluate LV function as he was lost to follow-up. Echo this admission with improvement in EF to 50-55%, G2DD, no RWMA. Volume status is difficult to gauge with his morbid obesity. CXR showed cardiomegaly with mild interstitial edema. He has 1+ pitting of LE on exam today. He was given IV lasix in the ED and continued on home po lasix 40mg  daily.  - Continue carvedilol and hydralazine - Continue spironolactone - Continue po lasix 40mg  daily - Continue to monitor strict I&Os and daily weights - Continue to monitor electrolytes and replete as needed to maintain K >4, Mg >2  3. HTN: BP elevated on admission. Improved with restarting home medications at lower doses. He reported being off medications for at least 1 week. BP improved and well controlled at this time - Continue amlodipine, carvedilol, clonidine, lasix, hydralazine (50 TID as opposed  to 100mg  TID at home), and spironolactone.  - Home isordil not restarted   4. HLD: LDL 128 on lipids this admission. He reports being out of his atorvastatin for at least 1 week - Continue atorvastatin 80mg  daily - Low threshold to refer to lipid clinic if LDL remains >70 on next labs and compliance assured  5. Cocaine abuse: he reports using cocaine 3x in the past month, including once in the past week. Unclear how much this has contributed to #1. We discussed importance of cessation and recommendations to see psychiatry outpatient to develop healthier coping strategies to deal with his grief.  - Continue to encourage cessation  6. Tobacco abuse: still smoking - Continue to encourage cessation  7. GERD: stable - Continue pantoprazole  8. Joint pains: reports left shoulder and right hip pain. Has known arthritis and recommended for right hip replacement. He wants to get imaging of his left shoulder this admission though has not had any recent acute injuries - Will defer non-emergent imaging to PCP outpatient - Continue gabapentin for neuropathy pains  For questions or updates, please contact [facility] Please consult [email or web address] for contact info under        Signed, , PA-C  06/25/2021, 9:58 AM    Patient seen and examined.  Agree with above documentation.  On exam, patient is somnolent but arousable, regular rate and rhythm, no murmurs, lungs CTAB, no LE edema, JVD difficult to assess given habitus.  Troponins were low and flat, not consistent with ACS.  Given his coronary history, would recommend outpatient stress test.  In the meantime we can titrate antianginal regimen, start Imdur 30 mg daily.  We will need to ensure patient can get his medications on discharge.  , MD

## 2021-06-26 ENCOUNTER — Inpatient Hospital Stay (HOSPITAL_COMMUNITY)
Admission: EM | Disposition: A | Discharge status: Home / Self Care | Payer: Self-pay | Source: Home / Self Care | Attending: Internal Medicine

## 2021-06-26 DIAGNOSIS — I5042 Chronic combined systolic (congestive) and diastolic (congestive) heart failure: Secondary | ICD-10-CM | POA: Diagnosis not present

## 2021-06-26 DIAGNOSIS — R079 Chest pain, unspecified: Secondary | ICD-10-CM | POA: Diagnosis not present

## 2021-06-26 DIAGNOSIS — I25110 Atherosclerotic heart disease of native coronary artery with unstable angina pectoris: Principal | ICD-10-CM

## 2021-06-26 HISTORY — PX: LEFT HEART CATH AND CORONARY ANGIOGRAPHY: CATH118249

## 2021-06-26 HISTORY — PX: CORONARY STENT INTERVENTION: CATH118234

## 2021-06-26 LAB — GLUCOSE, CAPILLARY
Glucose-Capillary: 165 mg/dL — ABNORMAL HIGH (ref 70–99)
Glucose-Capillary: 134 mg/dL — ABNORMAL HIGH (ref 70–99)
Glucose-Capillary: 116 mg/dL — ABNORMAL HIGH (ref 70–99)
Glucose-Capillary: 162 mg/dL — ABNORMAL HIGH (ref 70–99)

## 2021-06-26 LAB — POCT ACTIVATED CLOTTING TIME
Activated Clotting Time: 242 seconds
Activated Clotting Time: 208 seconds
Activated Clotting Time: 173 seconds
Activated Clotting Time: 202 seconds

## 2021-06-26 LAB — HEPARIN LEVEL (UNFRACTIONATED): Heparin Unfractionated: <0.1 IU/mL — ABNORMAL LOW (ref 0.30–0.70)

## 2021-06-26 LAB — CBC
HCT: 42.9 % (ref 39.0–52.0)
Hemoglobin: 13.9 g/dL (ref 13.0–17.0)
MCH: 31 pg (ref 26.0–34.0)
MCHC: 32.4 g/dL (ref 30.0–36.0)
MCV: 95.5 fL (ref 80.0–100.0)
Platelets: 244 10*3/uL (ref 150–400)
RBC: 4.49 MIL/uL (ref 4.22–5.81)
RDW: 14.6 % (ref 11.5–15.5)
WBC: 8 10*3/uL (ref 4.0–10.5)
nRBC: 0 % (ref 0.0–0.2)

## 2021-06-26 SURGERY — LEFT HEART CATH AND CORONARY ANGIOGRAPHY
Anesthesia: LOCAL

## 2021-06-26 MED ORDER — LABETALOL HCL 5 MG/ML IV SOLN
INTRAVENOUS | Status: AC
  Filled 2021-06-26: qty 4

## 2021-06-26 MED ORDER — ONDANSETRON HCL 4 MG/2ML IJ SOLN: 4.0000 mg | Freq: Four times a day (QID) | INTRAMUSCULAR | Status: DC | PRN

## 2021-06-26 MED ORDER — HEPARIN (PORCINE) IN NACL 1000-0.9 UT/500ML-% IV SOLN
INTRAVENOUS | Status: AC
  Filled 2021-06-26: qty 1000

## 2021-06-26 MED ORDER — LIDOCAINE HCL (PF) 1 % IJ SOLN
INTRAMUSCULAR | Status: DC | PRN
  Administered 2021-06-26: 14 mL via SUBCUTANEOUS

## 2021-06-26 MED ORDER — FUROSEMIDE 10 MG/ML IJ SOLN
INTRAMUSCULAR | Status: AC
  Filled 2021-06-26: qty 4

## 2021-06-26 MED ORDER — SODIUM CHLORIDE 0.9 % IV SOLN: 250.0000 mL | INTRAVENOUS | Status: DC | PRN

## 2021-06-26 MED ORDER — FENTANYL CITRATE (PF) 100 MCG/2ML IJ SOLN
INTRAMUSCULAR | Status: DC | PRN
  Administered 2021-06-26 (×2): 25 ug via INTRAVENOUS
  Administered 2021-06-26: 50 ug via INTRAVENOUS
  Administered 2021-06-26 (×2): 25 ug via INTRAVENOUS

## 2021-06-26 MED ORDER — TICAGRELOR 90 MG PO TABS
90.0000 mg | ORAL_TABLET | Freq: Two times a day (BID) | ORAL | Status: DC
  Administered 2021-06-27 – 2021-06-29 (×5): 90 mg via ORAL
  Filled 2021-06-26 (×5): qty 1

## 2021-06-26 MED ORDER — SODIUM CHLORIDE 0.9 % IV SOLN: INTRAVENOUS | Status: DC

## 2021-06-26 MED ORDER — HEPARIN SODIUM (PORCINE) 1000 UNIT/ML IJ SOLN
INTRAMUSCULAR | Status: AC
  Filled 2021-06-26: qty 1

## 2021-06-26 MED ORDER — MIDAZOLAM HCL 2 MG/2ML IJ SOLN
INTRAMUSCULAR | Status: AC
  Filled 2021-06-26: qty 2

## 2021-06-26 MED ORDER — ACETAMINOPHEN 325 MG PO TABS: 650.0000 mg | ORAL_TABLET | ORAL | Status: DC | PRN

## 2021-06-26 MED ORDER — HEPARIN SODIUM (PORCINE) 1000 UNIT/ML IJ SOLN
INTRAMUSCULAR | Status: DC | PRN
  Administered 2021-06-26: 15000 [IU] via INTRAVENOUS
  Administered 2021-06-26: 3000 [IU] via INTRAVENOUS

## 2021-06-26 MED ORDER — TICAGRELOR 90 MG PO TABS
ORAL_TABLET | ORAL | Status: DC | PRN
  Administered 2021-06-26: 180 mg via ORAL

## 2021-06-26 MED ORDER — LIDOCAINE HCL (PF) 1 % IJ SOLN
INTRAMUSCULAR | Status: AC
  Filled 2021-06-26: qty 30

## 2021-06-26 MED ORDER — LIP MEDEX EX OINT
TOPICAL_OINTMENT | CUTANEOUS | Status: DC | PRN
  Filled 2021-06-26: qty 7

## 2021-06-26 MED ORDER — SODIUM CHLORIDE 0.9% FLUSH: 3.0000 mL | INTRAVENOUS | Status: DC | PRN

## 2021-06-26 MED ORDER — FENTANYL CITRATE (PF) 100 MCG/2ML IJ SOLN
INTRAMUSCULAR | Status: AC
  Filled 2021-06-26: qty 2

## 2021-06-26 MED ORDER — SODIUM CHLORIDE 0.9% FLUSH
3.0000 mL | Freq: Two times a day (BID) | INTRAVENOUS | Status: DC
  Administered 2021-06-27 – 2021-06-29 (×4): 3 mL via INTRAVENOUS

## 2021-06-26 MED ORDER — SALINE SPRAY 0.65 % NA SOLN
1.0000 | NASAL | Status: DC | PRN
  Filled 2021-06-26: qty 44

## 2021-06-26 MED ORDER — HYDRALAZINE HCL 20 MG/ML IJ SOLN: 10.0000 mg | INTRAMUSCULAR | Status: AC | PRN

## 2021-06-26 MED ORDER — MIDAZOLAM HCL 2 MG/2ML IJ SOLN
INTRAMUSCULAR | Status: DC | PRN
  Administered 2021-06-26 (×4): 1 mg via INTRAVENOUS

## 2021-06-26 MED ORDER — SODIUM CHLORIDE 0.9% FLUSH
3.0000 mL | Freq: Two times a day (BID) | INTRAVENOUS | Status: DC
  Administered 2021-06-26 – 2021-06-29 (×5): 3 mL via INTRAVENOUS

## 2021-06-26 MED ORDER — FUROSEMIDE 10 MG/ML IJ SOLN
40.0000 mg | Freq: Once | INTRAMUSCULAR | Status: AC
  Administered 2021-06-26: 40 mg via INTRAVENOUS

## 2021-06-26 MED ORDER — IOHEXOL 350 MG/ML SOLN
INTRAVENOUS | Status: DC | PRN
  Administered 2021-06-26: 190 mL via INTRA_ARTERIAL

## 2021-06-26 MED ORDER — LABETALOL HCL 5 MG/ML IV SOLN
10.0000 mg | INTRAVENOUS | Status: AC | PRN
  Administered 2021-06-26: 10 mg via INTRAVENOUS

## 2021-06-26 MED ORDER — NITROGLYCERIN 1 MG/10 ML FOR IR/CATH LAB
INTRA_ARTERIAL | Status: DC | PRN
  Administered 2021-06-26: 200 ug via INTRACORONARY

## 2021-06-26 MED ORDER — LABETALOL HCL 5 MG/ML IV SOLN
INTRAVENOUS | Status: DC | PRN
  Administered 2021-06-26: 10 mg via INTRAVENOUS

## 2021-06-26 MED ORDER — NITROGLYCERIN 1 MG/10 ML FOR IR/CATH LAB
INTRA_ARTERIAL | Status: AC
  Filled 2021-06-26: qty 10

## 2021-06-26 MED ORDER — SODIUM CHLORIDE 0.9 % IV SOLN: INTRAVENOUS | Status: AC

## 2021-06-26 SURGICAL SUPPLY — 21 items
BALL SAPPHIRE NC24 3.0X22 (BALLOONS) ×2
BALLN SAPPHIRE 2.5X15 (BALLOONS) ×2
BALLOON SAPPHIRE 2.5X15 (BALLOONS) ×1 IMPLANT
BALLOON SAPPHIRE NC24 3.0X22 (BALLOONS) ×1 IMPLANT
CATH INFINITI 5 FR AL2 (CATHETERS) ×2 IMPLANT
CATH INFINITI 5FR AL1 (CATHETERS) ×2 IMPLANT
CATH INFINITI 5FR MULTPACK ANG (CATHETERS) ×2 IMPLANT
CATH VISTA GUIDE 6FR XBLAD4 (CATHETERS) ×2 IMPLANT
KIT ENCORE 26 ADVANTAGE (KITS) ×2 IMPLANT
KIT HEART LEFT (KITS) ×2 IMPLANT
MAT PREVALON FULL STRYKER (MISCELLANEOUS) ×2 IMPLANT
PACK CARDIAC CATHETERIZATION (CUSTOM PROCEDURE TRAY) ×2 IMPLANT
SHEATH PINNACLE 5F 10CM (SHEATH) ×2 IMPLANT
SHEATH PINNACLE 6F 10CM (SHEATH) ×2 IMPLANT
SHEATH PROBE COVER 6X72 (BAG) ×2 IMPLANT
STENT ONYX FRONTIER 2.5X34 (Permanent Stent) ×2 IMPLANT
SYR MEDRAD MARK 7 150ML (SYRINGE) ×2 IMPLANT
TRANSDUCER W/STOPCOCK (MISCELLANEOUS) ×2 IMPLANT
TUBING CIL FLEX 10 FLL-RA (TUBING) ×2 IMPLANT
WIRE ASAHI PROWATER 180CM (WIRE) ×2 IMPLANT
WIRE J 3MM .035X145CM (WIRE) ×2 IMPLANT

## 2021-06-26 NOTE — Interval H&P Note (Signed)
History and Physical Interval Note:  06/26/2021 3:45 PM  Chris Powell  has presented today for surgery, with the diagnosis of unstable angina.  The various methods of treatment have been discussed with the patient and family. After consideration of risks, benefits and other options for treatment, the patient has consented to  Procedure(s): LEFT HEART CATH AND CORONARY ANGIOGRAPHY (N/A)  PERCUTANEOUS CORONARY INTERVENTION   as a surgical intervention.  The patient's history has been reviewed, patient examined, no change in status, stable for surgery.  I have reviewed the patient's chart and labs.  Questions were answered to the patient's satisfaction.    Cath Lab Visit (complete for each Cath Lab visit)  Clinical Evaluation Leading to the Procedure:   ACS: Yes.    Non-ACS:    Anginal Classification: CCS III  Anti-ischemic medical therapy: Maximal Therapy (2 or more classes of medications)  Non-Invasive Test Results: No non-invasive testing performed  Prior CABG: No previous CABG   Bryan Lemma

## 2021-06-26 NOTE — H&P (View-Only) (Signed)
Progress Note  Patient Name: Chris Powell Date of Encounter: 06/26/2021  [facility] Cardiologist: Kristeen Miss, MD   Subjective   Reported another episode of chest discomfort overnight which lasted 1 hour. He was under increased emotional stress as his girlfriend was not allowed to come up to his room when she arrived after visiting hours. Episode resolved without intervention. No complaints of palpitations or SOB.   Inpatient Medications    Scheduled Meds:  amLODipine  10 mg Oral Daily   aspirin EC  81 mg Oral Daily   atorvastatin  80 mg Oral q1800   carvedilol  25 mg Oral BID WC   cloNIDine  0.3 mg Oral BID   furosemide  40 mg Oral Daily   gabapentin  600 mg Oral TID   heparin injection (subcutaneous)  5,000 Units Subcutaneous Q8H   hydrALAZINE  50 mg Oral TID   insulin aspart  0-9 Units Subcutaneous TID WC   isosorbide mononitrate  30 mg Oral Daily   pantoprazole  40 mg Oral Daily   spironolactone  50 mg Oral Daily   Continuous Infusions:  PRN Meds: acetaminophen, albuterol, nitroGLYCERIN, ondansetron (ZOFRAN) IV   Vital Signs    Vitals:   06/25/21 1608 06/25/21 1647 06/25/21 1959 06/26/21 0624  BP: 113/64  (!) 115/49 140/67  Pulse:  73 79 85  Resp:   20   Temp:  98.2 F (36.8 C) 99 F (37.2 C) 98.3 F (36.8 C)  TempSrc:  Oral Oral Oral  SpO2:  97% 97%   Weight:    (!) 169.8 kg  Height:        Intake/Output Summary (Last 24 hours) at 06/26/2021 0818 Last data filed at 06/25/2021 2330 Gross per 24 hour  Intake --  Output 850 ml  Net -850 ml   Last 3 Weights 06/26/2021 06/25/2021 06/23/2021  Weight (lbs) 374 lb 5.5 oz (No Data) 340 lb  Weight (kg) 169.8 kg (No Data) 154.223 kg      Telemetry    Sinus rhythm with 4 beat run of NSVT, occasional PACs/PVCs - Personally Reviewed  ECG    No new tracings - Personally Reviewed  Physical Exam   GEN: Morbidly obese, sitting in bedside chair in NAD.   Neck: No JVD Cardiac: RRR, no murmurs, rubs,  or gallops.  Respiratory: Clear to auscultation bilaterally. GI: Soft, obese, nontender, non-distended  MS: trace LE edema; No deformity. Neuro:  Nonfocal  Psych: irritable   Labs    High Sensitivity Troponin:   Recent Labs  Lab 06/23/21 1900 06/23/21 2133  TROPONINIHS 37* 36*     Chemistry Recent Labs  Lab 06/23/21 1900 06/24/21 0422  NA 139 137  K 3.8 3.5  CL 104 103  CO2 25 25  GLUCOSE 119* 171*  BUN 17 15  CREATININE 1.15 1.14  CALCIUM 9.5 9.2  PROT 8.2*  --   ALBUMIN 4.0  --   AST 19  --   ALT 23  --   ALKPHOS 67  --   BILITOT 0.4  --   GFRNONAA >60 >60  ANIONGAP 10 9    Lipids  Recent Labs  Lab 06/24/21 0422  CHOL 188  TRIG 125  HDL 35*  LDLCALC 128*  CHOLHDL 5.4    Hematology Recent Labs  Lab 06/24/21 0422 06/25/21 0154 06/26/21 0155  WBC 8.9 10.2 8.0  RBC 4.83 4.73 4.49  HGB 14.9 14.6 13.9  HCT 44.8 44.6 42.9  MCV 92.8 94.3  95.5  MCH 30.8 30.9 31.0  MCHC 33.3 32.7 32.4  RDW 14.6 14.7 14.6  PLT 258 251 244   Thyroid No results for input(s): TSH, FREET4 in the last 168 hours.  BNP Recent Labs  Lab 06/23/21 1900 06/24/21 0422  BNP 185.0* 35.6    DDimer No results for input(s): DDIMER in the last 168 hours.   Radiology    ECHOCARDIOGRAM COMPLETE  Result Date: 06/24/2021    ECHOCARDIOGRAM REPORT   Patient Name:   Chris Powell Date of Exam: 06/24/2021 Medical Rec #:  [id number]        Height:       67.5 in Accession #:    [id number]       Weight:       340.0 lb Date of Birth:  08/04/1966         BSA:          2.548 m Patient Age:    55 years         BP:           133/80 mmHg Patient Gender: M                HR:           69 bpm. Exam Location:  Inpatient Procedure: 2D Echo, Cardiac Doppler, Color Doppler and Intracardiac            Opacification Agent Indications:    Chest Pain R07.9  History:        Patient has prior history of Echocardiogram examinations, most                 recent 01/06/2019. CAD, COPD; Risk Factors:Hypertension,                  Diabetes, Dyslipidemia and Morbid Obesity.  Sonographer:    Rachel Lane RDCS Referring Phys: [id number] Chris Powell IMPRESSIONS  1. Poor acoustical windows even with definity. Left ventricular ejection fraction, by estimation, is 50 to 55%. The left ventricle has low normal function. The left ventricle has no regional wall motion abnormalities. There is moderate concentric left ventricular hypertrophy. Left ventricular diastolic parameters are consistent with Grade II diastolic dysfunction (pseudonormalization).  2. Right ventricular systolic function is normal. The right ventricular size is mildly enlarged. Tricuspid regurgitation signal is inadequate for assessing PA pressure.  3. Left atrial size was moderately dilated.  4. The mitral valve is normal in structure. No evidence of mitral valve regurgitation. No evidence of mitral stenosis.  5. The aortic valve is normal in structure. Aortic valve regurgitation is not visualized. No aortic stenosis is present.  6. The inferior vena cava is normal in size with greater than 50% respiratory variability, suggesting right atrial pressure of 3 mmHg. FINDINGS  Left Ventricle: Poor acoustical windows even with definity. Left ventricular ejection fraction, by estimation, is 50 to 55%. The left ventricle has low normal function. The left ventricle has no regional wall motion abnormalities. Definity contrast agent was given IV to delineate the left ventricular endocardial borders. The left ventricular internal cavity size was normal in size. There is moderate concentric left ventricular hypertrophy. Left ventricular diastolic parameters are consistent with Grade II diastolic dysfunction (pseudonormalization). Normal left ventricular filling pressure. Right Ventricle: The right ventricular size is mildly enlarged. No increase in right ventricular wall thickness. Right ventricular systolic function is normal. Tricuspid regurgitation signal is inadequate for  assessing PA pressure. Left Atrium: Left atrial size was moderately dilated. Right   Atrium: Right atrial size was normal in size. Pericardium: There is no evidence of pericardial effusion. Mitral Valve: The mitral valve is normal in structure. No evidence of mitral valve regurgitation. No evidence of mitral valve stenosis. Tricuspid Valve: The tricuspid valve is normal in structure. Tricuspid valve regurgitation is not demonstrated. No evidence of tricuspid stenosis. Aortic Valve: The aortic valve is normal in structure. Aortic valve regurgitation is not visualized. No aortic stenosis is present. Aortic valve mean gradient measures 4.0 mmHg. Aortic valve peak gradient measures 8.1 mmHg. Aortic valve area, by VTI measures 2.47 cm. Pulmonic Valve: The pulmonic valve was normal in structure. Pulmonic valve regurgitation is not visualized. No evidence of pulmonic stenosis. Aorta: The aortic root is normal in size and structure. Venous: The inferior vena cava is normal in size with greater than 50% respiratory variability, suggesting right atrial pressure of 3 mmHg. IAS/Shunts: No atrial level shunt detected by color flow Doppler.  LEFT VENTRICLE PLAX 2D LVIDd:         5.20 cm  Diastology LVIDs:         3.40 cm  LV e' medial:    4.90 cm/s LV PW:         1.50 cm  LV E/e' medial:  15.2 LV IVS:        1.40 cm  LV e' lateral:   7.18 cm/s LVOT diam:     2.30 cm  LV E/e' lateral: 10.4 LV SV:         69 LV SV Index:   27 LVOT Area:     4.15 cm  RIGHT VENTRICLE RV Basal diam:  4.30 cm RV Mid diam:    3.90 cm LEFT ATRIUM              Index       RIGHT ATRIUM           Index LA diam:        4.20 cm  1.65 cm/m  RA Area:     21.40 cm LA Vol (A2C):   119.0 ml 46.71 ml/m RA Volume:   68.80 ml  27.00 ml/m LA Vol (A4C):   113.0 ml 44.35 ml/m LA Biplane Vol: 115.0 ml 45.14 ml/m  AORTIC VALVE AV Area (Vmax):    2.45 cm AV Area (Vmean):   2.24 cm AV Area (VTI):     2.47 cm AV Vmax:           142.00 cm/s AV Vmean:          97.100  cm/s AV VTI:            0.277 m AV Peak Grad:      8.1 mmHg AV Mean Grad:      4.0 mmHg LVOT Vmax:         83.60 cm/s LVOT Vmean:        52.300 cm/s LVOT VTI:          0.165 m LVOT/AV VTI ratio: 0.60  AORTA Ao Root diam: 3.20 cm MITRAL VALVE MV Area (PHT): 3.16 cm    SHUNTS MV Decel Time: 240 msec    Systemic VTI:  0.16 m MV E velocity: 74.60 cm/s  Systemic Diam: 2.30 cm MV A velocity: 78.80 cm/s MV E/A ratio:  0.95 Armanda Magic MD Electronically signed by Armanda Magic MD Signature Date/Time: 06/24/2021/12:41:27 PM    Final     Cardiac Studies   Echocardiogram 06/24/21: 1. Poor acoustical windows even with definity. Left ventricular  ejection  fraction, by estimation, is 50 to 55%. The left ventricle has low normal  function. The left ventricle has no regional wall motion abnormalities.  There is moderate concentric left  ventricular hypertrophy. Left ventricular diastolic parameters are  consistent with Grade II diastolic dysfunction (pseudonormalization).   2. Right ventricular systolic function is normal. The right ventricular  size is mildly enlarged. Tricuspid regurgitation signal is inadequate for  assessing PA pressure.   3. Left atrial size was moderately dilated.   4. The mitral valve is normal in structure. No evidence of mitral valve  regurgitation. No evidence of mitral stenosis.   5. The aortic valve is normal in structure. Aortic valve regurgitation is  not visualized. No aortic stenosis is present.   6. The inferior vena cava is normal in size with greater than 50%  respiratory variability, suggesting right atrial pressure of 3 mmHg.    LHC 12/2020   Non-stenotic Prox LAD-1 lesion was previously treated. Prox LAD-2 lesion is 50% stenosed. 1st Mrg lesion is 80% stenosed. Prox RCA to Mid RCA lesion is 99% stenosed. Mid RCA lesion is 30% stenosed. Ost 1st Mrg lesion is 90% stenosed. Ost LAD to Prox LAD lesion is 90% stenosed. Mid LAD to Dist LAD lesion is 60% stenosed.   1.  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 Volume overload with moderate pulmonary venous HTN   Given morbid obesity and diffuse distal LAD disease not candidate for CABG. Films reviewed with Dr. Excell Seltzer. Plan PCI of RCA and LAD on Tuesday. Will diurese. Watch creatinine closely. Load Plavix.       Patient Profile     55 y.o. 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   Assessment & Plan    1. Unstable angina with known CAD s/p PCI/DES to LAD and RCA most recently in 2020: he presented with intermittent chest pain often occurring under emotional distress. He has continued to have episodes this admission despite appropriate medication management of his HTN. EKG shows chronic inferolateral TWI. HsTrop in the 30s with flat trend. Echo this admission showed EF 50-55% (previously 40-45% 12/2020). G2DD, moderate concentric LVH, mild RV enlargement, moderate LAE, and no significant valvular abnormalities.  - Will plan for LHC to evaluate coronary anatomy given ongoing intermittent chest pain - Shared Decision Making/Informed Consent{ The risks [stroke (1 in 1000), death (1 in 1000), kidney failure [usually temporary] (1 in 500), bleeding (1 in 200), allergic reaction [possibly serious] (1 in 200)], benefits (diagnostic support and management of coronary artery disease) and alternatives of a cardiac catheterization were discussed in detail with Mr. Pettengill and he is willing to proceed. - Continue aspirin and statin - Continue Bblocker - Continue imdur - Continue prn SL nitro  2. Chronic combined CHF: Patient with history of ICM with EF 40-45% at the time of NSTEMI 12/2020. He had stenting to LAD and RCA  at that time but no follow-up echo to re-evaluate LV function as he was lost to follow-up. Echo this admission with improvement in EF to 50-55%, G2DD, no RWMA. Volume status is difficult to gauge with his morbid obesity. CXR showed  cardiomegaly with mild interstitial edema. He has 1+ pitting of LE on exam today. He was given IV lasix in the ED and continued on home po lasix 40mg  daily.  - Continue carvedilol and hydralazine - Continue spironolactone - Continue po lasix 40mg  daily - Continue to monitor strict I&Os and daily weights - Continue to monitor electrolytes and replete as needed to maintain K >4, Mg >2   3. HTN: BP elevated on admission. Improved with restarting home medications at lower doses. He reported being off medications for at least 1 week. BP improved and well controlled at this time - Continue amlodipine, carvedilol, clonidine, lasix, hydralazine (50 TID as opposed to 100mg  TID at home), and spironolactone.  - Home isordil not restarted    4. HLD: LDL 128 on lipids this admission. He reports being out of his atorvastatin for at least 1 week - Continue atorvastatin 80mg  daily - Low threshold to refer to lipid clinic if LDL remains >70 on next labs and compliance assured   5. Cocaine abuse: he reports using cocaine 3x in the past month, including once in the past week. Unclear how much this has contributed to #1. We discussed importance of cessation and recommendations to see psychiatry outpatient to develop healthier coping strategies to deal with his grief.  - Continue to encourage cessation   6. Tobacco abuse: still smoking - Continue to encourage cessation   7. GERD: stable - Continue pantoprazole   8. Joint pains: reports left shoulder and right hip pain. Has known arthritis and recommended for right hip replacement. He wants to get imaging of his left shoulder this admission though has not had any recent acute injuries - Will defer non-emergent imaging to PCP  outpatient - Continue gabapentin for neuropathy pains  9 Psychosocial issues: patient reports difficulty affording medications. CM reported $4 copays for medications. Looks like he was connected with social work outpatient following his NSTEMI in 2020 but was lost to follow-up.  - Will likely need SW involvement outpatient to assist with medication needs.   For questions or updates, please contact [facility] Please consult [email or web address] for contact info under        Signed, , PA-C  06/26/2021, 8:18 AM    Patient seen and examined.  Agree with above documentation.  On exam, patient is alert and oriented, regular rate and rhythm, no murmurs, lungs CTAB, trace LE edema.  Continues to have chest pain with stress.  Not a good candidate for coronary CTA or NST given his morbid obesity.  Will plan cardiac catheterization with radial access.    , MD

## 2021-06-26 NOTE — Progress Notes (Addendum)
Progress Note  Patient Name: Chris Powell Date of Encounter: 06/26/2021  [facility] Cardiologist: Kristeen Miss, MD   Subjective   Reported another episode of chest discomfort overnight which lasted 1 hour. He was under increased emotional stress as his girlfriend was not allowed to come up to his room when she arrived after visiting hours. Episode resolved without intervention. No complaints of palpitations or SOB.   Inpatient Medications    Scheduled Meds:  amLODipine  10 mg Oral Daily   aspirin EC  81 mg Oral Daily   atorvastatin  80 mg Oral q1800   carvedilol  25 mg Oral BID WC   cloNIDine  0.3 mg Oral BID   furosemide  40 mg Oral Daily   gabapentin  600 mg Oral TID   heparin injection (subcutaneous)  5,000 Units Subcutaneous Q8H   hydrALAZINE  50 mg Oral TID   insulin aspart  0-9 Units Subcutaneous TID WC   isosorbide mononitrate  30 mg Oral Daily   pantoprazole  40 mg Oral Daily   spironolactone  50 mg Oral Daily   Continuous Infusions:  PRN Meds: acetaminophen, albuterol, nitroGLYCERIN, ondansetron (ZOFRAN) IV   Vital Signs    Vitals:   06/25/21 1608 06/25/21 1647 06/25/21 1959 06/26/21 0624  BP: 113/64  (!) 115/49 140/67  Pulse:  73 79 85  Resp:   20   Temp:  98.2 F (36.8 C) 99 F (37.2 C) 98.3 F (36.8 C)  TempSrc:  Oral Oral Oral  SpO2:  97% 97%   Weight:    (!) 169.8 kg  Height:        Intake/Output Summary (Last 24 hours) at 06/26/2021 0818 Last data filed at 06/25/2021 2330 Gross per 24 hour  Intake --  Output 850 ml  Net -850 ml   Last 3 Weights 06/26/2021 06/25/2021 06/23/2021  Weight (lbs) 374 lb 5.5 oz (No Data) 340 lb  Weight (kg) 169.8 kg (No Data) 154.223 kg      Telemetry    Sinus rhythm with 4 beat run of NSVT, occasional PACs/PVCs - Personally Reviewed  ECG    No new tracings - Personally Reviewed  Physical Exam   GEN: Morbidly obese, sitting in bedside chair in NAD.   Neck: No JVD Cardiac: RRR, no murmurs, rubs,  or gallops.  Respiratory: Clear to auscultation bilaterally. GI: Soft, obese, nontender, non-distended  MS: trace LE edema; No deformity. Neuro:  Nonfocal  Psych: irritable   Labs    High Sensitivity Troponin:   Recent Labs  Lab 06/23/21 1900 06/23/21 2133  TROPONINIHS 37* 36*     Chemistry Recent Labs  Lab 06/23/21 1900 06/24/21 0422  NA 139 137  K 3.8 3.5  CL 104 103  CO2 25 25  GLUCOSE 119* 171*  BUN 17 15  CREATININE 1.15 1.14  CALCIUM 9.5 9.2  PROT 8.2*  --   ALBUMIN 4.0  --   AST 19  --   ALT 23  --   ALKPHOS 67  --   BILITOT 0.4  --   GFRNONAA >60 >60  ANIONGAP 10 9    Lipids  Recent Labs  Lab 06/24/21 0422  CHOL 188  TRIG 125  HDL 35*  LDLCALC 128*  CHOLHDL 5.4    Hematology Recent Labs  Lab 06/24/21 0422 06/25/21 0154 06/26/21 0155  WBC 8.9 10.2 8.0  RBC 4.83 4.73 4.49  HGB 14.9 14.6 13.9  HCT 44.8 44.6 42.9  MCV 92.8 94.3  95.5  MCH 30.8 30.9 31.0  MCHC 33.3 32.7 32.4  RDW 14.6 14.7 14.6  PLT 258 251 244   Thyroid No results for input(s): TSH, FREET4 in the last 168 hours.  BNP Recent Labs  Lab 06/23/21 1900 06/24/21 0422  BNP 185.0* 35.6    DDimer No results for input(s): DDIMER in the last 168 hours.   Radiology    ECHOCARDIOGRAM COMPLETE  Result Date: 06/24/2021    ECHOCARDIOGRAM REPORT   Patient Name:   Chris Powell Date of Exam: 06/24/2021 Medical Rec #:  [id number]        Height:       67.5 in Accession #:    [id number]       Weight:       340.0 lb Date of Birth:  1966/04/09         BSA:          2.548 m Patient Age:    55 years         BP:           133/80 mmHg Patient Gender: M                HR:           69 bpm. Exam Location:  Inpatient Procedure: 2D Echo, Cardiac Doppler, Color Doppler and Intracardiac            Opacification Agent Indications:    Chest Pain R07.9  History:        Patient has prior history of Echocardiogram examinations, most                 recent 01/06/2019. CAD, COPD; Risk Factors:Hypertension,                  Diabetes, Dyslipidemia and Morbid Obesity.  Sonographer:    Roosvelt Maser RDCS Referring Phys: [id number] DENNIS NARCISSE JR IMPRESSIONS  1. Poor acoustical windows even with definity. Left ventricular ejection fraction, by estimation, is 50 to 55%. The left ventricle has low normal function. The left ventricle has no regional wall motion abnormalities. There is moderate concentric left ventricular hypertrophy. Left ventricular diastolic parameters are consistent with Grade II diastolic dysfunction (pseudonormalization).  2. Right ventricular systolic function is normal. The right ventricular size is mildly enlarged. Tricuspid regurgitation signal is inadequate for assessing PA pressure.  3. Left atrial size was moderately dilated.  4. The mitral valve is normal in structure. No evidence of mitral valve regurgitation. No evidence of mitral stenosis.  5. The aortic valve is normal in structure. Aortic valve regurgitation is not visualized. No aortic stenosis is present.  6. The inferior vena cava is normal in size with greater than 50% respiratory variability, suggesting right atrial pressure of 3 mmHg. FINDINGS  Left Ventricle: Poor acoustical windows even with definity. Left ventricular ejection fraction, by estimation, is 50 to 55%. The left ventricle has low normal function. The left ventricle has no regional wall motion abnormalities. Definity contrast agent was given IV to delineate the left ventricular endocardial borders. The left ventricular internal cavity size was normal in size. There is moderate concentric left ventricular hypertrophy. Left ventricular diastolic parameters are consistent with Grade II diastolic dysfunction (pseudonormalization). Normal left ventricular filling pressure. Right Ventricle: The right ventricular size is mildly enlarged. No increase in right ventricular wall thickness. Right ventricular systolic function is normal. Tricuspid regurgitation signal is inadequate for  assessing PA pressure. Left Atrium: Left atrial size was moderately dilated. Right  Atrium: Right atrial size was normal in size. Pericardium: There is no evidence of pericardial effusion. Mitral Valve: The mitral valve is normal in structure. No evidence of mitral valve regurgitation. No evidence of mitral valve stenosis. Tricuspid Valve: The tricuspid valve is normal in structure. Tricuspid valve regurgitation is not demonstrated. No evidence of tricuspid stenosis. Aortic Valve: The aortic valve is normal in structure. Aortic valve regurgitation is not visualized. No aortic stenosis is present. Aortic valve mean gradient measures 4.0 mmHg. Aortic valve peak gradient measures 8.1 mmHg. Aortic valve area, by VTI measures 2.47 cm. Pulmonic Valve: The pulmonic valve was normal in structure. Pulmonic valve regurgitation is not visualized. No evidence of pulmonic stenosis. Aorta: The aortic root is normal in size and structure. Venous: The inferior vena cava is normal in size with greater than 50% respiratory variability, suggesting right atrial pressure of 3 mmHg. IAS/Shunts: No atrial level shunt detected by color flow Doppler.  LEFT VENTRICLE PLAX 2D LVIDd:         5.20 cm  Diastology LVIDs:         3.40 cm  LV e' medial:    4.90 cm/s LV PW:         1.50 cm  LV E/e' medial:  15.2 LV IVS:        1.40 cm  LV e' lateral:   7.18 cm/s LVOT diam:     2.30 cm  LV E/e' lateral: 10.4 LV SV:         69 LV SV Index:   27 LVOT Area:     4.15 cm  RIGHT VENTRICLE RV Basal diam:  4.30 cm RV Mid diam:    3.90 cm LEFT ATRIUM              Index       RIGHT ATRIUM           Index LA diam:        4.20 cm  1.65 cm/m  RA Area:     21.40 cm LA Vol (A2C):   119.0 ml 46.71 ml/m RA Volume:   68.80 ml  27.00 ml/m LA Vol (A4C):   113.0 ml 44.35 ml/m LA Biplane Vol: 115.0 ml 45.14 ml/m  AORTIC VALVE AV Area (Vmax):    2.45 cm AV Area (Vmean):   2.24 cm AV Area (VTI):     2.47 cm AV Vmax:           142.00 cm/s AV Vmean:          97.100  cm/s AV VTI:            0.277 m AV Peak Grad:      8.1 mmHg AV Mean Grad:      4.0 mmHg LVOT Vmax:         83.60 cm/s LVOT Vmean:        52.300 cm/s LVOT VTI:          0.165 m LVOT/AV VTI ratio: 0.60  AORTA Ao Root diam: 3.20 cm MITRAL VALVE MV Area (PHT): 3.16 cm    SHUNTS MV Decel Time: 240 msec    Systemic VTI:  0.16 m MV E velocity: 74.60 cm/s  Systemic Diam: 2.30 cm MV A velocity: 78.80 cm/s MV E/A ratio:  0.95 Armanda Magic MD Electronically signed by Armanda Magic MD Signature Date/Time: 06/24/2021/12:41:27 PM    Final     Cardiac Studies   Echocardiogram 06/24/21: 1. Poor acoustical windows even with definity. Left ventricular  ejection  fraction, by estimation, is 50 to 55%. The left ventricle has low normal  function. The left ventricle has no regional wall motion abnormalities.  There is moderate concentric left  ventricular hypertrophy. Left ventricular diastolic parameters are  consistent with Grade II diastolic dysfunction (pseudonormalization).   2. Right ventricular systolic function is normal. The right ventricular  size is mildly enlarged. Tricuspid regurgitation signal is inadequate for  assessing PA pressure.   3. Left atrial size was moderately dilated.   4. The mitral valve is normal in structure. No evidence of mitral valve  regurgitation. No evidence of mitral stenosis.   5. The aortic valve is normal in structure. Aortic valve regurgitation is  not visualized. No aortic stenosis is present.   6. The inferior vena cava is normal in size with greater than 50%  respiratory variability, suggesting right atrial pressure of 3 mmHg.    LHC 12/2020   Non-stenotic Prox LAD-1 lesion was previously treated. Prox LAD-2 lesion is 50% stenosed. 1st Mrg lesion is 80% stenosed. Prox RCA to Mid RCA lesion is 99% stenosed. Mid RCA lesion is 30% stenosed. Ost 1st Mrg lesion is 90% stenosed. Ost LAD to Prox LAD lesion is 90% stenosed. Mid LAD to Dist LAD lesion is 60% stenosed.   1.  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 Volume overload with moderate pulmonary venous HTN   Given morbid obesity and diffuse distal LAD disease not candidate for CABG. Films reviewed with Dr. Excell Seltzer. Plan PCI of RCA and LAD on Tuesday. Will diurese. Watch creatinine closely. Load Plavix.       Patient Profile     55 y.o. 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   Assessment & Plan    1. Unstable angina with known CAD s/p PCI/DES to LAD and RCA most recently in 2020: he presented with intermittent chest pain often occurring under emotional distress. He has continued to have episodes this admission despite appropriate medication management of his HTN. EKG shows chronic inferolateral TWI. HsTrop in the 30s with flat trend. Echo this admission showed EF 50-55% (previously 40-45% 12/2020). G2DD, moderate concentric LVH, mild RV enlargement, moderate LAE, and no significant valvular abnormalities.  - Will plan for LHC to evaluate coronary anatomy given ongoing intermittent chest pain - Shared Decision Making/Informed Consent{ The risks [stroke (1 in 1000), death (1 in 1000), kidney failure [usually temporary] (1 in 500), bleeding (1 in 200), allergic reaction [possibly serious] (1 in 200)], benefits (diagnostic support and management of coronary artery disease) and alternatives of a cardiac catheterization were discussed in detail with Mr. Pettengill and he is willing to proceed. - Continue aspirin and statin - Continue Bblocker - Continue imdur - Continue prn SL nitro  2. Chronic combined CHF: Patient with history of ICM with EF 40-45% at the time of NSTEMI 12/2020. He had stenting to LAD and RCA  at that time but no follow-up echo to re-evaluate LV function as he was lost to follow-up. Echo this admission with improvement in EF to 50-55%, G2DD, no RWMA. Volume status is difficult to gauge with his morbid obesity. CXR showed  cardiomegaly with mild interstitial edema. He has 1+ pitting of LE on exam today. He was given IV lasix in the ED and continued on home po lasix 40mg  daily.  - Continue carvedilol and hydralazine - Continue spironolactone - Continue po lasix 40mg  daily - Continue to monitor strict I&Os and daily weights - Continue to monitor electrolytes and replete as needed to maintain K >4, Mg >2   3. HTN: BP elevated on admission. Improved with restarting home medications at lower doses. He reported being off medications for at least 1 week. BP improved and well controlled at this time - Continue amlodipine, carvedilol, clonidine, lasix, hydralazine (50 TID as opposed to 100mg  TID at home), and spironolactone.  - Home isordil not restarted    4. HLD: LDL 128 on lipids this admission. He reports being out of his atorvastatin for at least 1 week - Continue atorvastatin 80mg  daily - Low threshold to refer to lipid clinic if LDL remains >70 on next labs and compliance assured   5. Cocaine abuse: he reports using cocaine 3x in the past month, including once in the past week. Unclear how much this has contributed to #1. We discussed importance of cessation and recommendations to see psychiatry outpatient to develop healthier coping strategies to deal with his grief.  - Continue to encourage cessation   6. Tobacco abuse: still smoking - Continue to encourage cessation   7. GERD: stable - Continue pantoprazole   8. Joint pains: reports left shoulder and right hip pain. Has known arthritis and recommended for right hip replacement. He wants to get imaging of his left shoulder this admission though has not had any recent acute injuries - Will defer non-emergent imaging to PCP  outpatient - Continue gabapentin for neuropathy pains  9 Psychosocial issues: patient reports difficulty affording medications. CM reported $4 copays for medications. Looks like he was connected with social work outpatient following his NSTEMI in 2020 but was lost to follow-up.  - Will likely need SW involvement outpatient to assist with medication needs.   For questions or updates, please contact [facility] Please consult [email or web address] for contact info under        Signed, , PA-C  06/26/2021, 8:18 AM    Patient seen and examined.  Agree with above documentation.  On exam, patient is alert and oriented, regular rate and rhythm, no murmurs, lungs CTAB, trace LE edema.  Continues to have chest pain with stress.  Not a good candidate for coronary CTA or NST given his morbid obesity.  Will plan cardiac catheterization with radial access.    , MD

## 2021-06-26 NOTE — Progress Notes (Signed)
Site area: right groin a 6 french arterial sheath was removed by Rosilyn Mings RN  Site Prior to Removal:  Level 0  Pressure Applied For 30 MINUTES    Bedrest Beginning at  2100pm  X4 hours  Manual:   Yes.    Patient Status During Pull:  stable  Post Pull Groin Site:  Level 0  Post Pull Instructions Given:  Yes.    Post Pull Pulses Present:  Yes.    Dressing Applied:  Yes.    Comments:  2+Right DP

## 2021-06-26 NOTE — Progress Notes (Signed)
0900  Pt had c/o chest pain at 8/10 on pain scale. Pt was given Ntg x2 dose sublingual with complete pain relief. EKG was also done  and given to [employer]  at the desk. Pt to go for cardiac cath today.

## 2021-06-27 ENCOUNTER — Encounter (HOSPITAL_COMMUNITY): Payer: Self-pay | Admitting: Cardiology

## 2021-06-27 ENCOUNTER — Telehealth: Payer: Self-pay

## 2021-06-27 DIAGNOSIS — I5043 Acute on chronic combined systolic (congestive) and diastolic (congestive) heart failure: Secondary | ICD-10-CM | POA: Diagnosis not present

## 2021-06-27 DIAGNOSIS — I200 Unstable angina: Secondary | ICD-10-CM

## 2021-06-27 LAB — BASIC METABOLIC PANEL
Anion gap: 8 (ref 5–15)
BUN: 15 mg/dL (ref 6–20)
CO2: 27 mmol/L (ref 22–32)
Calcium: 9.2 mg/dL (ref 8.9–10.3)
Chloride: 98 mmol/L (ref 98–111)
Creatinine, Ser: 1.22 mg/dL (ref 0.61–1.24)
GFR, Estimated: >60 mL/min (ref >60)
Glucose, Bld: 212 mg/dL — ABNORMAL HIGH (ref 70–99)
Potassium: 3.8 mmol/L (ref 3.5–5.1)
Sodium: 133 mmol/L — ABNORMAL LOW (ref 135–145)

## 2021-06-27 LAB — GLUCOSE, CAPILLARY
Glucose-Capillary: 158 mg/dL — ABNORMAL HIGH (ref 70–99)
Glucose-Capillary: 218 mg/dL — ABNORMAL HIGH (ref 70–99)
Glucose-Capillary: 180 mg/dL — ABNORMAL HIGH (ref 70–99)
Glucose-Capillary: 202 mg/dL — ABNORMAL HIGH (ref 70–99)

## 2021-06-27 LAB — CBC
HCT: 46.6 % (ref 39.0–52.0)
Hemoglobin: 15.5 g/dL (ref 13.0–17.0)
MCH: 30.7 pg (ref 26.0–34.0)
MCHC: 33.3 g/dL (ref 30.0–36.0)
MCV: 92.3 fL (ref 80.0–100.0)
Platelets: 257 10*3/uL (ref 150–400)
RBC: 5.05 MIL/uL (ref 4.22–5.81)
RDW: 14.6 % (ref 11.5–15.5)
WBC: 8.9 10*3/uL (ref 4.0–10.5)
nRBC: 0 % (ref 0.0–0.2)

## 2021-06-27 LAB — POCT ACTIVATED CLOTTING TIME
Activated Clotting Time: 277 seconds
Activated Clotting Time: 434 seconds

## 2021-06-27 MED ORDER — MUPIROCIN 2 % EX OINT
1.0000 "application " | TOPICAL_OINTMENT | Freq: Two times a day (BID) | CUTANEOUS | Status: DC
  Administered 2021-06-27 – 2021-06-29 (×5): 1 via NASAL
  Filled 2021-06-27: qty 22

## 2021-06-27 MED ORDER — HYDRALAZINE HCL 50 MG PO TABS
100.0000 mg | ORAL_TABLET | Freq: Three times a day (TID) | ORAL | Status: DC
  Administered 2021-06-27 – 2021-06-29 (×6): 100 mg via ORAL
  Filled 2021-06-27 (×6): qty 2

## 2021-06-27 MED ORDER — CHLORHEXIDINE GLUCONATE CLOTH 2 % EX PADS
6.0000 | MEDICATED_PAD | Freq: Every day | CUTANEOUS | Status: DC
  Administered 2021-06-27 – 2021-06-29 (×3): 6 via TOPICAL

## 2021-06-27 MED ORDER — FUROSEMIDE 10 MG/ML IJ SOLN
40.0000 mg | Freq: Two times a day (BID) | INTRAMUSCULAR | Status: DC
  Administered 2021-06-27 (×2): 40 mg via INTRAVENOUS
  Filled 2021-06-27 (×2): qty 4

## 2021-06-27 MED ORDER — ALUM & MAG HYDROXIDE-SIMETH 200-200-20 MG/5ML PO SUSP
30.0000 mL | Freq: Four times a day (QID) | ORAL | Status: DC | PRN
  Administered 2021-06-27: 30 mL via ORAL
  Filled 2021-06-27: qty 30

## 2021-06-27 MED ORDER — FAMOTIDINE 20 MG PO TABS
20.0000 mg | ORAL_TABLET | Freq: Once | ORAL | Status: AC
  Administered 2021-06-27: 20 mg via ORAL
  Filled 2021-06-27: qty 1

## 2021-06-27 NOTE — Discharge Instructions (Addendum)
PLEASE REMEMBER TO BRING ALL OF YOUR MEDICATIONS TO EACH OF YOUR FOLLOW-UP OFFICE VISITS.  PLEASE ATTEND ALL SCHEDULED FOLLOW-UP APPOINTMENTS.   Activity: Increase activity slowly as tolerated. You may shower, but no soaking baths (or swimming) for 1 week. No driving for 24 hours. No lifting over 5 lbs for 1 week. No sexual activity for 1 week.   You May Return to Work: in 1 week (if applicable)  Wound Care: You may wash cath site gently with soap and water. Keep cath site clean and dry. If you notice pain, swelling, bleeding or pus at your cath site, please call [phone].  PLEASE DO NOT MISS ANY DOSES OF YOUR ASPIRIN AND BRILINTA!!!!! Also keep a log of you blood pressures and bring back to your follow up appt. Please call the office with any questions.   Patients taking blood thinners should generally stay away from medicines like ibuprofen, Advil, Motrin, naproxen, and Aleve due to risk of stomach bleeding. You may take Tylenol as directed or talk to your primary doctor about alternatives.  PLEASE ENSURE THAT YOU DO NOT RUN OUT OF YOUR BRILINTA. This medication is very important to remain on for at least one year. IF you have issues obtaining this medication due to cost please CALL the office 3-5 business days prior to running out in order to prevent missing doses of this medication.   Heart Failure Education: Weigh yourself EVERY morning after you go to the bathroom but before you eat or drink anything. Write this number down in a weight log/diary. If you gain 3 pounds overnight or 5 pounds in a week, call the office. Take your medicines as prescribed. If you have concerns about your medications, please call us before you stop taking them.  Eat low salt foods--Limit salt (sodium) to 2000 mg per day. This will help prevent your body from holding onto fluid. Read food labels as many processed foods have a lot of sodium, especially canned goods and prepackaged meats. If you would like some  assistance choosing low sodium foods, we would be happy to set you up with a nutritionist. Stay as active as you can everyday. Staying active will give you more energy and make your muscles stronger. Start with 5 minutes at a time and work your way up to 30 minutes a day. Break up your activities--do some in the morning and some in the afternoon. Start with 3 days per week and work your way up to 5 days as you can.  If you have chest pain, feel short of breath, dizzy, or lightheaded, STOP. If you don't feel better after a short rest, call 911. If you do feel better, call the office to let us know you have symptoms with exercise. Limit all fluids for the day to less than 2 liters. Fluid includes all drinks, coffee, juice, ice chips, soup, jello, and all other liquids.

## 2021-06-27 NOTE — Telephone Encounter (Signed)
**Note De-identified Erandi Lemma Obfuscation** -----  **Note De-Identified Kieryn Burtis Obfuscation** Message from Beatriz Stallion, PA-C sent at 06/27/2021 10:28 AM EDT ----- Regarding: TOC call Hey there! Please place [facility] call for upcoming hospital follow-up with Dr. Elease Hashimoto 07/03/21. Thanks!

## 2021-06-27 NOTE — Progress Notes (Signed)
CARDIAC REHAB PHASE I   PRE:  Rate/Rhythm: 73 SR  BP:  Sitting: 120/72      SaO2: 97 RA  MODE:  Ambulation: 440 ft   POST:  Rate/Rhythm: 86 SR  BP:  Sitting: 148/78      SaO2: 98 RA  Pt tolerated exercise well and amb 440 ft with contact guard assist. He held onto railing at times while walking down hall. Pt took one standing rest break to put on mask. He was motivated to make it to the end of the hall. Pt denies CP, SOB, or dizziness throughout walk. Educated pt on risk factors, NTG and angina, restrictions, nutrition, and exercise. Encouraged smoking cessation and mentioned diabetic diet. Pt has had stents in the past so he has heard ed before. Stressed importance of ASA and brilinta. Pt was up in spirits to see me and get out of his room. Will continue to follow.  [id number] Joya San, MS, ACSM-CEP 06/27/2021 10:31 AM

## 2021-06-27 NOTE — Telephone Encounter (Signed)
**Note De-Identified Chris Powell Obfuscation** The pt is currently in hospital. We will monitor his chart and will call once he has been discharged.

## 2021-06-27 NOTE — Progress Notes (Addendum)
Progress Note  Patient Name: Chris Powell Date of Encounter: 06/27/2021  [facility] Cardiologist: Kristeen Miss, MD   Subjective   No recurrent chest pain overnight. Had some SOB during my evaluation when he became agitated. No palpitations. We discussed importance of medication compliance going forward. He again expresses concern for ability to afford medication cost. We discussed quitting smoking as a way to recoup funds. He reports that's the only thing that calms him down sometimes. He is struggling with grief and I recommended outpatient psych eval for improved coping strategies though he was resistant to this. He attributes his inactivity to joint pains and reports needing to see his PCP to obtain pain medications to improve his ability to work. I explained that narcotics would be a bandaid on the situation and that a lot of his issues stems from his obesity. Offered a referral to the healthy weight and wellness center though he was not interested in this.    Inpatient Medications    Scheduled Meds:  amLODipine  10 mg Oral Daily   aspirin EC  81 mg Oral Daily   atorvastatin  80 mg Oral q1800   carvedilol  25 mg Oral BID WC   cloNIDine  0.3 mg Oral BID   furosemide  40 mg Oral Daily   gabapentin  600 mg Oral TID   heparin injection (subcutaneous)  5,000 Units Subcutaneous Q8H   hydrALAZINE  50 mg Oral TID   insulin aspart  0-9 Units Subcutaneous TID WC   isosorbide mononitrate  30 mg Oral Daily   pantoprazole  40 mg Oral Daily   sodium chloride flush  3 mL Intravenous Q12H   sodium chloride flush  3 mL Intravenous Q12H   spironolactone  50 mg Oral Daily   ticagrelor  90 mg Oral BID   Continuous Infusions:  sodium chloride     PRN Meds: sodium chloride, acetaminophen, albuterol, alum & mag hydroxide-simeth, lip balm, nitroGLYCERIN, ondansetron (ZOFRAN) IV, sodium chloride, sodium chloride flush   Vital Signs    Vitals:   06/26/21 2110 06/26/21 2123 06/26/21 2138  06/27/21 0412  BP: (!) 134/96 132/89 (!) 148/91 (!) 147/73  Pulse:  71 73 77  Resp:  20  20  Temp:  98.7 F (37.1 C)  98 F (36.7 C)  TempSrc:  Oral  Oral  SpO2:  100% 100% 96%  Weight:    (!) 166.2 kg  Height:        Intake/Output Summary (Last 24 hours) at 06/27/2021 0744 Last data filed at 06/26/2021 2359 Gross per 24 hour  Intake 1112.5 ml  Output 200 ml  Net 912.5 ml   Last 3 Weights 06/27/2021 06/26/2021 06/25/2021  Weight (lbs) 366 lb 6.4 oz 374 lb 5.5 oz (No Data)  Weight (kg) 166.198 kg 169.8 kg (No Data)      Telemetry    Sinus rhythm - Personally Reviewed  ECG    Sinus rhythm, rate 74 bpm, chronic TWI in lateral leads, inferior T wave abnormalities improved, PAC, no STE/D - Personally Reviewed  Physical Exam   GEN: No acute distress.   Neck: No JVD Cardiac: RRR, no murmurs, rubs, or gallops.  Respiratory: Clear to auscultation bilaterally. GI: Soft, obese, nontender, non-distended  MS: No edema; No deformity. Neuro:  Nonfocal  Psych: Normal affect   Labs    High Sensitivity Troponin:   Recent Labs  Lab 06/23/21 1900 06/23/21 2133  TROPONINIHS 37* 36*     Chemistry Recent  Labs  Lab 06/23/21 1900 06/24/21 0422 06/27/21 0144  NA 139 137 133*  K 3.8 3.5 3.8  CL 104 103 98  CO2 25 25 27   GLUCOSE 119* 171* 212*  BUN 17 15 15   CREATININE 1.15 1.14 1.22  CALCIUM 9.5 9.2 9.2  PROT 8.2*  --   --   ALBUMIN 4.0  --   --   AST 19  --   --   ALT 23  --   --   ALKPHOS 67  --   --   BILITOT 0.4  --   --   GFRNONAA >60 >60 >60  ANIONGAP 10 9 8     Lipids  Recent Labs  Lab 06/24/21 0422  CHOL 188  TRIG 125  HDL 35*  LDLCALC 128*  CHOLHDL 5.4    Hematology Recent Labs  Lab 06/25/21 0154 06/26/21 0155 06/27/21 0144  WBC 10.2 8.0 8.9  RBC 4.73 4.49 5.05  HGB 14.6 13.9 15.5  HCT 44.6 42.9 46.6  MCV 94.3 95.5 92.3  MCH 30.9 31.0 30.7  MCHC 32.7 32.4 33.3  RDW 14.7 14.6 14.6  PLT 251 244 257   Thyroid No results for input(s): TSH,  FREET4 in the last 168 hours.  BNP Recent Labs  Lab 06/23/21 1900 06/24/21 0422  BNP 185.0* 35.6    DDimer No results for input(s): DDIMER in the last 168 hours.   Radiology    CARDIAC CATHETERIZATION  Result Date: 06/26/2021   CULPRIT LESION: Ramus-1 lesion is 90% stenosed.  Ramus-2 lesion is 70% stenosed.   A drug-eluting stent was successfully placed, covering both lesions, using a STENT ONYX FRONTIER 2.5X34.  Postdilated tapered fashion from 3.1 to 2.8 mm   Post intervention, there is a 0% residual stenosis for both lesions.   Prox LAD to Mid LAD lesion is 25% stenosed -> just prior to stented segment   Mid LAD to Dist LAD overlapping stents is 25% stenosed.  (In-stent restenosis   Previously placed Prox RCA stent (drug-eluting) is  widely patent.   --------------------------------------------------------------   There is no aortic valve stenosis.   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 Successful DES PCI of the RI lesions with 1 single stent (Onyx frontier DES 2.5 mm at 34 mm postdilated from 3.1 to 2.8 mm) Severely elevated LVEDP with systemic hypertension Morbidly obese RECOMMENDATIONS Femoral sheath removal based on ACT and PACU holding area. With elevated LVEDP, I have ordered 40 mg IV Lasix to be given in PACU.  Would consider the possibility of oral loop diuretic on discharge Continue Aggressive Cardiac Risk Factor Medication/Guideline Directed Medical Therapy Needs to lose weight 08/23/21, MD   Cardiac Studies   Echocardiogram 06/24/21: 1. Poor acoustical windows even with definity. Left ventricular ejection  fraction, by  estimation, is 50 to 55%. The left ventricle has low normal  function. The left ventricle has no regional wall motion abnormalities.  There is moderate concentric left  ventricular hypertrophy. Left ventricular diastolic parameters are  consistent with Grade II diastolic dysfunction (pseudonormalization).   2. Right ventricular systolic function is normal. The right ventricular  size is mildly enlarged. Tricuspid regurgitation signal is inadequate for  assessing PA  pressure.   3. Left atrial size was moderately dilated.   4. The mitral valve is normal in structure. No evidence of mitral valve  regurgitation. No evidence of mitral stenosis.   5. The aortic valve is normal in structure. Aortic valve regurgitation is  not visualized. No aortic stenosis is present.   6. The inferior vena cava is normal in size with greater than 50%  respiratory variability, suggesting right atrial pressure of 3 mmHg.  Left heart catheterization 06/27/21:   CULPRIT LESION: Ramus-1 lesion is 90% stenosed.  Ramus-2 lesion is 70% stenosed.   A drug-eluting stent was successfully placed, covering both lesions, using a STENT ONYX FRONTIER 2.5X34.  Postdilated tapered fashion from 3.1 to 2.8 mm   Post intervention, there is a 0% residual stenosis for both lesions.   Prox LAD to Mid LAD lesion is 25% stenosed -> just prior to stented segment   Mid LAD to Dist LAD overlapping stents is 25% stenosed.  (In-stent restenosis   Previously placed Prox RCA stent (drug-eluting) is  widely patent.   --------------------------------------------------------------   There is no aortic valve stenosis.   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 Successful DES PCI of the RI lesions with 1 single stent (Onyx frontier DES 2.5 mm at 34 mm postdilated from 3.1 to 2.8 mm) Severely elevated LVEDP with systemic hypertension Morbidly obese     RECOMMENDATIONS Femoral sheath removal based on ACT and PACU holding area. With elevated LVEDP, I have ordered 40 mg IV Lasix to be given in PACU.  Would consider the possibility of oral loop diuretic on discharge Continue Aggressive Cardiac Risk Factor Medication/Guideline Directed Medical Therapy Needs to lose weight  Diagnostic Dominance: Right Intervention   Patient Profile       55 y.o. 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   Assessment & Plan    1. Unstable angina in patient with CAD s/p PCI/DES to LAD and RCA most recently in 2020: he presented with intermittent chest pain, often occurring under emotional stress. He has had medication non-compliance. EKG shows chronic inferolateral TWI. HsTrop in the 30s with flat trend. Echo this admission showed EF 50-55% (previously 40-45% 12/2020). G2DD, moderate concentric LVH, mild RV enlargement, moderate LAE, and no significant valvular abnormalities. Given recurrent chest pain he went for [facility] 06/26/21 which showed patent RCA and LAD stents with 25% ISR of LAD, 25% pLAD stenosis prior to stent, and 90% pRI followed by 70% mRI stenosis managed with PCI/DES. LVEDP was noted to be severely elevated with systemic HTN. He was recommended for DAPT x1 year and was started on aspirin and brilinta   - Continue aspirin and brilinta - will have pharmacy verify cost - Continue atorvastatin - Continue Bblocker  2. Acute on chronic combined CHF: Patient with history of ICM with EF 40-45% at the time  of NSTEMI 12/2020. He had stenting to LAD and RCA at that time but  no follow-up echo to re-evaluate LV function as he was lost to follow-up. Echo this admission with improvement in EF to 50-55%, G2DD, no RWMA. Volume status is difficult to gauge with his morbid obesity. CXR showed cardiomegaly with mild interstitial edema. He has 1+ pitting of LE on exam today. He was given IV lasix in the ED and continued on home po lasix 40mg  daily. LVEDP was severely elevated on cath yesterday and he was given additional IV lasix 40mg  though I&Os appear inaccurate. Weight 374>366lbs. Volume status is difficult to gauge on exam given morbid obesity. He did get some SOB when agitated.  - Will diurese with IV lasix 40mg  BID today and monitor for response - Continue carvedilol and hydralazine - Continue spironolactone - Continue to monitor strict I&Os and daily weights - Continue to monitor electrolytes and replete as needed to maintain K >4, Mg >2  3. HTN: BP elevated on admission. Continues to be elevated over the past 24 hours.  - Will increase hydralazine to 100mg  TID - Continue amlodipine, carvedilol, clonidine, lasix, and spironolactone.    4. HLD: LDL 128 on lipids this admission. He reports being out of his atorvastatin for at least 1 week - Continue atorvastatin 80mg  daily - Low threshold to refer to lipid clinic if LDL remains >70 on next labs and compliance assured   5. Cocaine abuse: he reports using cocaine 3x in the past month, including once in the past week. Unclear how much this has contributed to #1. We discussed importance of cessation and recommendations to see psychiatry outpatient to develop healthier coping strategies to deal with his grief.  - Continue to encourage cessation   6. Tobacco abuse: still smoking. We had a long discussion on quitting which would improve his financial situation. He reports smoking is the only thing that calms him down at times. Doubtful he will quit - Continue to  encourage cessation   7. GERD: stable - Continue pantoprazole   8. Joint pains: reports left shoulder and right hip pain. Has known arthritis and recommended for right hip replacement. He wants to get imaging of his left shoulder this admission though has not had any recent acute injuries. Suspect largely attributed to his weight. He is not interested in a referral to the healthy weight and wellness.  - Will defer non-emergent imaging to PCP outpatient - Continue gabapentin for neuropathy pains   9 Psychosocial issues: patient reports difficulty affording medications. CM reported $4 copays for medications. Looks like he was connected with social work outpatient following his NSTEMI in 2020 but was lost to follow-up.  - Will likely need SW involvement outpatient to assist with medication needs.   For questions or updates, please contact [facility] Please consult [email or web address] for contact info under      Signed, , PA-C  06/27/2021, 7:44 AM     Patient seen and examined.  Agree with above documentation.  On exam, patient is alert and oriented, regular rate and rhythm, no murmurs, diminished breath sounds, 1+ LE edema, difficult to assess JVD given body habitus.  Cath yesterday showed severe stenosis in the ramus status post PCI.  Also noted to have elevated LVEDP.  Starting IV Lasix 40 mg BID  , MD

## 2021-06-28 ENCOUNTER — Other Ambulatory Visit (HOSPITAL_COMMUNITY): Payer: Self-pay

## 2021-06-28 ENCOUNTER — Inpatient Hospital Stay (HOSPITAL_COMMUNITY): Payer: Medicaid Other

## 2021-06-28 DIAGNOSIS — M25512 Pain in left shoulder: Secondary | ICD-10-CM | POA: Diagnosis not present

## 2021-06-28 DIAGNOSIS — I5043 Acute on chronic combined systolic (congestive) and diastolic (congestive) heart failure: Secondary | ICD-10-CM | POA: Diagnosis not present

## 2021-06-28 DIAGNOSIS — I200 Unstable angina: Secondary | ICD-10-CM | POA: Diagnosis not present

## 2021-06-28 LAB — CBC
HCT: 44.3 % (ref 39.0–52.0)
Hemoglobin: 14.4 g/dL (ref 13.0–17.0)
MCH: 30.3 pg (ref 26.0–34.0)
MCHC: 32.5 g/dL (ref 30.0–36.0)
MCV: 93.3 fL (ref 80.0–100.0)
Platelets: 255 10*3/uL (ref 150–400)
RBC: 4.75 MIL/uL (ref 4.22–5.81)
RDW: 14.5 % (ref 11.5–15.5)
WBC: 8.9 10*3/uL (ref 4.0–10.5)
nRBC: 0 % (ref 0.0–0.2)

## 2021-06-28 LAB — GLUCOSE, CAPILLARY
Glucose-Capillary: 267 mg/dL — ABNORMAL HIGH (ref 70–99)
Glucose-Capillary: 144 mg/dL — ABNORMAL HIGH (ref 70–99)
Glucose-Capillary: 133 mg/dL — ABNORMAL HIGH (ref 70–99)
Glucose-Capillary: 232 mg/dL — ABNORMAL HIGH (ref 70–99)

## 2021-06-28 LAB — BASIC METABOLIC PANEL
Anion gap: 9 (ref 5–15)
BUN: 23 mg/dL — ABNORMAL HIGH (ref 6–20)
CO2: 30 mmol/L (ref 22–32)
Calcium: 10.2 mg/dL (ref 8.9–10.3)
Chloride: 96 mmol/L — ABNORMAL LOW (ref 98–111)
Creatinine, Ser: 1.53 mg/dL — ABNORMAL HIGH (ref 0.61–1.24)
GFR, Estimated: 53 mL/min — ABNORMAL LOW (ref >60)
Glucose, Bld: 169 mg/dL — ABNORMAL HIGH (ref 70–99)
Potassium: 3.9 mmol/L (ref 3.5–5.1)
Sodium: 135 mmol/L (ref 135–145)

## 2021-06-28 LAB — MAGNESIUM: Magnesium: 2.3 mg/dL (ref 1.7–2.4)

## 2021-06-28 MED ORDER — ISOSORBIDE MONONITRATE ER 60 MG PO TB24
60.0000 mg | ORAL_TABLET | Freq: Every day | ORAL | Status: DC
  Administered 2021-06-29: 60 mg via ORAL
  Filled 2021-06-28: qty 1

## 2021-06-28 MED ORDER — LIDOCAINE 5 % EX PTCH
1.0000 | MEDICATED_PATCH | CUTANEOUS | Status: DC
  Administered 2021-06-28 – 2021-06-29 (×2): 1 via TRANSDERMAL
  Filled 2021-06-28 (×2): qty 1

## 2021-06-28 MED ORDER — FUROSEMIDE 40 MG PO TABS
40.0000 mg | ORAL_TABLET | Freq: Every day | ORAL | Status: DC
  Administered 2021-06-28 – 2021-06-29 (×2): 40 mg via ORAL
  Filled 2021-06-28 (×2): qty 1

## 2021-06-28 NOTE — Progress Notes (Addendum)
Progress Note  Patient Name: Chris Powell Date of Encounter: 06/28/2021  [facility] Cardiologist: Kristeen Miss, MD   Subjective   Sister and brother died 3 mo ago, he has done nothing but eat since then.  Thinks he tore his rotator cuff about a week before admit, working to get a nut loose.  Has not seen MD for this. Says it is painful  Inpatient Medications    Scheduled Meds:  amLODipine  10 mg Oral Daily   aspirin EC  81 mg Oral Daily   atorvastatin  80 mg Oral q1800   carvedilol  25 mg Oral BID WC   Chlorhexidine Gluconate Cloth  6 each Topical Q0600   cloNIDine  0.3 mg Oral BID   furosemide  40 mg Oral Daily   gabapentin  600 mg Oral TID   heparin injection (subcutaneous)  5,000 Units Subcutaneous Q8H   hydrALAZINE  100 mg Oral TID   insulin aspart  0-9 Units Subcutaneous TID WC   isosorbide mononitrate  30 mg Oral Daily   mupirocin ointment  1 application Nasal BID   pantoprazole  40 mg Oral Daily   sodium chloride flush  3 mL Intravenous Q12H   sodium chloride flush  3 mL Intravenous Q12H   spironolactone  50 mg Oral Daily   ticagrelor  90 mg Oral BID   Continuous Infusions:  sodium chloride     PRN Meds: sodium chloride, acetaminophen, albuterol, alum & mag hydroxide-simeth, lip balm, nitroGLYCERIN, ondansetron (ZOFRAN) IV, sodium chloride, sodium chloride flush   Vital Signs    Vitals:   06/27/21 1314 06/27/21 1802 06/27/21 2012 06/28/21 0456  BP: (!) 124/92 136/82 131/78 (!) 150/87  Pulse: 75  74 71  Resp: 18  16 16   Temp: 98.3 F (36.8 C)  98.5 F (36.9 C) 98.5 F (36.9 C)  TempSrc: Oral  Oral Oral  SpO2:   97% 98%  Weight:    (!) 166 kg  Height:        Intake/Output Summary (Last 24 hours) at 06/28/2021 0756 Last data filed at 06/27/2021 2215 Gross per 24 hour  Intake 480 ml  Output 1000 ml  Net -520 ml   Last 3 Weights 06/28/2021 06/27/2021 06/26/2021  Weight (lbs) 365 lb 15.4 oz 366 lb 6.4 oz 374 lb 5.5 oz  Weight (kg) 166 kg  166.198 kg 169.8 kg      Telemetry     SR, SB, Junctional, w/ vent escape beats - Personally Reviewed  ECG    10/05:  Sinus rhythm, rate 74 bpm, chronic TWI in lateral leads, inferior T wave abnormalities improved, PAC, no STE/D - Personally Reviewed  Physical Exam   GEN: No acute distress.   Neck: No JVD seen Cardiac: RRR, no murmur, no rubs, or gallops.  Respiratory: diminished to auscultation bilaterally in the bases. GI: Soft, nontender, non-distended  MS: No edema; No deformity. Has problems raising L arm and cannot pull it back Neuro:  Nonfocal  Psych: Normal affect   Labs    High Sensitivity Troponin:   Recent Labs  Lab 06/23/21 1900 06/23/21 2133  TROPONINIHS 37* 36*     Chemistry Recent Labs  Lab 06/23/21 1900 06/24/21 0422 06/27/21 0144 06/28/21 0150  NA 139 137 133* 135  K 3.8 3.5 3.8 3.9  CL 104 103 98 96*  CO2 25 25 27 30   GLUCOSE 119* 171* 212* 169*  BUN 17 15 15  23*  CREATININE 1.15 1.14 1.22 1.53*  CALCIUM 9.5 9.2 9.2 10.2  MG  --   --   --  2.3  PROT 8.2*  --   --   --   ALBUMIN 4.0  --   --   --   AST 19  --   --   --   ALT 23  --   --   --   ALKPHOS 67  --   --   --   BILITOT 0.4  --   --   --   GFRNONAA >60 >60 >60 53*  ANIONGAP 10 9 8 9     Lipids  Recent Labs  Lab 06/24/21 0422  CHOL 188  TRIG 125  HDL 35*  LDLCALC 128*  CHOLHDL 5.4    Hematology Recent Labs  Lab 06/26/21 0155 06/27/21 0144 06/28/21 0150  WBC 8.0 8.9 8.9  RBC 4.49 5.05 4.75  HGB 13.9 15.5 14.4  HCT 42.9 46.6 44.3  MCV 95.5 92.3 93.3  MCH 31.0 30.7 30.3  MCHC 32.4 33.3 32.5  RDW 14.6 14.6 14.5  PLT 244 257 255   Thyroid No results for input(s): TSH, FREET4 in the last 168 hours.  BNP Recent Labs  Lab 06/23/21 1900 06/24/21 0422  BNP 185.0* 35.6   Drugs of Abuse     Component Value Date/Time   LABOPIA NONE DETECTED 01/06/2019 0510   COCAINSCRNUR POSITIVE (A) 01/06/2019 0510   LABBENZ POSITIVE (A) 01/06/2019 0510   AMPHETMU NONE DETECTED  01/06/2019 0510   THCU NONE DETECTED 01/06/2019 0510   LABBARB NONE DETECTED 01/06/2019 0510     Radiology    CARDIAC CATHETERIZATION  Result Date: 06/26/2021   CULPRIT LESION: Ramus-1 lesion is 90% stenosed.  Ramus-2 lesion is 70% stenosed.   A drug-eluting stent was successfully placed, covering both lesions, using a STENT ONYX FRONTIER 2.5X34.  Postdilated tapered fashion from 3.1 to 2.8 mm   Post intervention, there is a 0% residual stenosis for both lesions.   Prox LAD to Mid LAD lesion is 25% stenosed -> just prior to stented segment   Mid LAD to Dist LAD overlapping stents is 25% stenosed.  (In-stent restenosis   Previously placed Prox RCA stent (drug-eluting) is  widely patent.   --------------------------------------------------------------   There is no aortic valve stenosis.   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 Successful DES PCI of the RI lesions with 1 single stent (Onyx frontier DES 2.5 mm at 34 mm postdilated from 3.1 to 2.8 mm) Severely elevated LVEDP with systemic hypertension Morbidly obese RECOMMENDATIONS Femoral sheath removal based on ACT and PACU holding area. With elevated LVEDP, I have ordered 40 mg IV Lasix to be given in PACU.  Would consider the possibility of oral loop diuretic on discharge Continue Aggressive Cardiac Risk Factor Medication/Guideline Directed Medical Therapy Needs to lose weight 08/26/2021, MD   Cardiac Studies   Echocardiogram 06/24/21: 1. Poor acoustical windows even with definity. Left ventricular ejection  fraction, by estimation, is 50 to 55%. The left ventricle has low normal  function. The  left ventricle has no regional wall motion abnormalities.  There is moderate concentric left  ventricular hypertrophy. Left ventricular diastolic parameters are  consistent with Grade  II diastolic dysfunction (pseudonormalization).   2. Right ventricular systolic function is normal. The right ventricular  size is mildly enlarged. Tricuspid regurgitation signal is inadequate for  assessing PA pressure.   3. Left atrial size was moderately dilated.   4. The mitral valve is normal in structure. No evidence of mitral valve  regurgitation. No evidence of mitral stenosis.   5. The aortic valve is normal in structure. Aortic valve regurgitation is  not visualized. No aortic stenosis is present.   6. The inferior vena cava is normal in size with greater than 50%  respiratory variability, suggesting right atrial pressure of 3 mmHg.  Left heart catheterization 06/27/21:   CULPRIT LESION: Ramus-1 lesion is 90% stenosed.  Ramus-2 lesion is 70% stenosed.   A drug-eluting stent was successfully placed, covering both lesions, using a STENT ONYX FRONTIER 2.5X34.  Postdilated tapered fashion from 3.1 to 2.8 mm   Post intervention, there is a 0% residual stenosis for both lesions.   Prox LAD to Mid LAD lesion is 25% stenosed -> just prior to stented segment   Mid LAD to Dist LAD overlapping stents is 25% stenosed.  (In-stent restenosis   Previously placed Prox RCA stent (drug-eluting) is  widely patent.   --------------------------------------------------------------   There is no aortic valve stenosis.   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 Successful DES PCI of the RI lesions with 1 single stent (Onyx frontier DES 2.5 mm at 34 mm postdilated from 3.1 to 2.8 mm) Severely elevated LVEDP with systemic hypertension Morbidly obese     RECOMMENDATIONS Femoral sheath removal based on ACT and PACU holding area. With elevated LVEDP, I have ordered 40 mg IV Lasix to be given in PACU.  Would consider the possibility of oral loop diuretic on discharge Continue Aggressive Cardiac Risk Factor Medication/Guideline Directed Medical Therapy Needs to lose weight  Diagnostic Dominance: Right Intervention   Patient Profile       55 y.o. 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   Assessment & Plan    1. Unstable angina in patient with CAD s/p PCI/DES to LAD and RCA most recently in 2020:  - hx med non-compliance, says will do better - very mild trop elevation - s/p cath, results above, had DES RI - cont ASA, Brilinta, BB, Lipitor - Pharmacy to ck cost of Brilinta  2. Acute on chronic combined CHF:  - hx EF 40-45% at NSTEMI 12/2020 s/p DES LAD & RCA - EF 50-55% this admit w/ G2DD - po Lasix 10/3-4, changed to Lasix 40 mg IV BID x 3 doses, now back on Lasix 40 mg po qd given bump in Cr - wt 334 in 2020 >> was 374 lbs on admit >> 365 lbs today - encourage daily wts and low Na diet - hold spiro 50 mg qd for now, has had today  3. HTN:  - BP 204/128 on admit - home rx Coreg  25 mg bid, catapres 0.3 mg bid, hydralazine 100 mg tid, isordil 20 mg tid, Norvasc 10 mg qd, aldactone 50 mg qd, spiro 50 mg qd  - he is on these meds except isordil >> Imdur 30  - will increase Imdur to 60 mg qd - not on ACE/ARB, but w/ Cr bump, will hold off   4. HLD:  - not taking Lipitor pta, LDL 128 - on now, f/u as outpt   5. Cocaine abuse:  -  admitted to using pta - encourage cessation   6. Tobacco abuse:  - not interested in quitting   7. GERD:  - no sx - cont PPI   8. Joint pains:  - like has rotator cuff injury, by description of injury and exam - explained that he needs to be cardiac stable before anything can be done >> therefore eval will be as outpt - cont home gabapentin dose, add lido patch - f/u w/ PCP as outpt   9 Psychosocial issues:  - even though mult meds are $4, including Brilinta, he is on so many that it adds up -compliance encouraged, he may need to speak to case mgr  10. Super morbid obesity - encouraged him to find a way to lose wt, he refuses nutrition consult  For questions or updates, please contact [facility] Please consult [email or web address] for contact info under      Signed, Theodore Demark, PA-C  06/28/2021, 7:56 AM    Patient seen and examined.  Agree with above documentation.  On exam, patient is alert and oriented, regular rate and rhythm, no murmurs, diminished breath sounds, race LE edema, difficult to assess JVD given body habitus.  Incomplete I/os, recorded as -500 cc yesterday.  Creatinine bumped from 1.2-1.5.  We will switch to p.o. Lasix.  If creatinine stable/improving tomorrow, can plan for discharge.  He is also reporting significant left shoulder pain, will check shoulder x-ray  Little Ishikawa, MD

## 2021-06-28 NOTE — TOC Benefit Eligibility Note (Addendum)
Patient Product/process development scientist completed.    The patient is currently admitted and upon discharge could be taking Brilinta 90 mg.  The current 30 day co-pay is, $4.00.   The patient is insured through [employer] and [employer] [place]     Roland Earl, CPhT Pharmacy Patient Advocate Specialist [facility] Antimicrobial Stewardship Team Direct Number: [phone]  Fax: [phone]

## 2021-06-28 NOTE — Plan of Care (Signed)

## 2021-06-29 ENCOUNTER — Other Ambulatory Visit (HOSPITAL_COMMUNITY): Payer: Self-pay

## 2021-06-29 DIAGNOSIS — I5043 Acute on chronic combined systolic (congestive) and diastolic (congestive) heart failure: Secondary | ICD-10-CM

## 2021-06-29 DIAGNOSIS — N179 Acute kidney failure, unspecified: Secondary | ICD-10-CM

## 2021-06-29 DIAGNOSIS — M25512 Pain in left shoulder: Secondary | ICD-10-CM

## 2021-06-29 LAB — CBC
HCT: 44.3 % (ref 39.0–52.0)
Hemoglobin: 14.6 g/dL (ref 13.0–17.0)
MCH: 30.9 pg (ref 26.0–34.0)
MCHC: 33 g/dL (ref 30.0–36.0)
MCV: 93.7 fL (ref 80.0–100.0)
Platelets: 241 10*3/uL (ref 150–400)
RBC: 4.73 MIL/uL (ref 4.22–5.81)
RDW: 14.6 % (ref 11.5–15.5)
WBC: 9.2 10*3/uL (ref 4.0–10.5)
nRBC: 0 % (ref 0.0–0.2)

## 2021-06-29 LAB — MAGNESIUM: Magnesium: 2.4 mg/dL (ref 1.7–2.4)

## 2021-06-29 LAB — BASIC METABOLIC PANEL
Anion gap: 10 (ref 5–15)
BUN: 23 mg/dL — ABNORMAL HIGH (ref 6–20)
CO2: 28 mmol/L (ref 22–32)
Calcium: 9.9 mg/dL (ref 8.9–10.3)
Chloride: 97 mmol/L — ABNORMAL LOW (ref 98–111)
Creatinine, Ser: 1.44 mg/dL — ABNORMAL HIGH (ref 0.61–1.24)
GFR, Estimated: 57 mL/min — ABNORMAL LOW (ref >60)
Glucose, Bld: 226 mg/dL — ABNORMAL HIGH (ref 70–99)
Potassium: 4.8 mmol/L (ref 3.5–5.1)
Sodium: 135 mmol/L (ref 135–145)

## 2021-06-29 LAB — GLUCOSE, CAPILLARY
Glucose-Capillary: 205 mg/dL — ABNORMAL HIGH (ref 70–99)
Glucose-Capillary: 235 mg/dL — ABNORMAL HIGH (ref 70–99)

## 2021-06-29 MED ORDER — ASPIRIN 81 MG PO TBEC
81.0000 mg | DELAYED_RELEASE_TABLET | Freq: Every day | ORAL | 3 refills | Status: DC
  Filled 2021-06-29: qty 90, 90d supply, fill #0

## 2021-06-29 MED ORDER — NITROGLYCERIN 0.4 MG SL SUBL
0.4000 mg | SUBLINGUAL_TABLET | SUBLINGUAL | 3 refills | Status: AC | PRN
  Filled 2021-06-29: qty 25, 7d supply, fill #0

## 2021-06-29 MED ORDER — ISOSORBIDE MONONITRATE ER 60 MG PO TB24
60.0000 mg | ORAL_TABLET | Freq: Every day | ORAL | 6 refills | Status: DC
  Filled 2021-06-29: qty 30, 30d supply, fill #0

## 2021-06-29 MED ORDER — AMLODIPINE BESYLATE 10 MG PO TABS: 10.0000 mg | ORAL_TABLET | Freq: Every day | ORAL | 11 refills | Status: DC

## 2021-06-29 MED ORDER — FUROSEMIDE 40 MG PO TABS: 40.0000 mg | ORAL_TABLET | Freq: Every day | ORAL | 6 refills | Status: DC

## 2021-06-29 MED ORDER — TICAGRELOR 90 MG PO TABS
90.0000 mg | ORAL_TABLET | Freq: Two times a day (BID) | ORAL | 3 refills | Status: DC
Start: 2021-06-29 — End: 2021-11-16
  Filled 2021-06-29: qty 60, 30d supply, fill #0

## 2021-06-29 MED ORDER — CARVEDILOL 25 MG PO TABS
25.0000 mg | ORAL_TABLET | Freq: Two times a day (BID) | ORAL | 6 refills | Status: DC
  Filled 2021-06-29: qty 60, 30d supply, fill #0

## 2021-06-29 MED ORDER — LIDOCAINE 5 % EX PTCH
1.0000 | MEDICATED_PATCH | CUTANEOUS | 0 refills | Status: DC
  Filled 2021-06-29: qty 30, 30d supply, fill #0

## 2021-06-29 MED ORDER — HYDRALAZINE HCL 100 MG PO TABS
100.0000 mg | ORAL_TABLET | Freq: Three times a day (TID) | ORAL | 6 refills | Status: DC
  Filled 2021-06-29: qty 90, 30d supply, fill #0

## 2021-06-29 MED ORDER — CLONIDINE HCL 0.3 MG PO TABS
0.3000 mg | ORAL_TABLET | Freq: Two times a day (BID) | ORAL | 6 refills | Status: DC
  Filled 2021-06-29: qty 60, 30d supply, fill #0

## 2021-06-29 MED ORDER — ATORVASTATIN CALCIUM 80 MG PO TABS
80.0000 mg | ORAL_TABLET | Freq: Every day | ORAL | 11 refills | Status: DC
Start: 2021-06-29 — End: 2021-11-16

## 2021-06-29 NOTE — Progress Notes (Signed)
Pt discharge education complete. Pt received TOC meds. Pt also received three printed prescriptions for amlodipine, atorvastatin and furosemide. Pt advised to fill these prescriptions at his pharmacy of choice, pt verbalized understanding.    Joylene Grapes, RN, BSN

## 2021-06-29 NOTE — Plan of Care (Signed)
Remains intermittently inappropriate with staff and sometimes speaks more crudely, especially when irritable. Significant intake overnight including 2 sandwiches and food brought by son. CBG increase to 226. Vitals stable with minor pain stated in left shoulder. Anticipates possible discharge today, though BUN/creatinine not significantly dropped from 10/6. Patient stated will leave today no matter what. Has concerns about meds and follow-up for discharge, Case manager likely aware.   Problem: Education: Goal: Knowledge of General Education information will improve Description: Including pain rating scale, medication(s)/side effects and non-pharmacologic comfort measures Outcome: Progressing   Problem: Health Behavior/Discharge Planning: Goal: Ability to manage health-related needs will improve Outcome: Progressing   Problem: Clinical Measurements: Goal: Ability to maintain clinical measurements within normal limits will improve Outcome: Progressing Goal: Will remain free from infection Outcome: Progressing Goal: Diagnostic test results will improve Outcome: Progressing Goal: Respiratory complications will improve Outcome: Progressing Goal: Cardiovascular complication will be avoided Outcome: Progressing   Problem: Activity: Goal: Risk for activity intolerance will decrease Outcome: Progressing   Problem: Coping: Goal: Level of anxiety will decrease Outcome: Progressing   Problem: Elimination: Goal: Will not experience complications related to bowel motility Outcome: Progressing Goal: Will not experience complications related to urinary retention Outcome: Progressing   Problem: Pain Managment: Goal: General experience of comfort will improve Outcome: Progressing   Problem: Safety: Goal: Ability to remain free from injury will improve Outcome: Progressing   Problem: Skin Integrity: Goal: Risk for impaired skin integrity will decrease Outcome: Progressing   Problem:  Education: Goal: Understanding of CV disease, CV risk reduction, and recovery process will improve Outcome: Progressing Goal: Individualized Educational Video(s) Outcome: Progressing   Problem: Activity: Goal: Ability to return to baseline activity level will improve Outcome: Progressing   Problem: Cardiovascular: Goal: Ability to achieve and maintain adequate cardiovascular perfusion will improve Outcome: Progressing Goal: Vascular access site(s) Level 0-1 will be maintained Outcome: Progressing   Problem: Health Behavior/Discharge Planning: Goal: Ability to safely manage health-related needs after discharge will improve Outcome: Progressing

## 2021-06-29 NOTE — Progress Notes (Signed)
CARDIAC REHAB PHASE I   Reinforced stent education. Encouraged continued ambulation. Referred to [facility] [place].  [id number] Reynold Bowen, RN BSN 06/29/2021 2:49 PM

## 2021-06-29 NOTE — Discharge Summary (Addendum)
Discharge Summary    Patient ID: Chris Powell MRN: [id number]; DOB: 03-20-1966  Admit date: 06/23/2021 Discharge date: 06/29/2021  PCP:  Patient, No Pcp Per (Inactive)   [facility] Providers Cardiologist:  Kristeen Miss, MD        Discharge Diagnoses    Principal Problem:   Unstable angina [facility] Active Problems:   Morbid obesity-BMI 51   HTN (hypertension)   Smoking   Cocaine abuse (HCC)   Hyperlipidemia LDL goal <70   AKI (acute kidney injury) (HCC)   Coronary artery disease   Chest pain   Chest pain with moderate risk for cardiac etiology   Acute on chronic combined systolic and diastolic CHF (congestive heart failure) (HCC)   Left shoulder pain    Diagnostic Studies/Procedures    Echocardiogram 06/24/21: 1. Poor acoustical windows even with definity. Left ventricular ejection  fraction, by estimation, is 50 to 55%. The left ventricle has low normal  function. The left ventricle has no regional wall motion abnormalities.  There is moderate concentric left  ventricular hypertrophy. Left ventricular diastolic parameters are  consistent with Grade II diastolic dysfunction (pseudonormalization).   2. Right ventricular systolic function is normal. The right ventricular  size is mildly enlarged. Tricuspid regurgitation signal is inadequate for  assessing PA pressure.   3. Left atrial size was moderately dilated.   4. The mitral valve is normal in structure. No evidence of mitral valve  regurgitation. No evidence of mitral stenosis.   5. The aortic valve is normal in structure. Aortic valve regurgitation is  not visualized. No aortic stenosis is present.   6. The inferior vena cava is normal in size with greater than 50%  respiratory variability, suggesting right atrial pressure of 3 mmHg.   Left heart catheterization 06/27/21:   CULPRIT LESION: Ramus-1 lesion is 90% stenosed.  Ramus-2 lesion is 70% stenosed.   A drug-eluting stent was successfully placed,  covering both lesions, using a STENT ONYX FRONTIER 2.5X34.  Postdilated tapered fashion from 3.1 to 2.8 mm   Post intervention, there is a 0% residual stenosis for both lesions.   Prox LAD to Mid LAD lesion is 25% stenosed -> just prior to stented segment   Mid LAD to Dist LAD overlapping stents is 25% stenosed.  (In-stent restenosis   Previously placed Prox RCA stent (drug-eluting) is  widely patent.   --------------------------------------------------------------   There is no aortic valve stenosis.   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 Successful DES PCI of the RI lesions with 1 single stent (Onyx frontier DES 2.5 mm at 34 mm postdilated from 3.1 to 2.8 mm) Severely elevated LVEDP with systemic hypertension Morbidly obese     RECOMMENDATIONS Femoral sheath removal based on ACT and PACU holding area. With elevated LVEDP, I have ordered 40 mg IV Lasix to be given in PACU.  Would consider the possibility of oral loop diuretic on discharge Continue Aggressive Cardiac Risk Factor Medication/Guideline Directed Medical Therapy Needs to lose weight   Diagnostic Dominance: Right Intervention    _____________   History of Present Illness     Chris Powell is a 55 year old male with coronary artery disease, type 2 diabetes, hypertension,  hyperlipidemia, heart failure, GERD, OSA who initially presented 06/23/21 with progressive shortness of breath and chest discomfort.  Notes he has been having progressive and intermittent chest discomfort over the last month.  Notes that over the last few days his symptoms have been escalating.   Unfortunately, notes that he has run out of his medications recently and has been off of many of his antihypertensives, diuretic, antiplatelet, and lipid therapy over the last 7 days. He feels his symptoms started prior to this week, but are worse now. He also has been dealing with the stress of the death of his brother that happened a little over a month ago.  Denies any other symptoms.  Hospital Course     Consultants: None   1. Unstable angina in patient with CAD s/p PCI/DES to LAD and RCA most recently in 2020: he presented with intermittent chest pain, often occurring under emotional stress. He has had medication non-compliance. EKG shows chronic inferolateral TWI. HsTrop in the 30s with flat trend. Echo this admission showed EF 50-55% (previously 40-45% 12/2020). G2DD, moderate concentric LVH, mild RV enlargement, moderate LAE, and no significant valvular abnormalities. Given recurrent chest pain he went for [facility] 06/26/21 which showed patent RCA and LAD stents with 25% ISR of LAD, 25% pLAD stenosis prior to stent, and 90% pRI followed by 70% mRI stenosis managed with PCI/DES. LVEDP was noted to be severely elevated with systemic HTN. He was recommended for DAPT x1 year and was started on aspirin and brilinta   - Continue aspirin and brilinta  - Continue atorvastatin - Continue Bblocker   2. Acute on chronic combined CHF: Patient with history of ICM with EF 40-45% at the time of NSTEMI 12/2020. He had stenting to LAD and RCA at that time but no follow-up echo to re-evaluate LV function as he was lost to follow-up. Echo this admission with improvement in EF to 50-55%, G2DD, no RWMA. Volume status is difficult to gauge with his morbid obesity. CXR showed cardiomegaly with mild interstitial edema. LVEDP was severely elevated on cath. He was diuresed with IV lasix until bump in Cr.  Weight 369lbs on the day of discharge.  - Continue carvedilol, hydralazine, and imdur - Continue lasix 40mg  daily - Continue  to monitor strict I&Os and daily weights - Continue to monitor electrolytes and replete as needed to maintain K >4, Mg >2   3. HTN: BP elevated on admission. Improved with medication adjustments.  - Continue amlodipine, carvedilol, clonidine, hydralazine, and lasix.   4. HLD: LDL 128 on lipids this admission. He reports being out of his atorvastatin for at least 1 week - Continue atorvastatin 80mg  daily - Low threshold to refer to lipid clinic if LDL remains >70 on next labs and compliance assured   5. DM type 2: A1C 6.6 this admission. New diagnosis, previously in the pre-DM range. Given dietary instructions - Consider addition of metformin or SGLT2-inhibitor outpatient if Cr continues to improve  6. Cocaine abuse: he reports using cocaine 3x in the past month, including once in the past week. Unclear how much this has contributed to #1. We discussed importance of cessation and recommendations to see psychiatry outpatient to develop healthier coping strategies to deal with his grief.  - Continue to encourage cessation   7. Tobacco abuse: still smoking. We had a long discussion on quitting which would improve his financial situation. He reports smoking is the only thing that calms him down at times. Doubtful he will quit - Continue  to encourage cessation   8. GERD: stable - Continue omeprazole at discharge   9. Joint pains: reports left shoulder and right hip pain. Has known arthritis and recommended for right hip replacement. Left shoulder XR is without fracture but shows moderate left AC degenerative arthritis. He was given lidocaine patches with some improvement. Patient instructed to avoid NSAID for pain management to minimize bleeding risk.  - Will place referral to orthopedics for further evaluation of shoulder pain  - Continue lidocaine patches - Continue gabapentin for neuropathy pains   10. Psychosocial issues: patient reports difficulty affording medications. CM reported $4  copays for medications. Looks like he was connected with social work outpatient following his NSTEMI in 2020 but was lost to follow-up.  - Will likely need SW involvement outpatient to assist with medication needs.   11. AKI: Cre bumped to 1.53 with diuresis. Improved to 1.44 on the day of discharge (baseline 1.1) - Would check BMET at follow-up      Did the patient have an acute coronary syndrome (MI, NSTEMI, STEMI, etc) this admission?:  No                               Did the patient have a percutaneous coronary intervention (stent / angioplasty)?:  Yes.     Cath/PCI Registry Performance & Quality Measures: Aspirin prescribed? - Yes ADP Receptor Inhibitor (Plavix/Clopidogrel, Brilinta/Ticagrelor or Effient/Prasugrel) prescribed (includes medically managed patients)? - Yes High Intensity Statin (Lipitor 40-80mg  or Crestor 20-40mg ) prescribed? - Yes For EF <40%, was ACEI/ARB prescribed? - Not Applicable (EF >/= 40%) For EF <40%, Aldosterone Antagonist (Spironolactone or Eplerenone) prescribed? - Not Applicable (EF >/= 40%) Cardiac Rehab Phase II ordered? - Yes      _____________  Discharge Vitals Blood pressure 125/65, pulse 79, temperature 98 F (36.7 C), temperature source Oral, resp. rate 20, height 5' 7.5" (1.715 m), weight (!) 167.6 kg, SpO2 100 %.  Filed Weights   06/27/21 0412 06/28/21 0456 06/29/21 0432  Weight: (!) 166.2 kg (!) 166 kg (!) 167.6 kg    Labs & Radiologic Studies    CBC Recent Labs    06/28/21 0150 06/29/21 0229  WBC 8.9 9.2  HGB 14.4 14.6  HCT 44.3 44.3  MCV 93.3 93.7  PLT 255 241   Basic Metabolic Panel Recent Labs    40/98/11 0150 06/29/21 0229  NA 135 135  K 3.9 4.8  CL 96* 97*  CO2 30 28  GLUCOSE 169* 226*  BUN 23* 23*  CREATININE 1.53* 1.44*  CALCIUM 10.2 9.9  MG 2.3 2.4   Liver Function Tests No results for input(s): AST, ALT, ALKPHOS, BILITOT, PROT, ALBUMIN in the last 72 hours. No results for input(s): LIPASE, AMYLASE in  the last 72 hours. High Sensitivity Troponin:   Recent Labs  Lab 06/23/21 1900 06/23/21 2133  TROPONINIHS 37* 36*    BNP Invalid input(s): POCBNP D-Dimer No results for input(s): DDIMER in the last 72 hours. Hemoglobin A1C No results for input(s): HGBA1C in the last 72 hours. Fasting Lipid Panel No results for input(s): CHOL, HDL, LDLCALC, TRIG, CHOLHDL, LDLDIRECT in the last 72 hours. Thyroid Function Tests No results for input(s): TSH, T4TOTAL, T3FREE, THYROIDAB in the last 72 hours.  Invalid input(s): FREET3 _____________  DG Chest 2 View  Result Date: 06/23/2021 CLINICAL DATA:  Shortness of breath. EXAM: CHEST - 2 VIEW COMPARISON:  Chest x-ray 01/06/2019. FINDINGS: The heart is mildly  enlarged. There central pulmonary vascular congestion. There are minimal interstitial opacities in the lung bases. There is no focal lung infiltrate. Costophrenic angles are clear. There is no pneumothorax or acute fracture. IMPRESSION: 1. Cardiomegaly with mild interstitial edema. Electronically Signed   By: Darliss Cheney M.D.   On: 06/23/2021 18:37   CARDIAC CATHETERIZATION  Result Date: 06/26/2021   CULPRIT LESION: Ramus-1 lesion is 90% stenosed.  Ramus-2 lesion is 70% stenosed.   A drug-eluting stent was successfully placed, covering both lesions, using a STENT ONYX FRONTIER 2.5X34.  Postdilated tapered fashion from 3.1 to 2.8 mm   Post intervention, there is a 0% residual stenosis for both lesions.   Prox LAD to Mid LAD lesion is 25% stenosed -> just prior to stented segment   Mid LAD to Dist LAD overlapping stents is 25% stenosed.  (In-stent restenosis   Previously placed Prox RCA stent (drug-eluting) is  widely patent.   --------------------------------------------------------------   There is no aortic valve stenosis.   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 Successful DES PCI of the RI lesions with 1 single stent (Onyx frontier DES 2.5 mm at 34 mm postdilated from 3.1 to 2.8 mm) Severely elevated LVEDP with systemic hypertension Morbidly obese RECOMMENDATIONS Femoral sheath removal based on ACT and PACU holding area. With elevated LVEDP, I have ordered 40 mg IV Lasix to be given in PACU.  Would consider the possibility of oral loop diuretic on discharge Continue Aggressive Cardiac Risk Factor Medication/Guideline Directed Medical Therapy Needs to lose weight Bryan Lemma, MD  DG Shoulder Left  Result Date: 06/28/2021 CLINICAL DATA:  Left shoulder pain EXAM: LEFT SHOULDER - 2+ VIEW COMPARISON:  None. FINDINGS: Normal alignment. No fracture or dislocation. Glenohumeral joint space is not well profiled. Moderate degenerative arthritis of the acromioclavicular joint. Limited evaluation of the left hemithorax is unremarkable. IMPRESSION: No acute fracture or dislocation. Moderate left AC degenerative arthritis. Electronically Signed   By: Helyn Numbers M.D.   On: 06/28/2021 15:47   ECHOCARDIOGRAM COMPLETE  Result Date: 06/24/2021    ECHOCARDIOGRAM REPORT   Patient Name:   YESENIA FONTENETTE Date of Exam: 06/24/2021 Medical Rec #:  [id number]        Height:       67.5 in Accession #:    [id number]       Weight:       340.0 lb Date of Birth:  March 20, 1966         BSA:          2.548 m Patient Age:    55 years         BP:           133/80 mmHg Patient Gender: M                HR:           69 bpm. Exam Location:  Inpatient Procedure: 2D Echo, Cardiac Doppler, Color Doppler and Intracardiac            Opacification Agent Indications:    Chest Pain R07.9  History:        Patient has prior history of Echocardiogram examinations, most  recent  01/06/2019. CAD, COPD; Risk Factors:Hypertension,                 Diabetes, Dyslipidemia and Morbid Obesity.  Sonographer:    Roosvelt Maser RDCS Referring Phys: [id number] DENNIS NARCISSE JR IMPRESSIONS  1. Poor acoustical windows even with definity. Left ventricular ejection fraction, by estimation, is 50 to 55%. The left ventricle has low normal function. The left ventricle has no regional wall motion abnormalities. There is moderate concentric left ventricular hypertrophy. Left ventricular diastolic parameters are consistent with Grade II diastolic dysfunction (pseudonormalization).  2. Right ventricular systolic function is normal. The right ventricular size is mildly enlarged. Tricuspid regurgitation signal is inadequate for assessing PA pressure.  3. Left atrial size was moderately dilated.  4. The mitral valve is normal in structure. No evidence of mitral valve regurgitation. No evidence of mitral stenosis.  5. The aortic valve is normal in structure. Aortic valve regurgitation is not visualized. No aortic stenosis is present.  6. The inferior vena cava is normal in size with greater than 50% respiratory variability, suggesting right atrial pressure of 3 mmHg. FINDINGS  Left Ventricle: Poor acoustical windows even with definity. Left ventricular ejection fraction, by estimation, is 50 to 55%. The left ventricle has low normal function. The left ventricle has no regional wall motion abnormalities. Definity contrast agent was given IV to delineate the left ventricular endocardial borders. The left ventricular internal cavity size was normal in size. There is moderate concentric left ventricular hypertrophy. Left ventricular diastolic parameters are consistent with Grade II diastolic dysfunction (pseudonormalization). Normal left ventricular filling pressure. Right Ventricle: The right ventricular size is mildly enlarged. No increase in right ventricular wall thickness. Right ventricular systolic function is normal.  Tricuspid regurgitation signal is inadequate for assessing PA pressure. Left Atrium: Left atrial size was moderately dilated. Right Atrium: Right atrial size was normal in size. Pericardium: There is no evidence of pericardial effusion. Mitral Valve: The mitral valve is normal in structure. No evidence of mitral valve regurgitation. No evidence of mitral valve stenosis. Tricuspid Valve: The tricuspid valve is normal in structure. Tricuspid valve regurgitation is not demonstrated. No evidence of tricuspid stenosis. Aortic Valve: The aortic valve is normal in structure. Aortic valve regurgitation is not visualized. No aortic stenosis is present. Aortic valve mean gradient measures 4.0 mmHg. Aortic valve peak gradient measures 8.1 mmHg. Aortic valve area, by VTI measures 2.47 cm. Pulmonic Valve: The pulmonic valve was normal in structure. Pulmonic valve regurgitation is not visualized. No evidence of pulmonic stenosis. Aorta: The aortic root is normal in size and structure. Venous: The inferior vena cava is normal in size with greater than 50% respiratory variability, suggesting right atrial pressure of 3 mmHg. IAS/Shunts: No atrial level shunt detected by color flow Doppler.  LEFT VENTRICLE PLAX 2D LVIDd:         5.20 cm  Diastology LVIDs:         3.40 cm  LV e' medial:    4.90 cm/s LV PW:         1.50 cm  LV E/e' medial:  15.2 LV IVS:        1.40 cm  LV e' lateral:   7.18 cm/s LVOT diam:     2.30 cm  LV E/e' lateral: 10.4 LV SV:         69 LV SV Index:   27 LVOT Area:     4.15 cm  RIGHT VENTRICLE RV Basal diam:  4.30 cm RV Mid  diam:    3.90 cm LEFT ATRIUM              Index       RIGHT ATRIUM           Index LA diam:        4.20 cm  1.65 cm/m  RA Area:     21.40 cm LA Vol (A2C):   119.0 ml 46.71 ml/m RA Volume:   68.80 ml  27.00 ml/m LA Vol (A4C):   113.0 ml 44.35 ml/m LA Biplane Vol: 115.0 ml 45.14 ml/m  AORTIC VALVE AV Area (Vmax):    2.45 cm AV Area (Vmean):   2.24 cm AV Area (VTI):     2.47 cm AV Vmax:            142.00 cm/s AV Vmean:          97.100 cm/s AV VTI:            0.277 m AV Peak Grad:      8.1 mmHg AV Mean Grad:      4.0 mmHg LVOT Vmax:         83.60 cm/s LVOT Vmean:        52.300 cm/s LVOT VTI:          0.165 m LVOT/AV VTI ratio: 0.60  AORTA Ao Root diam: 3.20 cm MITRAL VALVE MV Area (PHT): 3.16 cm    SHUNTS MV Decel Time: 240 msec    Systemic VTI:  0.16 m MV E velocity: 74.60 cm/s  Systemic Diam: 2.30 cm MV A velocity: 78.80 cm/s MV E/A ratio:  0.95 Armanda Magic MD Electronically signed by Armanda Magic MD Signature Date/Time: 06/24/2021/12:41:27 PM    Final    Disposition   Pt is being discharged home today in good condition.  Follow-up Plans & Appointments     Follow-up Information     Nahser, Deloris Ping, MD Follow up on 07/03/2021.   Specialty: Cardiology Why: Please arrive 15 minutes early for your 2:30pm post-hospital cardiology appointment Contact information: [address] [place] [address] [phone]                Discharge Instructions     AMB referral to orthopedics   Complete by: As directed    Left shoulder pain.   Amb Referral to Cardiac Rehabilitation   Complete by: As directed    Diagnosis:  Coronary Stents PTCA     After initial evaluation and assessments completed: Virtual Based Care may be provided alone or in conjunction with Phase 2 Cardiac Rehab based on patient barriers.: Yes       Discharge Medications   Allergies as of 06/29/2021   No Known Allergies      Medication List     STOP taking these medications    clopidogrel 75 MG tablet Commonly known as: PLAVIX   fluticasone 50 MCG/ACT nasal spray Commonly known as: FLONASE   ibuprofen 800 MG tablet Commonly known as: ADVIL   isosorbide dinitrate 20 MG tablet Commonly known as: ISORDIL   potassium chloride 10 MEQ tablet Commonly known as: KLOR-CON   sildenafil 20 MG tablet Commonly known as: REVATIO   spironolactone 50 MG tablet Commonly known  as: ALDACTONE   varenicline 0.5 MG tablet Commonly known as: Chantix       TAKE these medications    acetaminophen 500 MG tablet Commonly known as: TYLENOL Take 500 mg by mouth every 6 (six) hours as needed  for moderate pain.   albuterol 108 (90 Base) MCG/ACT inhaler Commonly known as: VENTOLIN HFA Inhale 2 puffs into the lungs every 6 (six) hours as needed for wheezing or shortness of breath.   amLODipine 10 MG tablet Commonly known as: NORVASC Take 1 tablet (10 mg total) by mouth daily.   aspirin 81 MG EC tablet Take 1 tablet (81 mg total) by mouth daily. Swallow whole. Start taking on: June 30, 2021 What changed: additional instructions   atorvastatin 80 MG tablet Commonly known as: LIPITOR Take 1 tablet (80 mg total) by mouth daily at 6 PM.   carvedilol 25 MG tablet Commonly known as: COREG Take 1 tablet (25 mg total) by mouth 2 (two) times daily with a meal.   Cholecalciferol 50 MCG (2000 UT) Caps Take 2,000 Units by mouth daily.   cloNIDine 0.3 MG tablet Commonly known as: CATAPRES Take 1 tablet (0.3 mg total) by mouth 2 (two) times daily.   furosemide 40 MG tablet Commonly known as: Lasix Take 1 tablet (40 mg total) by mouth daily.   gabapentin 600 MG tablet Commonly known as: NEURONTIN Take 1 tablet (600 mg total) by mouth 3 (three) times daily. What changed: when to take this   hydrALAZINE 100 MG tablet Commonly known as: APRESOLINE Take 1 tablet (100 mg total) by mouth 3 (three) times daily. What changed: additional instructions   isosorbide mononitrate 60 MG 24 hr tablet Commonly known as: IMDUR Take 1 tablet (60 mg total) by mouth daily. Start taking on: June 30, 2021   lidocaine 5 % Commonly known as: LIDODERM Place 1 patch onto the skin daily. Remove & Discard patch within 12 hours or as directed by MD Start taking on: June 30, 2021   nitroGLYCERIN 0.4 MG SL tablet Commonly known as: NITROSTAT Place 1 tablet (0.4 mg total) under  the tongue every 5 (five) minutes x 3 doses as needed for chest pain. What changed: when to take this   omeprazole 20 MG capsule Commonly known as: PRILOSEC Take 1 capsule (20 mg total) by mouth daily before breakfast.   ticagrelor 90 MG Tabs tablet Commonly known as: BRILINTA Take 1 tablet (90 mg total) by mouth 2 (two) times daily.   Visine 0.025-0.3 % ophthalmic solution Generic drug: naphazoline-pheniramine Place 1 drop into both eyes 4 (four) times daily as needed for eye irritation.           Outstanding Labs/Studies   Check BMET at follow-up  Duration of Discharge Encounter   Greater than 30 minutes including physician time.  Signed, Beatriz Stallion, PA-C 06/29/2021, 1:57 PM    Patient seen and examined.  Agree with above documentation.  On exam, patient is alert and oriented, regular rate and rhythm, no murmurs, diminished breath sounds, no LE edema, difficult to assess JVD given body habitus.  Creatinine improved to 1.4 today.  Appears stable on p.o. Lasix.  Stable for discharge today.  Little Ishikawa, MD

## 2021-07-02 MED FILL — Heparin Sod (Porcine)-NaCl IV Soln 1000 Unit/500ML-0.9%: INTRAVENOUS | Qty: 1000 | Status: AC

## 2021-07-02 NOTE — Telephone Encounter (Signed)
**Note De-Identified Lucyle Alumbaugh Obfuscation** Transition Care Management Unsuccessful Follow-up Telephone Call  Date of discharge and from where:  06/30/2021 from [facility]  Attempts:  1st Attempt (Called X 3)  Reason for unsuccessful TCM follow-up call:  Unable to leave message Message received X 3: Your call cannot be completed at this time. Please hang up and try your call again later.

## 2021-07-02 NOTE — Telephone Encounter (Signed)
**Note De-Identified Dailon Sheeran Obfuscation** Because the pts hosp f/u is scheduled for tomorrow at 2:30 with Dr Elease Hashimoto, I attempted to contact him 2 more times today without success (the same message received as earlier today) I will continue to call.

## 2021-07-03 ENCOUNTER — Ambulatory Visit: Payer: Medicaid Other | Admitting: Cardiovascular Disease

## 2021-07-03 NOTE — Telephone Encounter (Signed)
**Note De-Identified Chris Powell Obfuscation** Transition Care Management Unsuccessful Follow-up Telephone Call  Date of discharge and from where:  [facility] on 06/29/2021  Attempts:  2nd Attempt  Reason for unsuccessful TCM follow-up call:  Unable to leave message  I have called the pt X 3 this morning and receive the same message as I did yesterday of: Your call cannot be completed at this time. Please hang up and try your call again later.  I will try to reach him again later today (His hosp f/u is scheduled for today at 2:30 with Dr Elease Hashimoto).

## 2021-07-05 ENCOUNTER — Telehealth: Payer: Self-pay | Admitting: Pharmacist

## 2021-07-05 NOTE — Telephone Encounter (Signed)
Pharmacy Transitions of Care   Called patient and received message: "Your call cannot be completed at this time. Please hang up and try your call again later."   Will retry call in 2-3 business days. 

## 2021-07-09 ENCOUNTER — Telehealth: Payer: Self-pay | Admitting: Pharmacist

## 2021-07-09 NOTE — Telephone Encounter (Signed)
Pharmacy Transitions of Care   Called patient and received message: "Your call cannot be completed at this time. Please hang up and try your call again later."   Will retry call in 2-3 business days. 

## 2021-07-11 ENCOUNTER — Encounter: Payer: Self-pay | Admitting: Surgery

## 2021-07-11 ENCOUNTER — Ambulatory Visit (INDEPENDENT_AMBULATORY_CARE_PROVIDER_SITE_OTHER): Payer: Medicaid Other | Admitting: Surgery

## 2021-07-11 VITALS — BP 149/87 | HR 66 | Ht 67.5 in | Wt 369.5 lb

## 2021-07-11 DIAGNOSIS — M19012 Primary osteoarthritis, left shoulder: Secondary | ICD-10-CM | POA: Diagnosis not present

## 2021-07-11 DIAGNOSIS — M7542 Impingement syndrome of left shoulder: Secondary | ICD-10-CM | POA: Diagnosis not present

## 2021-07-11 NOTE — Progress Notes (Signed)
Office Visit Note   Patient: Chris Powell           Date of Birth: [birth year]           MRN: [id number] Visit Date: 07/11/2021              Requested by: Beatriz Stallion., PA-C [address] [place],  [place] [address] PCP: Patient, No Pcp Per (Inactive)   Assessment & Plan: Visit Diagnoses:  1. Impingement syndrome of left shoulder   2. Arthritis of left acromioclavicular joint     Plan: Today offered left acromioclavicular joint injection but patient stated that he does not want to have an injection.  I advised him that he can try over-the-counter Voltaren gel twice a day.  I will have him follow-up with Dr. Ophelia Charter in 3 weeks for recheck.  If he does try Voltaren gel that does not work and Dr. Ophelia Charter can discuss the injection again possibly.  Patient asked why could he not just get pain medication I told him that I do not prescribe narcotics for the problem that he has.  Follow-Up Instructions: Return in about 3 weeks (around 08/01/2021) for WITH DR YATES TO DISCUSS LEFT SHOULDER PAIN.   Orders:  No orders of the defined types were placed in this encounter.  No orders of the defined types were placed in this encounter.     Procedures: No procedures performed   Clinical Data: No additional findings.   Subjective: Chief Complaint  Patient presents with   Left Shoulder - Pain    HPI 55 year old black male comes in for evaluation of left shoulder pain.  Patient was admitted to the hospital couple weeks ago for chest pain and shortness of breath.  During that visit he brought up some issues with left shoulder pain.  Today patient localizes pain more to the [facility] joint.  Shoulder pain aggravated with overhead activity.  States that this may have been aggravated when he had been doing some work on his car.  No cervical spinal radicular component. Review of Systems No current cardiac pulmonary GI GU issues  Objective: Vital Signs: BP (!) 149/87   Pulse 66   Ht 5'  7.5" (1.715 m)   Wt (!) 369 lb 8 oz (167.6 kg)   BMI 57.02 kg/m   Physical Exam Constitutional:      Appearance: He is obese.  HENT:     Head: Normocephalic.  Pulmonary:     Effort: No respiratory distress.  Musculoskeletal:     Comments: Patient has some limitation in left shoulder range of motion with flexion and abduction due to pain.  Active motion to about 90 to 100 degrees.  Passively I can get him full.  Negative drop arm test.  Positive impingement test.  Marked tenderness over the [facility] joint.  Pain with cross body adduction.  Neurological:     General: No focal deficit present.     Mental Status: He is alert and oriented to person, place, and time.    Ortho Exam  Specialty Comments:  No specialty comments available.  Imaging: No results found.   PMFS History: Patient Active Problem List   Diagnosis Date Noted   Acute on chronic combined systolic and diastolic CHF (congestive heart failure) (HCC) 06/29/2021   Left shoulder pain 06/29/2021   Chest pain with moderate risk for cardiac etiology 06/25/2021   Unstable angina (HCC) 06/23/2021   Chest pain 06/23/2021   OSA on CPAP 02/17/2019  Coronary artery disease 01/21/2019   Non-ST elevation (NSTEMI) myocardial infarction [facility])    Acute hypercapnic respiratory failure (HCC) 01/06/2019   Acute pulmonary edema (HCC) 01/06/2019   AKI (acute kidney injury) (HCC) 01/06/2019   Elevated troponin 01/06/2019   Shortness of breath 10/05/2018   Right rotator cuff tear 01/06/2018   Angina pectoris (HCC) 12/16/2017   Type 2 diabetes mellitus with renal complication (HCC) 11/27/2017   Erectile dysfunction 09/05/2016   Chronic pain syndrome 01/11/2016   AC (acromioclavicular) arthritis 09/06/2015   Sprain of right shoulder 09/06/2015   Metabolic syndrome 08/29/2015   Abdominal wall pain 08/15/2015   Disorder of ligament of right wrist 08/09/2015   Ganglion of right wrist 08/09/2015   Primary osteoarthritis of right hand  08/09/2015   Right wrist pain 07/13/2015   GERD (gastroesophageal reflux disease) 02/28/2015   Vitamin D deficiency 02/28/2015   Hyperlipidemia LDL goal <70 02/28/2015   Chronic knee pain 02/28/2015   Cocaine abuse (HCC) 01/25/2015   Hx of non-ST elevation myocardial infarction (NSTEMI) 01/24/2015   CAD - S/P LAD DES 3/11/22/14 12/23/2014   Morbid obesity-BMI 51 12/23/2014   COPD with exacerbation (HCC) 12/23/2014   HTN (hypertension) 12/23/2014   Smoking 12/23/2014   Non-sustained ventricular tachycardia 12/23/2014   Hypokalemia 12/23/2014   Acute coronary syndrome (HCC) 12/22/2014   Past Medical History:  Diagnosis Date   Anemia    Arthritis    Asthma    Bone spur of ankle    right    CHF (congestive heart failure) (HCC)    COPD (chronic obstructive pulmonary disease) (HCC)    Coronary artery disease    Depression    Diabetes (HCC)    borderline    GERD (gastroesophageal reflux disease)    Hyperlipidemia    Hypertension    Myocardial infarction (HCC)    Pneumonia    HX OF PNA   Right rotator cuff tear 01/06/2018   Shortness of breath dyspnea    Sleep apnea    USES CPAP    Family History  Problem Relation Age of Onset   Heart attack Father    Diabetes Father    Cancer Mother        leukemia   Hypertension Mother    Hyperlipidemia Mother    Cancer Sister    Colon cancer Neg Hx     Past Surgical History:  Procedure Laterality Date   CARDIAC CATHETERIZATION  12/22/2014   CORONARY ANGIOPLASTY     CORONARY STENT INTERVENTION N/A 01/12/2019   Procedure: CORONARY STENT INTERVENTION;  Surgeon: Tonny Bollman, MD;  Location: [facility] INVASIVE CV LAB;  Service: Cardiovascular;  Laterality: N/A;   CORONARY STENT INTERVENTION N/A 06/26/2021   Procedure: CORONARY STENT INTERVENTION;  Surgeon: Marykay Lex, MD;  Location: [facility] INVASIVE CV LAB;  Service: Cardiovascular;  Laterality: N/A;   CORONARY STENT PLACEMENT  12/22/2014   LAD   KNEE SURGERY     LEFT HEART CATH N/A  01/12/2019   Procedure: Left Heart Cath;  Surgeon: Tonny Bollman, MD;  Location: [facility] INVASIVE CV LAB;  Service: Cardiovascular;  Laterality: N/A;   LEFT HEART CATH AND CORONARY ANGIOGRAPHY N/A 12/16/2017   Procedure: LEFT HEART CATH AND CORONARY ANGIOGRAPHY;  Surgeon: Swaziland, Peter M, MD;  Location: [facility] INVASIVE CV LAB;  Service: Cardiovascular;  Laterality: N/A;   LEFT HEART CATH AND CORONARY ANGIOGRAPHY N/A 06/26/2021   Procedure: LEFT HEART CATH AND CORONARY ANGIOGRAPHY;  Surgeon: Marykay Lex, MD;  Location: [facility] INVASIVE CV  LAB;  Service: Cardiovascular;  Laterality: N/A;   LEFT HEART CATHETERIZATION WITH CORONARY ANGIOGRAM N/A 12/22/2014   Procedure: LEFT HEART CATHETERIZATION WITH CORONARY ANGIOGRAM;  Surgeon: Corky Crafts, MD;  Location: [facility] CATH LAB;  Service: Cardiovascular;  Laterality: N/A;   RIGHT/LEFT HEART CATH AND CORONARY ANGIOGRAPHY N/A 01/08/2019   Procedure: RIGHT/LEFT HEART CATH AND CORONARY ANGIOGRAPHY;  Surgeon: Dolores Patty, MD;  Location: [facility] INVASIVE CV LAB;  Service: Cardiovascular;  Laterality: N/A;   SHOULDER ARTHROSCOPY WITH ROTATOR CUFF REPAIR AND SUBACROMIAL DECOMPRESSION Right 01/06/2018   Procedure: RIGHT SHOULDER ARTHROSCOPY  DEBRIDEMENT,ACROMIOPLASTY, ROTATOR CUFF REPAIR;  Surgeon: Teryl Lucy, MD;  Location: [facility] OR;  Service: Orthopedics;  Laterality: Right;   Social History   Occupational History   Not on file  Tobacco Use   Smoking status: Every Day    Packs/day: 1.00    Years: 30.00    Pack years: 30.00    Types: Cigarettes   Smokeless tobacco: Never   Tobacco comments:    " I have  tried vapor cigarettes"  Vaping Use   Vaping Use: Former  Substance and Sexual Activity   Alcohol use: Yes    Alcohol/week: 0.0 standard drinks    Comment: occ beer    Drug use: Yes    Types: Cocaine   Sexual activity: Not on file

## 2021-07-12 ENCOUNTER — Emergency Department (HOSPITAL_COMMUNITY)
Admission: EM | Admit: 2021-07-12 | Discharge: 2021-07-12 | Disposition: A | Discharge status: Home / Self Care | Payer: Medicaid Other | Attending: Emergency Medicine | Admitting: Emergency Medicine

## 2021-07-12 ENCOUNTER — Encounter (HOSPITAL_COMMUNITY): Payer: Self-pay | Admitting: Emergency Medicine

## 2021-07-12 ENCOUNTER — Other Ambulatory Visit (HOSPITAL_COMMUNITY): Payer: Self-pay

## 2021-07-12 ENCOUNTER — Telehealth (HOSPITAL_COMMUNITY): Payer: Self-pay

## 2021-07-12 DIAGNOSIS — T405X1A Poisoning by cocaine, accidental (unintentional), initial encounter: Secondary | ICD-10-CM | POA: Diagnosis not present

## 2021-07-12 DIAGNOSIS — F17210 Nicotine dependence, cigarettes, uncomplicated: Secondary | ICD-10-CM | POA: Insufficient documentation

## 2021-07-12 DIAGNOSIS — I252 Old myocardial infarction: Secondary | ICD-10-CM | POA: Insufficient documentation

## 2021-07-12 DIAGNOSIS — I2510 Atherosclerotic heart disease of native coronary artery without angina pectoris: Secondary | ICD-10-CM | POA: Diagnosis not present

## 2021-07-12 DIAGNOSIS — E119 Type 2 diabetes mellitus without complications: Secondary | ICD-10-CM | POA: Insufficient documentation

## 2021-07-12 DIAGNOSIS — I110 Hypertensive heart disease with heart failure: Secondary | ICD-10-CM | POA: Diagnosis not present

## 2021-07-12 DIAGNOSIS — J45909 Unspecified asthma, uncomplicated: Secondary | ICD-10-CM | POA: Diagnosis not present

## 2021-07-12 DIAGNOSIS — I509 Heart failure, unspecified: Secondary | ICD-10-CM | POA: Insufficient documentation

## 2021-07-12 DIAGNOSIS — T40601A Poisoning by unspecified narcotics, accidental (unintentional), initial encounter: Secondary | ICD-10-CM

## 2021-07-12 DIAGNOSIS — J449 Chronic obstructive pulmonary disease, unspecified: Secondary | ICD-10-CM | POA: Diagnosis not present

## 2021-07-12 DIAGNOSIS — Z951 Presence of aortocoronary bypass graft: Secondary | ICD-10-CM | POA: Insufficient documentation

## 2021-07-12 DIAGNOSIS — T50901A Poisoning by unspecified drugs, medicaments and biological substances, accidental (unintentional), initial encounter: Secondary | ICD-10-CM | POA: Diagnosis present

## 2021-07-12 LAB — CBC
HCT: 46.3 % (ref 39.0–52.0)
Hemoglobin: 14.8 g/dL (ref 13.0–17.0)
MCH: 31 pg (ref 26.0–34.0)
MCHC: 32 g/dL (ref 30.0–36.0)
MCV: 97.1 fL (ref 80.0–100.0)
Platelets: 278 10*3/uL (ref 150–400)
RBC: 4.77 MIL/uL (ref 4.22–5.81)
RDW: 14.6 % (ref 11.5–15.5)
WBC: 11.5 10*3/uL — ABNORMAL HIGH (ref 4.0–10.5)
nRBC: 0 % (ref 0.0–0.2)

## 2021-07-12 LAB — COMPREHENSIVE METABOLIC PANEL
ALT: 21 U/L (ref 0–44)
AST: 15 U/L (ref 15–41)
Albumin: 3.6 g/dL (ref 3.5–5.0)
Alkaline Phosphatase: 69 U/L (ref 38–126)
Anion gap: 6 (ref 5–15)
BUN: 19 mg/dL (ref 6–20)
CO2: 28 mmol/L (ref 22–32)
Calcium: 9.1 mg/dL (ref 8.9–10.3)
Chloride: 106 mmol/L (ref 98–111)
Creatinine, Ser: 1.43 mg/dL — ABNORMAL HIGH (ref 0.61–1.24)
GFR, Estimated: 58 mL/min — ABNORMAL LOW (ref >60)
Glucose, Bld: 126 mg/dL — ABNORMAL HIGH (ref 70–99)
Potassium: 3.3 mmol/L — ABNORMAL LOW (ref 3.5–5.1)
Sodium: 140 mmol/L (ref 135–145)
Total Bilirubin: 0.4 mg/dL (ref 0.3–1.2)
Total Protein: 7.7 g/dL (ref 6.5–8.1)

## 2021-07-12 LAB — RAPID URINE DRUG SCREEN, HOSP PERFORMED
Amphetamines: NOT DETECTED
Barbiturates: NOT DETECTED
Benzodiazepines: NOT DETECTED
Cocaine: POSITIVE — AB
Opiates: NOT DETECTED
Tetrahydrocannabinol: NOT DETECTED

## 2021-07-12 LAB — CBG MONITORING, ED: Glucose-Capillary: 174 mg/dL — ABNORMAL HIGH (ref 70–99)

## 2021-07-12 LAB — ETHANOL: Alcohol, Ethyl (B): <10 mg/dL (ref <10)

## 2021-07-12 MED ORDER — NALOXONE HCL 0.4 MG/ML IJ SOLN
0.4000 mg | Freq: Once | INTRAMUSCULAR | Status: AC
  Administered 2021-07-12: 0.4 mg via INTRAVENOUS
  Filled 2021-07-12: qty 1

## 2021-07-12 NOTE — ED Triage Notes (Signed)
Pt arrives via [facility] after being called out for unresponsive pt. EMS arrived to find pt with anginal respirations. Pt given 4mg  nasal narcan, and 1mg  IV narcan by EMS. Pt arrives A&Ox4. Pt admits to smoking crack about 2 hrs ago.

## 2021-07-12 NOTE — ED Notes (Signed)
Pt awake and alert. Pt stood with no assistance and cleaned self. Pt back in bed at this time

## 2021-07-12 NOTE — Discharge Instructions (Addendum)
You were evaluated in the Emergency Department and after careful evaluation, we did not find any emergent condition requiring admission or further testing in the hospital.  Your exam/testing today was overall reassuring.  It is very important that you avoid illicit drugs in the future.  Please return to the Emergency Department if you experience any worsening of your condition.  Thank you for allowing [place] to be a part of your care.

## 2021-07-12 NOTE — ED Provider Notes (Signed)
[facility]-EMERGENCY DEPT [facility] Emergency Department Provider Note MRN:  [id number]  Arrival date & time: 07/12/21     Chief Complaint   Drug Overdose   History of Present Illness   Chris Powell is a 55 y.o. year-old male with a history of CHF, COPD, diabetes, CAD presenting to the ED with chief complaint of drug overdose.  Found unresponsive by EMS at home, given intranasal and IV Narcan with significant improvement, back to baseline.  Patient admits to smoking crack this evening.  Currently denying any pain or symptoms, does not really know what happened.  Patient intermittently falling asleep during the history taking.  I was unable to obtain an accurate HPI, PMH, or ROS due to the patient's altered mental status.  Level 5 caveat.  Review of Systems  Positive for altered mental status.  Patient's Health History    Past Medical History:  Diagnosis Date   Anemia    Arthritis    Asthma    Bone spur of ankle    right    CHF (congestive heart failure) (HCC)    COPD (chronic obstructive pulmonary disease) (HCC)    Coronary artery disease    Depression    Diabetes (HCC)    borderline    GERD (gastroesophageal reflux disease)    Hyperlipidemia    Hypertension    Myocardial infarction (HCC)    Pneumonia    HX OF PNA   Right rotator cuff tear 01/06/2018   Shortness of breath dyspnea    Sleep apnea    USES CPAP    Past Surgical History:  Procedure Laterality Date   CARDIAC CATHETERIZATION  12/22/2014   CORONARY ANGIOPLASTY     CORONARY STENT INTERVENTION N/A 01/12/2019   Procedure: CORONARY STENT INTERVENTION;  Surgeon: Tonny Bollman, MD;  Location: [facility] INVASIVE CV LAB;  Service: Cardiovascular;  Laterality: N/A;   CORONARY STENT INTERVENTION N/A 06/26/2021   Procedure: CORONARY STENT INTERVENTION;  Surgeon: Marykay Lex, MD;  Location: [facility] INVASIVE CV LAB;  Service: Cardiovascular;  Laterality: N/A;   CORONARY STENT PLACEMENT  12/22/2014   LAD   KNEE SURGERY      LEFT HEART CATH N/A 01/12/2019   Procedure: Left Heart Cath;  Surgeon: Tonny Bollman, MD;  Location: [facility] INVASIVE CV LAB;  Service: Cardiovascular;  Laterality: N/A;   LEFT HEART CATH AND CORONARY ANGIOGRAPHY N/A 12/16/2017   Procedure: LEFT HEART CATH AND CORONARY ANGIOGRAPHY;  Surgeon: [place]azil[place], Peter M, MD;  Location: [facility] INVASIVE CV LAB;  Service: Cardiovascular;  Laterality: N/A;   LEFT HEART CATH AND CORONARY ANGIOGRAPHY N/A 06/26/2021   Procedure: LEFT HEART CATH AND CORONARY ANGIOGRAPHY;  Surgeon: Marykay Lex, MD;  Location: [facility] INVASIVE CV LAB;  Service: Cardiovascular;  Laterality: N/A;   LEFT HEART CATHETERIZATION WITH CORONARY ANGIOGRAM N/A 12/22/2014   Procedure: LEFT HEART CATHETERIZATION WITH CORONARY ANGIOGRAM;  Surgeon: Corky Crafts, MD;  Location: [facility] CATH LAB;  Service: Cardiovascular;  Laterality: N/A;   RIGHT/LEFT HEART CATH AND CORONARY ANGIOGRAPHY N/A 01/08/2019   Procedure: RIGHT/LEFT HEART CATH AND CORONARY ANGIOGRAPHY;  Surgeon: Dolores Patty, MD;  Location: [facility] INVASIVE CV LAB;  Service: Cardiovascular;  Laterality: N/A;   SHOULDER ARTHROSCOPY WITH ROTATOR CUFF REPAIR AND SUBACROMIAL DECOMPRESSION Right 01/06/2018   Procedure: RIGHT SHOULDER ARTHROSCOPY  DEBRIDEMENT,ACROMIOPLASTY, ROTATOR CUFF REPAIR;  Surgeon: Teryl Lucy, MD;  Location: [facility] OR;  Service: Orthopedics;  Laterality: Right;    Family History  Problem Relation Age of Onset   Heart attack Father  Diabetes Father    Cancer Mother        leukemia   Hypertension Mother    Hyperlipidemia Mother    Cancer Sister    Colon cancer Neg Hx     Social History   Socioeconomic History   Marital status: Legally Separated    Spouse name: Not on file   Number of children: Not on file   Years of education: Not on file   Highest education level: Not on file  Occupational History   Not on file  Tobacco Use   Smoking status: Every Day    Packs/day: 1.00    Years: 30.00    Pack years: 30.00     Types: Cigarettes   Smokeless tobacco: Never   Tobacco comments:    " I have  tried vapor cigarettes"  Vaping Use   Vaping Use: Former  Substance and Sexual Activity   Alcohol use: Yes    Alcohol/week: 0.0 standard drinks    Comment: occ beer    Drug use: Yes    Types: Cocaine   Sexual activity: Not on file  Other Topics Concern   Not on file  Social History Narrative   Not on file   Social Determinants of Health   Financial Resource Strain: Not on file  Food Insecurity: Not on file  Transportation Needs: Not on file  Physical Activity: Not on file  Stress: Not on file  Social Connections: Not on file  Intimate Partner Violence: Not on file     Physical Exam   Vitals:   07/12/21 0530 07/12/21 0600  BP: (!) 138/91 138/82  Pulse: 61 67  Resp: 15 15  Temp:    SpO2: 99% 94%    CONSTITUTIONAL: Well-appearing, NAD NEURO: Somnolent but wakes to voice, fully oriented, moving all extremities EYES:  eyes equal and reactive ENT/NECK:  no LAD, no JVD CARDIO: Regular rate, well-perfused, normal S1 and S2 PULM:  CTAB no wheezing or rhonchi GI/GU:  normal bowel sounds, non-distended, non-tender MSK/SPINE:  No gross deformities, no edema SKIN:  no rash, atraumatic PSYCH:  Appropriate speech and behavior  *Additional and/or pertinent findings included in MDM below  Diagnostic and Interventional Summary    EKG Interpretation  Date/Time:  Thursday July 12 2021 00:51:05 EDT Ventricular Rate:  68 PR Interval:  120 QRS Duration: 106 QT Interval:  383 QTC Calculation: 408 R Axis:   66 Text Interpretation: Sinus rhythm Nonspecific T abnormalities, lateral leads No significant change was found Confirmed by Kennis Carina [phone]) on 07/12/2021 1:14:54 AM       Labs Reviewed  CBC - Abnormal; Notable for the following components:      Result Value   WBC 11.5 (*)    All other components within normal limits  COMPREHENSIVE METABOLIC PANEL - Abnormal; Notable for the  following components:   Potassium 3.3 (*)    Glucose, Bld 126 (*)    Creatinine, Ser 1.43 (*)    GFR, Estimated 58 (*)    All other components within normal limits  RAPID URINE DRUG SCREEN, HOSP PERFORMED - Abnormal; Notable for the following components:   Cocaine POSITIVE (*)    All other components within normal limits  CBG MONITORING, ED - Abnormal; Notable for the following components:   Glucose-Capillary 174 (*)    All other components within normal limits  ETHANOL    No orders to display    Medications  naloxone (NARCAN) injection 0.4 mg (0.4 mg Intravenous Given 07/12/21  5597)     Procedures  /  Critical Care .Critical Care Performed by: Sabas Sous, MD Authorized by: Sabas Sous, MD   Critical care provider statement:    Critical care time (minutes):  35   Critical care time was exclusive of:  Separately billable procedures and treating other patients   Critical care was necessary to treat or prevent imminent or life-threatening deterioration of the following conditions:  Toxidrome   Critical care was time spent personally by me on the following activities:  Review of old charts, re-evaluation of patient's condition, pulse oximetry, ordering and review of radiographic studies, ordering and review of laboratory studies, ordering and performing treatments and interventions, evaluation of patient's response to treatment and examination of patient  ED Course and Medical Decision Making  I have reviewed the triage vital signs, the nursing notes, and pertinent available records from the EMR.  Listed above are laboratory and imaging tests that I personally ordered, reviewed, and interpreted and then considered in my medical decision making (see below for details).  Opioid toxidrome, suspect patient's crack cocaine was laced with opioid such as fentanyl.  Good recovery with EMS, not complaining of any symptoms at this time but is quick to fall back asleep.  Covering at  about 90% O2 saturation on room air, intermittent brief apneic spells, still averaging 12-15 respirations per minute.  Given an additional dose of Narcan here in the emergency department, patient does easily wake, currently on 2 L nasal cannula, will monitor for any need for further Narcan.     Patient observed for a total of 6 hours, seem to be cleared of the opioid toxidrome at the 4-hour mark, allowed to stay a bit longer so that he could obtain transportation.  Continues to look and feel well with normal vital signs, labs reassuring, appropriate for discharge.  Elmer Sow. Pilar Plate, MD [facility] Emergency Medicine [facility] [email or web address]  Final Clinical Impressions(s) / ED Diagnoses     ICD-10-CM   1. Opiate overdose, accidental or unintentional, initial encounter [facility])  T40.601A       ED Discharge Orders     None        Discharge Instructions Discussed with and Provided to Patient:     Discharge Instructions      You were evaluated in the Emergency Department and after careful evaluation, we did not find any emergent condition requiring admission or further testing in the hospital.  Your exam/testing today was overall reassuring.  It is very important that you avoid illicit drugs in the future.  Please return to the Emergency Department if you experience any worsening of your condition.  Thank you for allowing [place] to be a part of your care.         Sabas Sous, MD 07/12/21 [phone]

## 2021-07-12 NOTE — Telephone Encounter (Signed)
Transitions of Care Pharmacy   Call attempted for a pharmacy transitions of care follow-up. Unable to leave voicemail.   Call attempt #3. Will no longer follow up for TOC pharmacy   

## 2021-07-22 ENCOUNTER — Encounter: Payer: Self-pay | Admitting: Cardiovascular Disease

## 2021-07-22 NOTE — Progress Notes (Signed)
Cardiology Office Note   Date:  07/23/2021   ID:  Chris Powell, DOB 1966/01/01, MRN [id number]  PCP:  Patient, No Pcp Per (Inactive)  Cardiologist:  Leodis Sias, M.D.  Chief Complaint: High blood pressure  Problem list: 1. Coronary artery disease-status post PTCA and stenting 2. Hypertension 3. COPD 4. Obstructive sleep apnea 5. Cocaine abuse    History of Present Illness: Chris Powell is a 55 y.o. male who presents for post hospital follow-up. He was admitted to the hospital with NSTEMI 12/21/14 treated with drug-eluting stent to the LAD. Echo showed moderate LVH EF 55-65%. He had hypokalemia that was treated. He also has history of COPD, hypertension, obstructive sleep apnea on CPap.  Patient comes in today for follow-up. His blood pressure is elevated. He thinks it's related to allergies. He is doing cardiac rehabilitation at [facility] and has lost 9 pounds. He is having trouble quitting smoking. He is down to half a pack a day but it is very hard for him. He is trying to use nicotine patches. Overall he feels so much better. He denies chest pain, palpitations, dyspnea, dyspnea on exertion, dizziness or presyncope.  Jan. 19, 2017:  Doing well.  Is not sleeping well at night. Has to urinate all night  - 3-5 times a night.    April 10, 2016:  BP is up today  Taking his meds Still eating high salt foods on occasion . ( ham, hot dogs, )  Is trying to lose weight .  Gets only a little exercise.     Does not work - on [employer] ,  Mows his grass.   Oct. 31, 2022: Chris Powell is seen today  I last saw him > 5 years ago Had HTN,  was eating poorly  On disability  Wt is 368 lbs   Had more angina , Was admitted, had coronary stenting  Is feeling better  Is having lots of arthritis pain  Left Rotator cuff issues   Still smoking  Still using cocaine  Is on ASA 81 QD + Brilinta  ( received from hospital prior to DC ) Has not picked up prescriptions yet - still not  on atorvastatin    Lipid levels performed during the hospitalization reveals a total cholesterol of 188.  HDL is 35.  LDL is 128.  Triglycerides are 125.   Past Medical History:  Diagnosis Date   Anemia    Arthritis    Asthma    Bone spur of ankle    right    CHF (congestive heart failure) (HCC)    COPD (chronic obstructive pulmonary disease) (HCC)    Coronary artery disease    Depression    Diabetes (HCC)    borderline    GERD (gastroesophageal reflux disease)    Hyperlipidemia    Hypertension    Myocardial infarction (HCC)    Pneumonia    HX OF PNA   Right rotator cuff tear 01/06/2018   Shortness of breath dyspnea    Sleep apnea    USES CPAP    Past Surgical History:  Procedure Laterality Date   CARDIAC CATHETERIZATION  12/22/2014   CORONARY ANGIOPLASTY     CORONARY STENT INTERVENTION N/A 01/12/2019   Procedure: CORONARY STENT INTERVENTION;  Surgeon: Tonny Bollman, MD;  Location: [facility] INVASIVE CV LAB;  Service: Cardiovascular;  Laterality: N/A;   CORONARY STENT INTERVENTION N/A 06/26/2021   Procedure: CORONARY STENT INTERVENTION;  Surgeon: Marykay Lex, MD;  Location: [facility] INVASIVE CV  LAB;  Service: Cardiovascular;  Laterality: N/A;   CORONARY STENT PLACEMENT  12/22/2014   LAD   KNEE SURGERY     LEFT HEART CATH N/A 01/12/2019   Procedure: Left Heart Cath;  Surgeon: Tonny Bollman, MD;  Location: [facility] INVASIVE CV LAB;  Service: Cardiovascular;  Laterality: N/A;   LEFT HEART CATH AND CORONARY ANGIOGRAPHY N/A 12/16/2017   Procedure: LEFT HEART CATH AND CORONARY ANGIOGRAPHY;  Surgeon: Swaziland, Peter M, MD;  Location: [facility] INVASIVE CV LAB;  Service: Cardiovascular;  Laterality: N/A;   LEFT HEART CATH AND CORONARY ANGIOGRAPHY N/A 06/26/2021   Procedure: LEFT HEART CATH AND CORONARY ANGIOGRAPHY;  Surgeon: Marykay Lex, MD;  Location: [facility] INVASIVE CV LAB;  Service: Cardiovascular;  Laterality: N/A;   LEFT HEART CATHETERIZATION WITH CORONARY ANGIOGRAM N/A 12/22/2014   Procedure:  LEFT HEART CATHETERIZATION WITH CORONARY ANGIOGRAM;  Surgeon: Corky Crafts, MD;  Location: [facility] CATH LAB;  Service: Cardiovascular;  Laterality: N/A;   RIGHT/LEFT HEART CATH AND CORONARY ANGIOGRAPHY N/A 01/08/2019   Procedure: RIGHT/LEFT HEART CATH AND CORONARY ANGIOGRAPHY;  Surgeon: Dolores Patty, MD;  Location: [facility] INVASIVE CV LAB;  Service: Cardiovascular;  Laterality: N/A;   SHOULDER ARTHROSCOPY WITH ROTATOR CUFF REPAIR AND SUBACROMIAL DECOMPRESSION Right 01/06/2018   Procedure: RIGHT SHOULDER ARTHROSCOPY  DEBRIDEMENT,ACROMIOPLASTY, ROTATOR CUFF REPAIR;  Surgeon: Teryl Lucy, MD;  Location: [facility] OR;  Service: Orthopedics;  Laterality: Right;     Current Outpatient Medications  Medication Sig Dispense Refill   acetaminophen (TYLENOL) 500 MG tablet Take 500 mg by mouth every 6 (six) hours as needed for moderate pain.     albuterol (VENTOLIN HFA) 108 (90 Base) MCG/ACT inhaler Inhale 2 puffs into the lungs every 6 (six) hours as needed for wheezing or shortness of breath. 6.7 g 2   amLODipine (NORVASC) 10 MG tablet Take 1 tablet (10 mg total) by mouth daily. 30 tablet 11   aspirin 81 MG EC tablet Take 1 tablet (81 mg total) by mouth daily. Swallow whole. 90 tablet 3   atorvastatin (LIPITOR) 80 MG tablet Take 1 tablet (80 mg total) by mouth daily at 6 PM. 30 tablet 11   carvedilol (COREG) 25 MG tablet Take 1 tablet (25 mg total) by mouth 2 (two) times daily with a meal. 60 tablet 6   Cholecalciferol 2000 UNITS CAPS Take 2,000 Units by mouth daily.      cloNIDine (CATAPRES) 0.3 MG tablet Take 1 tablet (0.3 mg total) by mouth 2 (two) times daily. 60 tablet 6   empagliflozin (JARDIANCE) 10 MG TABS tablet Take 10 mg by mouth daily.     furosemide (LASIX) 40 MG tablet Take 1 tablet (40 mg total) by mouth daily. 30 tablet 6   gabapentin (NEURONTIN) 600 MG tablet Take 1 tablet (600 mg total) by mouth 3 (three) times daily. 90 tablet 0   hydrALAZINE (APRESOLINE) 100 MG tablet Take 1 tablet (100  mg total) by mouth 3 (three) times daily. 90 tablet 6   isosorbide mononitrate (IMDUR) 60 MG 24 hr tablet Take 1 tablet (60 mg total) by mouth daily. 30 tablet 6   lidocaine (LIDODERM) 5 % Place 1 patch onto the skin daily. Remove & Discard patch within 12 hours or as directed by MD 30 patch 0   naphazoline-pheniramine (VISINE) 0.025-0.3 % ophthalmic solution Place 1 drop into both eyes 4 (four) times daily as needed for eye irritation.     nitroGLYCERIN (NITROSTAT) 0.4 MG SL tablet Place 1 tablet (0.4 mg total) under  the tongue every 5 (five) minutes x 3 doses as needed for chest pain. 25 tablet 3   omeprazole (PRILOSEC) 20 MG capsule Take 1 capsule (20 mg total) by mouth daily before breakfast. 30 capsule 0   ticagrelor (BRILINTA) 90 MG TABS tablet Take 1 tablet (90 mg total) by mouth 2 (two) times daily. 60 tablet 3   Tirzepatide (MOUNJARO Keys) Inject into the skin.     No current facility-administered medications for this visit.    Allergies:   Patient has no known allergies.    Social History:  The patient  reports that he has been smoking cigarettes. He has a 30.00 pack-year smoking history. He has never used smokeless tobacco. He reports current alcohol use. He reports current drug use. Drug: Cocaine.   Family History:  The patient's family history includes Cancer in his mother and sister; Diabetes in his father; Heart attack in his father; Hyperlipidemia in his mother; Hypertension in his mother.    ROS:  Please see the history of present illness.   Otherwise, review of systems are positive for none.   All other systems are reviewed and negative.    Physical Exam: Blood pressure (!) 148/86, pulse 83, height 5\' 8"  (1.727 m), weight (!) 368 lb 6.4 oz (167.1 kg), SpO2 98 %.  GEN:  morbidly obese male,  HEENT: Normal NECK: No JVD; No carotid bruits LYMPHATICS: No lymphadenopathy CARDIAC: RRR , no murmurs, rubs, gallops RESPIRATORY:  Clear to auscultation without rales, wheezing or  rhonchi  ABDOMEN: Soft, non-tender, non-distended MUSCULOSKELETAL:  No edema; No deformity  SKIN: Warm and dry NEUROLOGIC:  Alert and oriented x 3   EKG:     Recent Labs: 06/24/2021: B Natriuretic Peptide 35.6 06/29/2021: Magnesium 2.4 07/12/2021: ALT 21; BUN 19; Creatinine, Ser 1.43; Hemoglobin 14.8; Platelets 278; Potassium 3.3; Sodium 140    Lipid Panel    Component Value Date/Time   CHOL 188 06/24/2021 0422   CHOL 117 12/09/2017 0812   TRIG 125 06/24/2021 0422   HDL 35 (L) 06/24/2021 0422   HDL 35 (L) 12/09/2017 0812   CHOLHDL 5.4 06/24/2021 0422   VLDL 25 06/24/2021 0422   LDLCALC 128 (H) 06/24/2021 0422   LDLCALC 67 12/09/2017 0812      Wt Readings from Last 3 Encounters:  07/23/21 (!) 368 lb 6.4 oz (167.1 kg)  07/12/21 (!) 369 lb 8 oz (167.6 kg)  07/11/21 (!) 369 lb 8 oz (167.6 kg)      Other studies Reviewed: Additional studies/ records that were reviewed today include and review of the records demonstrates:  2-D echo 12/23/14 Study Conclusions  - Left ventricle: The cavity size was normal. Wall thickness was   increased in a pattern of moderate LVH. Systolic function was   normal. The estimated ejection fraction was in the range of 55%   to 65%. - Left atrium: The atrium was moderately dilated.  Cardiac catheterization 12/22/14 IMPRESSIONS:    Widely patent  left main coronary artery. Severe disease in the mid  left anterior descending artery with moderate disease further distal. The 95% stenosis was treated with a 2.5 x 24 Synergy drug-eluting stent, postdilated with a 3.0 noncompliant balloon. Mild to moderate disease in the  left circumflex artery and its branches. Mild to moderate disease in the right coronary artery.  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 Left ventricular systolic function not assessed . Elevated LVEDP 26 mmHg.    Overall  difficult cardiac cath from the right radial approach due to difficulty  in locating the left main and RCA. He has a short ascending aorta.   Assessment and Plan    1. CAD: Langston Masker presents a month after his recent hospitalization for unstable angina and a non-ST segment elevation myocardial infarction.  He had stenting of the ramus intermediate vessel.  Unfortunately continues to smoke and continues to use cocaine.  He has not yet picked up his prescription from the pharmacy sincehis DC from the hospital at the beginning of this month  .  I strongly encouraged him to work on diet, exercise, risk factor modification including smoking cessation and avoiding cocaine.  Continue aspirin and Brilinta.  2.  Hyperlipidemia: We will check lipids, ALT, basic metabolic profile in 3 months.  I will have him see an APP in 1 year.   2. Essential HTN:   Bp is well contorlled    2. ED:          Signed, Kristeen Miss, MD  07/23/2021 4:18 PM    [facility] [address] [place], [place], [place]  [address] Phone: [phone]; Fax: [phone]

## 2021-07-23 ENCOUNTER — Other Ambulatory Visit: Payer: Self-pay

## 2021-07-23 ENCOUNTER — Ambulatory Visit (INDEPENDENT_AMBULATORY_CARE_PROVIDER_SITE_OTHER): Payer: Medicaid Other | Admitting: Cardiovascular Disease

## 2021-07-23 ENCOUNTER — Encounter: Payer: Self-pay | Admitting: Cardiovascular Disease

## 2021-07-23 VITALS — BP 148/86 | HR 83 | Ht 68.0 in | Wt 368.4 lb

## 2021-07-23 DIAGNOSIS — I2510 Atherosclerotic heart disease of native coronary artery without angina pectoris: Secondary | ICD-10-CM | POA: Diagnosis not present

## 2021-07-23 DIAGNOSIS — F1410 Cocaine abuse, uncomplicated: Secondary | ICD-10-CM | POA: Diagnosis not present

## 2021-07-23 DIAGNOSIS — Z9861 Coronary angioplasty status: Secondary | ICD-10-CM

## 2021-07-23 NOTE — Patient Instructions (Addendum)
Medication Instructions:  Your physician recommends that you continue on your current medications as directed. Please refer to the Current Medication list given to you today.  *If you need a refill on your cardiac medications before your next appointment, please call your pharmacy*   Lab Work: TO BE DONE IN THREE MONTHS: LIPIDS, ALT, BMP If you have labs (blood work) drawn today and your tests are completely normal, you will receive your results only by: MyChart Message (if you have MyChart) OR A paper copy in the mail If you have any lab test that is abnormal or we need to change your treatment, we will call you to review the results.   Testing/Procedures: NONE   Follow-Up: At [facility], you and your health needs are our priority.  As part of our continuing mission to provide you with exceptional heart care, we have created designated Provider Care Teams.  These Care Teams include your primary Cardiologist (physician) and Advanced Practice Providers (APPs -  Physician Assistants and Nurse Practitioners) who all work together to provide you with the care you need, when you need it.  We recommend signing up for the patient portal called "MyChart".  Sign up information is provided on this After Visit Summary.  MyChart is used to connect with patients for Virtual Visits (Telemedicine).  Patients are able to view lab/test results, encounter notes, upcoming appointments, etc.  Non-urgent messages can be sent to your provider as well.   To learn more about what you can do with MyChart, go to [email or web address].    Your next appointment:   1 year(s)  The format for your next appointment:   In Person  Provider:   You will see one of the following Advanced Practice Providers on your designated Care Team:   Tereso Newcomer, PA-C [place], [place]

## 2021-10-23 ENCOUNTER — Other Ambulatory Visit: Payer: Medicaid Other

## 2021-11-14 ENCOUNTER — Other Ambulatory Visit: Payer: Self-pay

## 2021-11-14 ENCOUNTER — Emergency Department (HOSPITAL_COMMUNITY): Payer: Medicaid Other

## 2021-11-14 ENCOUNTER — Inpatient Hospital Stay (HOSPITAL_COMMUNITY)
Admission: EM | Admit: 2021-11-14 | Discharge: 2021-11-16 | DRG: 308 | Disposition: A | Discharge status: Home / Self Care | Payer: Medicaid Other | Attending: Family Medicine | Admitting: Family Medicine

## 2021-11-14 DIAGNOSIS — I4819 Other persistent atrial fibrillation: Secondary | ICD-10-CM | POA: Diagnosis present

## 2021-11-14 DIAGNOSIS — E8809 Other disorders of plasma-protein metabolism, not elsewhere classified: Secondary | ICD-10-CM | POA: Diagnosis present

## 2021-11-14 DIAGNOSIS — I493 Ventricular premature depolarization: Secondary | ICD-10-CM | POA: Diagnosis present

## 2021-11-14 DIAGNOSIS — Z79899 Other long term (current) drug therapy: Secondary | ICD-10-CM

## 2021-11-14 DIAGNOSIS — I2510 Atherosclerotic heart disease of native coronary artery without angina pectoris: Secondary | ICD-10-CM | POA: Diagnosis present

## 2021-11-14 DIAGNOSIS — I480 Paroxysmal atrial fibrillation: Principal | ICD-10-CM | POA: Diagnosis present

## 2021-11-14 DIAGNOSIS — Z9989 Dependence on other enabling machines and devices: Secondary | ICD-10-CM

## 2021-11-14 DIAGNOSIS — E876 Hypokalemia: Secondary | ICD-10-CM | POA: Diagnosis present

## 2021-11-14 DIAGNOSIS — Z806 Family history of leukemia: Secondary | ICD-10-CM

## 2021-11-14 DIAGNOSIS — R778 Other specified abnormalities of plasma proteins: Secondary | ICD-10-CM | POA: Diagnosis present

## 2021-11-14 DIAGNOSIS — R079 Chest pain, unspecified: Secondary | ICD-10-CM | POA: Diagnosis not present

## 2021-11-14 DIAGNOSIS — Z833 Family history of diabetes mellitus: Secondary | ICD-10-CM

## 2021-11-14 DIAGNOSIS — E46 Unspecified protein-calorie malnutrition: Secondary | ICD-10-CM

## 2021-11-14 DIAGNOSIS — I10 Essential (primary) hypertension: Secondary | ICD-10-CM | POA: Diagnosis not present

## 2021-11-14 DIAGNOSIS — E1129 Type 2 diabetes mellitus with other diabetic kidney complication: Secondary | ICD-10-CM

## 2021-11-14 DIAGNOSIS — I252 Old myocardial infarction: Secondary | ICD-10-CM | POA: Diagnosis not present

## 2021-11-14 DIAGNOSIS — Z8249 Family history of ischemic heart disease and other diseases of the circulatory system: Secondary | ICD-10-CM | POA: Diagnosis not present

## 2021-11-14 DIAGNOSIS — I110 Hypertensive heart disease with heart failure: Secondary | ICD-10-CM | POA: Diagnosis present

## 2021-11-14 DIAGNOSIS — E1141 Type 2 diabetes mellitus with diabetic mononeuropathy: Secondary | ICD-10-CM | POA: Diagnosis not present

## 2021-11-14 DIAGNOSIS — E119 Type 2 diabetes mellitus without complications: Secondary | ICD-10-CM

## 2021-11-14 DIAGNOSIS — I248 Other forms of acute ischemic heart disease: Secondary | ICD-10-CM | POA: Diagnosis present

## 2021-11-14 DIAGNOSIS — Z6841 Body Mass Index (BMI) 40.0 and over, adult: Secondary | ICD-10-CM

## 2021-11-14 DIAGNOSIS — Z8701 Personal history of pneumonia (recurrent): Secondary | ICD-10-CM

## 2021-11-14 DIAGNOSIS — I4891 Unspecified atrial fibrillation: Secondary | ICD-10-CM | POA: Diagnosis not present

## 2021-11-14 DIAGNOSIS — Z8679 Personal history of other diseases of the circulatory system: Secondary | ICD-10-CM | POA: Diagnosis not present

## 2021-11-14 DIAGNOSIS — I429 Cardiomyopathy, unspecified: Secondary | ICD-10-CM | POA: Diagnosis present

## 2021-11-14 DIAGNOSIS — Z9861 Coronary angioplasty status: Secondary | ICD-10-CM

## 2021-11-14 DIAGNOSIS — E782 Mixed hyperlipidemia: Secondary | ICD-10-CM | POA: Diagnosis present

## 2021-11-14 DIAGNOSIS — Z20822 Contact with and (suspected) exposure to covid-19: Secondary | ICD-10-CM | POA: Diagnosis present

## 2021-11-14 DIAGNOSIS — I25119 Atherosclerotic heart disease of native coronary artery with unspecified angina pectoris: Secondary | ICD-10-CM | POA: Diagnosis not present

## 2021-11-14 DIAGNOSIS — E1140 Type 2 diabetes mellitus with diabetic neuropathy, unspecified: Secondary | ICD-10-CM | POA: Diagnosis present

## 2021-11-14 DIAGNOSIS — F17200 Nicotine dependence, unspecified, uncomplicated: Secondary | ICD-10-CM | POA: Diagnosis present

## 2021-11-14 DIAGNOSIS — Z7984 Long term (current) use of oral hypoglycemic drugs: Secondary | ICD-10-CM

## 2021-11-14 DIAGNOSIS — Z955 Presence of coronary angioplasty implant and graft: Secondary | ICD-10-CM

## 2021-11-14 DIAGNOSIS — G4733 Obstructive sleep apnea (adult) (pediatric): Secondary | ICD-10-CM | POA: Diagnosis present

## 2021-11-14 DIAGNOSIS — J449 Chronic obstructive pulmonary disease, unspecified: Secondary | ICD-10-CM | POA: Diagnosis present

## 2021-11-14 DIAGNOSIS — F1410 Cocaine abuse, uncomplicated: Secondary | ICD-10-CM | POA: Diagnosis present

## 2021-11-14 DIAGNOSIS — Z83438 Family history of other disorder of lipoprotein metabolism and other lipidemia: Secondary | ICD-10-CM

## 2021-11-14 DIAGNOSIS — Z7902 Long term (current) use of antithrombotics/antiplatelets: Secondary | ICD-10-CM

## 2021-11-14 DIAGNOSIS — F17210 Nicotine dependence, cigarettes, uncomplicated: Secondary | ICD-10-CM | POA: Diagnosis present

## 2021-11-14 DIAGNOSIS — J441 Chronic obstructive pulmonary disease with (acute) exacerbation: Secondary | ICD-10-CM

## 2021-11-14 DIAGNOSIS — F1490 Cocaine use, unspecified, uncomplicated: Secondary | ICD-10-CM | POA: Diagnosis not present

## 2021-11-14 DIAGNOSIS — I249 Acute ischemic heart disease, unspecified: Secondary | ICD-10-CM

## 2021-11-14 DIAGNOSIS — I5043 Acute on chronic combined systolic (congestive) and diastolic (congestive) heart failure: Secondary | ICD-10-CM | POA: Diagnosis present

## 2021-11-14 DIAGNOSIS — K219 Gastro-esophageal reflux disease without esophagitis: Secondary | ICD-10-CM | POA: Diagnosis not present

## 2021-11-14 DIAGNOSIS — Z7982 Long term (current) use of aspirin: Secondary | ICD-10-CM

## 2021-11-14 LAB — CBC WITH DIFFERENTIAL/PLATELET
Abs Immature Granulocytes: 0.04 10*3/uL (ref 0.00–0.07)
Basophils Absolute: 0.1 10*3/uL (ref 0.0–0.1)
Basophils Relative: 1 %
Eosinophils Absolute: 0.1 10*3/uL (ref 0.0–0.5)
Eosinophils Relative: 1 %
HCT: 45.5 % (ref 39.0–52.0)
Hemoglobin: 14.6 g/dL (ref 13.0–17.0)
Immature Granulocytes: 0 %
Lymphocytes Relative: 21 %
Lymphs Abs: 1.9 10*3/uL (ref 0.7–4.0)
MCH: 29.7 pg (ref 26.0–34.0)
MCHC: 32.1 g/dL (ref 30.0–36.0)
MCV: 92.5 fL (ref 80.0–100.0)
Monocytes Absolute: 0.8 10*3/uL (ref 0.1–1.0)
Monocytes Relative: 9 %
Neutro Abs: 6.2 10*3/uL (ref 1.7–7.7)
Neutrophils Relative %: 68 %
Platelets: 304 10*3/uL (ref 150–400)
RBC: 4.92 MIL/uL (ref 4.22–5.81)
RDW: 14.7 % (ref 11.5–15.5)
WBC: 9 10*3/uL (ref 4.0–10.5)
nRBC: 0 % (ref 0.0–0.2)

## 2021-11-14 LAB — RAPID URINE DRUG SCREEN, HOSP PERFORMED
Amphetamines: NOT DETECTED
Barbiturates: NOT DETECTED
Benzodiazepines: NOT DETECTED
Cocaine: POSITIVE — AB
Opiates: NOT DETECTED
Tetrahydrocannabinol: NOT DETECTED

## 2021-11-14 LAB — COMPREHENSIVE METABOLIC PANEL
ALT: 21 U/L (ref 0–44)
AST: 16 U/L (ref 15–41)
Albumin: 3.3 g/dL — ABNORMAL LOW (ref 3.5–5.0)
Alkaline Phosphatase: 89 U/L (ref 38–126)
Anion gap: 7 (ref 5–15)
BUN: 14 mg/dL (ref 6–20)
CO2: 25 mmol/L (ref 22–32)
Calcium: 8.9 mg/dL (ref 8.9–10.3)
Chloride: 108 mmol/L (ref 98–111)
Creatinine, Ser: 1.08 mg/dL (ref 0.61–1.24)
GFR, Estimated: >60 mL/min (ref >60)
Glucose, Bld: 120 mg/dL — ABNORMAL HIGH (ref 70–99)
Potassium: 3.2 mmol/L — ABNORMAL LOW (ref 3.5–5.1)
Sodium: 140 mmol/L (ref 135–145)
Total Bilirubin: 0.5 mg/dL (ref 0.3–1.2)
Total Protein: 7.2 g/dL (ref 6.5–8.1)

## 2021-11-14 LAB — URINALYSIS, ROUTINE W REFLEX MICROSCOPIC
Bilirubin Urine: NEGATIVE
Glucose, UA: NEGATIVE mg/dL
Hgb urine dipstick: NEGATIVE
Ketones, ur: NEGATIVE mg/dL
Leukocytes,Ua: NEGATIVE
Nitrite: NEGATIVE
Protein, ur: NEGATIVE mg/dL
Specific Gravity, Urine: 1.009 (ref 1.005–1.030)
pH: 5 (ref 5.0–8.0)

## 2021-11-14 LAB — RESP PANEL BY RT-PCR (FLU A&B, COVID) ARPGX2
Influenza A by PCR: NEGATIVE
Influenza B by PCR: NEGATIVE
SARS Coronavirus 2 by RT PCR: NEGATIVE

## 2021-11-14 LAB — CBG MONITORING, ED: Glucose-Capillary: 122 mg/dL — ABNORMAL HIGH (ref 70–99)

## 2021-11-14 LAB — TROPONIN I (HIGH SENSITIVITY)
Troponin I (High Sensitivity): 30 ng/L — ABNORMAL HIGH (ref <18)
Troponin I (High Sensitivity): 50 ng/L — ABNORMAL HIGH (ref <18)

## 2021-11-14 LAB — MAGNESIUM: Magnesium: 2.1 mg/dL (ref 1.7–2.4)

## 2021-11-14 LAB — BRAIN NATRIURETIC PEPTIDE: B Natriuretic Peptide: 85 pg/mL (ref 0.0–100.0)

## 2021-11-14 LAB — LIPASE, BLOOD: Lipase: 27 U/L (ref 11–51)

## 2021-11-14 MED ORDER — DILTIAZEM LOAD VIA INFUSION
15.0000 mg | Freq: Once | INTRAVENOUS | Status: AC
  Administered 2021-11-14: 15 mg via INTRAVENOUS
  Filled 2021-11-14: qty 15

## 2021-11-14 MED ORDER — PANTOPRAZOLE SODIUM 40 MG PO TBEC
40.0000 mg | DELAYED_RELEASE_TABLET | Freq: Every day | ORAL | Status: DC
  Administered 2021-11-15 – 2021-11-16 (×2): 40 mg via ORAL
  Filled 2021-11-14 (×2): qty 1

## 2021-11-14 MED ORDER — ASPIRIN 81 MG PO CHEW
162.0000 mg | CHEWABLE_TABLET | Freq: Once | ORAL | Status: AC
  Administered 2021-11-14: 162 mg via ORAL
  Filled 2021-11-14: qty 2

## 2021-11-14 MED ORDER — POTASSIUM CHLORIDE CRYS ER 20 MEQ PO TBCR
40.0000 meq | EXTENDED_RELEASE_TABLET | Freq: Once | ORAL | Status: AC
  Administered 2021-11-14: 40 meq via ORAL
  Filled 2021-11-14: qty 2

## 2021-11-14 MED ORDER — ATORVASTATIN CALCIUM 40 MG PO TABS
80.0000 mg | ORAL_TABLET | Freq: Every day | ORAL | Status: DC
  Administered 2021-11-15: 80 mg via ORAL
  Filled 2021-11-14: qty 2

## 2021-11-14 MED ORDER — GLUCERNA SHAKE PO LIQD
237.0000 mL | Freq: Three times a day (TID) | ORAL | Status: DC
  Administered 2021-11-15 – 2021-11-16 (×2): 237 mL via ORAL
  Filled 2021-11-14 (×2): qty 237

## 2021-11-14 MED ORDER — DILTIAZEM HCL-DEXTROSE 125-5 MG/125ML-% IV SOLN (PREMIX)
5.0000 mg/h | INTRAVENOUS | Status: DC
  Administered 2021-11-14: 5 mg/h via INTRAVENOUS
  Filled 2021-11-14 (×2): qty 125

## 2021-11-14 MED ORDER — INSULIN ASPART 100 UNIT/ML IJ SOLN: 0.0000 [IU] | Freq: Every day | INTRAMUSCULAR | Status: DC

## 2021-11-14 MED ORDER — CLOPIDOGREL BISULFATE 75 MG PO TABS
300.0000 mg | ORAL_TABLET | Freq: Once | ORAL | Status: AC
  Administered 2021-11-15: 300 mg via ORAL
  Filled 2021-11-14: qty 4

## 2021-11-14 MED ORDER — FUROSEMIDE 10 MG/ML IJ SOLN
40.0000 mg | Freq: Two times a day (BID) | INTRAMUSCULAR | Status: DC
  Administered 2021-11-15 – 2021-11-16 (×2): 40 mg via INTRAVENOUS
  Filled 2021-11-14 (×3): qty 4

## 2021-11-14 MED ORDER — NITROGLYCERIN 0.4 MG SL SUBL: 0.4000 mg | SUBLINGUAL_TABLET | SUBLINGUAL | Status: DC | PRN

## 2021-11-14 MED ORDER — POTASSIUM CHLORIDE CRYS ER 20 MEQ PO TBCR: 40.0000 meq | EXTENDED_RELEASE_TABLET | Freq: Once | ORAL | Status: DC

## 2021-11-14 MED ORDER — CLOPIDOGREL BISULFATE 75 MG PO TABS
75.0000 mg | ORAL_TABLET | Freq: Every day | ORAL | Status: DC
  Administered 2021-11-16: 75 mg via ORAL
  Filled 2021-11-14: qty 1

## 2021-11-14 MED ORDER — APIXABAN 5 MG PO TABS
5.0000 mg | ORAL_TABLET | Freq: Two times a day (BID) | ORAL | Status: DC
  Administered 2021-11-14 – 2021-11-16 (×4): 5 mg via ORAL
  Filled 2021-11-14 (×4): qty 1

## 2021-11-14 MED ORDER — INSULIN ASPART 100 UNIT/ML IJ SOLN
0.0000 [IU] | Freq: Three times a day (TID) | INTRAMUSCULAR | Status: DC
  Administered 2021-11-15: 4 [IU] via SUBCUTANEOUS
  Administered 2021-11-15 – 2021-11-16 (×3): 3 [IU] via SUBCUTANEOUS

## 2021-11-14 MED ORDER — GABAPENTIN 300 MG PO CAPS
600.0000 mg | ORAL_CAPSULE | Freq: Three times a day (TID) | ORAL | Status: DC
Start: 2021-11-14 — End: 2021-11-16
  Administered 2021-11-15 – 2021-11-16 (×5): 600 mg via ORAL
  Filled 2021-11-14 (×5): qty 2

## 2021-11-14 MED ORDER — FUROSEMIDE 10 MG/ML IJ SOLN
40.0000 mg | Freq: Once | INTRAMUSCULAR | Status: AC
  Administered 2021-11-14: 40 mg via INTRAVENOUS
  Filled 2021-11-14: qty 4

## 2021-11-14 NOTE — Assessment & Plan Note (Signed)
Continue statin per home regimen

## 2021-11-14 NOTE — Assessment & Plan Note (Signed)
Continue Protonix °

## 2021-11-14 NOTE — Assessment & Plan Note (Signed)
Albumin 3.3, protein supplement to be provided

## 2021-11-14 NOTE — Assessment & Plan Note (Signed)
Discontinue aspirin and ticagrelor Continue Plavix 300 mg tomorrow in the morning and then 75 mg daily per cardiology recommendation Continue statin

## 2021-11-14 NOTE — Assessment & Plan Note (Signed)
Hemoglobin A1c about 4 months ago was 6.6 Continue ISS and hypoglycemic protocol

## 2021-11-14 NOTE — Assessment & Plan Note (Signed)
Patient counseled on tobacco abuse cessation

## 2021-11-14 NOTE — Assessment & Plan Note (Addendum)
Troponin x 2-  30 > 50, this is possibly secondary to type II demand ischemia Patient is currently chest pain-free, continue to trend troponin

## 2021-11-14 NOTE — ED Triage Notes (Addendum)
Pt reports sudden onset chest pain around 4pm.  Describes as "felt like he swallowed an air bubble".  When chest pain did not resolve on its own, he took nitro x5 total.  Hx MI in past.  Per ems pt in afib on monitor, pt denies hx.  Upon arrival to ED chest pain resolved.  IV 20g left wrist.  Also took 2 aspirin at home PTA. Pt has lasix prescribed but does not take consistently

## 2021-11-14 NOTE — Assessment & Plan Note (Signed)
K+ is 3.2 °K+ will be replenished °Please monitor for AM K+ for further replenishmemnt ° °

## 2021-11-14 NOTE — ED Notes (Signed)
Cartizem started chest pain subsided. Hr is 130

## 2021-11-14 NOTE — Assessment & Plan Note (Signed)
Cardiovascular risk factors include hypertension, hyperlipidemia, obesity, type 2 diabetes mellitus, tobacco abuse/cocaine abuse Continue telemetry  Troponins x 1 - 30 EKG personally reviewed showed A-fib with RVR with nonspecific T wave abnormalities Cardiology will be consulted to help decide if Stress test is needed in am Versus other  diagnostic modalities.

## 2021-11-14 NOTE — ED Provider Notes (Signed)
[facility] EMERGENCY DEPARTMENT Provider Note   CSN: [id number] Arrival date & time: 11/14/21  1833     History  Chief Complaint  Patient presents with   Chest Pain    Chris Powell is a 56 y.o. male.   Chest Pain Patient presents for rapid heart rate.  He states that he did have an episode of chest pain earlier this afternoon.  He had someone at home checked his heart rate and it was elevated in the range of 100 to 150.  He also had some chest discomfort prior to arrival.  He took 5 nitroglycerin tablets at home.  He does not believe that the helped his discomfort.  He has had improved symptoms of chest discomfort prior to arrival in the ED.  On arrival in the ED, he denies any chest pain.  He denies palpitations.  He states that he is most concerned about his rapid heart rate.  He has never been told that he has atrial fibrillation in the past.  Per chart review, medical history includes obesity, HTN, ACS, CAD s/p stent in 2016, COPD, GERD, HLD, DM, CHF.  He is followed by [facility] (Dr. Elease Hashimoto).    Home Medications Prior to Admission medications   Medication Sig Start Date End Date Taking? Authorizing Provider  acetaminophen (TYLENOL) 500 MG tablet Take 500 mg by mouth every 6 (six) hours as needed for moderate pain.   Yes [provider]  albuterol (VENTOLIN HFA) 108 (90 Base) MCG/ACT inhaler Inhale 2 puffs into the lungs every 6 (six) hours as needed for wheezing or shortness of breath. 05/10/19  Yes Nahser, Deloris Ping, MD  amLODipine (NORVASC) 10 MG tablet Take 1 tablet (10 mg total) by mouth daily. 06/29/21  Yes Kroeger, Ovidio Kin., PA-C  aspirin 81 MG EC tablet Take 1 tablet (81 mg total) by mouth daily. Swallow whole. 06/30/21  Yes Kroeger, Dot Lanes M., PA-C  atorvastatin (LIPITOR) 80 MG tablet Take 1 tablet (80 mg total) by mouth daily at 6 PM. 06/29/21  Yes Kroeger, Dot Lanes M., PA-C  carvedilol (COREG) 25 MG tablet Take 1 tablet (25 mg total) by mouth 2 (two) times daily with a  meal. 06/29/21  Yes Kroeger, Dot Lanes M., PA-C  cloNIDine (CATAPRES) 0.3 MG tablet Take 1 tablet (0.3 mg total) by mouth 2 (two) times daily. 06/29/21  Yes Kroeger, Dot Lanes M., PA-C  empagliflozin (JARDIANCE) 10 MG TABS tablet Take 10 mg by mouth daily.   Yes [provider]  furosemide (LASIX) 40 MG tablet Take 1 tablet (40 mg total) by mouth daily. 06/29/21  Yes Kroeger, Dot Lanes M., PA-C  gabapentin (NEURONTIN) 600 MG tablet Take 1 tablet (600 mg total) by mouth 3 (three) times daily. 02/18/19  Yes Delynn Flavin M, DO  hydrALAZINE (APRESOLINE) 100 MG tablet Take 1 tablet (100 mg total) by mouth 3 (three) times daily. 06/29/21  Yes Kroeger, Dot Lanes M., PA-C  isosorbide mononitrate (IMDUR) 60 MG 24 hr tablet Take 1 tablet (60 mg total) by mouth daily. 06/30/21  Yes Kroeger, Dot Lanes M., PA-C  naphazoline-pheniramine (VISINE) 0.025-0.3 % ophthalmic solution Place 1 drop into both eyes 4 (four) times daily as needed for eye irritation.   Yes [provider]  nitroGLYCERIN (NITROSTAT) 0.4 MG SL tablet Place 1 tablet (0.4 mg total) under the tongue every 5 (five) minutes x 3 doses as needed for chest pain. 06/29/21  Yes Kroeger, Dot Lanes M., PA-C  omeprazole (PRILOSEC) 20 MG capsule Take 1 capsule (20 mg total) by  mouth daily before breakfast. 10/06/18  Yes Gherghe, Vella Redhead, MD  Semaglutide,0.25 or 0.5MG /DOS, (OZEMPIC, 0.25 OR 0.5 MG/DOSE,) 2 MG/1.5ML SOPN Inject 0.5 mg into the skin every 7 (seven) days. 09/07/21  Yes [provider]  ticagrelor (BRILINTA) 90 MG TABS tablet Take 1 tablet (90 mg total) by mouth 2 (two) times daily. 06/29/21  Yes Kroeger, Lorelee Cover., PA-C  Cholecalciferol 2000 UNITS CAPS Take 2,000 Units by mouth daily.     [provider]  lidocaine (LIDODERM) 5 % Place 1 patch onto the skin daily. Remove & Discard patch within 12 hours or as directed by MD Patient not taking: Reported on 11/14/2021 06/30/21   Abigail Butts., PA-C      Allergies    Patient has  no known allergies.    Review of Systems   Review of Systems  Cardiovascular:  Positive for chest pain.  All other systems reviewed and are negative.  Physical Exam Updated Vital Signs BP 122/71 (BP Location: Right Arm)    Pulse 84    Temp 98.4 F (36.9 C) (Oral)    Resp (!) 22    Ht 5\' 8"  (1.727 m)    Wt (!) 163.3 kg    SpO2 95%    BMI 54.74 kg/m  Physical Exam Vitals and nursing note reviewed.  Constitutional:      General: He is not in acute distress.    Appearance: He is well-developed. He is obese. He is not ill-appearing, toxic-appearing or diaphoretic.  HENT:     Head: Normocephalic and atraumatic.  Eyes:     Extraocular Movements: Extraocular movements intact.     Conjunctiva/sclera: Conjunctivae normal.  Neck:     Vascular: No JVD.  Cardiovascular:     Rate and Rhythm: Tachycardia present. Rhythm irregular.     Heart sounds: No murmur heard. Pulmonary:     Effort: Pulmonary effort is normal. No respiratory distress.     Breath sounds: Normal breath sounds. No decreased breath sounds, wheezing, rhonchi or rales.  Chest:     Chest wall: No mass or tenderness.  Abdominal:     Palpations: Abdomen is soft.     Tenderness: There is no abdominal tenderness.  Musculoskeletal:        General: No swelling.     Cervical back: Normal range of motion and neck supple.     Right lower leg: No edema.     Left lower leg: No edema.  Skin:    General: Skin is warm and dry.     Capillary Refill: Capillary refill takes less than 2 seconds.     Coloration: Skin is not cyanotic or pale.  Neurological:     General: No focal deficit present.     Mental Status: He is alert and oriented to person, place, and time.     Cranial Nerves: No cranial nerve deficit.     Motor: No weakness.  Psychiatric:        Mood and Affect: Mood normal.        Behavior: Behavior normal.    ED Results / Procedures / Treatments   Labs (all labs ordered are listed, but only abnormal results are  displayed) Labs Reviewed  COMPREHENSIVE METABOLIC PANEL - Abnormal; Notable for the following components:      Result Value   Potassium 3.2 (*)    Glucose, Bld 120 (*)    Albumin 3.3 (*)    All other components within normal limits  RAPID URINE DRUG  SCREEN, HOSP PERFORMED - Abnormal; Notable for the following components:   Cocaine POSITIVE (*)    All other components within normal limits  URINALYSIS, ROUTINE W REFLEX MICROSCOPIC - Abnormal; Notable for the following components:   Color, Urine STRAW (*)    All other components within normal limits  CBG MONITORING, ED - Abnormal; Notable for the following components:   Glucose-Capillary 122 (*)    All other components within normal limits  TROPONIN I (HIGH SENSITIVITY) - Abnormal; Notable for the following components:   Troponin I (High Sensitivity) 30 (*)    All other components within normal limits  TROPONIN I (HIGH SENSITIVITY) - Abnormal; Notable for the following components:   Troponin I (High Sensitivity) 50 (*)    All other components within normal limits  TROPONIN I (HIGH SENSITIVITY) - Abnormal; Notable for the following components:   Troponin I (High Sensitivity) 133 (*)    All other components within normal limits  RESP PANEL BY RT-PCR (FLU A&B, COVID) ARPGX2  MRSA NEXT GEN BY PCR, NASAL  MAGNESIUM  LIPASE, BLOOD  CBC WITH DIFFERENTIAL/PLATELET  BRAIN NATRIURETIC PEPTIDE  HEMOGLOBIN A1C  COMPREHENSIVE METABOLIC PANEL  CBC  APTT  MAGNESIUM  PHOSPHORUS  TROPONIN I (HIGH SENSITIVITY)    EKG EKG Interpretation  Date/Time:  Wednesday November 14 2021 18:48:38 EST Ventricular Rate:  109 PR Interval:    QRS Duration: 102 QT Interval:  322 QTC Calculation: 434 R Axis:   63 Text Interpretation: Atrial fibrillation Nonspecific T abnormalities, lateral leads Confirmed by Godfrey Pick [phone]) on 11/14/2021 7:27:35 PM  Radiology DG Chest Portable 1 View  Result Date: 11/14/2021 CLINICAL DATA:  Chest pain for several  hours EXAM: PORTABLE CHEST 1 VIEW COMPARISON:  06/23/2021 FINDINGS: Cardiac shadow is enlarged but accentuated by the portable technique. Lungs are well aerated bilaterally. Vascular congestion is noted without pulmonary edema. No focal infiltrate is seen. No bony abnormality is noted. IMPRESSION: Changes of mild CHF. Electronically Signed   By: Inez Catalina M.D.   On: 11/14/2021 19:33    Procedures Procedures    Medications Ordered in ED Medications  diltiazem (CARDIZEM) 1 mg/mL load via infusion 15 mg (15 mg Intravenous Bolus from Bag 11/14/21 1931)    And  diltiazem (CARDIZEM) 125 mg in dextrose 5% 125 mL (1 mg/mL) infusion (10 mg/hr Intravenous Rate/Dose Change 11/14/21 1945)  apixaban (ELIQUIS) tablet 5 mg (5 mg Oral Given 11/14/21 2105)  potassium chloride SA (KLOR-CON M) CR tablet 40 mEq (40 mEq Oral Not Given 11/14/21 2303)  feeding supplement (GLUCERNA SHAKE) (GLUCERNA SHAKE) liquid 237 mL (has no administration in time range)  clopidogrel (PLAVIX) tablet 300 mg (has no administration in time range)  clopidogrel (PLAVIX) tablet 75 mg (has no administration in time range)  furosemide (LASIX) injection 40 mg (has no administration in time range)  atorvastatin (LIPITOR) tablet 80 mg (has no administration in time range)  nitroGLYCERIN (NITROSTAT) SL tablet 0.4 mg (has no administration in time range)  pantoprazole (PROTONIX) EC tablet 40 mg (has no administration in time range)  gabapentin (NEURONTIN) capsule 600 mg (600 mg Oral Given 11/15/21 0028)  insulin aspart (novoLOG) injection 0-20 Units (has no administration in time range)  insulin aspart (novoLOG) injection 0-5 Units (0 Units Subcutaneous Not Given 11/14/21 2338)  aspirin chewable tablet 162 mg (162 mg Oral Given 11/14/21 1912)  potassium chloride SA (KLOR-CON M) CR tablet 40 mEq (40 mEq Oral Given 11/14/21 2031)  furosemide (LASIX) injection 40 mg (40 mg Intravenous  Given 11/14/21 2102)  ibuprofen (ADVIL) tablet 400 mg (400 mg Oral  Given 11/15/21 0028)    ED Course/ Medical Decision Making/ A&P                           Medical Decision Making Amount and/or Complexity of Data Reviewed Labs: ordered. Radiology: ordered.  Risk OTC drugs. Prescription drug management. Decision regarding hospitalization.   This patient presents to the ED for concern of rapid heart rate, this involves an extensive number of treatment options, and is a complaint that carries with it a high risk of complications and morbidity.  The differential diagnosis includes infection, electrolyte abnormalities, atrial fibrillation, SVT, sinus tachycardia, WPW   Co morbidities that complicate the patient evaluation  obesity, HTN, ACS, CAD s/p stent in 2016, COPD, GERD, HLD, DM, CHF   Additional history obtained:  Additional history obtained from N/A External records from outside source obtained and reviewed including EMR   Lab Tests:  I Ordered, and personally interpreted labs.  The pertinent results include: Hypokalemia   Imaging Studies ordered:  I ordered imaging studies including chest x-ray I independently visualized and interpreted imaging which showed mild cardiomegaly and vascular congestion I agree with the radiologist interpretation   Cardiac Monitoring:  The patient was maintained on a cardiac monitor.  I personally viewed and interpreted the cardiac monitored which showed an underlying rhythm of: Atrial fibrillation with RVR   Medicines ordered and prescription drug management:  I ordered medication including diltiazem for rate control, potassium chloride for hypokalemia, Lasix for diuresis Reevaluation of the patient after these medicines showed that the patient improved I have reviewed the patients home medicines and have made adjustments as needed  Critical Interventions:  Recognition of atrial fibrillation with RVR, initiation of diltiazem for rate control, consultation with cardiology, potassium replacement  for hypokalemia   Consultations Obtained:  I requested consultation with the cardiologist,  and discussed lab and imaging findings as well as pertinent plan - they recommend: Continued rate control; discontinuing aspirin and Brilinta.  Initiating Plavix with a 300 mg load tomorrow followed by 75 mg daily.  Initiating Eliquis now.   Problem List / ED Course:  Patient is a 57 year old male who presents for new onset atrial fibrillation.  He identified a rapid heart rate at home.  On arrival in the ED, EKG confirms A-fib with RVR.  Patient at this time is maintaining normal blood pressures.  He denies any current symptoms.  Patient was given 15 mg bolus of diltiazem and started on diltiazem gtt.  Following this intervention, he did have improved heart rate, from 140 down to 110.  Laboratory work-up was notable for hypokalemia.  Potassium replacement was provided.  Initial troponin was mildly elevated at 30, consistent with prior lab values.  I did speak with cardiology to inform them that patient was in the ED and plans for admission.  I also requested guidance on management of anticoagulation/antiplatelet medications.  Currently, patient is taking aspirin and Brilinta.  Cardiology recommended discontinuing both of these medications and starting him on Plavix and Eliquis.  Patient to receive 300 mg Plavix loading dose tomorrow, followed by 75 mg daily.  Patient was given his first dose of Eliquis in the ED with plans for twice daily dosing.  Patient remained hemodynamically stable.  He was admitted to the hospitalist for further management.   Reevaluation:  After the interventions noted above, I reevaluated the patient and found that  they have :improved   Social Determinants of Health:  History of substance abuse   Dispostion:  After consideration of the diagnostic results and the patients response to treatment, I feel that the patent would benefit from admission.  CRITICAL CARE Performed  by: Godfrey Pick   Total critical care time: 33 minutes  Critical care time was exclusive of separately billable procedures and treating other patients.  Critical care was necessary to treat or prevent imminent or life-threatening deterioration.  Critical care was time spent personally by me on the following activities: development of treatment plan with patient and/or surrogate as well as nursing, discussions with consultants, evaluation of patient's response to treatment, examination of patient, obtaining history from patient or surrogate, ordering and performing treatments and interventions, ordering and review of laboratory studies, ordering and review of radiographic studies, pulse oximetry and re-evaluation of patient's condition.           Final Clinical Impression(s) / ED Diagnoses Final diagnoses:  Atrial fibrillation with RVR ([place])  Hypokalemia    Rx / DC Orders ED Discharge Orders          Ordered    Amb referral to [facility]        11/14/21 1914              Godfrey Pick, MD 11/15/21 [phone]

## 2021-11-14 NOTE — H&P (Signed)
History and Physical    Patient: Chris Powell WNU:[id number] DOB: [birth year] DOA: 11/14/2021 DOS: the patient was seen and examined on 11/14/2021 PCP: Patient, No Pcp Per (Inactive)  Patient coming from: Home  Chief Complaint:  Chief Complaint  Patient presents with   Chest Pain    HPI: Chris Powell is a 56 y.o. male with medical history significant of chronic diastolic CHF, hypertension, CAD s/p stent placement, OSA on CPAP, hyperlipidemia, GERD, COPD, CHF, T2DM, obesity who presents to the emergency department via EMS due to chest pain which started this afternoon around 4 PM while at rest, chest pain was midsternal, nonradiating, sharp and rated as 8/10 on pain scale.  Patient states that he took 3 nitroglycerin sublingual with only slight improvement in the chest pain, he took extra 2 pills of nitroglycerin and the chest pain resolved.  Patient states that he also took 2 baby aspirin tablets at home.  He denies fever, chills, diaphoresis, shortness of breath, headache, nausea, vomiting, abdominal pain  ED course: In the emergency department, he was tachypneic, tachycardic, BP was 120/94, other vital signs were within normal range.  Work-up in the ED showed normal CBC, normal BMP except for hypokalemia, magnesium 2.1, lipase 27, BNP 85, urine drug screen was positive for cocaine, urinalysis was normal, troponin x1 -30 Chest x-ray showed changes of mild CHF Aspirin 162 mg was given, potassium was replenished, IV Lasix 40 mg x 1 was given.  Patient was started on IV diltiazem drip due to A-fib with RVR.  Cardiology Okey Dupre, Lorin Picket W) was consulted and recommended stopping patient's aspirin and ticagrelor and to start Eliquis and Plavix.  Hospitalist was asked to admit patient for further evaluation and management.  Review of Systems: As mentioned in the history of present illness. All other systems reviewed and are negative. Past Medical History:  Diagnosis Date   Anemia    Arthritis     Asthma    Bone spur of ankle    right    CHF (congestive heart failure) (HCC)    COPD (chronic obstructive pulmonary disease) (HCC)    Coronary artery disease    Depression    Diabetes (HCC)    borderline    GERD (gastroesophageal reflux disease)    Hyperlipidemia    Hypertension    Myocardial infarction (HCC)    Pneumonia    HX OF PNA   Right rotator cuff tear 01/06/2018   Shortness of breath dyspnea    Sleep apnea    USES CPAP   Past Surgical History:  Procedure Laterality Date   CARDIAC CATHETERIZATION  12/22/2014   CORONARY ANGIOPLASTY     CORONARY STENT INTERVENTION N/A 01/12/2019   Procedure: CORONARY STENT INTERVENTION;  Surgeon: Tonny Bollman, MD;  Location: [facility] INVASIVE CV LAB;  Service: Cardiovascular;  Laterality: N/A;   CORONARY STENT INTERVENTION N/A 06/26/2021   Procedure: CORONARY STENT INTERVENTION;  Surgeon: Marykay Lex, MD;  Location: [facility] INVASIVE CV LAB;  Service: Cardiovascular;  Laterality: N/A;   CORONARY STENT PLACEMENT  12/22/2014   LAD   KNEE SURGERY     LEFT HEART CATH N/A 01/12/2019   Procedure: Left Heart Cath;  Surgeon: Tonny Bollman, MD;  Location: [facility] INVASIVE CV LAB;  Service: Cardiovascular;  Laterality: N/A;   LEFT HEART CATH AND CORONARY ANGIOGRAPHY N/A 12/16/2017   Procedure: LEFT HEART CATH AND CORONARY ANGIOGRAPHY;  Surgeon: Swaziland, Peter M, MD;  Location: [facility] INVASIVE CV LAB;  Service: Cardiovascular;  Laterality: N/A;  LEFT HEART CATH AND CORONARY ANGIOGRAPHY N/A 06/26/2021   Procedure: LEFT HEART CATH AND CORONARY ANGIOGRAPHY;  Surgeon: Leonie Man, MD;  Location: [place] CV LAB;  Service: Cardiovascular;  Laterality: N/A;   LEFT HEART CATHETERIZATION WITH CORONARY ANGIOGRAM N/A 12/22/2014   Procedure: LEFT HEART CATHETERIZATION WITH CORONARY ANGIOGRAM;  Surgeon: Jettie Booze, MD;  Location: [facility] CATH LAB;  Service: Cardiovascular;  Laterality: N/A;   RIGHT/LEFT HEART CATH AND CORONARY ANGIOGRAPHY N/A 01/08/2019   Procedure:  RIGHT/LEFT HEART CATH AND CORONARY ANGIOGRAPHY;  Surgeon: Jolaine Artist, MD;  Location: [place] CV LAB;  Service: Cardiovascular;  Laterality: N/A;   SHOULDER ARTHROSCOPY WITH ROTATOR CUFF REPAIR AND SUBACROMIAL DECOMPRESSION Right 01/06/2018   Procedure: RIGHT SHOULDER ARTHROSCOPY  DEBRIDEMENT,ACROMIOPLASTY, ROTATOR CUFF REPAIR;  Surgeon: Marchia Bond, MD;  Location: [place];  Service: Orthopedics;  Laterality: Right;   Social History:  reports that he has been smoking cigarettes. He has a 30.00 pack-year smoking history. He has never used smokeless tobacco. He reports current alcohol use. He reports current drug use. Drug: Cocaine.  No Known Allergies  Family History  Problem Relation Age of Onset   Heart attack Father    Diabetes Father    Cancer Mother        leukemia   Hypertension Mother    Hyperlipidemia Mother    Cancer Sister    Colon cancer Neg Hx     Prior to Admission medications   Medication Sig Start Date End Date Taking? Authorizing Provider  acetaminophen (TYLENOL) 500 MG tablet Take 500 mg by mouth every 6 (six) hours as needed for moderate pain.   Yes [provider]  albuterol (VENTOLIN HFA) 108 (90 Base) MCG/ACT inhaler Inhale 2 puffs into the lungs every 6 (six) hours as needed for wheezing or shortness of breath. 05/10/19  Yes Nahser, Wonda Cheng, MD  amLODipine (NORVASC) 10 MG tablet Take 1 tablet (10 mg total) by mouth daily. 06/29/21  Yes Kroeger, Lorelee Cover., PA-C  aspirin 81 MG EC tablet Take 1 tablet (81 mg total) by mouth daily. Swallow whole. 06/30/21  Yes Kroeger, Daleen Snook M., PA-C  atorvastatin (LIPITOR) 80 MG tablet Take 1 tablet (80 mg total) by mouth daily at 6 PM. 06/29/21  Yes Kroeger, Daleen Snook M., PA-C  carvedilol (COREG) 25 MG tablet Take 1 tablet (25 mg total) by mouth 2 (two) times daily with a meal. 06/29/21  Yes Kroeger, Daleen Snook M., PA-C  cloNIDine (CATAPRES) 0.3 MG tablet Take 1 tablet (0.3 mg total) by mouth 2 (two) times daily. 06/29/21  Yes  Kroeger, Daleen Snook M., PA-C  empagliflozin (JARDIANCE) 10 MG TABS tablet Take 10 mg by mouth daily.   Yes [provider]  furosemide (LASIX) 40 MG tablet Take 1 tablet (40 mg total) by mouth daily. 06/29/21  Yes Kroeger, Daleen Snook M., PA-C  gabapentin (NEURONTIN) 600 MG tablet Take 1 tablet (600 mg total) by mouth 3 (three) times daily. 02/18/19  Yes Ronnie Doss M, DO  hydrALAZINE (APRESOLINE) 100 MG tablet Take 1 tablet (100 mg total) by mouth 3 (three) times daily. 06/29/21  Yes Kroeger, Daleen Snook M., PA-C  isosorbide mononitrate (IMDUR) 60 MG 24 hr tablet Take 1 tablet (60 mg total) by mouth daily. 06/30/21  Yes Kroeger, Daleen Snook M., PA-C  naphazoline-pheniramine (VISINE) 0.025-0.3 % ophthalmic solution Place 1 drop into both eyes 4 (four) times daily as needed for eye irritation.   Yes [provider]  nitroGLYCERIN (NITROSTAT) 0.4 MG SL tablet Place 1 tablet (  0.4 mg total) under the tongue every 5 (five) minutes x 3 doses as needed for chest pain. 06/29/21  Yes Kroeger, Daleen Snook M., PA-C  omeprazole (PRILOSEC) 20 MG capsule Take 1 capsule (20 mg total) by mouth daily before breakfast. 10/06/18  Yes Gherghe, Vella Redhead, MD  Semaglutide,0.25 or 0.5MG /DOS, (OZEMPIC, 0.25 OR 0.5 MG/DOSE,) 2 MG/1.5ML SOPN Inject 0.5 mg into the skin every 7 (seven) days. 09/07/21  Yes [provider]  ticagrelor (BRILINTA) 90 MG TABS tablet Take 1 tablet (90 mg total) by mouth 2 (two) times daily. 06/29/21  Yes Kroeger, Lorelee Cover., PA-C  Cholecalciferol 2000 UNITS CAPS Take 2,000 Units by mouth daily.     [provider]  lidocaine (LIDODERM) 5 % Place 1 patch onto the skin daily. Remove & Discard patch within 12 hours or as directed by MD Patient not taking: Reported on 11/14/2021 06/30/21   Abigail Butts., PA-C    Physical Exam: Vitals:   11/14/21 2100 11/14/21 2200 11/14/21 2230 11/14/21 2300  BP: 121/69 119/78 116/65 115/73  Pulse: 76 (!) 110 76 86  Resp: 20 (!) 32 (!) 21 (!) 28  Temp:       TempSrc:      SpO2: 100% 98% 98% 100%  Weight:      Height:       General: Patient was awake and alert and oriented x3. Not in any acute distress.  HEENT: NCAT.  PERRLA. EOMI. Sclerae anicteric.  Moist mucosal membranes. Neck: Neck supple without lymphadenopathy. No carotid bruits. No masses palpated.  Cardiovascular: Tachycardia, irregular rate with normal S1-S2 sounds. No murmurs, rubs or gallops auscultated.  Respiratory: Tachypnea.  Clear breath sounds.  No accessory muscle use. Abdomen: Soft, nontender, nondistended. Active bowel sounds. No masses or hepatosplenomegaly  Skin: No rashes, lesions, or ulcerations.  Dry, warm to touch. Musculoskeletal:  +2 edema bilaterally in lower extremities.  2+ dorsalis pedis and radial pulses. Good ROM.  No contractures  Psychiatric: Intact judgment and insight.  Mood appropriate to current condition. Neurologic: No focal neurological deficits. Strength is 5/5 x 4.  CN II - XII grossly intact.   Data Reviewed: EKG personally reviewed showed A-fib with RVR and nonspecific T wave abnormalities.  Assessment and Plan: * Paroxysmal atrial fibrillation with RVR ([place])- (present on admission) EKG personally reviewed showed A-fib with RVR with nonspecific T wave abnormalities Chads2 Vasc score was at least 3 Patient was started on IV diltiazem drip Continue Eliquis Continue telemetry and consider transitioning patient to Coreg when HR is consistently < 110 per cardiology recommendation. Echocardiogram will be done in the morning  Acute on chronic combined systolic and diastolic CHF (congestive heart failure) ([place])- (present on admission) Chest x-ray was suggestive of mild CHF IV Lasix 40 mg x 1 was given in the ED Continue total input/output, daily weights and fluid restriction Continue IV Lasix 40 mg twice daily Continue Cardiac diet  Echocardiogram done on 06/24/2021 showed LVEF of 50 to 55%.  LV has low normal function.  LV has no RWMA. LV  Diastolic parameters are consistent with grade 2 diastolic dysfunction (pseudonormalization).  Echocardiogram will be done in the morning     Chest pain- (present on admission) Cardiovascular risk factors include hypertension, hyperlipidemia, obesity, type 2 diabetes mellitus, tobacco abuse/cocaine abuse Continue telemetry  Troponins x 1 - 30 EKG personally reviewed showed A-fib with RVR with nonspecific T wave abnormalities Cardiology will be consulted to help decide if Stress test is needed in am Versus other  diagnostic modalities.            Diabetic neuropathy (HCC) Continue gabapentin  Hypoalbuminemia due to protein-calorie malnutrition (HCC) Albumin 3.3, protein supplement to be provided  OSA on CPAP Continue CPAP  Elevated troponin- (present on admission) Troponin x 2-  30 > 50, this is possibly secondary to type II demand ischemia Patient is currently chest pain-free, continue to trend troponin  Type 2 diabetes mellitus (HCC) Hemoglobin A1c about 4 months ago was 6.6 Continue ISS and hypoglycemic protocol  Mixed hyperlipidemia Continue statin per home regimen  GERD (gastroesophageal reflux disease)- (present on admission) Continue Protonix  Cocaine abuse ([place])- (present on admission) Urine drug screen was positive for cocaine Patient counseled on cocaine abuse cessation Avoid beta-blocker at this time  Hypokalemia- (present on admission) K+ is 3.2 K+ will be replenished Please monitor for AM K+ for further replenishmemnt   Smoking- (present on admission) Patient counseled on tobacco abuse cessation  Essential hypertension Patient is currently on IV diltiazem, consider transitioning to oral meds when patient is weaned off IV diltiazem drip  Morbid obesity-BMI 51- (present on admission) Patient was counseled on diet and lifestyle medication Patient will need follow-up with PCP for outpatient weight loss program  CAD - S/P LAD DES  3/11/22/14 Discontinue aspirin and ticagrelor Continue Plavix 300 mg tomorrow in the morning and then 75 mg daily per cardiology recommendation Continue statin    Advance Care Planning:   Code Status: Full Code   Consults: Cardiology  Family Communication: None at bedside  Severity of Illness: The appropriate patient status for this patient is INPATIENT. Inpatient status is judged to be reasonable and necessary in order to provide the required intensity of service to ensure the patient's safety. The patient's presenting symptoms, physical exam findings, and initial radiographic and laboratory data in the context of their chronic comorbidities is felt to place them at high risk for further clinical deterioration. Furthermore, it is not anticipated that the patient will be medically stable for discharge from the hospital within 2 midnights of admission.   * I certify that at the point of admission it is my clinical judgment that the patient will require inpatient hospital care spanning beyond 2 midnights from the point of admission due to high intensity of service, high risk for further deterioration and high frequency of surveillance required.*  Author: Bernadette Hoit, DO 11/14/2021 11:30 PM  For on call review [email or web address].

## 2021-11-14 NOTE — Plan of Care (Signed)
° °  Chris Powell presented today to [facility] ED with chest discomfort and was found to be in AF with RVR, a new diagnosis. Patient discussed with Dr. Gloris Manchester, EM.   Chris Powell has a history of ongoing cocaine and tobacco abuse, morbid obesity (BMI 54), OSA, HTN, type 2 DM, multivessel CAD s/p PCI to prox LAD in 2016 followed by prox LAD (ISR), mid LAD, and prox RCA in 2020, and most recently, PCI of a large ramus 06/26/2021 in the setting of unstable angina. He was discharged on aspirin and ticagrelor at that time. He has a history of HFrEF due to ICM, though at the time of his most recent PCI, his EF had recovered to 50-55%.   He has been started on diltiazem IV with improvement in his rate and chest pain. Other than the new AF, his ECG is unchanged from prior. He is again positive for cocaine. He will be admitted to [facility] for further management of AF. Recommendations on DAPT management was requested.   Recommendations:  - Would transition off diltiazem to carvedilol once rate is < 110 given concomitant CAD with ICM - Discontinue aspirin and ticagrelor - Start apixaban 5mg  BID, first dose tonight - Start Plavix 300mg  tomorrow AM then 75mg  daily. - He also has Class I indications for ACE/ARB and spironolactone - unclear why he is instead on clonidine and hydralazine for BP control. Would consider optimization of BP regimen.   - Given his continued cocaine use, morbid obesity, and OSA (unclear if using CPAP as prescribed), I agree he is a poor rhythm control candidate. He now has another complication related to his co-morbidities. Needs continued counseling on significant lifestyle changes.

## 2021-11-14 NOTE — Assessment & Plan Note (Addendum)
Patient is currently on IV diltiazem, consider transitioning to oral meds when patient is weaned off IV diltiazem drip

## 2021-11-14 NOTE — Assessment & Plan Note (Signed)
Continue CPAP.  

## 2021-11-14 NOTE — Assessment & Plan Note (Signed)
Chest x-ray was suggestive of mild CHF IV Lasix 40 mg x 1 was given in the ED Continue total input/output, daily weights and fluid restriction Continue IV Lasix 40 mg twice daily Continue Cardiac diet  Echocardiogram done on 06/24/2021 showed LVEF of 50 to 55%.  LV has low normal function.  LV has no RWMA. LV Diastolic parameters are consistent with grade 2 diastolic dysfunction (pseudonormalization).  Echocardiogram will be done in the morning

## 2021-11-14 NOTE — Assessment & Plan Note (Addendum)
EKG personally reviewed showed A-fib with RVR with nonspecific T wave abnormalities Chads2 Vasc score was at least 3 Patient was started on IV diltiazem drip Continue Eliquis Continue telemetry and consider transitioning patient to Coreg when HR is consistently < 110 per cardiology recommendation. Echocardiogram will be done in the morning

## 2021-11-14 NOTE — Assessment & Plan Note (Signed)
Continue gabapentin.

## 2021-11-14 NOTE — Assessment & Plan Note (Signed)
Patient was counseled on diet and lifestyle medication Patient will need follow-up with PCP for outpatient weight loss program

## 2021-11-14 NOTE — ED Notes (Signed)
Pt resting gave something to drink

## 2021-11-14 NOTE — Assessment & Plan Note (Signed)
Urine drug screen was positive for cocaine Patient counseled on cocaine abuse cessation Avoid beta-blocker at this time

## 2021-11-15 ENCOUNTER — Inpatient Hospital Stay (HOSPITAL_COMMUNITY): Payer: Medicaid Other

## 2021-11-15 ENCOUNTER — Encounter (HOSPITAL_COMMUNITY): Payer: Self-pay | Admitting: Internal Medicine

## 2021-11-15 ENCOUNTER — Other Ambulatory Visit (HOSPITAL_COMMUNITY): Payer: Self-pay | Admitting: *Deleted

## 2021-11-15 DIAGNOSIS — F1490 Cocaine use, unspecified, uncomplicated: Secondary | ICD-10-CM

## 2021-11-15 DIAGNOSIS — I480 Paroxysmal atrial fibrillation: Secondary | ICD-10-CM | POA: Diagnosis not present

## 2021-11-15 DIAGNOSIS — I25119 Atherosclerotic heart disease of native coronary artery with unspecified angina pectoris: Secondary | ICD-10-CM

## 2021-11-15 DIAGNOSIS — I4819 Other persistent atrial fibrillation: Secondary | ICD-10-CM

## 2021-11-15 DIAGNOSIS — I4891 Unspecified atrial fibrillation: Secondary | ICD-10-CM

## 2021-11-15 DIAGNOSIS — Z8679 Personal history of other diseases of the circulatory system: Secondary | ICD-10-CM

## 2021-11-15 LAB — CBC
HCT: 43.3 % (ref 39.0–52.0)
Hemoglobin: 13.9 g/dL (ref 13.0–17.0)
MCH: 29.8 pg (ref 26.0–34.0)
MCHC: 32.1 g/dL (ref 30.0–36.0)
MCV: 92.9 fL (ref 80.0–100.0)
Platelets: 273 10*3/uL (ref 150–400)
RBC: 4.66 MIL/uL (ref 4.22–5.81)
RDW: 14.8 % (ref 11.5–15.5)
WBC: 8 10*3/uL (ref 4.0–10.5)
nRBC: 0 % (ref 0.0–0.2)

## 2021-11-15 LAB — COMPREHENSIVE METABOLIC PANEL
ALT: 19 U/L (ref 0–44)
AST: 13 U/L — ABNORMAL LOW (ref 15–41)
Albumin: 3.2 g/dL — ABNORMAL LOW (ref 3.5–5.0)
Alkaline Phosphatase: 91 U/L (ref 38–126)
Anion gap: 7 (ref 5–15)
BUN: 14 mg/dL (ref 6–20)
CO2: 25 mmol/L (ref 22–32)
Calcium: 8.7 mg/dL — ABNORMAL LOW (ref 8.9–10.3)
Chloride: 107 mmol/L (ref 98–111)
Creatinine, Ser: 1.14 mg/dL (ref 0.61–1.24)
GFR, Estimated: >60 mL/min (ref >60)
Glucose, Bld: 116 mg/dL — ABNORMAL HIGH (ref 70–99)
Potassium: 3.2 mmol/L — ABNORMAL LOW (ref 3.5–5.1)
Sodium: 139 mmol/L (ref 135–145)
Total Bilirubin: 0.4 mg/dL (ref 0.3–1.2)
Total Protein: 7 g/dL (ref 6.5–8.1)

## 2021-11-15 LAB — ECHOCARDIOGRAM LIMITED
Height: 68 in
S' Lateral: 4.1 cm
Weight: 5911.86 oz

## 2021-11-15 LAB — GLUCOSE, CAPILLARY
Glucose-Capillary: 139 mg/dL — ABNORMAL HIGH (ref 70–99)
Glucose-Capillary: 170 mg/dL — ABNORMAL HIGH (ref 70–99)
Glucose-Capillary: 188 mg/dL — ABNORMAL HIGH (ref 70–99)

## 2021-11-15 LAB — MRSA NEXT GEN BY PCR, NASAL: MRSA by PCR Next Gen: NOT DETECTED

## 2021-11-15 LAB — TROPONIN I (HIGH SENSITIVITY)
Troponin I (High Sensitivity): 133 ng/L (ref <18)
Troponin I (High Sensitivity): 141 ng/L (ref <18)
Troponin I (High Sensitivity): 121 ng/L (ref <18)
Troponin I (High Sensitivity): 106 ng/L (ref <18)

## 2021-11-15 LAB — APTT: aPTT: 31 seconds (ref 24–36)

## 2021-11-15 LAB — PHOSPHORUS: Phosphorus: 2.5 mg/dL (ref 2.5–4.6)

## 2021-11-15 LAB — MAGNESIUM: Magnesium: 1.9 mg/dL (ref 1.7–2.4)

## 2021-11-15 MED ORDER — CARVEDILOL 12.5 MG PO TABS
25.0000 mg | ORAL_TABLET | Freq: Two times a day (BID) | ORAL | Status: DC
  Administered 2021-11-15 – 2021-11-16 (×2): 25 mg via ORAL
  Filled 2021-11-15 (×2): qty 2

## 2021-11-15 MED ORDER — CHLORHEXIDINE GLUCONATE CLOTH 2 % EX PADS
6.0000 | MEDICATED_PAD | Freq: Every day | CUTANEOUS | Status: DC
  Administered 2021-11-16: 6 via TOPICAL

## 2021-11-15 MED ORDER — IBUPROFEN 400 MG PO TABS
400.0000 mg | ORAL_TABLET | Freq: Once | ORAL | Status: AC
  Administered 2021-11-15: 400 mg via ORAL
  Filled 2021-11-15: qty 1

## 2021-11-15 MED ORDER — EMPAGLIFLOZIN 10 MG PO TABS
10.0000 mg | ORAL_TABLET | Freq: Every day | ORAL | Status: DC
  Administered 2021-11-15 – 2021-11-16 (×2): 10 mg via ORAL
  Filled 2021-11-15 (×5): qty 1

## 2021-11-15 MED ORDER — PERFLUTREN LIPID MICROSPHERE
1.0000 mL | INTRAVENOUS | Status: AC | PRN
  Administered 2021-11-15: 6 mL via INTRAVENOUS

## 2021-11-15 MED ORDER — LIDOCAINE 5 % EX PTCH
1.0000 | MEDICATED_PATCH | CUTANEOUS | Status: DC
  Administered 2021-11-15: 1 via TRANSDERMAL
  Filled 2021-11-15 (×5): qty 1

## 2021-11-15 MED ORDER — CARVEDILOL 12.5 MG PO TABS
12.5000 mg | ORAL_TABLET | Freq: Two times a day (BID) | ORAL | Status: DC
  Administered 2021-11-15: 12.5 mg via ORAL
  Filled 2021-11-15: qty 1

## 2021-11-15 MED ORDER — POTASSIUM CHLORIDE CRYS ER 20 MEQ PO TBCR
40.0000 meq | EXTENDED_RELEASE_TABLET | Freq: Once | ORAL | Status: AC
  Administered 2021-11-15: 40 meq via ORAL
  Filled 2021-11-15: qty 2

## 2021-11-15 NOTE — Progress Notes (Signed)
*  PRELIMINARY RESULTS* Echocardiogram Limited 2D Echocardiogram has been performed.  Chris Powell 11/15/2021, 9:14 AM

## 2021-11-15 NOTE — Progress Notes (Signed)
Patient declined CPAP. No unit in room at this time. °

## 2021-11-15 NOTE — Consult Note (Addendum)
Cardiology Consultation:   Patient ID: MAZON SMEDLEY MRN: [id number]; DOB: July 17, 1966  Admit date: 11/14/2021 Date of Consult: 11/15/2021  PCP:  Patient, No Pcp Per (Inactive)   [facility] Providers Cardiologist:  Mertie Moores, MD        Patient Profile:   Chris Powell is a 55 y.o. 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 Josephine Cables.  History of Present Illness:   Chris Powell was last examined by Dr. Acie Fredrickson in 06/2021 following his recent hospitalization for unstable angina during which he required stenting to the RI. He reported compliance with ASA and Brilinta but was not taking his other medications and was still using cocaine. Was encouraged to restart his statin and BP medications.   He presented to [facility] ED on 11/14/2021 for evaluation of chest pain. Reports being in his usual state of health until yesterday afternoon around 1500. Says he felt like he "swallowed an air bubble" and could not get it out. Reports pressure at that time and took SL NTGx3 with slight improvement but pain persisted. Says his pain resolved after arrival to the ED, lasting for over 4 hours total. Denies any associated palpitations. Does report intermittent dyspnea on exertion but no specific orthopnea, PND or pitting edema. Says he is compliant with his medications and the only one he has been without is Clonidine. He does have known sleep apnea but has not used his CPAP in years (falls asleep during mid-conversation today but quickly wakes back up). He does still use cocaine and reports using 10-12 days prior to admission. No plans to quit at this time and has been using intermittently since his 20's.   Initial labs showed  WBC 9.0, Hgb 14.6, platelets 304, Na+ 140, K+ 3.2 and creatinine 1.08. BNP 85. Initial Hs Troponin 30 with repeat values of 50, 133, 141 and 121. Negative for COVID and Influenza. UDS positive for cocaine. CXR consistent with CHF. EKG shows atrial fibrillation, HR 109 with PVC's. He has been in atrial fibrillation overnight with HR in the 80's to 90's with occasional PVC's.   He was started on IV Cardizem for rate-control and IV Lasix 40mg  BID for diuresis. The EDP did review with the overnight Cardiology fellow and it was recommended to stop ASA and Brilinta and transition to Plavix with a loading dose (scheduled for this 300mg  this AM followed by 75mg  daily) and start Eliquis 5mg  BID for anticoagulation. He was listed as being on Amlodipine 10mg  daily, Coreg 25mg  BID, Clonidine 0.3mg  BID, Jardiance 10mg  daily, Lasix 40mg  daily, Hydralazine 100mg  TID and Imdur 60mg  daily prior to admission and says he was taking these daily.   Past Medical History:  Diagnosis Date   Anemia    Arthritis    Asthma    Bone spur of ankle    right    CHF (congestive heart failure) (HCC)    COPD (chronic obstructive pulmonary disease) ([place])    Coronary artery disease    Depression    Diabetes ([place])    borderline    GERD (gastroesophageal reflux disease)    Hyperlipidemia    Hypertension    Myocardial infarction ([place])  Pneumonia    HX OF PNA   Right rotator cuff tear 01/06/2018   Shortness of breath dyspnea    Sleep apnea    USES CPAP    Past Surgical History:  Procedure Laterality Date   CARDIAC CATHETERIZATION  12/22/2014   CORONARY ANGIOPLASTY     CORONARY STENT INTERVENTION N/A 01/12/2019   Procedure: CORONARY STENT INTERVENTION;  Surgeon: Sherren Mocha, MD;  Location: [place] [facility];  Service: Cardiovascular;  Laterality: N/A;   CORONARY STENT INTERVENTION N/A 06/26/2021   Procedure: CORONARY STENT INTERVENTION;  Surgeon: Leonie Man, MD;  Location: [place] [facility];  Service:  Cardiovascular;  Laterality: N/A;   CORONARY STENT PLACEMENT  12/22/2014   LAD   KNEE SURGERY     LEFT HEART CATH N/A 01/12/2019   Procedure: Left Heart Cath;  Surgeon: Sherren Mocha, MD;  Location: [place] [facility];  Service: Cardiovascular;  Laterality: N/A;   LEFT HEART CATH AND CORONARY ANGIOGRAPHY N/A 12/16/2017   Procedure: LEFT HEART CATH AND CORONARY ANGIOGRAPHY;  Surgeon: Martinique, Peter M, MD;  Location: [place] [facility];  Service: Cardiovascular;  Laterality: N/A;   LEFT HEART CATH AND CORONARY ANGIOGRAPHY N/A 06/26/2021   Procedure: LEFT HEART CATH AND CORONARY ANGIOGRAPHY;  Surgeon: Leonie Man, MD;  Location: [place] [facility];  Service: Cardiovascular;  Laterality: N/A;   LEFT HEART CATHETERIZATION WITH CORONARY ANGIOGRAM N/A 12/22/2014   Procedure: LEFT HEART CATHETERIZATION WITH CORONARY ANGIOGRAM;  Surgeon: Jettie Booze, MD;  Location: [facility] C[facility] LAB;  Service: Cardiovascular;  Laterality: N/A;   RIGHT/LEFT HEART CATH AND CORONARY ANGIOGRAPHY N/A 01/08/2019   Procedure: RIGHT/LEFT HEART CATH AND CORONARY ANGIOGRAPHY;  Surgeon: Jolaine Artist, MD;  Location: [place] [facility];  Service: Cardiovascular;  Laterality: N/A;   SHOULDER ARTHROSCOPY WITH ROTATOR CUFF REPAIR AND SUBACROMIAL DECOMPRESSION Right 01/06/2018   Procedure: RIGHT SHOULDER ARTHROSCOPY  DEBRIDEMENT,ACROMIOPLASTY, ROTATOR CUFF REPAIR;  Surgeon: Marchia Bond, MD;  Location: [place];  Service: Orthopedics;  Laterality: Right;     Home Medications:  Prior to Admission medications   Medication Sig Start Date End Date Taking? Authorizing Provider  acetaminophen (TYLENOL) 500 MG tablet Take 500 mg by mouth every 6 (six) hours as needed for moderate pain.   Yes [provider]  albuterol (VENTOLIN HFA) 108 (90 Base) MCG/ACT inhaler Inhale 2 puffs into the lungs every 6 (six) hours as needed for wheezing or shortness of breath. 05/10/19  Yes Nahser, Wonda Cheng, MD  amLODipine (NORVASC) 10 MG tablet  Take 1 tablet (10 mg total) by mouth daily. 06/29/21  Yes Kroeger, Lorelee Cover., PA-C  aspirin 81 MG EC tablet Take 1 tablet (81 mg total) by mouth daily. Swallow whole. 06/30/21  Yes Kroeger, Daleen Snook M., PA-C  atorvastatin (LIPITOR) 80 MG tablet Take 1 tablet (80 mg total) by mouth daily at 6 PM. 06/29/21  Yes Kroeger, Daleen Snook M., PA-C  carvedilol (COREG) 25 MG tablet Take 1 tablet (25 mg total) by mouth 2 (two) times daily with a meal. 06/29/21  Yes Kroeger, Daleen Snook M., PA-C  cloNIDine (CATAPRES) 0.3 MG tablet Take 1 tablet (0.3 mg total) by mouth 2 (two) times daily. 06/29/21  Yes Kroeger, Daleen Snook M., PA-C  empagliflozin (JARDIANCE) 10 MG TABS tablet Take 10 mg by mouth daily.   Yes [provider]  furosemide (LASIX) 40 MG tablet Take 1 tablet (40 mg total) by mouth daily. 06/29/21  Yes Kroeger, Daleen Snook M., PA-C  gabapentin (NEURONTIN) 600 MG tablet  Take 1 tablet (600 mg total) by mouth 3 (three) times daily. 02/18/19  Yes Ronnie Doss M, DO  hydrALAZINE (APRESOLINE) 100 MG tablet Take 1 tablet (100 mg total) by mouth 3 (three) times daily. 06/29/21  Yes Kroeger, Daleen Snook M., PA-C  isosorbide mononitrate (IMDUR) 60 MG 24 hr tablet Take 1 tablet (60 mg total) by mouth daily. 06/30/21  Yes Kroeger, Daleen Snook M., PA-C  naphazoline-pheniramine (VISINE) 0.025-0.3 % ophthalmic solution Place 1 drop into both eyes 4 (four) times daily as needed for eye irritation.   Yes [provider]  nitroGLYCERIN (NITROSTAT) 0.4 MG SL tablet Place 1 tablet (0.4 mg total) under the tongue every 5 (five) minutes x 3 doses as needed for chest pain. 06/29/21  Yes Kroeger, Daleen Snook M., PA-C  omeprazole (PRILOSEC) 20 MG capsule Take 1 capsule (20 mg total) by mouth daily before breakfast. 10/06/18  Yes Gherghe, Vella Redhead, MD  Semaglutide,0.25 or 0.5MG /DOS, (OZEMPIC, 0.25 OR 0.5 MG/DOSE,) 2 MG/1.5ML SOPN Inject 0.5 mg into the skin every 7 (seven) days. 09/07/21  Yes [provider]  ticagrelor (BRILINTA) 90 MG TABS  tablet Take 1 tablet (90 mg total) by mouth 2 (two) times daily. 06/29/21  Yes Kroeger, Lorelee Cover., PA-C  Cholecalciferol 2000 UNITS CAPS Take 2,000 Units by mouth daily.     [provider]  lidocaine (LIDODERM) 5 % Place 1 patch onto the skin daily. Remove & Discard patch within 12 hours or as directed by MD Patient not taking: Reported on 11/14/2021 06/30/21   Abigail Butts., PA-C    Inpatient Medications: Scheduled Meds:  apixaban  5 mg Oral BID   atorvastatin  80 mg Oral q1800   carvedilol  12.5 mg Oral BID WC   Chlorhexidine Gluconate Cloth  6 each Topical Daily   [START ON 11/16/2021] clopidogrel  75 mg Oral Daily   empagliflozin  10 mg Oral Daily   feeding supplement (GLUCERNA SHAKE)  237 mL Oral TID BM   furosemide  40 mg Intravenous Q12H   gabapentin  600 mg Oral TID   insulin aspart  0-20 Units Subcutaneous TID WC   insulin aspart  0-5 Units Subcutaneous QHS   pantoprazole  40 mg Oral Daily   potassium chloride  40 mEq Oral Once   Continuous Infusions:  diltiazem (CARDIZEM) infusion 10 mg/hr (11/14/21 1945)   PRN Meds: nitroGLYCERIN  Allergies:   No Known Allergies  Social History:   Social History   Tobacco Use   Smoking status: Every Day    Packs/day: 1.00    Years: 30.00    Pack years: 30.00    Types: Cigarettes   Smokeless tobacco: Never   Tobacco comments:    " I have  tried vapor cigarettes"  Substance Use Topics   Alcohol use: Yes    Alcohol/week: 0.0 standard drinks    Comment: occ beer      Family History:    Family History  Problem Relation Age of Onset   Heart attack Father    Diabetes Father    Cancer Mother        leukemia   Hypertension Mother    Hyperlipidemia Mother    Cancer Sister    Colon cancer Neg Hx      ROS:  Please see the history of present illness.  All other ROS reviewed and negative.     Physical Exam/Data:   Vitals:   11/15/21 0200 11/15/21 0300 11/15/21 0400 11/15/21 0811  BP: 122/71 (!) 152/72 Marland Kitchen)  162/80   Pulse: 84 (!) 56 100   Resp: (!) 22 20 (!) 22   Temp:   98.4 F (36.9 C) 98.1 F (36.7 C)  TempSrc:   Oral Oral  SpO2: 95% 95% 98%   Weight:   (!) 167.6 kg   Height:        Intake/Output Summary (Last 24 hours) at 11/15/2021 0857 Last data filed at 11/15/2021 0600 Gross per 24 hour  Intake 109.66 ml  Output 200 ml  Net -90.34 ml   Last 3 Weights 11/15/2021 11/14/2021 07/23/2021  Weight (lbs) 369 lb 7.9 oz 360 lb 368 lb 6.4 oz  Weight (kg) 167.6 kg 163.295 kg 167.105 kg     Body mass index is 56.18 kg/m.  General: Pleasant obese male appearing in no acute distress.  HEENT: normal Neck: JVD difficult to assess secondary to body habitus but does not appear elevated. Vascular: No carotid bruits; Distal pulses 2+ bilaterally Cardiac:  normal S1, S2; Irregularly irregular.  Lungs:  clear to auscultation bilaterally, no wheezing, rhonchi or rales  Abd: soft, nontender, no hepatomegaly  Ext: trace lower extremity edema Musculoskeletal:  No deformities, BUE and BLE strength normal and equal Skin: warm and dry  Neuro:  CNs 2-12 intact, no focal abnormalities noted Psych:  Normal affect   EKG:  The EKG was personally reviewed and demonstrates: Atrial fibrillation, HR 109 with PVC's.   Telemetry:  Telemetry was personally reviewed and demonstrates: Atrial fibrillation HR in the 80's to 90's with occasional PVC's.   Relevant CV Studies:  Echo: 06/2021 IMPRESSIONS    1. Poor acoustical windows even with definity. Left ventricular ejection  fraction, by estimation, is 50 to 55%. The left ventricle has low normal  function. The left ventricle has no regional wall motion abnormalities.  There is moderate concentric left  ventricular hypertrophy. Left ventricular diastolic parameters are  consistent with Grade II diastolic dysfunction (pseudonormalization).   2. Right ventricular systolic function is normal. The right ventricular  size is mildly enlarged. Tricuspid  regurgitation signal is inadequate for  assessing PA pressure.   3. Left atrial size was moderately dilated.   4. The mitral valve is normal in structure. No evidence of mitral valve  regurgitation. No evidence of mitral stenosis.   5. The aortic valve is normal in structure. Aortic valve regurgitation is  not visualized. No aortic stenosis is present.   6. The inferior vena cava is normal in size with greater than 50%  respiratory variability, suggesting right atrial pressure of 3 mmHg.    LHC: 06/2021 CULPRIT LESION: Ramus-1 lesion is 90% stenosed.  Ramus-2 lesion is 70% stenosed.   A drug-eluting stent was successfully placed, covering both lesions, using a STENT ONYX FRONTIER [id number].  Postdilated tapered fashion from 3.1 to 2.8 mm   Post intervention, there is a 0% residual stenosis for both lesions.   Prox LAD to Mid LAD lesion is 25% stenosed -> just prior to stented segment   Mid LAD to Dist LAD overlapping stents is 25% stenosed.  (In-stent restenosis   Previously placed Prox RCA stent (drug-eluting) is  widely patent.   --------------------------------------------------------------   There is no aortic valve stenosis.   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 Successful DES PCI of the RI lesions with 1 single stent (Onyx frontier DES 2.5 mm at 34 mm postdilated from 3.1 to 2.8 mm) Severely elevated LVEDP with systemic hypertension Morbidly obese     RECOMMENDATIONS Femoral sheath removal based on ACT and PACU holding area. With elevated LVEDP, I have ordered 40 mg IV Lasix to be given in PACU.  Would consider the  possibility of oral loop diuretic on discharge Continue Aggressive Cardiac Risk Factor Medication/Guideline Directed Medical Therapy Needs to lose weight  Laboratory Data:  High Sensitivity Troponin:   Recent Labs  Lab 11/14/21 1915 11/14/21 2054 11/15/21 0033 11/15/21 0349 11/15/21 0702  TROPONINIHS 30* 50* 133* 141* 121*     Chemistry Recent Labs  Lab 11/14/21 1915 11/15/21 0349  NA 140 139  K 3.2* 3.2*  CL 108 107  CO2 25 25  GLUCOSE 120* 116*  BUN 14 14  CREATININE 1.08 1.14  CALCIUM 8.9 8.7*  MG 2.1 1.9  GFRNONAA >60 >60  ANIONGAP 7 7    Recent Labs  Lab 11/14/21 1915 11/15/21 0349  PROT 7.2 7.0  ALBUMIN 3.3* 3.2*  AST 16 13*  ALT 21 19  ALKPHOS 89 91  BILITOT 0.5 0.4   Hematology Recent Labs  Lab 11/14/21 1915 11/15/21 0349  WBC 9.0 8.0  RBC 4.92 4.66  HGB 14.6 13.9  HCT 45.5 43.3  MCV 92.5 92.9  MCH 29.7 29.8  MCHC 32.1 32.1  RDW 14.7 14.8  PLT 304 273    BNP Recent Labs  Lab 11/14/21 1915  BNP 85.0    Radiology/Studies:  DG Chest Portable 1 View  Result Date: 11/14/2021 CLINICAL DATA:  Chest pain for several hours EXAM: PORTABLE CHEST 1 VIEW COMPARISON:  06/23/2021 FINDINGS: Cardiac shadow is enlarged but accentuated by the portable technique. Lungs are well aerated bilaterally. Vascular congestion is noted without pulmonary edema. No focal infiltrate is seen. No bony abnormality is noted. IMPRESSION: Changes of mild CHF. Electronically Signed   By: Inez Catalina M.D.   On: 11/14/2021 19:33     Assessment and Plan:   1. New-Onset Atrial Fibrillation - New diagnosis for the patient this admission. Denies any palpitations at this time and unaware of any prior diagnosis.  - He is currently on 10mg  IV Cardizem. Will put in to restart Coreg but at a lower dose of 12.5mg  BID to make sure his BP remains stable and can titrate. Prefer to continue this over switching to Metoprolol given his cocaine use and Cardizem is not ideal given his  mildly reduced EF (repeat echo pending).  - He has been started on Eliquis 5mg  BID for anticoagulation. As discussed by the overnight Cardiologist, he is not an ideal candidate for rhythm control as he is likely to have recurrence given his OSA (not currently using CPAP), cocaine use and morbid obesity. He does not currently have a CPAP at home but previously tolerated. Given the timeframe since his last sleep study, he would benefit from referral for a repeat sleep study as an outpatient.   2. CAD/Elevated Troponin Values - He is s/p prior stenting of LAD in 2016, DESx2 to LAD and DESx1 to RCA in 12/2018 and DES to RI in 06/2021. Enzymes have been flat, peaking at 141 and likely secondary to demand ischemia in the setting of new-onset atrial fibrillation. His chest pain on admission was overall atypical for angina and lasted for 4+ hours.  -  Brilinta has been switched to Plavix with loading dose of 300mg  scheduled for today followed by 75mg  daily given the need to start Eliquis and ASA has been stopped. Continue Atorvastatin. Will restart BB and Jardiance.   3. HFmrEF - His EF was at 40-45% in 12/2018, at 50-55% in 06/2021. BNP normal at 85 on admission but likely not accurate given his body habitus. Scheduled to receive IV Lasix today but could likely switch back to PO Lasix 40mg  daily tomorrow.  - Repeat limited echo is pending and he may require further medication adjustments pending echo results. Will restart Coreg as outlined above and restart Jardiance. Anticipate starting low-dose ARB prior to discharge but will await echo results.   4. HTN - His BP has been variable from 102/56 - 162/80 since admission, SBP in the 140's on most recent check. Will restart Coreg at a lower dose of 12.5mg  BID and monitor HR and BP response. PTA Amlodipine, Clonidine, Hydralazine and Imdur held for now (was not taking Clonidine prior to admission).   5. HLD - He has been continued on PTA Atorvastatin 80mg  daily.    6. Hypokalemia - K+ at 3.2 this AM. Will order additional supplementation. Try to keep ~ 4.0.  7. Cocaine Use - He does report using cocaine about 1 week prior to admission. Cessation advised but he is not planning to quit at this time.     Risk Assessment/Risk Scores:      CHA2DS2-VASc Score = 3   This indicates a 3.2% annual risk of stroke. The patient's score is based upon: CHF History: 1 HTN History: 1 Diabetes History: 0 Stroke History: 0 Vascular Disease History: 1 Age Score: 0 Gender Score: 0     For questions or updates, please contact [place] Please consult [email or web address] for contact info under    Signed, Erma Heritage, PA-C  11/15/2021 8:57 AM    Attending note:  Patient seen and examined.  I reviewed his records and discussed the case with Ms. Delano Metz, I agree with her above findings.  Chris Powell presents with recent onset chest discomfort, now with newly documented atrial fibrillation but without clear evidence of ACS.  He has a history of CAD status post multiple PCI as reviewed above, also recurring cocaine abuse and untreated OSA.  Case discussed with cardiology team overnight.  On examination he appears comfortable at rest.  Recent heart rate in the 70-80 range and atrial fibrillation by telemetry which I personally reviewed.  Systolic blood pressures 0000000 to 160s.  Lungs exhibit decreased breath sounds but no wheezing.  Cardiac exam with irregularly irregular rhythm and 1/6 systolic murmur.  Pertinent lab work includes peak high-sensitivity troponin I of 141, potassium 3.2, BUN 14, creatinine 1.14, AST 13, ALT 19, hemoglobin 13.9, platelets 273, BNP 85, UDS positive for cocaine.  Chest x-ray shows vascular congestion without frank pulmonary edema.  I personally reviewed his ECG today which shows rate controlled atrial fibrillation with nonspecific ST-T wave abnormalities.  Newly documented persistent atrial fibrillation with CHA2DS2-VASc  score of 3.  This is in the setting of untreated OSA and recurring cocaine abuse making the likelihood of rhythm control exceedingly low.  He has been transitioned from aspirin and Brilinta to Plavix and Eliquis, will convert from IV diltiazem back to Coreg and adjust dose for adequate heart rate control.  Follow-up echocardiogram to reassess LVEF with prior history of HFmrEF (echocardiogram in October 2022 showed improvement in LVEF to 50 to 55% range).  At this point do not suspect ACS, high-sensitivity troponin I levels are more consistent with demand ischemia.  Obviously cessation of cocaine would be of significant benefit, also referral for treatment of OSA along with weight loss.  We will adjust medical therapy as tolerated.  Satira Sark, M.D., F.A.C.C.

## 2021-11-15 NOTE — Progress Notes (Signed)
PROGRESS NOTE     Chris Powell, is a 56 y.o. male, DOB - 1966-03-24, [id number]  Admit date - 11/14/2021   Admitting Physician Bernadette Hoit, DO  Outpatient Primary MD for the Chris Powell is Chris Powell, No Pcp Per (Inactive)  LOS - 1  Chief Complaint  Chris Powell presents with   Chest Pain        Brief Narrative:    56 y.o. male with medical history significant of chronic diastolic CHF, hypertension, CAD s/p stent placement, OSA on CPAP, hyperlipidemia, GERD, COPD, CHF, T2DM, obesity admitted on 11/14/2021 with A-fib with RVR in the setting of cocaine use    -Assessment and Plan: 1)New Onset atrial fibrillation with RVR (- Chads2 Vasc score was at least 3 -Rate control improved on IV Cardizem drip, cardiology recommends switching to p.o. Coreg from 5 mg twice daily Continue Eliquis  2)Morbid Obesity/OSA -Low calorie diet, portion control and increase physical activity discussed with Chris Powell -Chris Powell is Not interested in outpatient sleep study or CPAP use -Body mass index is 56.18 kg/m.  3) cocaine and tobacco abuse----Pt is Not interested in rehab  4)HTN--PTA was not taking clonidine -Coreg added for rate control  5)CAD--S/P LAD DES 3/11/22/14 -elevated troponin in the setting of tachyarrhythmia/A-fib with RVR--demand ischemia suspected -No further chest discomfort, troponins and EKG not consistent with ACS -Echocardiogram from 11/15/2021 without wall motion normalities EF is the same as previous at 50 to 55% -PTA Chris Powell was on aspirin, Brilinta, cardiology recommends using Plavix and Eliquis due to A-fib-- -aspirin and Brilinta has been discontinued -  6)HFrEF--EF borderline as noted above #5 -Clinically suspect acute on chronic combined systolic and diastolic dysfunction CHF exacerbation -Clinical exam and chest x-ray suggest volume overload/vascular congestion -BNP is not elevated but Chris Powell's BMI is 56 -Continue IV Lasix  7)DM2-with neuropathy, A1c less than 7  reflecting fair diabetic control -Use Novolog/Humalog Sliding scale insulin with Accu-Cheks/Fingersticks as ordered  CAD -  Discontinue aspirin and ticagrelor Continue Plavix 300 mg tomorrow in the morning and then 75 mg daily per cardiology recommendation Continue statin  Disposition/Need for in-Hospital Stay- Chris Powell unable to be discharged at this time due to A-fib with RVR with CHF exacerbation requiring IV diuresis*  Status is: Inpatient   Disposition: The Chris Powell is from: Home              Anticipated d/c is to: Home              Anticipated d/c date is: 2 days              Chris Powell currently is not medically stable to d/c. Barriers: Not Clinically Stable-   Code Status :  -  Code Status: Full Code   Family Communication:    NA (Chris Powell is alert, awake and coherent)   DVT Prophylaxis  :   - SCDs SCDs Start: 11/14/21 2319 apixaban (ELIQUIS) tablet 5 mg   Lab Results  Component Value Date   PLT 273 11/15/2021    Inpatient Medications  Scheduled Meds:  apixaban  5 mg Oral BID   atorvastatin  80 mg Oral q1800   carvedilol  25 mg Oral BID WC   Chlorhexidine Gluconate Cloth  6 each Topical Daily   [START ON 11/16/2021] clopidogrel  75 mg Oral Daily   empagliflozin  10 mg Oral Daily   feeding supplement (GLUCERNA SHAKE)  237 mL Oral TID BM   furosemide  40 mg Intravenous Q12H   gabapentin  600 mg Oral TID  insulin aspart  0-20 Units Subcutaneous TID WC   insulin aspart  0-5 Units Subcutaneous QHS   pantoprazole  40 mg Oral Daily   Continuous Infusions:  diltiazem (CARDIZEM) infusion Stopped (11/15/21 1244)   PRN Meds:.nitroGLYCERIN   Anti-infectives (From admission, onward)    None         Subjective: Darcus Pester today has no fevers, no emesis,  No further chest pain,   - dyspnea on exertion persist  Objective: Vitals:   11/15/21 1100 11/15/21 1400 11/15/21 1500 11/15/21 1700  BP:  (!) 183/87    Pulse:      Resp:  (!) 21 20   Temp: 98.1 F (36.7  C)   97.7 F (36.5 C)  TempSrc: Oral   Oral  SpO2:      Weight:      Height:        Intake/Output Summary (Last 24 hours) at 11/15/2021 1741 Last data filed at 11/15/2021 1500 Gross per 24 hour  Intake 154.39 ml  Output 1100 ml  Net -945.61 ml   Filed Weights   11/14/21 1843 11/15/21 0400  Weight: (!) 163.3 kg (!) 167.6 kg    Physical Exam  Gen:- Awake Alert,  morbidly obese, no conversational dyspnea HEENT:- [place].AT, No sclera icterus Neck-Supple Neck,No JVD,.  Lungs-decreased breath sounds, no wheezing  CV- S1, S2 normal, irregularly irregular  abd-  +ve B.Sounds, Abd Soft, No tenderness, increased truncal adiposity Extremity/Skin:- No  edema, pedal pulses present  Psych-affect is appropriate, oriented x3 Neuro-no new focal deficits, no tremors  Data Reviewed: I have personally reviewed following labs and imaging studies  CBC: Recent Labs  Lab 11/14/21 1915 11/15/21 0349  WBC 9.0 8.0  NEUTROABS 6.2  --   HGB 14.6 13.9  HCT 45.5 43.3  MCV 92.5 92.9  PLT 304 [id number]   Basic Metabolic Panel: Recent Labs  Lab 11/14/21 1915 11/15/21 0349  NA 140 139  K 3.2* 3.2*  CL 108 107  CO2 25 25  GLUCOSE 120* 116*  BUN 14 14  CREATININE 1.08 1.14  CALCIUM 8.9 8.7*  MG 2.1 1.9  PHOS  --  2.5   GFR: Estimated Creatinine Clearance: 111.9 mL/min (by C-G formula based on SCr of 1.14 mg/dL). Liver Function Tests: Recent Labs  Lab 11/14/21 1915 11/15/21 0349  AST 16 13*  ALT 21 19  ALKPHOS 89 91  BILITOT 0.5 0.4  PROT 7.2 7.0  ALBUMIN 3.3* 3.2*   Cardiac Enzymes: No results for input(s): CKTOTAL, CKMB, CKMBINDEX, TROPONINI in the last 168 hours. BNP (last 3 results) No results for input(s): PROBNP in the last 8760 hours. HbA1C: No results for input(s): HGBA1C in the last 72 hours. Sepsis Labs: @LABRCNTIP (procalcitonin:4,lacticidven:4) ) Recent Results (from the past 240 hour(s))  Resp Panel by RT-PCR (Flu A&B, Covid) Nasopharyngeal Swab     Status: None    Collection Time: 11/14/21 10:11 PM   Specimen: Nasopharyngeal Swab; Nasopharyngeal(NP) swabs in vial transport medium  Result Value Ref Range Status   SARS Coronavirus 2 by RT PCR NEGATIVE NEGATIVE Final    Comment: (NOTE) SARS-CoV-2 target nucleic acids are NOT DETECTED.  The SARS-CoV-2 RNA is generally detectable in upper respiratory specimens during the acute phase of infection. The lowest concentration of SARS-CoV-2 viral copies this assay can detect is 138 copies/mL. A negative result does not preclude SARS-Cov-2 infection and should not be used as the sole basis for treatment or other Chris Powell management decisions. A negative result may occur with  improper specimen collection/handling, submission of specimen other than nasopharyngeal swab, presence of viral mutation(s) within the areas targeted by this assay, and inadequate number of viral copies(<138 copies/mL). A negative result must be combined with clinical observations, Chris Powell history, and epidemiological information. The expected result is Negative.  Fact Sheet for Patients:  EntrepreneurPulse.com.au  Fact Sheet for Healthcare Providers:  IncredibleEmployment.be  This test is no t yet approved or cleared by the [place] FDA and  has been authorized for detection and/or diagnosis of SARS-CoV-2 by FDA under an Emergency Use Authorization (EUA). This EUA will remain  in effect (meaning this test can be used) for the duration of the COVID-19 declaration under Section 564(b)(1) of the Act, 21 U.S.C.section 360bbb-3(b)(1), unless the authorization is terminated  or revoked sooner.       Influenza A by PCR NEGATIVE NEGATIVE Final   Influenza B by PCR NEGATIVE NEGATIVE Final    Comment: (NOTE) The Xpert Xpress SARS-CoV-2/FLU/RSV plus assay is intended as an aid in the diagnosis of influenza from Nasopharyngeal swab specimens and should not be used as a sole basis for treatment.  Nasal washings and aspirates are unacceptable for Xpert Xpress SARS-CoV-2/FLU/RSV testing.  Fact Sheet for Patients: EntrepreneurPulse.com.au  Fact Sheet for Healthcare Providers: IncredibleEmployment.be  This test is not yet approved or cleared by the [place] FDA and has been authorized for detection and/or diagnosis of SARS-CoV-2 by FDA under an Emergency Use Authorization (EUA). This EUA will remain in effect (meaning this test can be used) for the duration of the COVID-19 declaration under Section 564(b)(1) of the Act, 21 U.S.C. section 360bbb-3(b)(1), unless the authorization is terminated or revoked.  Performed at [facility], [address] [place], [place] [address]   MRSA Next Gen by PCR, Nasal     Status: None   Collection Time: 11/15/21 12:18 AM   Specimen: Nasal Mucosa; Nasal Swab  Result Value Ref Range Status   MRSA by PCR Next Gen NOT DETECTED NOT DETECTED Final    Comment: (NOTE) The GeneXpert MRSA Assay (FDA approved for NASAL specimens only), is one component of a comprehensive MRSA colonization surveillance program. It is not intended to diagnose MRSA infection nor to guide or monitor treatment for MRSA infections. Test performance is not FDA approved in patients less than 11 years old. Performed at [facility], [address] [place], [place] [address]       Radiology Studies: DG Chest Portable 1 View  Result Date: 11/14/2021 CLINICAL DATA:  Chest pain for several hours EXAM: PORTABLE CHEST 1 VIEW COMPARISON:  06/23/2021 FINDINGS: Cardiac shadow is enlarged but accentuated by the portable technique. Lungs are well aerated bilaterally. Vascular congestion is noted without pulmonary edema. No focal infiltrate is seen. No bony abnormality is noted. IMPRESSION: Changes of mild CHF. Electronically Signed   By: Inez Catalina M.D.   On: 11/14/2021 19:33   ECHOCARDIOGRAM LIMITED  Result Date: 11/15/2021    ECHOCARDIOGRAM  LIMITED REPORT   Chris Powell Name:   Chris Powell Date of Exam: 11/15/2021 Medical Rec #:  [id number]        Height:       68.0 in Accession #:    [id number]       Weight:       369.5 lb Date of Birth:  December 08, 1965         BSA:          2.653 m Chris Powell Age:    36 years  BP:           162/80 mmHg Chris Powell Gender: M                HR:           91 bpm. Exam Location:  [facility] Procedure: Limited Echo and Intracardiac Opacification Agent Indications:    Atrial Fibrillation  History:        Chris Powell has prior history of Echocardiogram examinations, most                 recent 06/24/2021. CHF, CAD and Previous Myocardial Infarction,                 COPD, Arrythmias:Atrial Fibrillation, Signs/Symptoms:Chest Pain                 and Shortness of Breath; Risk Factors:Hypertension, Diabetes and                 Dyslipidemia.  Sonographer:    Wenda Low Referring Phys: [id number] OLADAPO ADEFESO  Sonographer Comments: Technically difficult study due to poor echo windows and Chris Powell is morbidly obese. IMPRESSIONS  1. Limited study.  2. Left ventricular ejection fraction, by estimation, is 50 to 55%. The left ventricle has low normal function. The left ventricle has no regional wall motion abnormalities. There is moderate left ventricular hypertrophy. Left ventricular diastolic parameters are indeterminate.  3. Right ventricular systolic function is normal. The right ventricular size is normal. Tricuspid regurgitation signal is inadequate for assessing PA pressure.  4. The inferior vena cava is normal in size with greater than 50% respiratory variability, suggesting right atrial pressure of 3 mmHg. Comparison(s): Prior images reviewed side by side. LVEF remains low normal range at 50-55%. FINDINGS  Left Ventricle: Left ventricular ejection fraction, by estimation, is 50 to 55%. The left ventricle has low normal function. The left ventricle has no regional wall motion abnormalities. Definity contrast agent was given IV to  delineate the left ventricular endocardial borders. The left ventricular internal cavity size was normal in size. There is moderate left ventricular hypertrophy. Left ventricular diastolic parameters are indeterminate. Left ventricular diastolic function could not be evaluated due to atrial fibrillation. Right Ventricle: The right ventricular size is normal. No increase in right ventricular wall thickness. Right ventricular systolic function is normal. Tricuspid regurgitation signal is inadequate for assessing PA pressure. Pericardium: Presence of epicardial fat layer. Aorta: The aortic root is normal in size and structure. Venous: The inferior vena cava is normal in size with greater than 50% respiratory variability, suggesting right atrial pressure of 3 mmHg. LEFT VENTRICLE PLAX 2D LVIDd:         5.30 cm LVIDs:         4.10 cm LV PW:         1.70 cm LV IVS:        1.50 cm LVOT diam:     2.00 cm LVOT Area:     3.14 cm  LEFT ATRIUM         Index LA diam:    5.20 cm 1.96 cm/m   AORTA Ao Root diam: 3.10 cm  SHUNTS Systemic Diam: 2.00 cm Rozann Lesches MD Electronically signed by Rozann Lesches MD Signature Date/Time: 11/15/2021/10:13:06 AM    Final      Scheduled Meds:  apixaban  5 mg Oral BID   atorvastatin  80 mg Oral q1800   carvedilol  25 mg Oral BID WC   Chlorhexidine Gluconate Cloth  6 each  Topical Daily   [START ON 11/16/2021] clopidogrel  75 mg Oral Daily   empagliflozin  10 mg Oral Daily   feeding supplement (GLUCERNA SHAKE)  237 mL Oral TID BM   furosemide  40 mg Intravenous Q12H   gabapentin  600 mg Oral TID   insulin aspart  0-20 Units Subcutaneous TID WC   insulin aspart  0-5 Units Subcutaneous QHS   pantoprazole  40 mg Oral Daily   Continuous Infusions:  diltiazem (CARDIZEM) infusion Stopped (11/15/21 1244)     LOS: 1 day    Roxan Hockey M.D on 11/15/2021 at 5:41 PM  Go to [email or web address] - for contact info  [facility] - Office  [phone]  If 7PM-7AM, please  contact night-coverage [email or web address] Password [facility] 11/15/2021, 5:41 PM

## 2021-11-15 NOTE — Progress Notes (Signed)
Patient declined CPAP again this evening;RT has unit on standby if needed.

## 2021-11-15 NOTE — Discharge Instructions (Addendum)
Information on my medicine - ELIQUIS (apixaban)  This medication education was reviewed with me or my healthcare representative as part of my discharge preparation.    Why was Eliquis prescribed for you? Eliquis was prescribed for you to reduce the risk of a blood clot forming that can cause a stroke if you have a medical condition called atrial fibrillation (a type of irregular heartbeat).  What do You need to know about Eliquis ? Take your Eliquis TWICE DAILY - one tablet in the morning and one tablet in the evening with or without food. If you have difficulty swallowing the tablet whole please discuss with your pharmacist how to take the medication safely.  Take Eliquis exactly as prescribed by your doctor and DO NOT stop taking Eliquis without talking to the doctor who prescribed the medication.  Stopping may increase your risk of developing a stroke.  Refill your prescription before you run out.  After discharge, you should have regular check-up appointments with your healthcare provider that is prescribing your Eliquis.  In the future your dose may need to be changed if your kidney function or weight changes by a significant amount or as you get older.  What do you do if you miss a dose? If you miss a dose, take it as soon as you remember on the same day and resume taking twice daily.  Do not take more than one dose of ELIQUIS at the same time to make up a missed dose.  Important Safety Information -1) you are taking apixaban/Eliquis which is a blood thinner for stroke prevention please  Avoid ibuprofen/Advil/Aleve/Motrin/Goody Powders/Naproxen/BC powders/Meloxicam/Diclofenac/Indomethacin and other Nonsteroidal anti-inflammatory medications as these will make you more likely to bleed and can cause stomach ulcers, can also cause Kidney problems.   2) please stop aspirin and Brilinta/ticagrelor, instead you will take Eliquis/apixaban and Plavix  3) please increase Coreg/carvedilol  to 37.5 mg twice daily, please decrease clonidine to 0.2 mg twice daily,  4) please hold hydralazine for now until you see your cardiologist Dr. Acie Fredrickson as outpatient  5) complete abstinence from tobacco and cocaine advised--outpatient or inpatient drug rehab program advised  6) you have obstructive sleep apnea----this can lead to right-sided heart failure, irregular heartbeat and other complications that can put her life at risk--please follow-up with pulmonology for outpatient sleep study and allowed to get the CPAP machine -Weight loss will also help improve your sleep apnea-  A possible side effect of Eliquis is bleeding. You should call your healthcare provider right away if you experience any of the following: Bleeding from an injury or your nose that does not stop. Unusual colored urine (red or dark brown) or unusual colored stools (red or black). Unusual bruising for unknown reasons. A serious fall or if you hit your head (even if there is no bleeding).  Some medicines may interact with Eliquis and might increase your risk of bleeding or clotting while on Eliquis. To help avoid this, consult your healthcare provider or pharmacist prior to using any new prescription or non-prescription medications, including herbals, vitamins, non-steroidal anti-inflammatory drugs (NSAIDs) and supplements.  This website has more information on Eliquis (apixaban): http://www.eliquis.com/eliquis/home   -1) you are taking apixaban/Eliquis which is a blood thinner for stroke prevention please  Avoid ibuprofen/Advil/Aleve/Motrin/Goody Powders/Naproxen/BC powders/Meloxicam/Diclofenac/Indomethacin and other Nonsteroidal anti-inflammatory medications as these will make you more likely to bleed and can cause stomach ulcers, can also cause Kidney problems.   2) please stop aspirin and Brilinta/ticagrelor, instead you will take Eliquis/apixaban and  Plavix  3) please increase Coreg/carvedilol to 37.5 mg twice  daily, please decrease clonidine to 0.2 mg twice daily,  4) please hold hydralazine for now until you see your cardiologist Dr. Acie Fredrickson as outpatient  5) complete abstinence from tobacco and cocaine advised--outpatient or inpatient drug rehab program advised  6) you have obstructive sleep apnea----this can lead to right-sided heart failure, irregular heartbeat and other complications that can put her life at risk--please follow-up with pulmonology for outpatient sleep study and allowed to get the CPAP machine -Weight loss will also help improve your sleep apnea

## 2021-11-15 NOTE — Plan of Care (Signed)

## 2021-11-15 NOTE — TOC Progression Note (Signed)
°  Transition of Care [facility]) Screening Note   Patient Details  Name: Chris Powell Date of Birth: March 23, 1966   Transition of Care [facility]) CM/SW Contact:    Shade Flood, LCSW Phone Number: 11/15/2021, 1:28 PM    Transition of Care Department [facility]) has reviewed patient and no TOC needs have been identified at this time. We will continue to monitor patient advancement through interdisciplinary progression rounds. If new patient transition needs arise, please place a TOC consult.

## 2021-11-16 ENCOUNTER — Other Ambulatory Visit: Payer: Self-pay | Admitting: Internal Medicine

## 2021-11-16 ENCOUNTER — Other Ambulatory Visit (HOSPITAL_COMMUNITY): Payer: Self-pay | Admitting: Internal Medicine

## 2021-11-16 DIAGNOSIS — Z8679 Personal history of other diseases of the circulatory system: Secondary | ICD-10-CM

## 2021-11-16 DIAGNOSIS — I25119 Atherosclerotic heart disease of native coronary artery with unspecified angina pectoris: Secondary | ICD-10-CM | POA: Diagnosis not present

## 2021-11-16 DIAGNOSIS — I4819 Other persistent atrial fibrillation: Secondary | ICD-10-CM

## 2021-11-16 DIAGNOSIS — M545 Low back pain, unspecified: Secondary | ICD-10-CM

## 2021-11-16 DIAGNOSIS — G8929 Other chronic pain: Secondary | ICD-10-CM

## 2021-11-16 DIAGNOSIS — I480 Paroxysmal atrial fibrillation: Secondary | ICD-10-CM | POA: Diagnosis not present

## 2021-11-16 DIAGNOSIS — F1490 Cocaine use, unspecified, uncomplicated: Secondary | ICD-10-CM

## 2021-11-16 LAB — RENAL FUNCTION PANEL
Albumin: 3.3 g/dL — ABNORMAL LOW (ref 3.5–5.0)
Anion gap: 8 (ref 5–15)
BUN: 13 mg/dL (ref 6–20)
CO2: 25 mmol/L (ref 22–32)
Calcium: 9.2 mg/dL (ref 8.9–10.3)
Chloride: 104 mmol/L (ref 98–111)
Creatinine, Ser: 0.96 mg/dL (ref 0.61–1.24)
GFR, Estimated: >60 mL/min (ref >60)
Glucose, Bld: 111 mg/dL — ABNORMAL HIGH (ref 70–99)
Phosphorus: 3.4 mg/dL (ref 2.5–4.6)
Potassium: 3.7 mmol/L (ref 3.5–5.1)
Sodium: 137 mmol/L (ref 135–145)

## 2021-11-16 LAB — TSH: TSH: 1.065 u[IU]/mL (ref 0.350–4.500)

## 2021-11-16 LAB — GLUCOSE, CAPILLARY
Glucose-Capillary: 141 mg/dL — ABNORMAL HIGH (ref 70–99)
Glucose-Capillary: 133 mg/dL — ABNORMAL HIGH (ref 70–99)

## 2021-11-16 LAB — HEMOGLOBIN A1C
Hgb A1c MFr Bld: 6.2 % — ABNORMAL HIGH (ref 4.8–5.6)
Mean Plasma Glucose: 131 mg/dL

## 2021-11-16 MED ORDER — AMLODIPINE BESYLATE 5 MG PO TABS
10.0000 mg | ORAL_TABLET | Freq: Every day | ORAL | Status: DC
Start: 2021-11-16 — End: 2021-11-16
  Administered 2021-11-16: 10 mg via ORAL
  Filled 2021-11-16: qty 2

## 2021-11-16 MED ORDER — CLONIDINE HCL 0.2 MG PO TABS: 0.2000 mg | ORAL_TABLET | Freq: Two times a day (BID) | ORAL | 11 refills | Status: AC

## 2021-11-16 MED ORDER — CARVEDILOL 25 MG PO TABS: 37.5000 mg | ORAL_TABLET | Freq: Two times a day (BID) | ORAL | 5 refills | Status: AC

## 2021-11-16 MED ORDER — ATORVASTATIN CALCIUM 80 MG PO TABS: 80.0000 mg | ORAL_TABLET | Freq: Every evening | ORAL | 11 refills | Status: AC

## 2021-11-16 MED ORDER — FUROSEMIDE 80 MG PO TABS: 80.0000 mg | ORAL_TABLET | Freq: Every day | ORAL | 3 refills | Status: AC

## 2021-11-16 MED ORDER — AMLODIPINE BESYLATE 10 MG PO TABS: 10.0000 mg | ORAL_TABLET | Freq: Every day | ORAL | 11 refills | Status: AC

## 2021-11-16 MED ORDER — ALBUTEROL SULFATE HFA 108 (90 BASE) MCG/ACT IN AERS: 2.0000 | INHALATION_SPRAY | Freq: Four times a day (QID) | RESPIRATORY_TRACT | 2 refills | Status: AC | PRN

## 2021-11-16 MED ORDER — APIXABAN 5 MG PO TABS: 5.0000 mg | ORAL_TABLET | Freq: Two times a day (BID) | ORAL | 11 refills | Status: AC

## 2021-11-16 MED ORDER — ISOSORBIDE MONONITRATE ER 60 MG PO TB24: 60.0000 mg | ORAL_TABLET | Freq: Every day | ORAL | 3 refills | Status: AC

## 2021-11-16 MED ORDER — PANTOPRAZOLE SODIUM 40 MG PO TBEC: 40.0000 mg | DELAYED_RELEASE_TABLET | Freq: Every day | ORAL | 11 refills | Status: AC

## 2021-11-16 MED ORDER — CLOPIDOGREL BISULFATE 75 MG PO TABS: 75.0000 mg | ORAL_TABLET | Freq: Every day | ORAL | 3 refills | Status: AC

## 2021-11-16 MED ORDER — CARVEDILOL 12.5 MG PO TABS
12.5000 mg | ORAL_TABLET | Freq: Once | ORAL | Status: AC
  Administered 2021-11-16: 12.5 mg via ORAL
  Filled 2021-11-16: qty 1

## 2021-11-16 NOTE — Progress Notes (Signed)
Patient received discharge papers to be discharged home. Peripheral ivs removed. Patient unhooked from monitor. Calling ride.

## 2021-11-16 NOTE — Discharge Summary (Incomplete)
Chris Powell, is a 56 y.o. male  DOB [birth year]  MRN [id number].  Admission date:  11/14/2021  Admitting Physician  Bernadette Hoit, DO  Discharge Date:  11/16/2021   Primary MD  Patient, No Pcp Per (Inactive)  Recommendations for primary care physician for things to follow:     -1) you are taking apixaban/Eliquis which is a blood thinner for stroke prevention please  Avoid ibuprofen/Advil/Aleve/Motrin/Goody Powders/Naproxen/BC powders/Meloxicam/Diclofenac/Indomethacin and other Nonsteroidal anti-inflammatory medications as these will make you more likely to bleed and can cause stomach ulcers, can also cause Kidney problems.   2) please stop aspirin and Brilinta/ticagrelor, instead you will take Eliquis/apixaban and Plavix  3) please increase Coreg/carvedilol to 37.5 mg twice daily, please decrease clonidine to 0.2 mg twice daily,  4) please hold hydralazine for now until you see your cardiologist Dr. Acie Fredrickson as outpatient  5) complete abstinence from tobacco and cocaine advised--outpatient or inpatient drug rehab program advised  6) you have obstructive sleep apnea----this can lead to right-sided heart failure, irregular heartbeat and other complications that can put her life at risk--please follow-up with pulmonology for outpatient sleep study and allowed to get the CPAP machine -Weight loss will also help improve your sleep apnea  Admission Diagnosis  Paroxysmal atrial fibrillation with RVR ([place]) [I48.0]   Discharge Diagnosis  Paroxysmal atrial fibrillation with RVR ([place]) [I48.0]  ***  Principal Problem:   Paroxysmal atrial fibrillation with RVR ([place]) Active Problems:   Acute on chronic combined systolic and diastolic CHF (congestive heart failure) ([place])   CAD - S/P LAD DES 3/11/22/14   Cocaine abuse ([place])   Type 2 diabetes mellitus ([place])   Morbid obesity-BMI 56   Essential hypertension    Smoking   Hypokalemia   GERD (gastroesophageal reflux disease)   Mixed hyperlipidemia   Elevated troponin   OSA on CPAP   Chest pain   Hypoalbuminemia due to protein-calorie malnutrition ([place])   Diabetic neuropathy ([place])      Past Medical History:  Diagnosis Date   Anemia    Arthritis    Asthma    Bone spur of ankle    right    CHF (congestive heart failure) (HCC)    COPD (chronic obstructive pulmonary disease) (HCC)    Coronary artery disease    Depression    Diabetes (HCC)    borderline    GERD (gastroesophageal reflux disease)    Hyperlipidemia    Hypertension    Myocardial infarction ([place])    Pneumonia    HX OF PNA   Right rotator cuff tear 01/06/2018   Shortness of breath dyspnea    Sleep apnea    USES CPAP    Past Surgical History:  Procedure Laterality Date   CARDIAC CATHETERIZATION  12/22/2014   CORONARY ANGIOPLASTY     CORONARY STENT INTERVENTION N/A 01/12/2019   Procedure: CORONARY STENT INTERVENTION;  Surgeon: Sherren Mocha, MD;  Location: [facility];  Service: Cardiovascular;  Laterality: N/A;   CORONARY STENT INTERVENTION N/A 06/26/2021  Procedure: CORONARY STENT INTERVENTION;  Surgeon: Leonie Man, MD;  Location: [facility];  Service: Cardiovascular;  Laterality: N/A;   CORONARY STENT PLACEMENT  12/22/2014   LAD   KNEE SURGERY     LEFT HEART CATH N/A 01/12/2019   Procedure: Left Heart Cath;  Surgeon: Sherren Mocha, MD;  Location: [facility];  Service: Cardiovascular;  Laterality: N/A;   LEFT HEART CATH AND CORONARY ANGIOGRAPHY N/A 12/16/2017   Procedure: LEFT HEART CATH AND CORONARY ANGIOGRAPHY;  Surgeon: Martinique, Peter M, MD;  Location: [facility];  Service: Cardiovascular;  Laterality: N/A;   LEFT HEART CATH AND CORONARY ANGIOGRAPHY N/A 06/26/2021   Procedure: LEFT HEART CATH AND CORONARY ANGIOGRAPHY;  Surgeon: Leonie Man, MD;  Location: [facility];  Service: Cardiovascular;  Laterality: N/A;   LEFT  HEART CATHETERIZATION WITH CORONARY ANGIOGRAM N/A 12/22/2014   Procedure: LEFT HEART CATHETERIZATION WITH CORONARY ANGIOGRAM;  Surgeon: Jettie Booze, MD;  Location: [facility];  Service: Cardiovascular;  Laterality: N/A;   RIGHT/LEFT HEART CATH AND CORONARY ANGIOGRAPHY N/A 01/08/2019   Procedure: RIGHT/LEFT HEART CATH AND CORONARY ANGIOGRAPHY;  Surgeon: Jolaine Artist, MD;  Location: [facility];  Service: Cardiovascular;  Laterality: N/A;   SHOULDER ARTHROSCOPY WITH ROTATOR CUFF REPAIR AND SUBACROMIAL DECOMPRESSION Right 01/06/2018   Procedure: RIGHT SHOULDER ARTHROSCOPY  DEBRIDEMENT,ACROMIOPLASTY, ROTATOR CUFF REPAIR;  Surgeon: Marchia Bond, MD;  Location: [facility];  Service: Orthopedics;  Laterality: Right;       HPI  from the history and physical done on the day of admission:     HPI: Chris Powell is a 56 y.o. male with medical history significant of chronic diastolic CHF, hypertension, CAD s/p stent placement, OSA on CPAP, hyperlipidemia, GERD, COPD, CHF, T2DM, obesity who presents to the emergency department via EMS due to chest pain which started this afternoon around 4 PM while at rest, chest pain was midsternal, nonradiating, sharp and rated as 8/10 on pain scale.  Patient states that he took 3 nitroglycerin sublingual with only slight improvement in the chest pain, he took extra 2 pills of nitroglycerin and the chest pain resolved.  Patient states that he also took 2 baby aspirin tablets at home.  He denies fever, chills, diaphoresis, shortness of breath, headache, nausea, vomiting, abdominal pain  ED course: In the emergency department, he was tachypneic, tachycardic, BP was 120/94, other vital signs were within normal range.  Work-up in the ED showed normal CBC, normal BMP except for hypokalemia, magnesium 2.1, lipase 27, BNP 85, urine drug screen was positive for cocaine, urinalysis was normal, troponin x1 -30 Chest x-ray showed changes of mild CHF Aspirin 162 mg  was given, potassium was replenished, IV Lasix 40 mg x 1 was given.  Patient was started on IV diltiazem drip due to A-fib with RVR.  Cardiology Kalman Shan, Nicki Reaper W) was consulted and recommended stopping patient's aspirin and ticagrelor and to start Eliquis and Plavix.  Hospitalist was asked to admit patient for further evaluation and management.   Review of Systems: As mentioned in the history of present illness. All other systems reviewed and are negative.     Hospital Course:   Brief Narrative:     56 y.o. male with medical history significant of chronic diastolic CHF, hypertension, CAD s/p stent placement, OSA on CPAP, hyperlipidemia, GERD, COPD, CHF, T2DM, obesity admitted on 11/14/2021 with A-fib with RVR in the setting of cocaine use     -Assessment and  Plan: 1)New Onset atrial fibrillation with RVR (- Chads2 Vasc score was at least 3 -Rate control improved on IV Cardizem drip, cardiology recommends switching to p.o. Coreg from 5 mg twice daily Continue Eliquis   2)Morbid Obesity/OSA -Low calorie diet, portion control and increase physical activity discussed with patient -Patient is Not interested in outpatient sleep study or CPAP use -Body mass index is 56.18 kg/m.   3) cocaine and tobacco abuse----Pt is Not interested in rehab   4)HTN--PTA was not taking clonidine -Coreg added for rate control   5)CAD--S/P LAD DES 3/11/22/14 -elevated troponin in the setting of tachyarrhythmia/A-fib with RVR--demand ischemia suspected -No further chest discomfort, troponins and EKG not consistent with ACS -Echocardiogram from 11/15/2021 without wall motion normalities EF is the same as previous at 50 to 55% -PTA patient was on aspirin, Brilinta, cardiology recommends using Plavix and Eliquis due to A-fib-- -aspirin and Brilinta has been discontinued -   6)HFrEF--EF borderline as noted above #5 -Clinically suspect acute on chronic combined systolic and diastolic dysfunction CHF  exacerbation -Clinical exam and chest x-ray suggest volume overload/vascular congestion -BNP is not elevated but patient's BMI is 56 -Continue IV Lasix   7)DM2-with neuropathy, A1c less than 7 reflecting fair diabetic control -Use Novolog/Humalog Sliding scale insulin with Accu-Cheks/Fingersticks as ordered   CAD -  Discontinue aspirin and ticagrelor Continue Plavix 300 mg tomorrow in the morning and then 75 mg daily per cardiology recommendation Continue statin   Disposition/--   Disposition: The patient is from: Home              Anticipated d/c is to: Home      Discharge Condition: ***  Follow UP   Follow-up Information     Tereso Newcomer T, PA-C Follow up on 12/05/2021.   Specialties: Cardiology, Physician Assistant Why: Cardiology Hospital Follow-up on 12/05/2021 at 2:20 PM with Tereso Newcomer, PA (works with Dr. Melburn Popper). Contact information: [address] [place] [address] [phone]                  Consults obtained - ***  Diet and Activity recommendation:  As advised  Discharge Instructions    **** Discharge Instructions     AMB referral to CHF clinic   Complete by: As directed    Amb referral to AFIB Clinic   Complete by: As directed    Call MD for:  difficulty breathing, headache or visual disturbances   Complete by: As directed    Call MD for:  persistant dizziness or light-headedness   Complete by: As directed    Call MD for:  temperature >100.4   Complete by: As directed    Diet - low sodium heart healthy   Complete by: As directed    Diet Carb Modified   Complete by: As directed    Discharge instructions   Complete by: As directed    1) you are taking apixaban/Eliquis which is a blood thinner for stroke prevention please  Avoid ibuprofen/Advil/Aleve/Motrin/Goody Powders/Naproxen/BC powders/Meloxicam/Diclofenac/Indomethacin and other Nonsteroidal anti-inflammatory medications as these will make you more likely to  bleed and can cause stomach ulcers, can also cause Kidney problems.   2) please stop aspirin and Brilinta/ticagrelor, instead you will take Eliquis/apixaban and Plavix  3) please increase Coreg/carvedilol to 37.5 mg twice daily, please decrease clonidine to 0.2 mg twice daily,  4) please hold hydralazine for now until you see your cardiologist Dr. Elease Hashimoto as outpatient  5) complete abstinence from tobacco and cocaine  advised--outpatient or inpatient drug rehab program advised  6) you have obstructive sleep apnea----this can lead to right-sided heart failure, irregular heartbeat and other complications that can put her life at risk--please follow-up with pulmonology for outpatient sleep study and allowed to get the CPAP machine -Weight loss will also help improve your sleep apnea   Increase activity slowly   Complete by: As directed          Discharge Medications     Allergies as of 11/16/2021   No Known Allergies      Medication List     STOP taking these medications    Aspirin Low Dose 81 MG EC tablet Generic drug: aspirin   Brilinta 90 MG Tabs tablet Generic drug: ticagrelor   hydrALAZINE 100 MG tablet Commonly known as: APRESOLINE   lidocaine 5 % Commonly known as: LIDODERM   omeprazole 20 MG capsule Commonly known as: PRILOSEC Replaced by: pantoprazole 40 MG tablet       TAKE these medications    acetaminophen 500 MG tablet Commonly known as: TYLENOL Take 500 mg by mouth every 6 (six) hours as needed for moderate pain.   albuterol 108 (90 Base) MCG/ACT inhaler Commonly known as: VENTOLIN HFA Inhale 2 puffs into the lungs every 6 (six) hours as needed for wheezing or shortness of breath.   amLODipine 10 MG tablet Commonly known as: NORVASC Take 1 tablet (10 mg total) by mouth daily.   apixaban 5 MG Tabs tablet Commonly known as: ELIQUIS Take 1 tablet (5 mg total) by mouth 2 (two) times daily. For stroke prevention   atorvastatin 80 MG  tablet Commonly known as: LIPITOR Take 1 tablet (80 mg total) by mouth every evening. What changed: when to take this   carvedilol 25 MG tablet Commonly known as: COREG Take 1.5 tablets (37.5 mg total) by mouth 2 (two) times daily with a meal. What changed: how much to take   Cholecalciferol 50 MCG (2000 UT) Caps Take 2,000 Units by mouth daily.   cloNIDine 0.2 MG tablet Commonly known as: CATAPRES Take 1 tablet (0.2 mg total) by mouth 2 (two) times daily. What changed:  medication strength how much to take   clopidogrel 75 MG tablet Commonly known as: PLAVIX Take 1 tablet (75 mg total) by mouth daily. Start taking on: November 17, 2021   empagliflozin 10 MG Tabs tablet Commonly known as: JARDIANCE Take 10 mg by mouth daily.   furosemide 80 MG tablet Commonly known as: Lasix Take 1 tablet (80 mg total) by mouth daily. What changed:  medication strength how much to take   gabapentin 600 MG tablet Commonly known as: NEURONTIN Take 1 tablet (600 mg total) by mouth 3 (three) times daily.   isosorbide mononitrate 60 MG 24 hr tablet Commonly known as: IMDUR Take 1 tablet (60 mg total) by mouth daily.   nitroGLYCERIN 0.4 MG SL tablet Commonly known as: NITROSTAT Place 1 tablet (0.4 mg total) under the tongue every 5 (five) minutes x 3 doses as needed for chest pain.   Ozempic (0.25 or 0.5 MG/DOSE) 2 MG/1.5ML Sopn Generic drug: Semaglutide(0.25 or 0.5MG /DOS) Inject 0.5 mg into the skin every 7 (seven) days.   pantoprazole 40 MG tablet Commonly known as: PROTONIX Take 1 tablet (40 mg total) by mouth daily. Start taking on: November 17, 2021 Replaces: omeprazole 20 MG capsule   Visine 0.025-0.3 % ophthalmic solution Generic drug: naphazoline-pheniramine Place 1 drop into both eyes 4 (four) times daily as needed for  eye irritation.        Major procedures and Radiology Reports - PLEASE review detailed and final reports for all details, in brief -   ***  DG  Chest Portable 1 View  Result Date: 11/14/2021 CLINICAL DATA:  Chest pain for several hours EXAM: PORTABLE CHEST 1 VIEW COMPARISON:  06/23/2021 FINDINGS: Cardiac shadow is enlarged but accentuated by the portable technique. Lungs are well aerated bilaterally. Vascular congestion is noted without pulmonary edema. No focal infiltrate is seen. No bony abnormality is noted. IMPRESSION: Changes of mild CHF. Electronically Signed   By: Inez Catalina M.D.   On: 11/14/2021 19:33   ECHOCARDIOGRAM LIMITED  Result Date: 11/15/2021    ECHOCARDIOGRAM LIMITED REPORT   Patient Name:   Chris Powell Date of Exam: 11/15/2021 Medical Rec #:  [id number]        Height:       68.0 in Accession #:    [id number]       Weight:       369.5 lb Date of Birth:  Apr 12, 1966         BSA:          2.653 m Patient Age:    60 years         BP:           162/80 mmHg Patient Gender: M                HR:           91 bpm. Exam Location:  [facility] Procedure: Limited Echo and Intracardiac Opacification Agent Indications:    Atrial Fibrillation  History:        Patient has prior history of Echocardiogram examinations, most                 recent 06/24/2021. CHF, CAD and Previous Myocardial Infarction,                 COPD, Arrythmias:Atrial Fibrillation, Signs/Symptoms:Chest Pain                 and Shortness of Breath; Risk Factors:Hypertension, Diabetes and                 Dyslipidemia.  Sonographer:    Wenda Low Referring Phys: [id number] OLADAPO ADEFESO  Sonographer Comments: Technically difficult study due to poor echo windows and patient is morbidly obese. IMPRESSIONS  1. Limited study.  2. Left ventricular ejection fraction, by estimation, is 50 to 55%. The left ventricle has low normal function. The left ventricle has no regional wall motion abnormalities. There is moderate left ventricular hypertrophy. Left ventricular diastolic parameters are indeterminate.  3. Right ventricular systolic function is normal. The right ventricular size  is normal. Tricuspid regurgitation signal is inadequate for assessing PA pressure.  4. The inferior vena cava is normal in size with greater than 50% respiratory variability, suggesting right atrial pressure of 3 mmHg. Comparison(s): Prior images reviewed side by side. LVEF remains low normal range at 50-55%. FINDINGS  Left Ventricle: Left ventricular ejection fraction, by estimation, is 50 to 55%. The left ventricle has low normal function. The left ventricle has no regional wall motion abnormalities. Definity contrast agent was given IV to delineate the left ventricular endocardial borders. The left ventricular internal cavity size was normal in size. There is moderate left ventricular hypertrophy. Left ventricular diastolic parameters are indeterminate. Left ventricular diastolic function could not be evaluated due to atrial fibrillation. Right Ventricle: The right ventricular size  is normal. No increase in right ventricular wall thickness. Right ventricular systolic function is normal. Tricuspid regurgitation signal is inadequate for assessing PA pressure. Pericardium: Presence of epicardial fat layer. Aorta: The aortic root is normal in size and structure. Venous: The inferior vena cava is normal in size with greater than 50% respiratory variability, suggesting right atrial pressure of 3 mmHg. LEFT VENTRICLE PLAX 2D LVIDd:         5.30 cm LVIDs:         4.10 cm LV PW:         1.70 cm LV IVS:        1.50 cm LVOT diam:     2.00 cm LVOT Area:     3.14 cm  LEFT ATRIUM         Index LA diam:    5.20 cm 1.96 cm/m   AORTA Ao Root diam: 3.10 cm  SHUNTS Systemic Diam: 2.00 cm Rozann Lesches MD Electronically signed by Rozann Lesches MD Signature Date/Time: 11/15/2021/10:13:06 AM    Final     Micro Results   *** Recent Results (from the past 240 hour(s))  Resp Panel by RT-PCR (Flu A&B, Covid) Nasopharyngeal Swab     Status: None   Collection Time: 11/14/21 10:11 PM   Specimen: Nasopharyngeal Swab;  Nasopharyngeal(NP) swabs in vial transport medium  Result Value Ref Range Status   SARS Coronavirus 2 by RT PCR NEGATIVE NEGATIVE Final    Comment: (NOTE) SARS-CoV-2 target nucleic acids are NOT DETECTED.  The SARS-CoV-2 RNA is generally detectable in upper respiratory specimens during the acute phase of infection. The lowest concentration of SARS-CoV-2 viral copies this assay can detect is 138 copies/mL. A negative result does not preclude SARS-Cov-2 infection and should not be used as the sole basis for treatment or other patient management decisions. A negative result may occur with  improper specimen collection/handling, submission of specimen other than nasopharyngeal swab, presence of viral mutation(s) within the areas targeted by this assay, and inadequate number of viral copies(<138 copies/mL). A negative result must be combined with clinical observations, patient history, and epidemiological information. The expected result is Negative.  Fact Sheet for Patients:  EntrepreneurPulse.com.au  Fact Sheet for Healthcare Providers:  IncredibleEmployment.be  This test is no t yet approved or cleared by the [place] FDA and  has been authorized for detection and/or diagnosis of SARS-CoV-2 by FDA under an Emergency Use Authorization (EUA). This EUA will remain  in effect (meaning this test can be used) for the duration of the COVID-19 declaration under Section 564(b)(1) of the Act, 21 U.S.C.section 360bbb-3(b)(1), unless the authorization is terminated  or revoked sooner.       Influenza A by PCR NEGATIVE NEGATIVE Final   Influenza B by PCR NEGATIVE NEGATIVE Final    Comment: (NOTE) The Xpert Xpress SARS-CoV-2/FLU/RSV plus assay is intended as an aid in the diagnosis of influenza from Nasopharyngeal swab specimens and should not be used as a sole basis for treatment. Nasal washings and aspirates are unacceptable for Xpert Xpress  SARS-CoV-2/FLU/RSV testing.  Fact Sheet for Patients: EntrepreneurPulse.com.au  Fact Sheet for Healthcare Providers: IncredibleEmployment.be  This test is not yet approved or cleared by the [place] FDA and has been authorized for detection and/or diagnosis of SARS-CoV-2 by FDA under an Emergency Use Authorization (EUA). This EUA will remain in effect (meaning this test can be used) for the duration of the COVID-19 declaration under Section 564(b)(1) of the Act, 21 U.S.C. section 360bbb-3(b)(1), unless  the authorization is terminated or revoked.  Performed at [facility], [address], [place], [place] [address]   MRSA Next Gen by PCR, Nasal     Status: None   Collection Time: 11/15/21 12:18 AM   Specimen: Nasal Mucosa; Nasal Swab  Result Value Ref Range Status   MRSA by PCR Next Gen NOT DETECTED NOT DETECTED Final    Comment: (NOTE) The GeneXpert MRSA Assay (FDA approved for NASAL specimens only), is one component of a comprehensive MRSA colonization surveillance program. It is not intended to diagnose MRSA infection nor to guide or monitor treatment for MRSA infections. Test performance is not FDA approved in patients less than 53 years old. Performed at [facility], [address] [place], [place] [address]     Today   Subjective    Chris Powell today has no ***          Patient has been seen and examined prior to discharge   Objective   Blood pressure (!) 158/109, pulse (!) 107, temperature 98.2 F (36.8 C), temperature source Oral, resp. rate 19, height 5\' 8"  (1.727 m), weight (!) 167.6 kg, SpO2 97 %.   Intake/Output Summary (Last 24 hours) at 11/16/2021 1032 Last data filed at 11/16/2021 0939 Gross per 24 hour  Intake 1124.73 ml  Output 4000 ml  Net -2875.27 ml    Exam Gen:- Awake Alert, no acute distress *** HEENT:- [place].AT, No sclera icterus Neck-Supple Neck,No JVD,.  Lungs-  CTAB , good air movement  bilaterally CV- S1, S2 normal, regular Abd-  +ve B.Sounds, Abd Soft, No tenderness,    Extremity/Skin:- No  edema,   good pulses Psych-affect is appropriate, oriented x3 Neuro-no new focal deficits, no tremors ***   Data Review   CBC w Diff:  Lab Results  Component Value Date   WBC 8.0 11/15/2021   HGB 13.9 11/15/2021   HGB 15.2 04/07/2018   HCT 43.3 11/15/2021   HCT 44.0 04/07/2018   PLT 273 11/15/2021   PLT 278 04/07/2018   LYMPHOPCT 21 11/14/2021   MONOPCT 9 11/14/2021   EOSPCT 1 11/14/2021   BASOPCT 1 11/14/2021    CMP:  Lab Results  Component Value Date   NA 137 11/16/2021   NA 142 04/07/2018   K 3.7 11/16/2021   CL 104 11/16/2021   CO2 25 11/16/2021   BUN 13 11/16/2021   BUN 11 04/07/2018   CREATININE 0.96 11/16/2021   CREATININE 0.95 11/22/2015   PROT 7.0 11/15/2021   PROT 6.9 04/07/2018   ALBUMIN 3.3 (L) 11/16/2021   ALBUMIN 3.9 04/07/2018   BILITOT 0.4 11/15/2021   BILITOT 0.3 04/07/2018   ALKPHOS 91 11/15/2021   AST 13 (L) 11/15/2021   ALT 19 11/15/2021  .  Total Discharge time is about 33 minutes  Roxan Hockey M.D on 11/16/2021 at 10:32 AM  Go to [email or web address] -  for contact info  [facility] - Office  [phone]

## 2021-11-16 NOTE — Progress Notes (Addendum)
Progress Note  Patient Name: Chris Powell Date of Encounter: 11/16/2021  Primary Cardiologist: Mertie Moores, MD  Subjective   Patient up in room with heart rate elevated, but settles down to the 90s at rest in atrial fibrillation.  No chest pain or shortness of breath.  Wants to go home.  Inpatient Medications    Scheduled Meds:  apixaban  5 mg Oral BID   atorvastatin  80 mg Oral q1800   carvedilol  25 mg Oral BID WC   Chlorhexidine Gluconate Cloth  6 each Topical Daily   clopidogrel  75 mg Oral Daily   empagliflozin  10 mg Oral Daily   feeding supplement (GLUCERNA SHAKE)  237 mL Oral TID BM   furosemide  40 mg Intravenous Q12H   gabapentin  600 mg Oral TID   insulin aspart  0-20 Units Subcutaneous TID WC   insulin aspart  0-5 Units Subcutaneous QHS   lidocaine  1 patch Transdermal Q24H   pantoprazole  40 mg Oral Daily    PRN Meds: nitroGLYCERIN   Vital Signs    Vitals:   11/15/21 1930 11/15/21 2355 11/16/21 0413 11/16/21 0814  BP: (!) 156/100 (!) 164/98 (!) 166/123 (!) 158/109  Pulse: 98 (!) 110  (!) 107  Resp: 20 (!) 30 19   Temp:  97.8 F (36.6 C) 98 F (36.7 C)   TempSrc:  Oral Oral   SpO2: 98% 98% 97%   Weight:      Height:        Intake/Output Summary (Last 24 hours) at 11/16/2021 0827 Last data filed at 11/16/2021 0814 Gross per 24 hour  Intake 1124.73 ml  Output 2400 ml  Net -1275.27 ml   Filed Weights   11/14/21 1843 11/15/21 0400  Weight: (!) 163.3 kg (!) 167.6 kg    Telemetry    Atrial fibrillation.  Personally reviewed.  ECG    An ECG dated 11/16/2021 was personally reviewed today and demonstrated:  Atrial fibrillation with nonspecific T wave changes.  Physical Exam   GEN: No acute distress.   Neck: No JVD. Cardiac: Irregularly irregular without gallop.  Respiratory: Nonlabored. Clear to auscultation bilaterally. GI: Obese with pannus, bowel sounds present. MS: Trace lower leg edema; No deformity. Neuro:  Nonfocal. Psych:  Alert and oriented x 3. Normal affect.  Labs    Chemistry Recent Labs  Lab 11/14/21 1915 11/15/21 0349 11/16/21 0349  NA 140 139 137  K 3.2* 3.2* 3.7  CL 108 107 104  CO2 25 25 25   GLUCOSE 120* 116* 111*  BUN 14 14 13   CREATININE 1.08 1.14 0.96  CALCIUM 8.9 8.7* 9.2  PROT 7.2 7.0  --   ALBUMIN 3.3* 3.2* 3.3*  AST 16 13*  --   ALT 21 19  --   ALKPHOS 89 91  --   BILITOT 0.5 0.4  --   GFRNONAA >60 >60 >60  ANIONGAP 7 7 8      Hematology Recent Labs  Lab 11/14/21 1915 11/15/21 0349  WBC 9.0 8.0  RBC 4.92 4.66  HGB 14.6 13.9  HCT 45.5 43.3  MCV 92.5 92.9  MCH 29.7 29.8  MCHC 32.1 32.1  RDW 14.7 14.8  PLT 304 273    Cardiac Enzymes Recent Labs  Lab 11/14/21 2054 11/15/21 0033 11/15/21 0349 11/15/21 0702 11/15/21 0915  TROPONINIHS 50* 133* 141* 121* 106*    BNP Recent Labs  Lab 11/14/21 1915  BNP 85.0     Radiology  DG Chest Portable 1 View  Result Date: 11/14/2021 CLINICAL DATA:  Chest pain for several hours EXAM: PORTABLE CHEST 1 VIEW COMPARISON:  06/23/2021 FINDINGS: Cardiac shadow is enlarged but accentuated by the portable technique. Lungs are well aerated bilaterally. Vascular congestion is noted without pulmonary edema. No focal infiltrate is seen. No bony abnormality is noted. IMPRESSION: Changes of mild CHF. Electronically Signed   By: Inez Catalina M.D.   On: 11/14/2021 19:33   ECHOCARDIOGRAM LIMITED  Result Date: 11/15/2021    ECHOCARDIOGRAM LIMITED REPORT   Patient Name:   Chris Powell Date of Exam: 11/15/2021 Medical Rec #:  [id number]        Height:       68.0 in Accession #:    [id number]       Weight:       369.5 lb Date of Birth:  [birth year]         BSA:          2.653 m Patient Age:    56 years         BP:           162/80 mmHg Patient Gender: M                HR:           91 bpm. Exam Location:  [facility] Procedure: Limited Echo and Intracardiac Opacification Agent Indications:    Atrial Fibrillation  History:        Patient has  prior history of Echocardiogram examinations, most                 recent 06/24/2021. CHF, CAD and Previous Myocardial Infarction,                 COPD, Arrythmias:Atrial Fibrillation, Signs/Symptoms:Chest Pain                 and Shortness of Breath; Risk Factors:Hypertension, Diabetes and                 Dyslipidemia.  Sonographer:    Wenda Low Referring Phys: [id number] OLADAPO ADEFESO  Sonographer Comments: Technically difficult study due to poor echo windows and patient is morbidly obese. IMPRESSIONS  1. Limited study.  2. Left ventricular ejection fraction, by estimation, is 50 to 55%. The left ventricle has low normal function. The left ventricle has no regional wall motion abnormalities. There is moderate left ventricular hypertrophy. Left ventricular diastolic parameters are indeterminate.  3. Right ventricular systolic function is normal. The right ventricular size is normal. Tricuspid regurgitation signal is inadequate for assessing PA pressure.  4. The inferior vena cava is normal in size with greater than 50% respiratory variability, suggesting right atrial pressure of 3 mmHg. Comparison(s): Prior images reviewed side by side. LVEF remains low normal range at 50-55%. FINDINGS  Left Ventricle: Left ventricular ejection fraction, by estimation, is 50 to 55%. The left ventricle has low normal function. The left ventricle has no regional wall motion abnormalities. Definity contrast agent was given IV to delineate the left ventricular endocardial borders. The left ventricular internal cavity size was normal in size. There is moderate left ventricular hypertrophy. Left ventricular diastolic parameters are indeterminate. Left ventricular diastolic function could not be evaluated due to atrial fibrillation. Right Ventricle: The right ventricular size is normal. No increase in right ventricular wall thickness. Right ventricular systolic function is normal. Tricuspid regurgitation signal is inadequate for  assessing PA pressure. Pericardium: Presence of epicardial fat layer. Aorta:  The aortic root is normal in size and structure. Venous: The inferior vena cava is normal in size with greater than 50% respiratory variability, suggesting right atrial pressure of 3 mmHg. LEFT VENTRICLE PLAX 2D LVIDd:         5.30 cm LVIDs:         4.10 cm LV PW:         1.70 cm LV IVS:        1.50 cm LVOT diam:     2.00 cm LVOT Area:     3.14 cm  LEFT ATRIUM         Index LA diam:    5.20 cm 1.96 cm/m   AORTA Ao Root diam: 3.10 cm  SHUNTS Systemic Diam: 2.00 cm Rozann Lesches MD Electronically signed by Rozann Lesches MD Signature Date/Time: 11/15/2021/10:13:06 AM    Final     Assessment & Plan    1.  Newly documented persistent atrial fibrillation presenting with RVR.  CHA2DS2-VASc score is 3.  He has been transitioned from IV diltiazem back to Coreg.  Also started on Eliquis for stroke prophylaxis.  2. 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  3.  Untreated OSA.  4.  Recurrent cocaine abuse.  5.  CAD status post previous PCI as reviewed and consultation note, most recently DES to the ramus intermedius in October 2022.  High-sensitivity troponin I levels are most consistent with demand ischemia in the setting of atrial fibrillation rather than ACS.  Patient wants to go home today.  Discussed situation with him as well as medication changes and follow-up plan.  Would increase Coreg to 37.5 mg twice daily, continue Plavix and Eliquis.  Continue Jardiance and Lipitor.  Can go back on higher dose of outpatient Lasix as well as Norvasc and Imdur.  Hydralazine can be added back thereafter as an outpatient.  He will need to have follow-up in the next week with Dr. Acie Fredrickson or [place].  Heart rate control strategy will be pursued as the likelihood of maintaining sinus rhythm is quite low in the setting of untreated OSA and recurring cocaine abuse.  Signed, Rozann Lesches, MD  11/16/2021, 8:27 AM

## 2021-11-16 NOTE — Progress Notes (Signed)
Patient taken down to main entrance to wait for ride to be discharged

## 2021-12-05 ENCOUNTER — Ambulatory Visit: Payer: Medicaid Other | Admitting: Physician Assistant

## 2021-12-10 ENCOUNTER — Ambulatory Visit (HOSPITAL_COMMUNITY)
Admission: RE | Admit: 2021-12-10 | Discharge: 2021-12-10 | Disposition: A | Discharge status: Home / Self Care | Payer: No Typology Code available for payment source | Source: Ambulatory Visit | Attending: Internal Medicine | Admitting: Internal Medicine

## 2021-12-10 ENCOUNTER — Other Ambulatory Visit: Payer: Self-pay

## 2021-12-10 DIAGNOSIS — M545 Low back pain, unspecified: Secondary | ICD-10-CM | POA: Diagnosis present

## 2021-12-10 DIAGNOSIS — G8929 Other chronic pain: Secondary | ICD-10-CM | POA: Insufficient documentation

## 2021-12-12 ENCOUNTER — Ambulatory Visit: Payer: Medicaid Other | Admitting: Physician Assistant

## 2021-12-26 ENCOUNTER — Encounter: Payer: Self-pay | Admitting: Physical Medicine & Rehabilitation

## 2022-01-11 ENCOUNTER — Ambulatory Visit: Payer: Medicaid Other | Admitting: Cardiovascular Disease

## 2022-01-20 NOTE — Progress Notes (Deleted)
Cardiology Office Note:    Date:  01/20/2022   ID:  Chris Powell, DOB 10/12/1965, MRN [id number]  PCP:  Patient, No Pcp Per (Inactive)   [facility] Providers Cardiologist:  Kristeen MissPhilip Nahser, MD { Click to update primary MD,subspecialty MD or APP then REFRESH:1}    Referring MD: No ref. provider found   Chief Complaint: post hospital follow-up atrial fibrillation  History of Present Illness:    Chris Powell is a *** 56 y.o. male with a hx of CAD with cardiac stenting, hypertension, atrial fibrillation, COPD, OSA, hypokalemia, tobacco abuse, cocaine abuse, and morbid obesity.  Admission 12/21/14 with NSTEMI, PTCA and stent to proximal LAD LHC 12/16/17 nonobstructive CAD, prior stent in proximal LAD widely patent, mild global LV dysfunction LVEF 45-50%, mildly elevated LVEDP 01/08/19 right and left heart cath non-stenotic prox LAD with patent stent, prox LAD-2 lesion is 50% stenosied, 1st marg lesion is 80% stenosed, prox RCA to mid RCA lesion 99% stenosed, mid RCA lesion 30% stenosed, ost 1st marg lesion 90% stenosed, ost LAD to prox LAD 90%, mid LAD to dist LAD 60%. 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  Given morbid obesity and diffuse distal LAD disease not a candidate for CABG  01/12/19  2 vessel PCI with DES mid-LAD, PTCA of severe ISR of prox LAD, and stenting with DES of prox RCA  He was last seen in our office 07/23/2021 by Dr. Elease HashimotoNahser at which time he was noncompliant with some of his cardiac medications. Advised to resume cardiac medications, scheduled for 3 month follow-up of lipids, and return in 1 year for office visit.   Admission 2/22-2/24/23 for  atrial fibrillation with RVR.  Elevated troponins felt to be due to demand ischemia. Plan to continue Plavix and Eliquis. Limited echo revealed LVEF 50-55%, moderate  LVH, indeterminate diastolic parameters, normal RV function  Today, he is here   Refills  Past Medical History:  Diagnosis Date   Anemia    Arthritis    Asthma    Bone spur of ankle    right    CHF (congestive heart failure) (HCC)    COPD (chronic obstructive pulmonary disease) (HCC)    Coronary artery disease    Depression    Diabetes (HCC)    borderline    GERD (gastroesophageal reflux disease)    Hyperlipidemia    Hypertension    Myocardial infarction (HCC)    Pneumonia    HX OF PNA   Right rotator cuff tear 01/06/2018   Shortness of breath dyspnea    Sleep apnea    USES CPAP    Past Surgical History:  Procedure Laterality Date   CARDIAC CATHETERIZATION  12/22/2014   CORONARY ANGIOPLASTY     CORONARY STENT INTERVENTION N/A 01/12/2019   Procedure: CORONARY STENT INTERVENTION;  Surgeon: Tonny Bollmanooper, Michael, MD;  [facility];  Service: Cardiovascular;  Laterality: N/A;   CORONARY STENT INTERVENTION N/A 06/26/2021   Procedure: CORONARY STENT INTERVENTION;  Surgeon: Marykay LexHarding, David W, MD;  [facility];  Service: Cardiovascular;  Laterality: N/A;   CORONARY STENT PLACEMENT  12/22/2014   LAD   KNEE SURGERY     LEFT HEART CATH N/A 01/12/2019   Procedure: Left Heart Cath;  Surgeon: Tonny Bollmanooper, Michael, MD;  [facility];  Service: Cardiovascular;  Laterality: N/A;   LEFT HEART CATH AND CORONARY ANGIOGRAPHY N/A 12/16/2017   Procedure: LEFT HEART CATH AND CORONARY ANGIOGRAPHY;  Surgeon: Martinique, Peter M, MD;  Location: [facility] CV LAB;  Service: Cardiovascular;  Laterality: N/A;   LEFT HEART CATH AND CORONARY ANGIOGRAPHY N/A 06/26/2021   Procedure: LEFT HEART CATH AND CORONARY ANGIOGRAPHY;  Surgeon: Leonie Man, MD;  Location: [place] CV LAB;  Service: Cardiovascular;  Laterality: N/A;   LEFT HEART CATHETERIZATION WITH CORONARY ANGIOGRAM N/A 12/22/2014   Procedure: LEFT HEART CATHETERIZATION WITH CORONARY ANGIOGRAM;  Surgeon:  Jettie Booze, MD;  Location: [facility] CATH LAB;  Service: Cardiovascular;  Laterality: N/A;   RIGHT/LEFT HEART CATH AND CORONARY ANGIOGRAPHY N/A 01/08/2019   Procedure: RIGHT/LEFT HEART CATH AND CORONARY ANGIOGRAPHY;  Surgeon: Jolaine Artist, MD;  Location: [place] CV LAB;  Service: Cardiovascular;  Laterality: N/A;   SHOULDER ARTHROSCOPY WITH ROTATOR CUFF REPAIR AND SUBACROMIAL DECOMPRESSION Right 01/06/2018   Procedure: RIGHT SHOULDER ARTHROSCOPY  DEBRIDEMENT,ACROMIOPLASTY, ROTATOR CUFF REPAIR;  Surgeon: Marchia Bond, MD;  Location: [place];  Service: Orthopedics;  Laterality: Right;    Current Medications: No outpatient medications have been marked as taking for the 01/22/22 encounter (Appointment) with Ann Maki, Lanice Schwab, NP.     Allergies:   Patient has no known allergies.   Social History   Socioeconomic History   Marital status: Legally Separated    Spouse name: Not on file   Number of children: Not on file   Years of education: Not on file   Highest education level: Not on file  Occupational History   Not on file  Tobacco Use   Smoking status: Every Day    Packs/day: 1.00    Years: 30.00    Pack years: 30.00    Types: Cigarettes   Smokeless tobacco: Never   Tobacco comments:    " I have  tried vapor cigarettes"  Vaping Use   Vaping Use: Former  Substance and Sexual Activity   Alcohol use: Yes    Alcohol/week: 0.0 standard drinks    Comment: occ beer    Drug use: Yes    Types: Cocaine   Sexual activity: Not on file  Other Topics Concern   Not on file  Social History Narrative   Not on file   Social Determinants of Health   Financial Resource Strain: Not on file  Food Insecurity: Not on file  Transportation Needs: Not on file  Physical Activity: Not on file  Stress: Not on file  Social Connections: Not on file     Family History: The patient's ***family history includes Cancer in his mother and sister; Diabetes in his father; Heart attack in his  father; Hyperlipidemia in his mother; Hypertension in his mother. There is no history of Colon cancer.  ROS:   Please see the history of present illness.    *** All other systems reviewed and are negative.  Labs/Other Studies Reviewed:    The following studies were reviewed today:  Echo 11/15/21  LHC 06/26/21  Echo 06/24/21  Coronary stent intervention 01/12/19  Right/Left HC 01/08/19  LHC 12/16/17    Recent Labs: 11/14/2021: B Natriuretic Peptide 85.0 11/15/2021: ALT 19; Hemoglobin 13.9; Magnesium 1.9; Platelets 273 11/16/2021: BUN 13; Creatinine, Ser 0.96; Potassium 3.7; Sodium 137; TSH 1.065  Recent Lipid Panel    Component Value Date/Time   CHOL 188 06/24/2021 0422   CHOL 117 12/09/2017 0812   TRIG 125 06/24/2021 0422   HDL 35 (L) 06/24/2021  0422   HDL 35 (L) 12/09/2017 0812   CHOLHDL 5.4 06/24/2021 0422   VLDL 25 06/24/2021 0422   LDLCALC 128 (H) 06/24/2021 0422   LDLCALC 67 12/09/2017 0812     Risk Assessment/Calculations:   {Does this patient have ATRIAL FIBRILLATION?[phone]       Physical Exam:    VS:  There were no vitals taken for this visit.    Wt Readings from Last 3 Encounters:  11/15/21 (!) 369 lb 7.9 oz (167.6 kg)  07/23/21 (!) 368 lb 6.4 oz (167.1 kg)  07/12/21 (!) 369 lb 8 oz (167.6 kg)     GEN: *** Well nourished, well developed in no acute distress HEENT: Normal NECK: No JVD; No carotid bruits CARDIAC: ***RRR, no murmurs, rubs, gallops RESPIRATORY:  Clear to auscultation without rales, wheezing or rhonchi  ABDOMEN: Soft, non-tender, non-distended MUSCULOSKELETAL:  No edema; No deformity. *** pedal pulses, ***bilaterally SKIN: Warm and dry NEUROLOGIC:  Alert and oriented x 3 PSYCHIATRIC:  Normal affect   EKG:  EKG is *** ordered today.  The ekg ordered today demonstrates ***  Diagnoses:    No diagnosis found. Assessment and Plan:     ***          {Are you ordering a CV Procedure (e.g. stress test, cath, DCCV, TEE, etc)?    Press F2        :[id number]    Medication Adjustments/Labs and Tests Ordered: Current medicines are reviewed at length with the patient today.  Concerns regarding medicines are outlined above.  No orders of the defined types were placed in this encounter.  No orders of the defined types were placed in this encounter.   There are no Patient Instructions on file for this visit.   Signed, Emmaline Life, NP  01/20/2022 2:27 PM     [facility]

## 2022-01-22 ENCOUNTER — Ambulatory Visit: Payer: Medicaid Other | Admitting: Nurse Practitioner

## 2022-02-15 ENCOUNTER — Encounter: Payer: Medicaid Other | Admitting: Physical Medicine & Rehabilitation

## 2022-02-20 ENCOUNTER — Ambulatory Visit: Payer: Medicaid Other | Admitting: Physician Assistant

## 2022-03-11 ENCOUNTER — Ambulatory Visit: Payer: Medicaid Other | Admitting: Physician Assistant

## 2022-03-23 DEATH — deceased

## 2022-03-29 ENCOUNTER — Ambulatory Visit: Payer: Medicaid Other | Admitting: Physician Assistant
